# Patient Record
Sex: Male | Born: 1935 | Race: White | Hispanic: No | Marital: Married | Smoking: Former smoker
Health system: Southern US, Community
[De-identification: ages and names within clinical notes are randomized; demographics above are authoritative.]

## PROBLEM LIST (undated history)

## (undated) DIAGNOSIS — I82409 Acute embolism and thrombosis of unspecified deep veins of unspecified lower extremity: Secondary | ICD-10-CM

## (undated) DIAGNOSIS — J449 Chronic obstructive pulmonary disease, unspecified: Secondary | ICD-10-CM

## (undated) DIAGNOSIS — I739 Peripheral vascular disease, unspecified: Secondary | ICD-10-CM

## (undated) DIAGNOSIS — I10 Essential (primary) hypertension: Secondary | ICD-10-CM

## (undated) DIAGNOSIS — I2510 Atherosclerotic heart disease of native coronary artery without angina pectoris: Secondary | ICD-10-CM

## (undated) DIAGNOSIS — I5032 Chronic diastolic (congestive) heart failure: Secondary | ICD-10-CM

## (undated) DIAGNOSIS — N183 Chronic kidney disease, stage 3 unspecified: Secondary | ICD-10-CM

## (undated) DIAGNOSIS — E119 Type 2 diabetes mellitus without complications: Secondary | ICD-10-CM

## (undated) DIAGNOSIS — I639 Cerebral infarction, unspecified: Secondary | ICD-10-CM

## (undated) DIAGNOSIS — I4891 Unspecified atrial fibrillation: Secondary | ICD-10-CM

## (undated) DIAGNOSIS — K219 Gastro-esophageal reflux disease without esophagitis: Secondary | ICD-10-CM

## (undated) HISTORY — PX: PAROTID GLAND TUMOR EXCISION: SHX5221

## (undated) HISTORY — PX: CAROTID STENT: SHX1301

---

## 2013-11-23 ENCOUNTER — Emergency Department (HOSPITAL_COMMUNITY): Payer: Medicare HMO

## 2013-11-23 ENCOUNTER — Emergency Department (HOSPITAL_COMMUNITY): Payer: Medicare HMO | Admitting: Anesthesiology

## 2013-11-23 ENCOUNTER — Encounter (HOSPITAL_COMMUNITY)
Admission: EM | Disposition: A | Discharge status: Skilled Nursing Facility | Payer: Self-pay | Source: Home / Self Care | Attending: Internal Medicine

## 2013-11-23 ENCOUNTER — Inpatient Hospital Stay (HOSPITAL_COMMUNITY)
Admission: EM | Admit: 2013-11-23 | Discharge: 2013-12-05 | DRG: 252 | Disposition: A | Discharge status: Skilled Nursing Facility | Payer: Medicare HMO | Attending: Internal Medicine | Admitting: Internal Medicine

## 2013-11-23 ENCOUNTER — Encounter (HOSPITAL_COMMUNITY): Payer: Self-pay | Admitting: Emergency Medicine

## 2013-11-23 ENCOUNTER — Encounter (HOSPITAL_COMMUNITY): Payer: Medicare HMO | Admitting: Anesthesiology

## 2013-11-23 DIAGNOSIS — J810 Acute pulmonary edema: Secondary | ICD-10-CM

## 2013-11-23 DIAGNOSIS — M79604 Pain in right leg: Secondary | ICD-10-CM

## 2013-11-23 DIAGNOSIS — E669 Obesity, unspecified: Secondary | ICD-10-CM | POA: Diagnosis present

## 2013-11-23 DIAGNOSIS — I69959 Hemiplegia and hemiparesis following unspecified cerebrovascular disease affecting unspecified side: Secondary | ICD-10-CM

## 2013-11-23 DIAGNOSIS — E872 Acidosis, unspecified: Secondary | ICD-10-CM | POA: Diagnosis not present

## 2013-11-23 DIAGNOSIS — K219 Gastro-esophageal reflux disease without esophagitis: Secondary | ICD-10-CM | POA: Diagnosis present

## 2013-11-23 DIAGNOSIS — I5032 Chronic diastolic (congestive) heart failure: Secondary | ICD-10-CM

## 2013-11-23 DIAGNOSIS — I4821 Permanent atrial fibrillation: Secondary | ICD-10-CM

## 2013-11-23 DIAGNOSIS — I498 Other specified cardiac arrhythmias: Secondary | ICD-10-CM | POA: Diagnosis present

## 2013-11-23 DIAGNOSIS — I70229 Atherosclerosis of native arteries of extremities with rest pain, unspecified extremity: Secondary | ICD-10-CM | POA: Diagnosis present

## 2013-11-23 DIAGNOSIS — J4489 Other specified chronic obstructive pulmonary disease: Secondary | ICD-10-CM | POA: Diagnosis present

## 2013-11-23 DIAGNOSIS — J9601 Acute respiratory failure with hypoxia: Secondary | ICD-10-CM

## 2013-11-23 DIAGNOSIS — Z79899 Other long term (current) drug therapy: Secondary | ICD-10-CM

## 2013-11-23 DIAGNOSIS — R5381 Other malaise: Secondary | ICD-10-CM | POA: Diagnosis not present

## 2013-11-23 DIAGNOSIS — B369 Superficial mycosis, unspecified: Secondary | ICD-10-CM | POA: Diagnosis present

## 2013-11-23 DIAGNOSIS — J9620 Acute and chronic respiratory failure, unspecified whether with hypoxia or hypercapnia: Secondary | ICD-10-CM | POA: Diagnosis not present

## 2013-11-23 DIAGNOSIS — Z87891 Personal history of nicotine dependence: Secondary | ICD-10-CM

## 2013-11-23 DIAGNOSIS — Z7982 Long term (current) use of aspirin: Secondary | ICD-10-CM

## 2013-11-23 DIAGNOSIS — I959 Hypotension, unspecified: Secondary | ICD-10-CM

## 2013-11-23 DIAGNOSIS — I2510 Atherosclerotic heart disease of native coronary artery without angina pectoris: Secondary | ICD-10-CM | POA: Diagnosis present

## 2013-11-23 DIAGNOSIS — E118 Type 2 diabetes mellitus with unspecified complications: Secondary | ICD-10-CM | POA: Diagnosis present

## 2013-11-23 DIAGNOSIS — R066 Hiccough: Secondary | ICD-10-CM | POA: Diagnosis not present

## 2013-11-23 DIAGNOSIS — I4891 Unspecified atrial fibrillation: Secondary | ICD-10-CM

## 2013-11-23 DIAGNOSIS — J449 Chronic obstructive pulmonary disease, unspecified: Secondary | ICD-10-CM | POA: Diagnosis present

## 2013-11-23 DIAGNOSIS — I743 Embolism and thrombosis of arteries of the lower extremities: Principal | ICD-10-CM | POA: Diagnosis present

## 2013-11-23 DIAGNOSIS — N170 Acute kidney failure with tubular necrosis: Secondary | ICD-10-CM | POA: Diagnosis not present

## 2013-11-23 DIAGNOSIS — K5900 Constipation, unspecified: Secondary | ICD-10-CM | POA: Diagnosis not present

## 2013-11-23 DIAGNOSIS — E119 Type 2 diabetes mellitus without complications: Secondary | ICD-10-CM | POA: Diagnosis present

## 2013-11-23 DIAGNOSIS — R339 Retention of urine, unspecified: Secondary | ICD-10-CM | POA: Diagnosis not present

## 2013-11-23 DIAGNOSIS — I10 Essential (primary) hypertension: Secondary | ICD-10-CM | POA: Diagnosis present

## 2013-11-23 DIAGNOSIS — I359 Nonrheumatic aortic valve disorder, unspecified: Secondary | ICD-10-CM | POA: Diagnosis present

## 2013-11-23 DIAGNOSIS — I998 Other disorder of circulatory system: Secondary | ICD-10-CM

## 2013-11-23 DIAGNOSIS — Z8673 Personal history of transient ischemic attack (TIA), and cerebral infarction without residual deficits: Secondary | ICD-10-CM

## 2013-11-23 DIAGNOSIS — Z6833 Body mass index (BMI) 33.0-33.9, adult: Secondary | ICD-10-CM

## 2013-11-23 DIAGNOSIS — R319 Hematuria, unspecified: Secondary | ICD-10-CM | POA: Diagnosis not present

## 2013-11-23 DIAGNOSIS — I999 Unspecified disorder of circulatory system: Secondary | ICD-10-CM

## 2013-11-23 DIAGNOSIS — I5033 Acute on chronic diastolic (congestive) heart failure: Secondary | ICD-10-CM | POA: Diagnosis not present

## 2013-11-23 DIAGNOSIS — R579 Shock, unspecified: Secondary | ICD-10-CM | POA: Diagnosis not present

## 2013-11-23 HISTORY — DX: Gastro-esophageal reflux disease without esophagitis: K21.9

## 2013-11-23 HISTORY — DX: Type 2 diabetes mellitus without complications: E11.9

## 2013-11-23 HISTORY — PX: EMBOLECTOMY: SHX44

## 2013-11-23 HISTORY — DX: Essential (primary) hypertension: I10

## 2013-11-23 HISTORY — DX: Unspecified atrial fibrillation: I48.91

## 2013-11-23 HISTORY — DX: Cerebral infarction, unspecified: I63.9

## 2013-11-23 HISTORY — DX: Chronic obstructive pulmonary disease, unspecified: J44.9

## 2013-11-23 LAB — GLUCOSE, CAPILLARY: Glucose-Capillary: 57 mg/dL — ABNORMAL LOW (ref 70–99)

## 2013-11-23 SURGERY — EMBOLECTOMY
Anesthesia: General | Site: Groin | Laterality: Right

## 2013-11-23 MED ORDER — DILTIAZEM LOAD VIA INFUSION
10.0000 mg | Freq: Once | INTRAVENOUS | Status: AC
  Administered 2013-11-24: 10 mg via INTRAVENOUS
  Filled 2013-11-23: qty 10

## 2013-11-23 MED ORDER — DILTIAZEM HCL 100 MG IV SOLR
5.0000 mg/h | INTRAVENOUS | Status: DC
  Administered 2013-11-24: 10 mg/h via INTRAVENOUS
  Filled 2013-11-23: qty 100

## 2013-11-23 MED ORDER — DEXTROSE 50 % IV SOLN
INTRAVENOUS | Status: DC | PRN
  Administered 2013-11-23: .5 via INTRAVENOUS

## 2013-11-23 MED ORDER — SUCCINYLCHOLINE CHLORIDE 20 MG/ML IJ SOLN
INTRAMUSCULAR | Status: DC | PRN
  Administered 2013-11-23: 130 mg via INTRAVENOUS

## 2013-11-23 MED ORDER — DEXTROSE 50 % IV SOLN
INTRAVENOUS | Status: AC
  Filled 2013-11-23: qty 50

## 2013-11-23 MED ORDER — PROPOFOL 10 MG/ML IV BOLUS
INTRAVENOUS | Status: DC | PRN
  Administered 2013-11-23: 100 mg via INTRAVENOUS

## 2013-11-23 MED ORDER — SODIUM CHLORIDE 0.9 % IR SOLN
Status: DC | PRN
  Administered 2013-11-23: 23:00:00

## 2013-11-23 MED ORDER — CEFAZOLIN SODIUM-DEXTROSE 2-3 GM-% IV SOLR
INTRAVENOUS | Status: DC | PRN
  Administered 2013-11-23: 2 g via INTRAVENOUS

## 2013-11-23 MED ORDER — CEFAZOLIN SODIUM-DEXTROSE 2-3 GM-% IV SOLR
INTRAVENOUS | Status: AC
  Filled 2013-11-23: qty 50

## 2013-11-23 MED ORDER — IPRATROPIUM-ALBUTEROL 20-100 MCG/ACT IN AERS
INHALATION_SPRAY | RESPIRATORY_TRACT | Status: DC | PRN
  Administered 2013-11-23: 4 via RESPIRATORY_TRACT

## 2013-11-23 MED ORDER — 0.9 % SODIUM CHLORIDE (POUR BTL) OPTIME
TOPICAL | Status: DC | PRN
  Administered 2013-11-23: 1000 mL

## 2013-11-23 MED ORDER — HEPARIN SODIUM (PORCINE) 1000 UNIT/ML IJ SOLN
INTRAMUSCULAR | Status: DC | PRN
  Administered 2013-11-23: 8000 [IU] via INTRAVENOUS

## 2013-11-23 MED ORDER — ROCURONIUM BROMIDE 100 MG/10ML IV SOLN
INTRAVENOUS | Status: DC | PRN
  Administered 2013-11-23 – 2013-11-24 (×2): 25 mg via INTRAVENOUS

## 2013-11-23 MED ORDER — SODIUM CHLORIDE 0.9 % IV SOLN
10.0000 mg | INTRAVENOUS | Status: DC | PRN
  Administered 2013-11-23: 25 ug/min via INTRAVENOUS

## 2013-11-23 MED ORDER — LACTATED RINGERS IV SOLN
INTRAVENOUS | Status: DC | PRN
  Administered 2013-11-23: 23:00:00 via INTRAVENOUS

## 2013-11-23 MED ORDER — LIDOCAINE HCL (CARDIAC) 20 MG/ML IV SOLN
INTRAVENOUS | Status: DC | PRN
  Administered 2013-11-23: 80 mg via INTRAVENOUS

## 2013-11-23 MED ORDER — MIDAZOLAM HCL 5 MG/5ML IJ SOLN
INTRAMUSCULAR | Status: DC | PRN
  Administered 2013-11-23 – 2013-11-24 (×2): 1 mg via INTRAVENOUS

## 2013-11-23 MED ORDER — FENTANYL CITRATE 0.05 MG/ML IJ SOLN
INTRAMUSCULAR | Status: DC | PRN
  Administered 2013-11-23: 50 ug via INTRAVENOUS

## 2013-11-23 MED ORDER — THROMBIN 20000 UNITS EX SOLR
CUTANEOUS | Status: AC
  Filled 2013-11-23: qty 20000

## 2013-11-23 SURGICAL SUPPLY — 58 items
BANDAGE ESMARK 6X9 LF (GAUZE/BANDAGES/DRESSINGS) IMPLANT
BNDG ESMARK 6X9 LF (GAUZE/BANDAGES/DRESSINGS)
CANISTER SUCTION 2500CC (MISCELLANEOUS) ×2 IMPLANT
CATH EMB 3FR 80CM (CATHETERS) IMPLANT
CATH EMB 4FR 80CM (CATHETERS) ×2 IMPLANT
CATH EMB 5FR 80CM (CATHETERS) IMPLANT
CLIP TI MEDIUM 24 (CLIP) ×2 IMPLANT
CLIP TI WIDE RED SMALL 24 (CLIP) ×2 IMPLANT
COVER SURGICAL LIGHT HANDLE (MISCELLANEOUS) ×2 IMPLANT
CUFF TOURNIQUET SINGLE 18IN (TOURNIQUET CUFF) IMPLANT
CUFF TOURNIQUET SINGLE 24IN (TOURNIQUET CUFF) IMPLANT
CUFF TOURNIQUET SINGLE 34IN LL (TOURNIQUET CUFF) IMPLANT
CUFF TOURNIQUET SINGLE 44IN (TOURNIQUET CUFF) IMPLANT
DERMABOND ADHESIVE PROPEN (GAUZE/BANDAGES/DRESSINGS) ×1
DERMABOND ADVANCED (GAUZE/BANDAGES/DRESSINGS) ×1
DERMABOND ADVANCED .7 DNX12 (GAUZE/BANDAGES/DRESSINGS) ×1 IMPLANT
DERMABOND ADVANCED .7 DNX6 (GAUZE/BANDAGES/DRESSINGS) ×1 IMPLANT
DRAIN CHANNEL 15F RND FF W/TCR (WOUND CARE) ×2 IMPLANT
DRAPE WARM FLUID 44X44 (DRAPE) ×2 IMPLANT
DRAPE X-RAY CASS 24X20 (DRAPES) IMPLANT
DRESSING ALLEVYN LIFE SACRUM (GAUZE/BANDAGES/DRESSINGS) ×2 IMPLANT
ELECT REM PT RETURN 9FT ADLT (ELECTROSURGICAL) ×2
ELECTRODE REM PT RTRN 9FT ADLT (ELECTROSURGICAL) ×1 IMPLANT
EVACUATOR SILICONE 100CC (DRAIN) ×2 IMPLANT
GLOVE BIO SURGEON STRL SZ7.5 (GLOVE) ×6 IMPLANT
GLOVE BIOGEL PI IND STRL 6.5 (GLOVE) ×3 IMPLANT
GLOVE BIOGEL PI IND STRL 7.5 (GLOVE) ×2 IMPLANT
GLOVE BIOGEL PI IND STRL 8 (GLOVE) ×1 IMPLANT
GLOVE BIOGEL PI INDICATOR 6.5 (GLOVE) ×3
GLOVE BIOGEL PI INDICATOR 7.5 (GLOVE) ×2
GLOVE BIOGEL PI INDICATOR 8 (GLOVE) ×1
GOWN STRL NON-REIN LRG LVL3 (GOWN DISPOSABLE) ×6 IMPLANT
KIT BASIN OR (CUSTOM PROCEDURE TRAY) ×2 IMPLANT
KIT ROOM TURNOVER OR (KITS) ×2 IMPLANT
NS IRRIG 1000ML POUR BTL (IV SOLUTION) ×4 IMPLANT
PACK PERIPHERAL VASCULAR (CUSTOM PROCEDURE TRAY) ×2 IMPLANT
PAD ARMBOARD 7.5X6 YLW CONV (MISCELLANEOUS) ×4 IMPLANT
PATCH VASCULAR VASCU GUARD 1X6 (Vascular Products) ×2 IMPLANT
SET COLLECT BLD 21X3/4 12 (NEEDLE) IMPLANT
SPONGE GAUZE 4X4 12PLY (GAUZE/BANDAGES/DRESSINGS) ×2 IMPLANT
SPONGE SURGIFOAM ABS GEL 100 (HEMOSTASIS) IMPLANT
STAPLER VISISTAT 35W (STAPLE) IMPLANT
STOPCOCK 4 WAY LG BORE MALE ST (IV SETS) IMPLANT
SUT ETHILON 3 0 PS 1 (SUTURE) ×2 IMPLANT
SUT PROLENE 5 0 C 1 24 (SUTURE) ×8 IMPLANT
SUT PROLENE 6 0 BV (SUTURE) IMPLANT
SUT VIC AB 2-0 CTB1 (SUTURE) ×4 IMPLANT
SUT VIC AB 3-0 SH 27 (SUTURE) ×1
SUT VIC AB 3-0 SH 27X BRD (SUTURE) ×1 IMPLANT
SUT VICRYL 4-0 PS2 18IN ABS (SUTURE) ×2 IMPLANT
SYR 3ML LL SCALE MARK (SYRINGE) ×2 IMPLANT
TAPE CLOTH SURG 4X10 WHT LF (GAUZE/BANDAGES/DRESSINGS) ×2 IMPLANT
TOWEL OR 17X24 6PK STRL BLUE (TOWEL DISPOSABLE) ×4 IMPLANT
TOWEL OR 17X26 10 PK STRL BLUE (TOWEL DISPOSABLE) ×2 IMPLANT
TRAY FOLEY CATH 16FRSI W/METER (SET/KITS/TRAYS/PACK) ×2 IMPLANT
TUBING EXTENTION W/L.L. (IV SETS) IMPLANT
UNDERPAD 30X30 INCONTINENT (UNDERPADS AND DIAPERS) ×2 IMPLANT
WATER STERILE IRR 1000ML POUR (IV SOLUTION) ×2 IMPLANT

## 2013-11-23 NOTE — Anesthesia Preprocedure Evaluation (Addendum)
Anesthesia Evaluation  Patient identified by MRN, date of birth, ID band Patient awake    Reviewed: Allergy & Precautions, H&P , NPO status , Patient's Chart, lab work & pertinent test results  Airway Mallampati: II TM Distance: >3 FB Neck ROM: Full    Dental   Pulmonary COPDformer smoker,  + rhonchi   + wheezing      Cardiovascular hypertension, + Peripheral Vascular Disease + dysrhythmias Atrial Fibrillation Rhythm:Irregular Rate:Tachycardia     Neuro/Psych CVA, Residual Symptoms    GI/Hepatic GERD-  ,  Endo/Other  diabetes  Renal/GU      Musculoskeletal   Abdominal (+) + obese,   Peds  Hematology   Anesthesia Other Findings   Reproductive/Obstetrics                          Anesthesia Physical Anesthesia Plan  ASA: IV and emergent  Anesthesia Plan: General   Post-op Pain Management:    Induction: Intravenous, Rapid sequence and Cricoid pressure planned  Airway Management Planned: Oral ETT  Additional Equipment:   Intra-op Plan:   Post-operative Plan: Extubation in OR and Possible Post-op intubation/ventilation  Informed Consent: I have reviewed the patients History and Physical, chart, labs and discussed the procedure including the risks, benefits and alternatives for the proposed anesthesia with the patient or authorized representative who has indicated his/her understanding and acceptance.     Plan Discussed with: CRNA and Surgeon  Anesthesia Plan Comments:        Anesthesia Quick Evaluation

## 2013-11-23 NOTE — ED Notes (Signed)
Dr. Dickson at bedside.

## 2013-11-23 NOTE — H&P (Signed)
Vascular and Vein Specialist of [facility]  Patient name: [name] [name] MRN: [id number] DOB: 14-Sep-1931 Sex: male  REASON FOR ADMISSION: Ischemic right lower extremity. The patient is transferred from [facility].  HPI: [name] [name] is a 79 y.o. male who developed the sudden onset of weakness and discoloration of his right lower extremity at 9:30 AM yesterday. His wife noted that the right leg was white from the knee down and that it was a weak and numb. Of note, the patient has a history of atrial fibrillation. He has not been on anticoagulation. He was admitted at [facility]. It was felt that he likely had a left brain stroke and he underwent an extensive workup including carotid duplex, CT, and MRI. The stroke workup was negative. Today he had an arterial Doppler study which showed no significant arterial flow in the right lower extremity. There was no flow noted in the right common femoral artery were superficial femoral artery. There was significant calcific disease in the superficial femoral artery. He also had a venous duplex scan which showed no evidence of DVT in the right lower extremity. Given that he likely had an acute arterial occlusion vascular surgery was consult the and he was transferred here for evaluation.  Prior to this admission he was ambulatory with a walker. He he admitted to some calf pain with ambulation which could be claudication. He also complained of some pain in his right foot at rest which could potentially represent rest pain. He was unaware of any previous history of nonhealing ulcers. He did have a previous stroke in 1997 which was apparently associated with left-sided weakness. He has undergone previous right carotid endarterectomy in another city and also previous right carotid stenting.  His risk factors for peripheral vascular disease include hypertension, diabetes, and a history of previous tobacco use.  Because of his atrial fibrillation, at the  referring institution he was started on Xarelto yesterday.  Past Medical History  Diagnosis Date  . Hypertension   . Stroke   . COPD (chronic obstructive pulmonary disease)   . Diabetes mellitus without complication   . GERD (gastroesophageal reflux disease)   . A-fib    FAMILY HISTORY: Patient denies any history of premature cardiovascular disease.  SOCIAL HISTORY: History  Substance Use Topics  . Smoking status: Former Games developer  . Smokeless tobacco: Not on file  . Alcohol Use: No   No Known Allergies  No current facility-administered medications for this encounter.   Current Outpatient Prescriptions  Medication Sig Dispense Refill  . aspirin EC 81 MG tablet Take 81 mg by mouth at bedtime.      Marland Kitchen atorvastatin (LIPITOR) 10 MG tablet Take 10 mg by mouth daily.      . benazepril (LOTENSIN) 10 MG tablet Take 10 mg by mouth 2 (two) times daily.      . furosemide (LASIX) 40 MG tablet Take 60 mg by mouth daily.      Marland Kitchen glipiZIDE (GLUCOTROL XL) 10 MG 24 hr tablet Take 10 mg by mouth 2 (two) times daily.      . metoprolol (LOPRESSOR) 50 MG tablet Take 50 mg by mouth 2 (two) times daily.      . potassium chloride SA (K-DUR,KLOR-CON) 20 MEQ tablet Take 20 mEq by mouth daily.      . ranitidine (ZANTAC) 150 MG tablet Take 150 mg by mouth 2 (two) times daily.      . sitaGLIPtin-metformin (JANUMET) 50-500 MG per tablet Take 1 tablet by mouth  2 (two) times daily with a meal.      . terazosin (HYTRIN) 5 MG capsule Take 5 mg by mouth every other day.       REVIEW OF SYSTEMS: Arly.Keller ] denotes positive finding; [  ] denotes negative finding CARDIOVASCULAR:  [ ]  chest pain   [ ]  chest pressure   [ ]  palpitations   [ ]  orthopnea   Arly.Keller ] dyspnea on exertion   Arly.Keller ] claudication   Arly.Keller ] rest pain   [ ]  DVT   [ ]  phlebitis PULMONARY:   [ ]  productive cough   [ ]  asthma   [ ]  wheezing NEUROLOGIC:   Arly.Keller ] weakness  Arly.Keller ] paresthesias  [ ]  aphasia  [ ]  amaurosis  [ ]  dizziness HEMATOLOGIC:   [ ]  bleeding problems    [ ]  clotting disorders MUSCULOSKELETAL:  Arly.Keller ] joint pain   [ ]  joint swelling [ ]  leg swelling GASTROINTESTINAL: [ ]   blood in stool  [ ]   hematemesis GENITOURINARY:  [ ]   dysuria  [ ]   hematuria PSYCHIATRIC:  [ ]  history of major depression INTEGUMENTARY:  [ ]  rashes  [ ]  ulcers CONSTITUTIONAL:  [ ]  fever   [ ]  chills  PHYSICAL EXAM: Filed Vitals:   11/23/13 2050 11/23/13 2053  BP:  100/53  Pulse:  115  Temp:  97.5 F (36.4 C)  TempSrc:  Oral  Resp:  18  Height:  5\' 7"  (1.702 m)  Weight:  197 lb (89.359 kg)  SpO2: 98% 100%   Body mass index is 30.85 kg/(m^2). GENERAL: The patient is a well-nourished male, in no acute distress. The vital signs are documented above. CARDIOVASCULAR: There is a regular rate and rhythm. I do not detect carotid bruits. I cannot palpate a right femoral pulse. I cannot palpate a popliteal or pedal pulses on the right. I am unable to obtain Doppler signals in the right foot. The right foot is cool. On the left side, he has a palpable femoral pulse although somewhat diminished. He has a monophasic dorsalis pedis and posterior tibial signal on the left with the Doppler. He has bilateral lower extremity swelling. PULMONARY: There is good air exchange bilaterally without wheezing or rales. ABDOMEN: Soft and non-tender with normal pitched bowel sounds. He is significantly obese and it is difficult to assess for an aneurysm. MUSCULOSKELETAL: The right foot is cool and cyanotic. NEUROLOGIC:He has decreased motor and sensory function of the right lower extremity. He has left sided weakness related to his previous stroke. SKIN: He does have some fungal rash in both groins. PSYCHIATRIC: The patient has a normal affect.  DATA:  RIGHT LOWER EXTREMITY ARTERIAL DUPLEX: This shows no significant arterial flow in the right lower extremity. There is no flow identified in the right common femoral artery, superficial femoral artery, or popliteal artery. There is calcific  disease within the superficial femoral artery.  VENOUS DUPLEX: There is no evidence of right lower extremity DVT.  CAROTID DUPLEX SCAN: There is a less than 50% carotid stenosis bilaterally.  Labs at the referring institution show white blood cell count of 16.1. Hemoglobin 12.6 hematocrit 39.3. Platelets 175,000. INR 1.2. PTT 23.6. Sodium was 138, potassium 4.9, creatinine 2.1. GFR 31 mL per minute.  Chest x-ray at the referring institution showed atelectasis bilaterally. He also had cardiomegaly and pulmonary venous congestion without frank evidence of edema.  EKG at [facility] showed ectopic atrial tachycardia with an undetermined rhythm which was irregular.  CT of the head at [facility] on 11/22/2013 showed diffuse cortical atrophy. There is no acute intracranial abnormality seen.  MRI of the head on 11/22/2013 at [facility] showed no acute abnormality.  MEDICAL ISSUES:  ACUTE ARTERIAL OCCLUSION OF THE RIGHT LOWER EXTREMITY: This patient developed the sudden onset of discoloration and weakness in the right lower extremity. His workup for stroke was negative. Duplex showed no significant arterial flow in the right lower extremity. Given his history of atrial fibrillation certainly an embolic event is on the differential diagnosis. However he has multiple risk factors for peripheral vascular disease and may have underlying significant peripheral vascular disease. Given the severity of ischemia which is been present since 9:30 AM yesterday, I have recommended emergent right femoral embolectomy. If this becomes more than a simple embolectomy of the right femoral artery, then I would be reluctant to consider intraoperative arteriogram given his renal insufficiency and the risk of worsening his renal function. He may have significant diffuse atherosclerotic disease in which case the limb may not be salvageable. He certainly is at increased risk for surgery given his age and  multiple medical comorbidities. Or I recommended that we attempt simple thrombectomy as the best chance for limb salvage with the least amount of risk. I discussed indications for the procedure and the potential complications including but not limited to, bleeding, wound healing problems, and it not being able to restore perfusion to the right foot. I have explained that this is clearly a limb threatening situation. He will need an echo postoperatively if this appears to be an embolic. He will have to be considered for long-term anticoagulation if this is considered to be embolic. Given his multiple medical comorbidities he will need to be seen by [facility] postoperatively.  Eliza Green S [facility] [place] [facility]: [phone]

## 2013-11-23 NOTE — Anesthesia Procedure Notes (Signed)
Procedure Name: Intubation Date/Time: 11/23/2013 10:52 PM Performed by: Molli Hazard Pre-anesthesia Checklist: Patient identified, Emergency Drugs available, Suction available, Patient being monitored and Timeout performed Patient Re-evaluated:Patient Re-evaluated prior to inductionOxygen Delivery Method: Circle system utilized Preoxygenation: Pre-oxygenation with 100% oxygen Intubation Type: IV induction, Rapid sequence and Cricoid Pressure applied Laryngoscope Size: Miller and 2 Grade View: Grade I Tube type: Oral Tube size: 7.5 mm Number of attempts: 1 Airway Equipment and Method: Stylet Placement Confirmation: ETT inserted through vocal cords under direct vision,  positive ETCO2 and breath sounds checked- equal and bilateral Secured at: 23 cm Tube secured with: Tape Dental Injury: Teeth and Oropharynx as per pre-operative assessment

## 2013-11-23 NOTE — ED Notes (Signed)
PORT CXR at bedside

## 2013-11-23 NOTE — ED Notes (Signed)
Pt reports that prior to hospital admission he would have cramps in his R leg at night when he would lay down.

## 2013-11-23 NOTE — ED Notes (Addendum)
Pt transferred from [place] hospital. Was being tx due to R lower extremity being pale and unable to move. Had stroke work up which was negative. Hx past stroke 17 years ago with L sided deficits.  Pt family also reports that he would turn blue in the ears. Pt transferred for possible surgery. Facility also reports abdominal distention over last day.

## 2013-11-24 ENCOUNTER — Inpatient Hospital Stay (HOSPITAL_COMMUNITY): Payer: Medicare HMO

## 2013-11-24 ENCOUNTER — Encounter (HOSPITAL_COMMUNITY): Payer: Self-pay | Admitting: Vascular Surgery

## 2013-11-24 DIAGNOSIS — I743 Embolism and thrombosis of arteries of the lower extremities: Secondary | ICD-10-CM

## 2013-11-24 DIAGNOSIS — I998 Other disorder of circulatory system: Secondary | ICD-10-CM

## 2013-11-24 DIAGNOSIS — I959 Hypotension, unspecified: Secondary | ICD-10-CM

## 2013-11-24 DIAGNOSIS — I4821 Permanent atrial fibrillation: Secondary | ICD-10-CM

## 2013-11-24 DIAGNOSIS — J9600 Acute respiratory failure, unspecified whether with hypoxia or hypercapnia: Secondary | ICD-10-CM

## 2013-11-24 DIAGNOSIS — J9601 Acute respiratory failure with hypoxia: Secondary | ICD-10-CM

## 2013-11-24 DIAGNOSIS — I4891 Unspecified atrial fibrillation: Secondary | ICD-10-CM

## 2013-11-24 LAB — MRSA PCR SCREENING: MRSA by PCR: NEGATIVE

## 2013-11-24 LAB — POCT I-STAT 3, ART BLOOD GAS (G3+)
Acid-base deficit: 3 mmol/L — ABNORMAL HIGH (ref 0.0–2.0)
Bicarbonate: 24.3 mEq/L — ABNORMAL HIGH (ref 20.0–24.0)
Bicarbonate: 22.9 mEq/L (ref 20.0–24.0)
O2 Saturation: 99 %
Patient temperature: 97.5
TCO2: 26 mmol/L (ref 0–100)
TCO2: 24 mmol/L (ref 0–100)
pCO2 arterial: 49.8 mmHg — ABNORMAL HIGH (ref 35.0–45.0)
pCO2 arterial: 38.1 mmHg (ref 35.0–45.0)
pH, Arterial: 7.384 (ref 7.350–7.450)
pO2, Arterial: 112 mmHg — ABNORMAL HIGH (ref 80.0–100.0)

## 2013-11-24 LAB — POCT I-STAT GLUCOSE
Glucose, Bld: 48 mg/dL — ABNORMAL LOW (ref 70–99)
Operator id: 118551

## 2013-11-24 LAB — CBC
Hemoglobin: 11.3 g/dL — ABNORMAL LOW (ref 13.0–17.0)
MCH: 31.7 pg (ref 26.0–34.0)
MCV: 96.4 fL (ref 78.0–100.0)
RBC: 3.57 MIL/uL — ABNORMAL LOW (ref 4.22–5.81)
RDW: 14.2 % (ref 11.5–15.5)
WBC: 7.9 10*3/uL (ref 4.0–10.5)

## 2013-11-24 LAB — GLUCOSE, CAPILLARY
Glucose-Capillary: 78 mg/dL (ref 70–99)
Glucose-Capillary: 125 mg/dL — ABNORMAL HIGH (ref 70–99)
Glucose-Capillary: 154 mg/dL — ABNORMAL HIGH (ref 70–99)
Glucose-Capillary: 95 mg/dL (ref 70–99)
Glucose-Capillary: 111 mg/dL — ABNORMAL HIGH (ref 70–99)
Glucose-Capillary: 140 mg/dL — ABNORMAL HIGH (ref 70–99)

## 2013-11-24 LAB — BASIC METABOLIC PANEL
CO2: 23 mEq/L (ref 19–32)
GFR calc non Af Amer: 32 mL/min — ABNORMAL LOW (ref >90)
Glucose, Bld: 213 mg/dL — ABNORMAL HIGH (ref 70–99)
Potassium: 4.2 mEq/L (ref 3.5–5.1)
Sodium: 139 mEq/L (ref 135–145)

## 2013-11-24 LAB — HEPARIN LEVEL (UNFRACTIONATED): Heparin Unfractionated: 1.26 IU/mL — ABNORMAL HIGH (ref 0.30–0.70)

## 2013-11-24 LAB — APTT
aPTT: 77 seconds — ABNORMAL HIGH (ref 24–37)
aPTT: 49 seconds — ABNORMAL HIGH (ref 24–37)

## 2013-11-24 MED ORDER — MIDAZOLAM HCL 2 MG/2ML IJ SOLN: 1.0000 mg | INTRAMUSCULAR | Status: DC | PRN

## 2013-11-24 MED ORDER — GUAIFENESIN-DM 100-10 MG/5ML PO SYRP
15.0000 mL | ORAL_SOLUTION | ORAL | Status: DC | PRN
  Filled 2013-11-24: qty 15

## 2013-11-24 MED ORDER — HEPARIN (PORCINE) IN NACL 100-0.45 UNIT/ML-% IJ SOLN
1900.0000 [IU]/h | INTRAMUSCULAR | Status: DC
  Administered 2013-11-24: 1150 [IU]/h via INTRAVENOUS
  Administered 2013-11-25: 1300 [IU]/h via INTRAVENOUS
  Administered 2013-11-27 (×2): 1600 [IU]/h via INTRAVENOUS
  Administered 2013-11-28: 1750 [IU]/h via INTRAVENOUS
  Administered 2013-11-28: 1900 [IU]/h via INTRAVENOUS
  Filled 2013-11-24 (×9): qty 250

## 2013-11-24 MED ORDER — DOPAMINE-DEXTROSE 3.2-5 MG/ML-% IV SOLN: 3.0000 ug/kg/min | INTRAVENOUS | Status: DC

## 2013-11-24 MED ORDER — ALUM & MAG HYDROXIDE-SIMETH 200-200-20 MG/5ML PO SUSP
15.0000 mL | ORAL | Status: DC | PRN
Start: 2013-11-24 — End: 2013-12-05
  Filled 2013-11-24: qty 30

## 2013-11-24 MED ORDER — INSULIN ASPART 100 UNIT/ML ~~LOC~~ SOLN
0.0000 [IU] | SUBCUTANEOUS | Status: DC
  Administered 2013-11-24: 2 [IU] via SUBCUTANEOUS

## 2013-11-24 MED ORDER — HYDRALAZINE HCL 20 MG/ML IJ SOLN: 10.0000 mg | INTRAMUSCULAR | Status: DC | PRN

## 2013-11-24 MED ORDER — METOPROLOL TARTRATE 1 MG/ML IV SOLN
2.5000 mg | INTRAVENOUS | Status: DC | PRN
  Administered 2013-11-24 (×2): 5 mg via INTRAVENOUS
  Filled 2013-11-24 (×2): qty 5

## 2013-11-24 MED ORDER — PHENYLEPHRINE HCL 10 MG/ML IJ SOLN
30.0000 ug/min | INTRAVENOUS | Status: DC
  Administered 2013-11-24: 30 ug/min via INTRAVENOUS
  Filled 2013-11-24 (×2): qty 2

## 2013-11-24 MED ORDER — DEXTROSE-NACL 5-0.9 % IV SOLN
INTRAVENOUS | Status: DC
  Administered 2013-11-24: 03:00:00 via INTRAVENOUS

## 2013-11-24 MED ORDER — ALBUTEROL SULFATE (5 MG/ML) 0.5% IN NEBU
2.5000 mg | INHALATION_SOLUTION | Freq: Four times a day (QID) | RESPIRATORY_TRACT | Status: DC
  Administered 2013-11-24 – 2013-11-25 (×4): 2.5 mg via RESPIRATORY_TRACT
  Filled 2013-11-24 (×5): qty 0.5

## 2013-11-24 MED ORDER — IPRATROPIUM BROMIDE 0.02 % IN SOLN
0.5000 mg | RESPIRATORY_TRACT | Status: DC
  Administered 2013-11-24 (×2): 0.5 mg via RESPIRATORY_TRACT
  Filled 2013-11-24 (×2): qty 2.5

## 2013-11-24 MED ORDER — CHLORHEXIDINE GLUCONATE 0.12 % MT SOLN
15.0000 mL | Freq: Two times a day (BID) | OROMUCOSAL | Status: DC
  Administered 2013-11-24: 15 mL via OROMUCOSAL
  Filled 2013-11-24: qty 15

## 2013-11-24 MED ORDER — ACETAMINOPHEN 650 MG RE SUPP: 325.0000 mg | RECTAL | Status: DC | PRN

## 2013-11-24 MED ORDER — FENTANYL CITRATE 0.05 MG/ML IJ SOLN: 12.5000 ug | INTRAMUSCULAR | Status: DC | PRN

## 2013-11-24 MED ORDER — HEPARIN (PORCINE) IN NACL 100-0.45 UNIT/ML-% IJ SOLN
1000.0000 [IU]/h | INTRAMUSCULAR | Status: DC
  Administered 2013-11-24: 1000 [IU]/h via INTRAVENOUS
  Filled 2013-11-24 (×2): qty 250

## 2013-11-24 MED ORDER — FENTANYL CITRATE 0.05 MG/ML IJ SOLN
25.0000 ug | INTRAMUSCULAR | Status: DC | PRN
  Filled 2013-11-24: qty 2

## 2013-11-24 MED ORDER — INSULIN ASPART 100 UNIT/ML ~~LOC~~ SOLN
0.0000 [IU] | SUBCUTANEOUS | Status: DC
  Administered 2013-11-24: 4 [IU] via SUBCUTANEOUS
  Administered 2013-11-24: 3 [IU] via SUBCUTANEOUS

## 2013-11-24 MED ORDER — METOPROLOL TARTRATE 1 MG/ML IV SOLN: 2.0000 mg | INTRAVENOUS | Status: DC | PRN

## 2013-11-24 MED ORDER — OXYCODONE HCL 5 MG PO TABS: 5.0000 mg | ORAL_TABLET | Freq: Once | ORAL | Status: DC | PRN

## 2013-11-24 MED ORDER — ACETAMINOPHEN 325 MG PO TABS
325.0000 mg | ORAL_TABLET | ORAL | Status: DC | PRN
Start: 2013-11-24 — End: 2013-12-05

## 2013-11-24 MED ORDER — FENTANYL BOLUS VIA INFUSION
25.0000 ug | INTRAVENOUS | Status: DC | PRN
  Filled 2013-11-24: qty 50

## 2013-11-24 MED ORDER — ONDANSETRON HCL 4 MG/2ML IJ SOLN
4.0000 mg | Freq: Four times a day (QID) | INTRAMUSCULAR | Status: DC | PRN
  Filled 2013-11-24: qty 2

## 2013-11-24 MED ORDER — PHENYLEPHRINE HCL 10 MG/ML IJ SOLN
30.0000 ug/min | INTRAVENOUS | Status: DC
  Filled 2013-11-24: qty 4

## 2013-11-24 MED ORDER — HEPARIN BOLUS VIA INFUSION
3000.0000 [IU] | Freq: Once | INTRAVENOUS | Status: AC
  Administered 2013-11-24: 3000 [IU] via INTRAVENOUS
  Filled 2013-11-24: qty 3000

## 2013-11-24 MED ORDER — PANTOPRAZOLE SODIUM 40 MG PO TBEC: 40.0000 mg | DELAYED_RELEASE_TABLET | Freq: Every day | ORAL | Status: DC

## 2013-11-24 MED ORDER — PHENOL 1.4 % MT LIQD: 1.0000 | OROMUCOSAL | Status: DC | PRN

## 2013-11-24 MED ORDER — SODIUM CHLORIDE 0.9 % IV SOLN: 500.0000 mL | Freq: Once | INTRAVENOUS | Status: AC | PRN

## 2013-11-24 MED ORDER — SODIUM CHLORIDE 0.9 % IV BOLUS (SEPSIS)
500.0000 mL | Freq: Once | INTRAVENOUS | Status: AC
  Administered 2013-11-24: 500 mL via INTRAVENOUS

## 2013-11-24 MED ORDER — THROMBIN 20000 UNITS EX KIT
PACK | CUTANEOUS | Status: DC | PRN
  Administered 2013-11-24: via TOPICAL

## 2013-11-24 MED ORDER — MORPHINE SULFATE 2 MG/ML IJ SOLN: 2.0000 mg | INTRAMUSCULAR | Status: DC | PRN

## 2013-11-24 MED ORDER — METOPROLOL TARTRATE 1 MG/ML IV SOLN
5.0000 mg | Freq: Four times a day (QID) | INTRAVENOUS | Status: DC
  Administered 2013-11-24 – 2013-11-25 (×4): 5 mg via INTRAVENOUS
  Filled 2013-11-24 (×8): qty 5

## 2013-11-24 MED ORDER — ALBUTEROL SULFATE (5 MG/ML) 0.5% IN NEBU
2.5000 mg | INHALATION_SOLUTION | RESPIRATORY_TRACT | Status: DC
  Administered 2013-11-24 (×2): 2.5 mg via RESPIRATORY_TRACT
  Filled 2013-11-24 (×2): qty 0.5

## 2013-11-24 MED ORDER — PANTOPRAZOLE SODIUM 40 MG IV SOLR
40.0000 mg | Freq: Every day | INTRAVENOUS | Status: DC
  Administered 2013-11-24 – 2013-11-26 (×3): 40 mg via INTRAVENOUS
  Filled 2013-11-24 (×4): qty 40

## 2013-11-24 MED ORDER — SODIUM BICARBONATE 8.4 % IV SOLN
INTRAVENOUS | Status: DC | PRN
  Administered 2013-11-24: 50 meq via INTRAVENOUS

## 2013-11-24 MED ORDER — FENTANYL CITRATE 0.05 MG/ML IJ SOLN
50.0000 ug | Freq: Once | INTRAMUSCULAR | Status: AC
  Administered 2013-11-24: 50 ug via INTRAVENOUS

## 2013-11-24 MED ORDER — LABETALOL HCL 5 MG/ML IV SOLN: 10.0000 mg | INTRAVENOUS | Status: DC | PRN

## 2013-11-24 MED ORDER — POTASSIUM CHLORIDE CRYS ER 20 MEQ PO TBCR: 20.0000 meq | EXTENDED_RELEASE_TABLET | Freq: Once | ORAL | Status: DC | PRN

## 2013-11-24 MED ORDER — OXYCODONE-ACETAMINOPHEN 5-325 MG PO TABS
1.0000 | ORAL_TABLET | ORAL | Status: DC | PRN
Start: 2013-11-24 — End: 2013-11-24

## 2013-11-24 MED ORDER — PROPOFOL INFUSION 10 MG/ML OPTIME
INTRAVENOUS | Status: DC | PRN
  Administered 2013-11-24: 25 ug/kg/min via INTRAVENOUS

## 2013-11-24 MED ORDER — DOCUSATE SODIUM 100 MG PO CAPS
100.0000 mg | ORAL_CAPSULE | Freq: Every day | ORAL | Status: DC
  Administered 2013-11-25 – 2013-11-29 (×5): 100 mg via ORAL
  Filled 2013-11-24 (×7): qty 1

## 2013-11-24 MED ORDER — SODIUM CHLORIDE 0.9 % IV SOLN
INTRAVENOUS | Status: DC | PRN
  Administered 2013-11-24: via INTRAVENOUS

## 2013-11-24 MED ORDER — OXYCODONE HCL 5 MG/5ML PO SOLN: 5.0000 mg | Freq: Once | ORAL | Status: DC | PRN

## 2013-11-24 MED ORDER — IPRATROPIUM BROMIDE 0.02 % IN SOLN
0.5000 mg | Freq: Four times a day (QID) | RESPIRATORY_TRACT | Status: DC
  Administered 2013-11-24 – 2013-11-25 (×4): 0.5 mg via RESPIRATORY_TRACT
  Filled 2013-11-24 (×5): qty 2.5

## 2013-11-24 MED ORDER — DEXTROSE 5 % IV SOLN
30.0000 ug/min | INTRAVENOUS | Status: DC
  Administered 2013-11-24 (×2): 120 ug/min via INTRAVENOUS
  Administered 2013-11-25: 150 ug/min via INTRAVENOUS
  Administered 2013-11-25: 120 ug/min via INTRAVENOUS
  Administered 2013-11-25: 150 ug/min via INTRAVENOUS
  Administered 2013-11-26: 90 ug/min via INTRAVENOUS
  Administered 2013-11-26: 120 ug/min via INTRAVENOUS
  Administered 2013-11-27: 90 ug/min via INTRAVENOUS
  Administered 2013-11-27: 80 ug/min via INTRAVENOUS
  Filled 2013-11-24 (×11): qty 4

## 2013-11-24 MED ORDER — SODIUM CHLORIDE 0.9 % IV SOLN
0.0000 ug/h | INTRAVENOUS | Status: DC
  Administered 2013-11-24: 100 ug/h via INTRAVENOUS
  Filled 2013-11-24: qty 50

## 2013-11-24 MED ORDER — FENTANYL CITRATE 0.05 MG/ML IJ SOLN
50.0000 ug | Freq: Once | INTRAMUSCULAR | Status: AC
  Administered 2013-11-24: 100 ug via INTRAVENOUS

## 2013-11-24 MED ORDER — BIOTENE DRY MOUTH MT LIQD
15.0000 mL | Freq: Four times a day (QID) | OROMUCOSAL | Status: DC
  Administered 2013-11-24 – 2013-12-05 (×42): 15 mL via OROMUCOSAL

## 2013-11-24 MED ORDER — MAGNESIUM SULFATE 40 MG/ML IJ SOLN: 2.0000 g | Freq: Once | INTRAMUSCULAR | Status: AC | PRN

## 2013-11-24 MED ORDER — MIDAZOLAM HCL 2 MG/2ML IJ SOLN
INTRAMUSCULAR | Status: AC
  Administered 2013-11-24: 2 mg
  Filled 2013-11-24: qty 2

## 2013-11-24 MED ORDER — DEXTROSE 5 % IV SOLN
1.5000 g | Freq: Two times a day (BID) | INTRAVENOUS | Status: AC
  Administered 2013-11-24 (×2): 1.5 g via INTRAVENOUS
  Filled 2013-11-24 (×2): qty 1.5

## 2013-11-24 NOTE — Procedures (Signed)
Extubation Procedure Note  Patient Details:   Name: [name] [name] DOB: [birth year] MRN: [id number]   Airway Documentation:     Evaluation  O2 sats: stable throughout Complications: No apparent complications Patient did tolerate procedure well. Bilateral Breath Sounds: Diminished Suctioning: Airway Yes  Pt is stable and placed on 50% VM. Pt is tolerating well. RT will continue to monitor.  Devra Dopp D 11/24/2013, 11:48 AM

## 2013-11-24 NOTE — Progress Notes (Signed)
eLink Physician-Brief Progress Note Patient Name: [name] [name] DOB: [birth year] MRN: [id number]  Date of Service  11/24/2013   HPI/Events of Note   Respiratory acidosis on ABG Good air movement on exam per RT  eICU Interventions  Increase RR 24 and repeat ABG   Intervention Category Major Interventions: Acid-Base disturbance - evaluation and management  Scotti Motter 11/24/2013, 2:45 AM

## 2013-11-24 NOTE — Progress Notes (Signed)
RT called Dr. Kendrick Fries about ABG drawn and resulted on ISTAT. RT changed rate from 12 to 24 per Dr. Ulyses Jarred orders. ABG via ISTAT results were pH 7.29, PCO2 49.8, PO2 112.

## 2013-11-24 NOTE — Progress Notes (Addendum)
Vascular and Vein Specialists Progress Note  11/24/2013 11:59 AM 1 Day Post-Op  Subjective:  Recently extubated; denies pain in either foot  Afebrile HR 50's-80's Atrial Fib 80-120's systolic   Filed Vitals:   11/24/13 1015  BP: 100/39  Pulse: 84  Temp:   Resp: 24    Physical Exam: Incisions:  C/d/i with drain in place Extremities:  Right foot is warm; there is audible doppler signals in the right; dampened monophasic DP/PT in the left. Cardiac:  Irregular Lungs:  Just extubated in the past couple of hours-now on face mask.  Slightly labored at this time.  CBC    Component Value Date/Time   WBC 7.9 11/24/2013 0710   RBC 3.57* 11/24/2013 0710   HGB 11.3* 11/24/2013 0710   HCT 34.4* 11/24/2013 0710   PLT 123* 11/24/2013 0710   MCV 96.4 11/24/2013 0710   MCH 31.7 11/24/2013 0710   MCHC 32.8 11/24/2013 0710   RDW 14.2 11/24/2013 0710    BMET    Component Value Date/Time   NA 139 11/24/2013 0710   K 4.2 11/24/2013 0710   CL 106 11/24/2013 0710   CO2 23 11/24/2013 0710   GLUCOSE 213* 11/24/2013 0710   BUN 41* 11/24/2013 0710   CREATININE 1.89* 11/24/2013 0710   CALCIUM 7.8* 11/24/2013 0710   GFRNONAA 32* 11/24/2013 0710   GFRAA 37* 11/24/2013 0710    INR No results found for this basename: inr     Intake/Output Summary (Last 24 hours) at 11/24/13 1159 Last data filed at 11/24/13 1000  Gross per 24 hour  Intake 4292.34 ml  Output   1025 ml  Net 3267.34 ml     Assessment:  79 y.o. male is s/p:  1. Right femoral embolectomy  2. Bovine pericardial patch angioplasty of the right femoral artery  1 Day Post-Op  Plan: -continue heparin gtt -will discontinue drain tomorrow  -chronic AFib-may need cardiology consult and long term anticoagulation. --will need echocardiogram to determine if thrombus is cardiac source -mobilize patient OOB to chair -DVT prophylaxis:  Heparin gtt -renal insufficiency-continue to monitor   Doreatha Massed,  PA-C [facility] [phone] 11/24/2013 11:59 AM    I have examined the patient, reviewed and agree with above. Discussed with the patient and his family present. Good Doppler flow in right foot with no symptoms appears to be motor and sensory intact. On the left he has had a prior stroke with significant weakness in his left arm and left leg. He does have dampened monophasic flow at the dorsalis pedis and posterior tibial on the left with no evidence of critical ischemia. Continued plan per critical care service regarding his rate control for his A. fib.  Sheritha Louis, MD 11/24/2013 12:53 PM

## 2013-11-24 NOTE — Op Note (Signed)
    NAME: [name] [name]   MRN: [id number] DOB: December 19, 1930    DATE OF OPERATION: 11/24/2013  PREOP DIAGNOSIS: ischemic right lower extremity  POSTOP DIAGNOSIS: same  PROCEDURE:  1. Right femoral embolectomy 2. Bovine pericardial patch angioplasty of the right femoral artery  SURGEON: Di Kindle. Edilia Bo, MD, FACS  ASSIST: none  ANESTHESIA: Gen.   EBL: 100 cc  INDICATIONS: [name] [name] is a 79 y.o. male who developed the sudden onset of paralysis in his right lower extremity and then limb was also noted to be pale. Initially it was felt to potentially have a stroke however workup [facility] did not show evidence of stroke. Subsequent duplex scan showed no arterial flow in the right lower extremity knee was [facility].  FINDINGS: A large amount of fresh thrombus was retrieved from the common femoral artery and superficial femoral artery in addition to the deep femoral artery. The catheter passed 55 cm.  TECHNIQUE: The patient was taken to the operating room and received a general anesthetic. The right lower extremity was prepped and draped in usual sterile fashion. Given his obesity I elected to make a oblique incision above the inguinal crease on the right. This dissection was carried down to the femoral artery which was quite deep. The patient had a fairly high bifurcation of the common femoral artery I dissected the common femoral artery essentially beneath the inguinal ligament. Deep femoral artery was also controlled. The superficial femoral artery was controlled. The patient was heparinized. A longitudinal arteriotomy was made in the superficial femoral artery and extended up to the common femoral artery at the level of the deep femoral artery. A #4 Fogarty catheter was passed multiple times distally and each time stopped about 55 cm likely at the location of the distal popliteal artery. A large amount of fresh thrombus was retrieved. There  was irregularity in the superficial femoral artery suggesting some chronic infrainguinal disease. Once no further clot was retrieved and there was good backbleeding the artery was flushed with heparinized saline clamp. Next proximal thrombectomy was retrieved a large amount of clot was also retrieved proximally from the common femoral artery. Once no further clot was retrieved this artery was clamped. Next the catheter was directed down the deep femoral artery and clot was retrieved from the deep femoral artery. Next a bovine pericardial patch was soaked for 3 minutes and then cut to be used as a patch. This was sewn with continuous 5-0 Prolene suture. Prior to completing the patch closure, the arteries were backbled and flushed properly and the anastomosis completed. Flow was established to the right leg. At this point there was a posterior tibial and peroneal signal with the Doppler. Hemostasis was obtained in the wound and a 15 Blake drain was placed. The wound is closed in 2 deep layers of 2-0 Vicryl. The subcutaneous layer was closed with 3-0 Vicryl the skin closed with 40 septic or stitch. Dermabond was applied. The patient tolerated the procedure well was transferred to the intensive care unit in good condition.  He had been started on a Cardizem drip for rapid atrial fibrillation intraoperatively and we elected to keep him intubated postoperatively. Critical care medicine because of the postoperatively.  [name] Ferrari, MD, FACS [facility]ists of [facility]  DATE OF DICTATION:   11/24/2013

## 2013-11-24 NOTE — Transfer of Care (Signed)
Immediate Anesthesia Transfer of Care Note  Patient: [name] [name]  Procedure(s) Performed: Procedure(s) with comments: Right Femoral EMBOLECTOMY (Right) - Right Femoral Embolectomy with Bovine Paricardium patch angioplasty.  Patient Location: ICU  Anesthesia Type:General  Level of Consciousness: Patient remains intubated per anesthesia plan  Airway & Oxygen Therapy: Patient remains intubated per anesthesia plan and Patient placed on Ventilator (see vital sign flow sheet for setting)  Post-op Assessment: Report given to PACU RN and Post -op Vital signs reviewed and stable  Post vital signs: Reviewed and stable  Complications: No apparent anesthesia complications

## 2013-11-24 NOTE — Progress Notes (Signed)
  Echocardiogram 2D Echocardiogram has been performed.  Georgian Co 11/24/2013, 2:55 PM

## 2013-11-24 NOTE — ED Provider Notes (Signed)
Patient screened after being transported her from [facility].  Patient was accepted for vascular surgery by our team. Patient denies other new complaints, and VS are abnormal, but not currently alarming. Patient remained in ED for some time, awaiting surgical repair.   Gerhard Munch, MD 11/24/13 Marlyne Beards

## 2013-11-24 NOTE — Progress Notes (Signed)
OT Cancellation Note  Patient Details Name: [name] [name] MRN: [id number] DOB: 09-Feb-1931   Cancelled Treatment:    Reason Eval/Treat Not Completed: Medical issues which prohibited therapy - PT spoke with RN - recommended to defer PT/OT today due to respiratory status.  Will reattempt.   Boykin Reaper [phone] 11/24/2013, 12:52 PM

## 2013-11-24 NOTE — Progress Notes (Signed)
eLink Physician-Brief Progress Note Patient Name: [name] [name] DOB: Sep 22, 1931 MRN: [id number]  Date of Service  11/24/2013   HPI/Events of Note   Hypotension, sedation related? Afib with RVR  eICU Interventions  Bolus 500cc Neo for MAP > 65 Sedate with fentanyl Cardizem if BP OK, neo off   Intervention Category Intermediate Interventions: Hypotension - evaluation and management;Arrhythmia - evaluation and management  Maral Lampe 11/24/2013, 2:27 AM

## 2013-11-24 NOTE — Progress Notes (Signed)
ANTICOAGULATION CONSULT NOTE - Initial Consult  Pharmacy Consult for Heparin  Indication: atrial fibrillation, pt is also s/p embolectomy of femoral artery   No Known Allergies  Patient Measurements: Height: 5\' 7"  (170.2 cm) Weight: 197 lb (89.359 kg) IBW/kg (Calculated) : 66.1 Heparin Dosing Weight: ~84 kg  Vital Signs: Temp: 97.5 F (36.4 C) (12/12 0127) Temp src: Oral (12/12 0127) BP: 111/85 mmHg (12/11 2130) Pulse Rate: 75 (12/12 0126)  Labs from [facility]:  H/H 12.6/39.3, Plts 175, INR 1.2, SCr 2.1  Medical History: Past Medical History  Diagnosis Date  . Hypertension   . Stroke   . COPD (chronic obstructive pulmonary disease)   . Diabetes mellitus without complication   . GERD (gastroesophageal reflux disease)   . A-fib     Assessment: 79 y/o M tx from [facility] for ischemic RLE now s/p embolectomy, also developed afib in the OR, to be started on IV heparin. Labs as above from [facility]. Did receive a bolus of heparin in the OR.   Goal of Therapy:  Heparin level 0.3-0.7 units/ml Monitor platelets by anticoagulation protocol: Yes   Plan:  -Heparin 3000 units BOLUS x 1 (lower given recent BOLUS and renal dysfunction) -Start heparin drip at 1000 units/hr -Check 8 hour HL at 1100 -Daily CBC/HL -Monitor for bleeding  Thank you for allowing me to take part in this patient's care,  Abran Duke, PharmD Clinical Pharmacist Phone: [phone] Pager: [phone] 11/24/2013 2:31 AM

## 2013-11-24 NOTE — Progress Notes (Signed)
PT Cancellation Note  Patient Details Name: [name] [name] MRN: [id number] DOB: 15-Sep-1931   Cancelled Treatment:    Reason Eval/Treat Not Completed: Patient not medically ready. Spoke with [name] Ohm, RN @ 10:00 and pt slightly agitated on vent. He wants to hold activity and see if they can get pt more comfortable and/or extubated. Will attempt eval 12/13 if appropriate.   Auryn Paige 11/24/2013, 1:10 PM Pager [phone]

## 2013-11-24 NOTE — Progress Notes (Signed)
eLink Physician-Brief Progress Note Patient Name: Khyler Eschmann DOB: 1931/08/04 MRN: [id number]  Date of Service  11/24/2013   HPI/Events of Note   New consult for vent management from vascular surgery Needs sedation/vent orders  eICU Interventions  Vent/sedation orders placed Fellow to see for formal consultation   Intervention Category Intermediate Interventions: Respiratory distress - evaluation and management  Meghanne Pletz 11/24/2013, 2:10 AM

## 2013-11-24 NOTE — Progress Notes (Signed)
ANTICOAGULATION CONSULT NOTE - Follow Up Consult  Pharmacy Consult for heparin Indication: DVT, afib  No Known Allergies  Patient Measurements: Height: 5\' 7"  (170.2 cm) Weight: 197 lb (89.359 kg) IBW/kg (Calculated) : 66.1 Heparin Dosing Weight: ~ 85 kg  Vital Signs: Temp: 98.8 F (37.1 C) (12/12 1216) Temp src: Oral (12/12 1216) BP: 94/47 mmHg (12/12 1230) Pulse Rate: 70 (12/12 1230)  Labs:  Recent Labs  11/24/13 0710  HGB 11.3*  HCT 34.4*  PLT 123*  APTT 77*  HEPARINUNFRC 1.26*  CREATININE 1.89*    Estimated Creatinine Clearance: 33.2 ml/min (by C-G formula based on Cr of 1.89).   Medications:  Scheduled:  . albuterol  2.5 mg Nebulization Q6H   And  . ipratropium  0.5 mg Nebulization Q6H  . antiseptic oral rinse  15 mL Mouth Rinse QID  . cefUROXime (ZINACEF)  IV  1.5 g Intravenous Q12H  . chlorhexidine  15 mL Mouth Rinse BID  . [START ON 11/25/2013] docusate sodium  100 mg Oral Daily  . insulin aspart  0-15 Units Subcutaneous Q4H  . metoprolol  5 mg Intravenous Q6H  . pantoprazole (PROTONIX) IV  40 mg Intravenous Daily   . dextrose 5 % and 0.9% NaCl 20 mL/hr at 11/24/13 1146  . heparin 1,000 Units/hr (11/24/13 0315)  . phenylephrine (NEO-SYNEPHRINE) Adult infusion 40 mcg/min (11/24/13 1209)   Assessment: 79 y/o M tx from [place] for ischemic RLE now s/p embolectomy, also developed afib in the OR, to be started on IV heparin.  Spoke to [facility] pharmacy department, confirmed that he received 1 dose of Xarelto 15 mg yesterday (12/11) at ~ 9AM.  Initial heparin level elevated, as expected after receiving Xarelto.  Will need to make dosing decisions based on PTT for now.  Current PTT in goal range.  No bleeding or complications noted per chart notes.  Goal of Therapy:  PTT 66-102 Heparin level 0.3-0.7 units/ml Monitor platelets by anticoagulation protocol: Yes   Plan:  1. Continue IV heparin at current rate of 1000 units/hr. 2. Will confirm that  PTT remains therapeutic in 8 hrs. 3. Will not recheck heparin level until tomorrow AM. 4. Continue daily CBC.  Tad Moore, BCPS  Clinical Pharmacist Pager [phone]  11/24/2013 12:58 PM

## 2013-11-24 NOTE — Progress Notes (Signed)
ANTICOAGULATION CONSULT NOTE - Follow Up Consult  Pharmacy Consult for heparin Indication: DVT, afib  No Known Allergies  Patient Measurements: Height: 5\' 7"  (170.2 cm) Weight: 197 lb (89.359 kg) IBW/kg (Calculated) : 66.1 Heparin Dosing Weight: ~ 85 kg  Vital Signs: Temp: 98.8 F (37.1 C) (12/12 1216) Temp src: Oral (12/12 1216) BP: 95/77 mmHg (12/12 1900) Pulse Rate: 25 (12/12 1930)  Labs:  Recent Labs  11/24/13 0710 11/24/13 1857  HGB 11.3*  --   HCT 34.4*  --   PLT 123*  --   APTT 77* 49*  HEPARINUNFRC 1.26*  --   CREATININE 1.89*  --     Estimated Creatinine Clearance: 33.2 ml/min (by C-G formula based on Cr of 1.89).   Medications:  Scheduled:  . albuterol  2.5 mg Nebulization Q6H   And  . ipratropium  0.5 mg Nebulization Q6H  . antiseptic oral rinse  15 mL Mouth Rinse QID  . chlorhexidine  15 mL Mouth Rinse BID  . [START ON 11/25/2013] docusate sodium  100 mg Oral Daily  . insulin aspart  0-15 Units Subcutaneous Q4H  . metoprolol  5 mg Intravenous Q6H  . pantoprazole (PROTONIX) IV  40 mg Intravenous Daily   . dextrose 5 % and 0.9% NaCl 20 mL/hr at 11/24/13 1146  . heparin 1,000 Units/hr (11/24/13 0315)  . phenylephrine (NEO-SYNEPHRINE) Adult infusion 120 mcg/min (11/24/13 1649)   Assessment: 79 y/o M tx from [place] for ischemic RLE now s/p embolectomy, also developed afib in the OR, to be started on IV heparin.  Spoke to [facility] pharmacy department, confirmed that he received 1 dose of Xarelto 15 mg yesterday (12/11) at ~ 9AM.  Initial heparin level elevated, as expected after receiving Xarelto.  Repeat aPTT is only slightly low. Will increase rate and check level in AM  Goal of Therapy:  PTT 66-102 Heparin level 0.3-0.7 units/ml Monitor platelets by anticoagulation protocol: Yes   Plan:  1. Increase heparin to 1150 units/hr 2. Check AM heparin level 3. Cont daily CBC

## 2013-11-24 NOTE — Care Management Note (Signed)
    Page 1 of 1   11/27/2013     3:07:40 PM   CARE MANAGEMENT NOTE 11/27/2013  Patient:  [name] [name],[name] [name]   Account Number:  [id number]  Date Initiated:  11/24/2013  Documentation initiated by:  Avie Arenas  Subjective/Objective Assessment:   Tx from [facility] with ischemic leg.     Action/Plan:   Anticipated DC Date:  12/01/2013   Anticipated DC Plan:  LONG TERM ACUTE CARE (LTAC)      DC Planning Services  CM consult      Choice offered to / List presented to:             Status of service:  In process, will continue to follow Medicare Important Message given?   (If response is "NO", the following Medicare IM given date fields will be blank) Date Medicare IM given:   Date Additional Medicare IM given:    Discharge Disposition:    Per UR Regulation:  Reviewed for med. necessity/level of care/duration of stay  If discussed at Long Length of Stay Meetings, dates discussed:    Comments:  Contact:  Moldovan,MARLENE    [phone] or cell [phone]  11-27-13 3pm Avie Arenas, RNBSN - [phone] Patient continues on pressors - PT recommending [facility] or SNF.  Verified has [employer] and no [facility] benefits unless - intubated and trached.  SW consult placed.  Updated family.  Desire [facility] or [facility] [place] rehab facilities.  11-24-13 3:41pm Avie Arenas, RNBSN [phone] Pateint now extubated on VM.  After talking with nurse and physician, talked with wife and son in law about rehab options.  Prior to this admission patient at home with wife.  He gets along fairly well with walker at home and they have assistance of daughter and son in law.  Wife doesn't feel she can handle him at home now and is definitely interested in rehab prior to coming home. Talked about different options from [facility] to [facility].  Wife very interested in [facility] option.  If could go this route with insurance and is bed available would choose [facility].  However, their insuance per wife  is now [place] - not medicare which we have listed and will be changing to [place] in January.   [facility]ified [facility] to please check insurance benefits on this possible candidate.  Will followup on Monday with wife.

## 2013-11-24 NOTE — Anesthesia Postprocedure Evaluation (Signed)
  Anesthesia Post-op Note  Patient: [name] [name]  Procedure(s) Performed: Procedure(s) with comments: Right Femoral EMBOLECTOMY (Right) - Right Femoral Embolectomy with Bovine Paricardium patch angioplasty.  Patient Location: ICU  Anesthesia Type:General  Level of Consciousness: Patient remains intubated per anesthesia plan  Airway and Oxygen Therapy: Patient remains intubated per anesthesia plan and Patient placed on Ventilator (see vital sign flow sheet for setting)  Post-op Pain: none  Post-op Assessment: Post-op Vital signs reviewed, PATIENT'S CARDIOVASCULAR STATUS UNSTABLE, Respiratory Function Stable, Patent Airway, No signs of Nausea or vomiting and Pain level controlled  Post-op Vital Signs: Reviewed and stable  Complications: No apparent anesthesia complications

## 2013-11-24 NOTE — Progress Notes (Signed)
UR Completed.  [name] [name] Jane [phone] 11/24/2013  

## 2013-11-24 NOTE — Progress Notes (Signed)
MD Edilia Bo- vascular surgery- notified that patients left pedal pulse not palpable or dopplerable at this time. No further orders at this time.

## 2013-11-24 NOTE — Progress Notes (Signed)
PULMONARY  / CRITICAL CARE MEDICINE  Name: [name] [name] MRN: [id number] DOB: 1931-05-10    ADMISSION DATE:  11/23/2013 CONSULTATION DATE:  11/24/13  REFERRING MD :  Waverly Ferrari, MD PRIMARY SERVICE: Vascular surgery  CHIEF COMPLAINT:  Post right femoral embolectomy. Intubated.   BRIEF PATIENT DESCRIPTION:  79 years old male with PMH relevant for HTN, DM, A. Fib not on anticoagulation, PVD, COPD. Presents with ischemic right lower extremity. Now post right femoral embolectomy. Out of the OR intubated. Consult called for vent management. Post op course c/b AFRVR, hypotension  SIGNIFICANT EVENTS / STUDIES:  12/12 Right femoral embolectomy. Bovine pericardial patch angioplasty of the right femoral artery  LINES / TUBES: ETT 12/11 >>   CULTURES:   ANTIBIOTICS: Cefuroxime (peri-op) 12/11 >> 12/12   SUBJECTIVE:  Passed SBT. Extubated. No distress post extubation   VITAL SIGNS: Temp:  [97.3 F (36.3 C)-98.8 F (37.1 C)] 98.8 F (37.1 C) (12/12 1216) Pulse Rate:  [26-145] 70 (12/12 1230) Resp:  [12-24] 17 (12/12 1230) BP: (74-165)/(39-88) 94/47 mmHg (12/12 1230) SpO2:  [85 %-100 %] 96 % (12/12 1230) FiO2 (%):  [30 %-50 %] 50 % (12/12 1145) Weight:  [89.359 kg (197 lb)] 89.359 kg (197 lb) (12/11 2053) HEMODYNAMICS:   VENTILATOR SETTINGS: Vent Mode:  [-] PRVC FiO2 (%):  [30 %-50 %] 50 % Set Rate:  [12 bmp-24 bmp] 24 bmp Vt Set:  [500 mL-530 mL] 530 mL PEEP:  [5 cmH20-8 cmH20] 8 cmH20 Plateau Pressure:  [21 cmH20-27 cmH20] 22 cmH20 INTAKE / OUTPUT: Intake/Output     12/11 0701 - 12/12 0700 12/12 0701 - 12/13 0700   I.V. (mL/kg) 2721.8 (30.5) 520.5 (5.8)   IV Piggyback 1050    Total Intake(mL/kg) 3771.8 (42.2) 520.5 (5.8)   Urine (mL/kg/hr) 600 200 (0.3)   Drains 25    Blood 200    Total Output 825 200   Net +2946.8 +320.5          PHYSICAL EXAMINATION: General: NAD HEENT: WNL Heart: RRR. + systolic M @ LUSB  Lungs: No wheezes Abdomen: soft, NT,  NABS Ext; cool, diminished pulses, JP drain in R groin Neuro: RASS 0, + F/C, MAEs   LABS: I have reviewed all of today's lab results. Relevant abnormalities are discussed in the A/P section   CXR: CM, vasc cong  ASSESSMENT / PLAN:  PULMONARY A: VDRF Smoking history - suspected COPD P:   Monitor in ICU post extubation Cont nebulized BDs   CARDIOVASCULAR A:  AFRVR Hypotension Chronic beta blocker use P:  Scheduled IV metoprolol - transition to PO when able PRN metoprolol to maintain HR < 115/min Wean phenylephrine to off as tolerated maintaining MAP > 60 mmHg EKG 12/13 AM  RENAL A:   Elevated Cr. Baseline unknown. Suspect CKD P:   Monitor BMET intermittently Correct electrolytes as indicated  GASTROINTESTINAL A:   No issues P:   SUP: IV PPI NPO after extubation Consider diet later today or in AM 12/13  HEMATOLOGIC A:   Acute RLE thrombus/embolus - s/p embolectomy P:  DVT px: full heparin per pharmacy Monitor CBC intermittently  INFECTIOUS A:   No evidence of acute infection P:   Micro and abx as above  ENDOCRINE A:   DM 2 P:   Cont SSI Change to ACHS when diet started  NEUROLOGIC A:   Post op pain P:   PRN fentanyl   I have personally obtained a history, examined the patient, evaluated laboratory and imaging results,  formulated the assessment and plan and placed orders. CRITICAL CARE: Critical Care Time devoted to patient care services described in this note is 35 minutes.   Billy Fischer, MD ; [facility] [phone].  After 5:30 PM or weekends, call [phone]

## 2013-11-24 NOTE — Consult Note (Signed)
PULMONARY  / CRITICAL CARE MEDICINE  Name: [name] [name] MRN: [id number] DOB: 07/15/1931    ADMISSION DATE:  11/23/2013 CONSULTATION DATE:  11/24/13  REFERRING MD :  Waverly Ferrari, MD PRIMARY SERVICE: Vascular surgery  CHIEF COMPLAINT:  Post right femoral embolectomy. Intubated.   BRIEF PATIENT DESCRIPTION:  79 years old male with PMH relevant for HTN, DM, A. Fib not on anticoagulation, PVD, COPD. Presents with ischemic right lower extremity. Now post right femoral embolectomy. Out of the OR intubated. Consult called for vent management.   SIGNIFICANT EVENTS / STUDIES:   LINES / TUBES: - Peripheral IV's - Foley catheter  CULTURES: No cultures sent  ANTIBIOTICS: - Cefuroxime x 2 doses per vascular surgery.   HISTORY OF PRESENT ILLNESS:   79 years old male with PMH relevant for HTN, DM, A. Fib not on anticoagulation, PVD, COPD. Presents with ischemic right lower extremity. Now post right femoral embolectomy. Out of the OR intubated. Consult called for vent management. He was referred from [facility] where he was admitted for possible stroke. His workup was negative for stroke. He then noticed weakness, numbness and paleness of his right lower extremity, Arterial doppler did not detect any flow and he was transferred to [facility] with diagnosis of acute arterial occlusion of his right femoral artery. He underwent right femoral embolectomy and was transferred to the ICU intubated. CCM consult was called to assist with management. At the time of my exam the patient is intubated, awake, following commands, agitated trying to pull his ETT out. He is in A. Fib with HR of 90 to 110.   PAST MEDICAL HISTORY :  Past Medical History  Diagnosis Date  . Hypertension   . Stroke   . COPD (chronic obstructive pulmonary disease)   . Diabetes mellitus without complication   . GERD (gastroesophageal reflux disease)   . A-fib    Past Surgical History  Procedure Laterality Date  . Carotid  stent    . Parotid gland tumor excision     Prior to Admission medications   Medication Sig Start Date End Date Taking? Authorizing Provider  aspirin EC 81 MG tablet Take 81 mg by mouth at bedtime.   Yes Historical Provider, MD  atorvastatin (LIPITOR) 10 MG tablet Take 10 mg by mouth daily.   Yes Historical Provider, MD  benazepril (LOTENSIN) 10 MG tablet Take 10 mg by mouth 2 (two) times daily.   Yes Historical Provider, MD  furosemide (LASIX) 40 MG tablet Take 60 mg by mouth daily.   Yes Historical Provider, MD  glipiZIDE (GLUCOTROL XL) 10 MG 24 hr tablet Take 10 mg by mouth 2 (two) times daily.   Yes Historical Provider, MD  metoprolol (LOPRESSOR) 50 MG tablet Take 50 mg by mouth 2 (two) times daily.   Yes Historical Provider, MD  potassium chloride SA (K-DUR,KLOR-CON) 20 MEQ tablet Take 20 mEq by mouth daily.   Yes Historical Provider, MD  ranitidine (ZANTAC) 150 MG tablet Take 150 mg by mouth 2 (two) times daily.   Yes Historical Provider, MD  sitaGLIPtin-metformin (JANUMET) 50-500 MG per tablet Take 1 tablet by mouth 2 (two) times daily with a meal.   Yes Historical Provider, MD  terazosin (HYTRIN) 5 MG capsule Take 5 mg by mouth every other day.   Yes Historical Provider, MD   No Known Allergies  FAMILY HISTORY:  No family history on file. SOCIAL HISTORY:  reports that he has quit smoking. He does not have any smokeless tobacco history  on file. He reports that he does not drink alcohol or use illicit drugs.  REVIEW OF SYSTEMS:  Unable to provide  SUBJECTIVE:   VITAL SIGNS: Temp:  [97.5 F (36.4 C)] 97.5 F (36.4 C) (12/12 0127) Pulse Rate:  [30-116] 75 (12/12 0126) Resp:  [14-19] 14 (12/12 0126) BP: (99-111)/(53-85) 111/85 mmHg (12/11 2130) SpO2:  [98 %-100 %] 100 % (12/12 0126) FiO2 (%):  [40 %] 40 % (12/12 0255) Weight:  [197 lb (89.359 kg)] 197 lb (89.359 kg) (12/11 2053) HEMODYNAMICS:   VENTILATOR SETTINGS: Vent Mode:  [-] PRVC FiO2 (%):  [40 %] 40 % Set Rate:   [12 bmp-24 bmp] 24 bmp Vt Set:  [500 mL] 500 mL PEEP:  [5 cmH20] 5 cmH20 Plateau Pressure:  [21 cmH20-26 cmH20] 26 cmH20 INTAKE / OUTPUT: Intake/Output     12/11 0701 - 12/12 0700   I.V. (mL/kg) 1900 (21.3)   Total Intake(mL/kg) 1900 (21.3)   Urine (mL/kg/hr) 350   Blood 200   Total Output 550   Net +1350         PHYSICAL EXAMINATION: General: Sedated, intubated, no acute distress. Eyes: Anicteric sclerae. PERLA ENT: ETT in place. Trachea at midline.  Lymph: No cervical, supraclavicular, or axillary lymphadenopathy. Heart: Normal S1, S2. No murmurs, rubs, or gallops appreciated. No bruits, right dorsalis pedis pulse present with doppler. Left dorsalis pedis not detected with doppler. Left posterior tibialis pulse detectable with doppler.  Lungs: Normal excursion, no dullness to percussion. Good air movement bilaterally, without wheezes or crackles.  Abdomen: Abdomen soft, non-tender and not distended, normoactive bowel sounds. No hepatosplenomegaly or masses. Musculoskeletal: No clubbing or synovitis. No LE edema Skin: No rashes or lesions Neuro: Patient is awake, agitated, following commands.   LABS:  CBC No results found for this basename: WBC, HGB, HCT, PLT,  in the last 168 hours Coag's No results found for this basename: APTT, INR,  in the last 168 hours BMET  Recent Labs Lab 11/23/13 2355  GLUCOSE 48*   Electrolytes No results found for this basename: CALCIUM, MG, PHOS,  in the last 168 hours Sepsis Markers No results found for this basename: LATICACIDVEN, PROCALCITON, O2SATVEN,  in the last 168 hours ABG  Recent Labs Lab 11/24/13 0240  PHART 7.294*  PCO2ART 49.8*  PO2ART 112.0*   Liver Enzymes No results found for this basename: AST, ALT, ALKPHOS, BILITOT, ALBUMIN,  in the last 168 hours Cardiac Enzymes No results found for this basename: TROPONINI, PROBNP,  in the last 168 hours Glucose  Recent Labs Lab 11/23/13 2159 11/24/13 0125  GLUCAP 57* 78     Imaging Portable Chest Xray  11/24/2013   CLINICAL DATA:  Endotracheal tube placement.  EXAM: PORTABLE CHEST - 1 VIEW  COMPARISON:  Chest radiograph performed 11/23/2013  FINDINGS: The patient's endotracheal tube is seen ending 4 cm above the carina. Enteric tube is noted extending below the diaphragm.  Lung expansion is mildly decreased. Vascular congestion is noted, with bilateral central airspace opacities, raising concern for mild pulmonary edema. No definite pleural effusion or pneumothorax is seen.  The cardiomediastinal silhouette is mildly enlarged. Calcification is noted within the aortic arch. No acute osseous abnormalities are identified.  IMPRESSION: 1. Endotracheal tube seen ending 4 cm above the carina. 2. Vascular congestion and mild cardiomegaly, with bilateral central airspace opacities, raising concern for mild pulmonary edema. Previously noted right basilar opacity has improved.   Electronically Signed   By: Roanna Raider M.D.   On: 11/24/2013 02:34  Dg Chest Portable 1 View  11/23/2013   CLINICAL DATA:  Shortness of breath; preoperative chest radiograph.  EXAM: PORTABLE CHEST - 1 VIEW  COMPARISON:  Chest radiograph performed 11/22/2013  FINDINGS: The lungs are well-aerated. Mild bibasilar airspace opacities are slightly worsened from the prior study and may reflect atelectasis, mild pneumonia or possibly mild pulmonary edema, given underlying vascular congestion. No pleural effusion or pneumothorax is seen.  The cardiomediastinal silhouette is mildly enlarged. No acute osseous abnormalities are seen.  IMPRESSION: Mild bibasilar airspace opacities are slightly worsened from the recent prior study and may reflect atelectasis, mild pneumonia or possibly mild pulmonary edema, given underlying vascular congestion and mild cardiomegaly.   Electronically Signed   By: Roanna Raider M.D.   On: 11/23/2013 21:51     CXR:  IMPRESSION:  1. Endotracheal tube seen ending 4 cm above the  carina.  2. Vascular congestion and mild cardiomegaly, with bilateral central  airspace opacities, raising concern for mild pulmonary edema.  Previously noted right basilar opacity has improved.   ASSESSMENT / PLAN:  PULMONARY A: 1) Out of the OR intubated 2) ABG with respiratory acidosis. P:   - Mechanical ventilation   - PRVC, Vt: 8cc/kg, PEEP: 5, RR: 24, FiO2: 100% and adjust to keep O2 sat > 94%   - VAP prevention order set   - Daily awakening and SBT - Duonebs q4 hrs. - Follow up ABG   CARDIOVASCULAR A:  1) Atrial fibrillation with HR at the time of my exam of 90 to 110, likely related to agitation. P:  - No need for Cardizem drip now since his BP is borderline. Will start if HR uncontrolled despite sedation.  - Will do PRN versed and fentanyl for agitation and pain. This should help is HR.   RENAL A:   1) No chemistry available  P:   - Will get basic metabolic panel.  GASTROINTESTINAL A:   1) No issues P:   - GI prophylaxis with protonix  HEMATOLOGIC A:   1) No CBC available P:  - Will get CBC  INFECTIOUS A:   1) No evidence of acute infection P:   - Cefuroxime X 2 doses per vascular surgery  ENDOCRINE A:   1) DM 2 P:   - ICU hyperglycemia protocol with sub q insulin sliding scale.   NEUROLOGIC A:   1)  Intubated, agitated, following commands. P:   - PRN fentanyl and versed for a RASS score of 0 to -1   I have personally obtained a history, examined the patient, evaluated laboratory and imaging results, formulated the assessment and plan and placed orders. CRITICAL CARE: Critical Care Time devoted to patient care services described in this note is 45 minutes.   Overton Mam, MD Pulmonary and Critical Care Medicine [facility] Pager: [phone]  11/24/2013, 3:16 AM

## 2013-11-25 ENCOUNTER — Inpatient Hospital Stay (HOSPITAL_COMMUNITY): Payer: Medicare HMO

## 2013-11-25 DIAGNOSIS — I959 Hypotension, unspecified: Secondary | ICD-10-CM

## 2013-11-25 DIAGNOSIS — J9600 Acute respiratory failure, unspecified whether with hypoxia or hypercapnia: Secondary | ICD-10-CM

## 2013-11-25 DIAGNOSIS — I4891 Unspecified atrial fibrillation: Secondary | ICD-10-CM

## 2013-11-25 DIAGNOSIS — M79609 Pain in unspecified limb: Secondary | ICD-10-CM

## 2013-11-25 LAB — BASIC METABOLIC PANEL
BUN: 43 mg/dL — ABNORMAL HIGH (ref 6–23)
Calcium: 8.2 mg/dL — ABNORMAL LOW (ref 8.4–10.5)
Chloride: 104 mEq/L (ref 96–112)
Creatinine, Ser: 2.18 mg/dL — ABNORMAL HIGH (ref 0.50–1.35)
GFR calc Af Amer: 31 mL/min — ABNORMAL LOW (ref >90)
Glucose, Bld: 76 mg/dL (ref 70–99)

## 2013-11-25 LAB — GLUCOSE, CAPILLARY
Glucose-Capillary: 69 mg/dL — ABNORMAL LOW (ref 70–99)
Glucose-Capillary: 139 mg/dL — ABNORMAL HIGH (ref 70–99)
Glucose-Capillary: 130 mg/dL — ABNORMAL HIGH (ref 70–99)

## 2013-11-25 LAB — HEPARIN LEVEL (UNFRACTIONATED): Heparin Unfractionated: 0.35 IU/mL (ref 0.30–0.70)

## 2013-11-25 LAB — CBC
HCT: 36.4 % — ABNORMAL LOW (ref 39.0–52.0)
Hemoglobin: 11.6 g/dL — ABNORMAL LOW (ref 13.0–17.0)
MCH: 31.1 pg (ref 26.0–34.0)
MCHC: 31.9 g/dL (ref 30.0–36.0)
Platelets: 183 10*3/uL (ref 150–400)
RDW: 14.5 % (ref 11.5–15.5)

## 2013-11-25 LAB — APTT: aPTT: 72 seconds — ABNORMAL HIGH (ref 24–37)

## 2013-11-25 MED ORDER — AMIODARONE HCL IN DEXTROSE 360-4.14 MG/200ML-% IV SOLN
30.0000 mg/h | INTRAVENOUS | Status: DC
  Filled 2013-11-25: qty 200

## 2013-11-25 MED ORDER — GLUCOSE 40 % PO GEL
ORAL | Status: AC
  Administered 2013-11-25: 37.5 g
  Filled 2013-11-25: qty 1

## 2013-11-25 MED ORDER — LEVALBUTEROL HCL 0.63 MG/3ML IN NEBU
0.6300 mg | INHALATION_SOLUTION | Freq: Four times a day (QID) | RESPIRATORY_TRACT | Status: DC
  Administered 2013-11-25 – 2013-12-04 (×30): 0.63 mg via RESPIRATORY_TRACT
  Filled 2013-11-25 (×67): qty 3

## 2013-11-25 MED ORDER — INSULIN ASPART 100 UNIT/ML ~~LOC~~ SOLN
0.0000 [IU] | SUBCUTANEOUS | Status: DC
  Administered 2013-11-25: 1 [IU] via SUBCUTANEOUS

## 2013-11-25 MED ORDER — AMIODARONE HCL IN DEXTROSE 360-4.14 MG/200ML-% IV SOLN
60.0000 mg/h | INTRAVENOUS | Status: DC
  Administered 2013-11-25: 60 mg/h via INTRAVENOUS
  Filled 2013-11-25 (×2): qty 200

## 2013-11-25 MED ORDER — INSULIN ASPART 100 UNIT/ML ~~LOC~~ SOLN
0.0000 [IU] | Freq: Three times a day (TID) | SUBCUTANEOUS | Status: DC
  Administered 2013-11-25: 3 [IU] via SUBCUTANEOUS
  Administered 2013-11-25 – 2013-11-26 (×2): 2 [IU] via SUBCUTANEOUS
  Administered 2013-11-26: 3 [IU] via SUBCUTANEOUS
  Administered 2013-11-26: 2 [IU] via SUBCUTANEOUS
  Administered 2013-11-27 (×2): 3 [IU] via SUBCUTANEOUS
  Administered 2013-11-27: 2 [IU] via SUBCUTANEOUS
  Administered 2013-11-28 – 2013-11-29 (×4): 3 [IU] via SUBCUTANEOUS
  Administered 2013-11-29: 2 [IU] via SUBCUTANEOUS
  Administered 2013-11-29: 3 [IU] via SUBCUTANEOUS
  Administered 2013-11-30 (×3): 2 [IU] via SUBCUTANEOUS
  Administered 2013-12-01: 3 [IU] via SUBCUTANEOUS
  Administered 2013-12-01 – 2013-12-02 (×2): 2 [IU] via SUBCUTANEOUS
  Administered 2013-12-02 – 2013-12-03 (×2): 3 [IU] via SUBCUTANEOUS
  Administered 2013-12-03 – 2013-12-04 (×2): 2 [IU] via SUBCUTANEOUS
  Administered 2013-12-04: 3 [IU] via SUBCUTANEOUS
  Administered 2013-12-04: 2 [IU] via SUBCUTANEOUS
  Administered 2013-12-05: 5 [IU] via SUBCUTANEOUS

## 2013-11-25 MED ORDER — LEVALBUTEROL HCL 0.63 MG/3ML IN NEBU: 0.6300 mg | INHALATION_SOLUTION | RESPIRATORY_TRACT | Status: DC | PRN

## 2013-11-25 MED ORDER — AMIODARONE LOAD VIA INFUSION
150.0000 mg | Freq: Once | INTRAVENOUS | Status: AC
  Administered 2013-11-25: 150 mg via INTRAVENOUS
  Filled 2013-11-25: qty 83.34

## 2013-11-25 MED ORDER — AMIODARONE HCL IN DEXTROSE 360-4.14 MG/200ML-% IV SOLN
60.0000 mg/h | INTRAVENOUS | Status: AC
  Filled 2013-11-25: qty 200

## 2013-11-25 MED ORDER — IPRATROPIUM BROMIDE 0.02 % IN SOLN
0.5000 mg | Freq: Four times a day (QID) | RESPIRATORY_TRACT | Status: DC
  Administered 2013-11-25 – 2013-12-04 (×30): 0.5 mg via RESPIRATORY_TRACT
  Filled 2013-11-25 (×32): qty 2.5

## 2013-11-25 MED ORDER — AMIODARONE HCL IN DEXTROSE 360-4.14 MG/200ML-% IV SOLN
30.0000 mg/h | INTRAVENOUS | Status: DC
  Administered 2013-11-26 – 2013-11-30 (×10): 30 mg/h via INTRAVENOUS
  Filled 2013-11-25 (×23): qty 200

## 2013-11-25 MED ORDER — METOPROLOL TARTRATE 25 MG PO TABS
25.0000 mg | ORAL_TABLET | Freq: Two times a day (BID) | ORAL | Status: DC
  Administered 2013-11-25 – 2013-11-26 (×3): 25 mg via ORAL
  Filled 2013-11-25 (×6): qty 1

## 2013-11-25 NOTE — Progress Notes (Signed)
PULMONARY  / CRITICAL CARE MEDICINE  Name: [name] [name] MRN: [id number] DOB: 22-Jan-1931    ADMISSION DATE:  11/23/2013 CONSULTATION DATE:  11/24/13  REFERRING MD :  Waverly Ferrari, MD PRIMARY SERVICE: Vascular surgery  CHIEF COMPLAINT:  Post right femoral embolectomy. Intubated.   BRIEF PATIENT DESCRIPTION:  79 year old male with PMH relevant for HTN, DM, A. Fib not on anticoagulation, PVD, COPD. Presents with ischemic right lower extremity. Now post right femoral embolectomy. Out of the OR intubated. Consult called for vent management. Post op course c/b AFRVR, hypotension  SIGNIFICANT EVENTS / STUDIES:  12/12 Right femoral embolectomy. Bovine pericardial patch angioplasty of the right femoral artery 12/12 2D echo>>>EF 60-65%, calcified aortic valve  LINES / TUBES: ETT 12/11 >> 12/12  CULTURES: none  ANTIBIOTICS: Cefuroxime (peri-op) 12/11 >> 12/12   SUBJECTIVE:  Extubated 12/12.  No c/o.  Remains tachycardic, on mod does neo.    VITAL SIGNS: Temp:  [97.6 F (36.4 C)-98.8 F (37.1 C)] 97.8 F (36.6 C) (12/13 0745) Pulse Rate:  [25-157] 61 (12/13 1012) Resp:  [9-24] 24 (12/13 1012) BP: (50-136)/(38-90) 83/45 mmHg (12/13 1012) SpO2:  [63 %-100 %] 99 % (12/13 1012) FiO2 (%):  [50 %-94 %] 94 % (12/12 2336) HEMODYNAMICS:   VENTILATOR SETTINGS: Vent Mode:  [-]  FiO2 (%):  [50 %-94 %] 94 % INTAKE / OUTPUT: Intake/Output     12/12 0701 - 12/13 0700 12/13 0701 - 12/14 0700   P.O. 85 250   I.V. (mL/kg) 2277.3 (25.5) 255.2 (2.9)   IV Piggyback 50    Total Intake(mL/kg) 2412.3 (27) 505.2 (5.7)   Urine (mL/kg/hr) 405 (0.2) 150 (0.5)   Drains 100 (0)    Blood     Total Output 505 150   Net +1907.3 +355.2          PHYSICAL EXAMINATION: General: chronically ill appearing male, NAD  Neuro: awake, alert, appropriate, MAE CV:  s1s2 irreg, tachycardic 110-160 PULM: resps even non labored, exp wheeze  GI: abd distended, soft, hypoactive bs  Extremities:  Warm  and dry, scant BLE edema    LABS:  Recent Labs Lab 11/23/13 2355 11/24/13 0710 11/25/13 0410  NA  --  139 139  K  --  4.2 4.5  CL  --  106 104  CO2  --  23 22  GLUCOSE 48* 213* 76  BUN  --  41* 43*  CREATININE  --  1.89* 2.18*  CALCIUM  --  7.8* 8.2*     CXR:   Bibasilar atx   ASSESSMENT / PLAN:  PULMONARY A: VDRF - resolved  Smoking history - suspected COPD P:   Cont nebulized BDs pulm hygiene  Int f/u cxr   CARDIOVASCULAR A:  AFib RVR Hypotension Chronic beta blocker use P:  Transition to PO scheduled metoprolol  PRN metoprolol to maintain HR < 115/min Cont wean neo to off  Will add amiodarone  ?cards to see - had outpt cardiologist in [place] who retired and has not seen anyone since  PRN lopressor  Remains ~2L POS  RENAL A:   Elevated Cr. Baseline unknown. Suspect CKD P:   Monitor BMET intermittently Correct electrolytes as indicated  GASTROINTESTINAL A:   No issues P:   SUP: IV PPI Ok for po diet    HEMATOLOGIC A:   Acute RLE thrombus/embolus - s/p embolectomy P:  DVT px: full heparin per pharmacy Monitor CBC intermittently Per [facility]VS   INFECTIOUS A:   No evidence of  acute infection P:   Micro and abx as above Trend wbc - slight increase 12/13   ENDOCRINE A:   DM 2 P:   Cont SSI  NEUROLOGIC A:   Post op pain P:   PRN fentanyl  Cont monitor in ICU while on pressors.  Vascular prefers tx to [facility] once out of unit with multiple medical issues   Danford Bad, NP 11/25/2013  10:45 AM Pager: [phone] or [phone]  Reviewed above, examined pt, and agree with assessment/plan.  Respiratory status stable after extubation.  Still requiring pressors, but weaning off.  HR improved after addition of amiodarone >> may need cardiology assessment at some point.  Coralyn Helling, MD [facility] Pulmonary/Critical Care 11/25/2013, 3:08 PM Pager:  [phone] After 3pm call: [phone]

## 2013-11-25 NOTE — Progress Notes (Signed)
ANTICOAGULATION CONSULT NOTE - Follow Up Consult  Pharmacy Consult for heparin Indication: DVT, afib  No Known Allergies  Patient Measurements: Height: 5\' 7"  (170.2 cm) Weight: 197 lb (89.359 kg) IBW/kg (Calculated) : 66.1 Heparin Dosing Weight: ~ 85 kg  Vital Signs: Temp: 97.8 F (36.6 C) (12/13 0745) Temp src: Oral (12/13 0745) BP: 133/71 mmHg (12/13 1030) Pulse Rate: 141 (12/13 1204)  Labs:  Recent Labs  11/24/13 0710 11/24/13 1857 11/25/13 0410 11/25/13 1315  HGB 11.3*  --  11.6*  --   HCT 34.4*  --  36.4*  --   PLT 123*  --  183  --   APTT 77* 49* 44* 84*  HEPARINUNFRC 1.26*  --  0.35 0.35  CREATININE 1.89*  --  2.18*  --     Estimated Creatinine Clearance: 28.8 ml/min (by C-G formula based on Cr of 2.18).   Medications:  Scheduled:  . albuterol  2.5 mg Nebulization Q6H   And  . ipratropium  0.5 mg Nebulization Q6H  . antiseptic oral rinse  15 mL Mouth Rinse QID  . docusate sodium  100 mg Oral Daily  . insulin aspart  0-15 Units Subcutaneous TID WC  . metoprolol tartrate  25 mg Oral BID  . pantoprazole (PROTONIX) IV  40 mg Intravenous Daily   . amiodarone (NEXTERONE PREMIX) 360 mg/200 mL dextrose     Followed by  . amiodarone (NEXTERONE PREMIX) 360 mg/200 mL dextrose    . dextrose 5 % and 0.9% NaCl 20 mL/hr at 11/24/13 1146  . heparin 1,300 Units/hr (11/25/13 0623)  . phenylephrine (NEO-SYNEPHRINE) Adult infusion 150 mcg/min (11/25/13 1024)   Assessment: 79 y/o M tx from [place] for ischemic RLE now s/p embolectomy, also developed afib in the OR, to be started on IV heparin.  Spoke to [facility] pharmacy department, confirmed that he received 1 dose of Xarelto 15 mg yesterday (12/11) at ~ 9AM.  Initial heparin level elevated, as expected after receiving Xarelto.  Subsequent heparin levels lower, may be beginning to lose influence of Xarleto. Current PTT in goal range.  No bleeding or complications noted per chart notes.  Goal of Therapy:  PTT  66-102 Heparin level 0.3-0.7 units/ml Monitor platelets by anticoagulation protocol: Yes   Plan:  1. Continue IV heparin at current rate of 1300 units/hr. 2. Will confirm that PTT remains therapeutic in 8 hrs. 3. Will not recheck heparin level until tomorrow AM. 4. Continue daily CBC.  Tad Moore, BCPS  Clinical Pharmacist Pager [phone]  11/25/2013 2:41 PM

## 2013-11-25 NOTE — Progress Notes (Addendum)
   Daily Progress Note  Assessment/Planning: POD #2 s/p R fem TE w/ BPA, Shocky, afib, DM   Adequate flow in R foot  Minimal recovery of R motor function at this point  Appreciate PCCM's mgmt of this pt.  IMHO, this pt's multiple co-morbidities will be best served on a Hospitalist service once stable for d/c to floor  Subjective  - 2 Days Post-Op  Pain in R leg better  Objective Filed Vitals:   11/25/13 0730 11/25/13 0745 11/25/13 0800 11/25/13 0858  BP: 131/82  107/80 93/54  Pulse:   27 64  Temp:  97.8 F (36.6 C)    TempSrc:  Oral    Resp: 18  19 17   Height:      Weight:      SpO2:   83% 97%    Intake/Output Summary (Last 24 hours) at 11/25/13 1610 Last data filed at 11/25/13 0800  Gross per 24 hour  Intake 2165.27 ml  Output    380 ml  Net 1785.27 ml    PULM  BLL rales CV  Irr, irr; Neo @ 140 mcg/min GI  soft, NTND VASC  R foot warm, dopplerable AT, limited movement R>L  Laboratory CBC    Component Value Date/Time   WBC 12.4* 11/25/2013 0410   HGB 11.6* 11/25/2013 0410   HCT 36.4* 11/25/2013 0410   PLT 183 11/25/2013 0410    BMET    Component Value Date/Time   NA 139 11/25/2013 0410   K 4.5 11/25/2013 0410   CL 104 11/25/2013 0410   CO2 22 11/25/2013 0410   GLUCOSE 76 11/25/2013 0410   BUN 43* 11/25/2013 0410   CREATININE 2.18* 11/25/2013 0410   CALCIUM 8.2* 11/25/2013 0410   GFRNONAA 27* 11/25/2013 0410   GFRAA 31* 11/25/2013 0410    Leonides Sake, MD [facility] [place] Office: [phone] Pager: [phone]  11/25/2013, 9:23 AM

## 2013-11-25 NOTE — Progress Notes (Signed)
ANTICOAGULATION CONSULT NOTE - Follow Up Consult  Pharmacy Consult for Heparin  Indication: atrial fibrillation, s/p embolectomy of femoral artery   No Known Allergies  Patient Measurements: Height: 5\' 7"  (170.2 cm) Weight: 197 lb (89.359 kg) IBW/kg (Calculated) : 66.1 Heparin Dosing Weight: ~85 kg  Vital Signs: Temp: 97.8 F (36.6 C) (12/13 0406) Temp src: Oral (12/13 0406) BP: 105/60 mmHg (12/13 0200) Pulse Rate: 56 (12/13 0200)  Labs:  Recent Labs  11/24/13 0710 11/24/13 1857 11/25/13 0410  HGB 11.3*  --  11.6*  HCT 34.4*  --  36.4*  PLT 123*  --  183  APTT 77* 49* 44*  HEPARINUNFRC 1.26*  --  0.35  CREATININE 1.89*  --  2.18*   Estimated Creatinine Clearance: 28.8 ml/min (by C-G formula based on Cr of 2.18).  Medications:  Heparin 1150 units/hr  Assessment: 79 y/o M tx from [place] for ischemic RLE now s/p embolectomy, developed afib in the OR, started on IV heparin. Got ONE dose of Xarelto at [facility] 12/11 at ~9am. APTT is 44 and HL is 0.35, which doesn't quite correlate yet, so will continue to use aPTT to guide and get HL's for correlation. No issues per RN, other labs above.   Goal of Therapy:  Heparin level 0.3-0.7 units/ml Monitor platelets by anticoagulation protocol: Yes   Plan:  -Increase heparin drip to 1300 units/hr -8 hour aPTT/HL at 1400 -Daily aPTT/HL -Monitor for bleeding  Thank you for allowing me to take part in this patient's care,  Abran Duke, PharmD Clinical Pharmacist Phone: [phone] Pager: [phone] 11/25/2013 6:10 AM

## 2013-11-25 NOTE — Progress Notes (Signed)
CRITICAL VALUE ALERT  Critical value received:  cbg 69  Date of notification:  11/25/13 Time of notification:  0415  Critical value read back:yes  Nurse who received alert:  Juanda Bond, RN  Treated with Cheree Ditto crackers and orange juice, will recheck in 30 min

## 2013-11-25 NOTE — Progress Notes (Signed)
PT Cancellation Note  Patient Details Name: [name] [name] MRN: [id number] DOB: 02-17-1931   Cancelled Treatment:    Reason Eval/Treat Not Completed: Patient not medically ready . RN asked to hold due to patients HR and BP's unstable. Awaiting to start amiodarone to assist in HR management. PT to re-attempt as able.   Marcene Brawn 11/25/2013, 11:19 AM

## 2013-11-25 NOTE — Progress Notes (Signed)
ANTICOAGULATION CONSULT NOTE - Follow Up Consult  Pharmacy Consult for heparin Indication: DVT, afib  No Known Allergies  Patient Measurements: Height: 5\' 7"  (170.2 cm) Weight: 197 lb (89.359 kg) IBW/kg (Calculated) : 66.1 Heparin Dosing Weight: ~ 85 kg  Vital Signs: Temp: 98.9 F (37.2 C) (12/13 1956) Temp src: Oral (12/13 1956) BP: 127/73 mmHg (12/13 2000) Pulse Rate: 61 (12/13 2005)  Labs:  Recent Labs  11/24/13 0710  11/25/13 0410 11/25/13 1315 11/25/13 2108  HGB 11.3*  --  11.6*  --   --   HCT 34.4*  --  36.4*  --   --   PLT 123*  --  183  --   --   APTT 77*  < > 44* 84* 72*  HEPARINUNFRC 1.26*  --  0.35 0.35  --   CREATININE 1.89*  --  2.18*  --   --   < > = values in this interval not displayed.  Estimated Creatinine Clearance: 28.8 ml/min (by C-G formula based on Cr of 2.18).   Medications:  Scheduled:  . antiseptic oral rinse  15 mL Mouth Rinse QID  . docusate sodium  100 mg Oral Daily  . insulin aspart  0-15 Units Subcutaneous TID WC  . ipratropium  0.5 mg Nebulization Q6H WA  . levalbuterol  0.63 mg Nebulization Q6H WA  . metoprolol tartrate  25 mg Oral BID  . pantoprazole (PROTONIX) IV  40 mg Intravenous Daily   . amiodarone (NEXTERONE PREMIX) 360 mg/200 mL dextrose 30 mg/hr (11/25/13 1914)  . dextrose 5 % and 0.9% NaCl 20 mL/hr at 11/24/13 1146  . heparin 1,300 Units/hr (11/25/13 0623)  . phenylephrine (NEO-SYNEPHRINE) Adult infusion 120 mcg/min (11/25/13 1808)   Assessment: 79 y/o M tx from [place] for ischemic RLE now s/p embolectomy, also developed afib in the OR, to be started on IV heparin.  Spoke to [facility] pharmacy department, confirmed that he received 1 dose of Xarelto 15 mg (12/11 @ 0900). Initial heparin level elevated, as expected after receiving Xarelto.  Subsequent heparin levels lower, may be beginning to lose influence of Xarleto. Confirmatory aPTT is therapeutic at 72.  No bleeding or complications noted per chart  notes.  Goal of Therapy:  PTT 66-102 Heparin level 0.3-0.7 units/ml Monitor platelets by anticoagulation protocol: Yes   Plan:  1) Continue IV heparin at current rate of 1300 units/hr 2) Follow up heparin level and aPTT in AM 3) Can probably start using just heparin levels after tomorrow  Louie Casa, Pharm D, BCPS   11/25/2013 10:00 PM

## 2013-11-25 NOTE — Progress Notes (Signed)
CRITICAL VALUE ALERT  Critical value received:  CBG 54  Date of notification:  11/24/13  Time of notification:  2345  Critical value read back:no  Nurse who received alert:  Juanda Bond, RN  MD notified (1st page):   Time of first page:    MD notified (2nd page):  Time of second page:  Responding MD:    Time MD responded:   Treated with Cup orange juice and will recheck cbg in 30 min

## 2013-11-25 NOTE — Progress Notes (Signed)
CRITICAL VALUE ALERT  Critical value received:  CBG 46  Date of notification:  11/25/13  Time of notification:  0020  Critical value read back:no  Nurse who received alert:  Juanda Bond, RN  MD notified (1st page):    Time of first page:    MD notified (2nd page):  Time of second page:  Responding MD:    Time MD responded:    Treated with tube of glucose gel. Pt asymptomatic . Will recheck in 30 minutes

## 2013-11-26 DIAGNOSIS — Z48812 Encounter for surgical aftercare following surgery on the circulatory system: Secondary | ICD-10-CM

## 2013-11-26 LAB — BASIC METABOLIC PANEL
CO2: 22 mEq/L (ref 19–32)
GFR calc Af Amer: 41 mL/min — ABNORMAL LOW (ref >90)
GFR calc non Af Amer: 36 mL/min — ABNORMAL LOW (ref >90)
Potassium: 3.9 mEq/L (ref 3.5–5.1)
Sodium: 134 mEq/L — ABNORMAL LOW (ref 135–145)

## 2013-11-26 LAB — CBC
Hemoglobin: 11.3 g/dL — ABNORMAL LOW (ref 13.0–17.0)
MCH: 31 pg (ref 26.0–34.0)
RBC: 3.65 MIL/uL — ABNORMAL LOW (ref 4.22–5.81)
RDW: 14.6 % (ref 11.5–15.5)
WBC: 10.5 10*3/uL (ref 4.0–10.5)

## 2013-11-26 LAB — GLUCOSE, CAPILLARY
Glucose-Capillary: 129 mg/dL — ABNORMAL HIGH (ref 70–99)
Glucose-Capillary: 157 mg/dL — ABNORMAL HIGH (ref 70–99)
Glucose-Capillary: 134 mg/dL — ABNORMAL HIGH (ref 70–99)

## 2013-11-26 LAB — APTT: aPTT: 100 seconds — ABNORMAL HIGH (ref 24–37)

## 2013-11-26 MED ORDER — FLEET ENEMA 7-19 GM/118ML RE ENEM
1.0000 | ENEMA | Freq: Once | RECTAL | Status: AC
  Administered 2013-11-26: 14:00:00 via RECTAL
  Filled 2013-11-26: qty 1

## 2013-11-26 MED ORDER — CHLORPROMAZINE HCL 25 MG/ML IJ SOLN
25.0000 mg | Freq: Once | INTRAMUSCULAR | Status: AC
  Administered 2013-11-26: 25 mg via INTRAMUSCULAR
  Filled 2013-11-26: qty 1

## 2013-11-26 NOTE — Significant Event (Signed)
Refractory hiccups.  Will give dose of thorazine x one.  Coralyn Helling, MD [facility] Pulmonary/Critical Care 11/26/2013, 2:33 PM Pager:  [phone] After 3pm call: [phone]

## 2013-11-26 NOTE — Evaluation (Signed)
Physical Therapy Evaluation Patient Details Name: [name] [name] MRN: [id number] DOB: 27-Mar-1931 Today's Date: 11/26/2013 Time: 2956-2130 PT Time Calculation (min): 30 min  PT Assessment / Plan / Recommendation History of Present Illness  Pt transferred from [facility] for R LE embolectomy.  Clinical Impression  Pt extremely deconditioned with R LE swelling and pain. Pt currently requiring maxA x2 for all mobility. Pt currently unsafe to return home with spouse due to increased amount of assist pt requires. Pt to benefit from ST-SNF upon d/c form hospital to achieve maximal functional recovery for safe transition home.     PT Assessment  Patient needs continued PT services    Follow Up Recommendations  SNF;Supervision/Assistance - 24 hour    Does the patient have the potential to tolerate intense rehabilitation      Barriers to Discharge        Equipment Recommendations  None recommended by PT    Recommendations for Other Services     Frequency Min 3X/week    Precautions / Restrictions Precautions Precautions: Fall Restrictions Weight Bearing Restrictions: No   Pertinent Vitals/Pain 8/10 R LE pain with mvmt/mobility      Mobility  Bed Mobility Bed Mobility: Supine to Sit;Sit to Supine Supine to Sit: 1: +2 Total assist;HOB flat Supine to Sit: Patient Percentage: 30% Sit to Supine: 1: +2 Total assist;HOB flat Sit to Supine: Patient Percentage: 20% Details for Bed Mobility Assistance: pt required assist for both LE management and trunk elevation. Pt with extremely labored effort, +SOB Transfers Transfers: Sit to Stand;Stand to Sit Sit to Stand: 1: +2 Total assist;With upper extremity assist Sit to Stand: Patient Percentage: 20% Stand to Sit: 1: +2 Total assist;With upper extremity assist;To bed Stand to Sit: Patient Percentage: 20% Details for Transfer Assistance: pt limited by + SOB and R LE pain. unable to tolerate prolonged standing or achieve full upright  posture Ambulation/Gait Ambulation/Gait Assistance: Not tested (comment)    Exercises     PT Diagnosis: Difficulty walking;Generalized weakness  PT Problem List: Decreased strength;Decreased activity tolerance;Decreased balance;Decreased mobility PT Treatment Interventions: DME instruction;Gait training;Functional mobility training;Therapeutic activities;Therapeutic exercise;Balance training     PT Goals(Current goals can be found in the care plan section) Acute Rehab PT Goals Patient Stated Goal: to get better PT Goal Formulation: With patient Time For Goal Achievement: 12/10/13 Potential to Achieve Goals: Good  Visit Information  Last PT Received On: 11/26/13 Assistance Needed: +2 History of Present Illness: Pt transferred from [facility] for R LE embolectomy.       Prior Functioning  Home Living Family/patient expects to be discharged to:: Skilled nursing facility Prior Function Level of Independence: Needs assistance Gait / Transfers Assistance Needed: used rollator ADL's / Homemaking Assistance Needed: assist for dressing and bathing Dominant Hand: Right    Cognition  Cognition Arousal/Alertness: Awake/alert Behavior During Therapy: WFL for tasks assessed/performed Overall Cognitive Status: Within Functional Limits for tasks assessed    Extremity/Trunk Assessment Upper Extremity Assessment Upper Extremity Assessment: Generalized weakness (L>R) Lower Extremity Assessment Lower Extremity Assessment: Generalized weakness (R PROM/AROM greatly limited by pain) Cervical / Trunk Assessment Cervical / Trunk Assessment: Normal   Balance Balance Balance Assessed: Yes Static Sitting Balance Static Sitting - Balance Support: Feet supported;Bilateral upper extremity supported Static Sitting - Level of Assistance: 2: Max assist Static Sitting - Comment/# of Minutes: maxA to support L UE to support self. Pt with strong L lateral lean and unable to self correct  End of  Session PT - End of  Session Equipment Utilized During Treatment: Gait belt Activity Tolerance: Patient limited by pain;Patient limited by fatigue Patient left: in bed;with call bell/phone within reach Nurse Communication: Mobility status  GP     Marcene Brawn 11/26/2013, 12:42 PM   Lewis Shock, PT, DPT Pager #: [phone] Office #: [phone]

## 2013-11-26 NOTE — Progress Notes (Signed)
VASCULAR LAB PRELIMINARY  ARTERIAL  ABI completed:    RIGHT    LEFT    PRESSURE WAVEFORM  PRESSURE WAVEFORM  BRACHIAL 112 Triphasic BRACHIAL Not obtained due to severe swelling and erythema   DP 106 Monophasic DP 91 Monophasic  PT 87 Monophasic PT 95 Monophasic    RIGHT LEFT  ABI 0.95 0.85   Technically difficult due to hiccups and rapid heart rate. ABIs on the right indicate normal arterial flow Left ABIs indicate a mild reduction in arterial flow. Doppler waveforms were difficult to evaluate due to previous stated limitations  Bosco Paparella, RVS  11/26/2013, 3:54 PM

## 2013-11-26 NOTE — Progress Notes (Signed)
PULMONARY  / CRITICAL CARE MEDICINE  Name: [name] [name] MRN: [id number] DOB: [birth year]    ADMISSION DATE:  11/23/2013 CONSULTATION DATE:  11/24/13  REFERRING MD :  Waverly Ferrari, MD PRIMARY SERVICE: Vascular surgery  CHIEF COMPLAINT:  Post right femoral embolectomy. Intubated.   BRIEF PATIENT DESCRIPTION:  79 year old male with PMH relevant for HTN, DM, A. Fib not on anticoagulation, PVD, COPD. Presents with ischemic right lower extremity. Now post right femoral embolectomy. Out of the OR intubated. Consult called for vent management. Post op course c/b AFRVR, hypotension  SIGNIFICANT EVENTS / STUDIES:  12/12 Right femoral embolectomy. Bovine pericardial patch angioplasty of the right femoral artery 12/12 2D echo>>>EF 60-65%, calcified aortic valve  LINES / TUBES: ETT 12/11 >> 12/12  CULTURES: none  ANTIBIOTICS: Cefuroxime (peri-op) 12/11 >> 12/12   SUBJECTIVE:  HR much improved with amiodarone.  Remains on neo but weaning off.    VITAL SIGNS: Temp:  [97.8 F (36.6 C)-98.9 F (37.2 C)] 98.5 F (36.9 C) (12/14 0800) Pulse Rate:  [25-145] 55 (12/14 1000) Resp:  [15-27] 22 (12/14 1000) BP: (99-155)/(50-111) 103/61 mmHg (12/14 1000) SpO2:  [88 %-100 %] 97 % (12/14 1015) FiO2 (%):  [36 %] 36 % (12/14 1015) HEMODYNAMICS:   VENTILATOR SETTINGS: Vent Mode:  [-]  FiO2 (%):  [36 %] 36 % INTAKE / OUTPUT: Intake/Output     12/13 0701 - 12/14 0700 12/14 0701 - 12/15 0700   P.O. 500 350   I.V. (mL/kg) 2384.9 (26.7) 220.6 (2.5)   IV Piggyback     Total Intake(mL/kg) 2884.9 (32.3) 570.6 (6.4)   Urine (mL/kg/hr) 1660 (0.8) 325 (1)   Drains 35 (0)    Total Output 1695 325   Net +1189.9 +245.6          PHYSICAL EXAMINATION: General: chronically ill appearing male, NAD  Neuro: awake, alert, appropriate, MAE CV:  s1s2 irreg, tachycardic 110-160 PULM: resps even non labored, exp wheeze  GI: abd distended, soft, hypoactive bs  Extremities:  Warm and dry, scant BLE  edema    LABS:  Recent Labs Lab 11/23/13 2355  11/24/13 0710 11/25/13 0410 11/26/13 0415  NA  --   --  139 139 134*  K  --   < > 4.2 4.5 3.9  CL  --   --  106 104 99  CO2  --   --  23 22 22   GLUCOSE 48*  --  213* 76 138*  BUN  --   --  41* 43* 37*  CREATININE  --   --  1.89* 2.18* 1.72*  CALCIUM  --   --  7.8* 8.2* 8.4  < > = values in this interval not displayed.   CXR:   No new CXR 12/14  ASSESSMENT / PLAN:  PULMONARY A: VDRF - resolved  Smoking history - suspected COPD P:   Continue nebulized BDs pulm hygiene  Int f/u cxr   CARDIOVASCULAR A:  AFib RVR Hypotension Chronic beta blocker use P:  Transition to PO scheduled metoprolol  PRN metoprolol to maintain HR < 115/min Continue wean neo to off  Continue amiodarone  ?cards will need to see at some point - had outpt cardiologist in [place] who retired and has not seen anyone since  PRN lopressor    RENAL A:   Elevated Cr. Baseline unknown. Suspect CKD P:   Monitor BMET intermittently Correct electrolytes as indicated  GASTROINTESTINAL A:   No issues P:   SUP: IV PPI  tol PO diet   HEMATOLOGIC A:   Acute RLE thrombus/embolus - s/p embolectomy P:  DVT px: full heparin per pharmacy Monitor CBC intermittently Per VVS   INFECTIOUS A:   No evidence of acute infection P:   Micro and abx as above Trend wbc   ENDOCRINE A:   DM 2 P:   Cont SSI  NEUROLOGIC A:   Post op pain P:   PRN fentanyl  Cont monitor in ICU while on pressors.  Vascular prefers tx to [facility] once out of unit with multiple medical issues   Danford Bad, NP 11/26/2013  10:33 AM Pager: [phone]  Reviewed above, examined pt, and agree with assessment/plan  Improved.  Remains on low dose pressors >> likely will be able to wean off later today.  Updated family at bedside.  Coralyn Helling, MD [facility] Pulmonary/Critical Care 11/26/2013, 1:48 PM Pager:  [phone] After 3pm  call: [phone]

## 2013-11-26 NOTE — Clinical Documentation Improvement (Signed)
THIS DOCUMENT IS NOT A PERMANENT PART OF THE MEDICAL RECORD  Please update your documentation with the medical record to reflect your response to this query. If you need help knowing how to do this please call [phone].  11/26/13   Dr. Imogene Burn,  In a better effort to capture your patient's severity of illness, reflect appropriate length of stay and utilization of resources, a review of the patient medical record has revealed the following information:   - "Shocky" documented in surgical progress notes   - Neo-Synephrine gtt. intraoperatively and postoperatively, attempting to wean.   - Systolic BP's in the 80's and 09'W   Based on your clinical judgment, please document in the progress notes and discharge summary if a condition below provides greater specificity regarding "shocky".   - Shock, including type, if known   - Other Condition   - Unable to Clinically Determine   In responding to this query please exercise your independent judgment.    The fact that a query is asked, does not imply that any particular answer is desired or expected.   You may use Possible, Probable, Likely or Suspected with inpatient documentation.  However, Possible, Probable, Likely or Suspected diagnoses must be documented as such in the discharge summary unless confirmed or ruled out during the admission.  Reviewed: additional documentation in the medical record  Thank You,  Jerral Ralph  RN BSN CCDS Certified Clinical Documentation Specialist: [phone] Health Information Management [place]

## 2013-11-26 NOTE — Progress Notes (Addendum)
   Daily Progress Note  Assessment/Planning: POD #3 s/p R fem TE w/ BPA, Shock, afib, DM  Adequate R leg perfusion Motor exam unchanged D/C JP Continue weaning vasopressor support Amio drip added Appreciate PCCM's assistance  Subjective  - 3 Days Post-Op  No complaints  Objective Filed Vitals:   11/26/13 0500 11/26/13 0600 11/26/13 0700 11/26/13 0730  BP: 149/77 150/111 143/90 135/68  Pulse: 25 132 145 74  Temp:      TempSrc:      Resp: 21 22 21 18   Height:      Weight:      SpO2: 98% 98% 97% 98%    Intake/Output Summary (Last 24 hours) at 11/26/13 0750 Last data filed at 11/26/13 0700  Gross per 24 hour  Intake 2884.87 ml  Output   1695 ml  Net 1189.87 ml   PULM BLL rales  CV Irr, irr; Neo @ 100 mcg/min , Amio @ 30 mg/hr GI soft, NTND  VASC R foot warm, dopplerable AT, limited movement R>L, JP 35 cc/24 hr   Laboratory CBC    Component Value Date/Time   WBC 10.5 11/26/2013 0415   HGB 11.3* 11/26/2013 0415   HCT 34.4* 11/26/2013 0415   PLT 163 11/26/2013 0415    BMET    Component Value Date/Time   NA 134* 11/26/2013 0415   K 3.9 11/26/2013 0415   CL 99 11/26/2013 0415   CO2 22 11/26/2013 0415   GLUCOSE 138* 11/26/2013 0415   BUN 37* 11/26/2013 0415   CREATININE 1.72* 11/26/2013 0415   CALCIUM 8.4 11/26/2013 0415   GFRNONAA 36* 11/26/2013 0415   GFRAA 41* 11/26/2013 0415    Leonides Sake, MD [facility] [place] Office: [phone] Pager: [phone]  11/26/2013, 7:50 AM

## 2013-11-26 NOTE — Progress Notes (Signed)
ANTICOAGULATION CONSULT NOTE - Follow Up Consult  Pharmacy Consult for heparin Indication: DVT, afib  No Known Allergies  Patient Measurements: Height: 5\' 7"  (170.2 cm) Weight: 197 lb (89.359 kg) IBW/kg (Calculated) : 66.1 Heparin Dosing Weight: ~ 85 kg  Vital Signs: Temp: 97.9 F (36.6 C) (12/14 1127) Temp src: Oral (12/14 1127) BP: 109/63 mmHg (12/14 1130) Pulse Rate: 134 (12/14 1130)  Labs:  Recent Labs  11/24/13 0710  11/25/13 0410 11/25/13 1315 11/25/13 2108 11/26/13 0415  HGB 11.3*  --  11.6*  --   --  11.3*  HCT 34.4*  --  36.4*  --   --  34.4*  PLT 123*  --  183  --   --  163  APTT 77*  < > 44* 84* 72* 100*  HEPARINUNFRC 1.26*  --  0.35 0.35  --  0.36  CREATININE 1.89*  --  2.18*  --   --  1.72*  < > = values in this interval not displayed.  Estimated Creatinine Clearance: 36.5 ml/min (by C-G formula based on Cr of 1.72).   Medications:  Scheduled:  . antiseptic oral rinse  15 mL Mouth Rinse QID  . docusate sodium  100 mg Oral Daily  . insulin aspart  0-15 Units Subcutaneous TID WC  . ipratropium  0.5 mg Nebulization Q6H WA  . levalbuterol  0.63 mg Nebulization Q6H WA  . metoprolol tartrate  25 mg Oral BID  . pantoprazole (PROTONIX) IV  40 mg Intravenous Daily  . sodium phosphate  1 enema Rectal Once   . amiodarone (NEXTERONE PREMIX) 360 mg/200 mL dextrose 30 mg/hr (11/26/13 0105)  . dextrose 5 % and 0.9% NaCl 20 mL/hr at 11/24/13 1146  . heparin 1,300 Units/hr (11/25/13 0623)  . phenylephrine (NEO-SYNEPHRINE) Adult infusion 90 mcg/min (11/26/13 0900)   Assessment: 79 y/o M tx from [place] for ischemic RLE now s/p embolectomy, also developed afib in the OR, to be started on IV heparin.  Spoke to [facility] pharmacy department, confirmed that he received 1 dose of Xarelto 15 mg (12/11 @ 0900). Initial heparin level elevated, as expected after receiving Xarelto.    This AM heparin level and PTT are both within goal ranges.  I believe heparin  level is now reliable, and will switch to solely monitoring heparin levels.   No bleeding or complications noted per chart notes.  Goal of Therapy:  PTT 66-102 Heparin level 0.3-0.7 units/ml Monitor platelets by anticoagulation protocol: Yes   Plan:  1) Continue IV heparin at current rate of 1300 units/hr 2) Will only monitor heparin levels from this point. 3) AM heparin level and CBC. 4) F/u plans to begin oral anticoagulation.  Tad Moore, BCPS  Clinical Pharmacist Pager [phone]  11/26/2013 12:04 PM

## 2013-11-27 ENCOUNTER — Inpatient Hospital Stay (HOSPITAL_COMMUNITY): Payer: Medicare HMO

## 2013-11-27 DIAGNOSIS — I999 Unspecified disorder of circulatory system: Secondary | ICD-10-CM

## 2013-11-27 LAB — CBC
HCT: 32 % — ABNORMAL LOW (ref 39.0–52.0)
Hemoglobin: 10.5 g/dL — ABNORMAL LOW (ref 13.0–17.0)
MCH: 31.1 pg (ref 26.0–34.0)
MCHC: 32.8 g/dL (ref 30.0–36.0)
MCV: 94.7 fL (ref 78.0–100.0)
Platelets: 127 10*3/uL — ABNORMAL LOW (ref 150–400)
Platelets: 136 10*3/uL — ABNORMAL LOW (ref 150–400)
RBC: 3.5 MIL/uL — ABNORMAL LOW (ref 4.22–5.81)
RBC: 3.38 MIL/uL — ABNORMAL LOW (ref 4.22–5.81)
RDW: 14.7 % (ref 11.5–15.5)
WBC: 7.4 10*3/uL (ref 4.0–10.5)
WBC: 8.2 10*3/uL (ref 4.0–10.5)

## 2013-11-27 LAB — BASIC METABOLIC PANEL
CO2: 23 mEq/L (ref 19–32)
Chloride: 101 mEq/L (ref 96–112)
Creatinine, Ser: 1.42 mg/dL — ABNORMAL HIGH (ref 0.50–1.35)
Glucose, Bld: 132 mg/dL — ABNORMAL HIGH (ref 70–99)
Potassium: 3.7 mEq/L (ref 3.5–5.1)
Sodium: 135 mEq/L (ref 135–145)

## 2013-11-27 LAB — HEMOGLOBIN A1C: Hgb A1c MFr Bld: 5.5 % (ref <5.7)

## 2013-11-27 LAB — URINALYSIS, ROUTINE W REFLEX MICROSCOPIC
Glucose, UA: 100 mg/dL — AB
Ketones, ur: NEGATIVE mg/dL
Nitrite: NEGATIVE
Specific Gravity, Urine: 1.018 (ref 1.005–1.030)
pH: 5 (ref 5.0–8.0)

## 2013-11-27 LAB — TSH: TSH: 0.725 u[IU]/mL (ref 0.350–4.500)

## 2013-11-27 LAB — GLUCOSE, CAPILLARY
Glucose-Capillary: 130 mg/dL — ABNORMAL HIGH (ref 70–99)
Glucose-Capillary: 141 mg/dL — ABNORMAL HIGH (ref 70–99)
Glucose-Capillary: 200 mg/dL — ABNORMAL HIGH (ref 70–99)

## 2013-11-27 LAB — CBC WITH DIFFERENTIAL/PLATELET
Eosinophils Absolute: 0.2 10*3/uL (ref 0.0–0.7)
HCT: 33.6 % — ABNORMAL LOW (ref 39.0–52.0)
Hemoglobin: 11.1 g/dL — ABNORMAL LOW (ref 13.0–17.0)
Lymphocytes Relative: 9 % — ABNORMAL LOW (ref 12–46)
Lymphs Abs: 0.7 10*3/uL (ref 0.7–4.0)
MCH: 31.5 pg (ref 26.0–34.0)
MCV: 95.5 fL (ref 78.0–100.0)
Monocytes Absolute: 0.8 10*3/uL (ref 0.1–1.0)
Monocytes Relative: 10 % (ref 3–12)
Neutrophils Relative %: 79 % — ABNORMAL HIGH (ref 43–77)
Platelets: 136 10*3/uL — ABNORMAL LOW (ref 150–400)
RBC: 3.52 MIL/uL — ABNORMAL LOW (ref 4.22–5.81)
WBC: 7.5 10*3/uL (ref 4.0–10.5)

## 2013-11-27 LAB — URINE MICROSCOPIC-ADD ON

## 2013-11-27 LAB — HEPARIN LEVEL (UNFRACTIONATED): Heparin Unfractionated: 0.14 IU/mL — ABNORMAL LOW (ref 0.30–0.70)

## 2013-11-27 MED ORDER — AMIODARONE IV BOLUS ONLY 150 MG/100ML: 150.0000 mg | Freq: Once | INTRAVENOUS | Status: DC

## 2013-11-27 MED ORDER — LACTULOSE 10 GM/15ML PO SOLN
20.0000 g | Freq: Every day | ORAL | Status: DC | PRN
  Administered 2013-11-27: 20 g via ORAL
  Filled 2013-11-27: qty 30

## 2013-11-27 MED ORDER — PANTOPRAZOLE SODIUM 40 MG PO TBEC
40.0000 mg | DELAYED_RELEASE_TABLET | Freq: Every day | ORAL | Status: DC
  Administered 2013-11-27 – 2013-12-03 (×7): 40 mg via ORAL
  Filled 2013-11-27 (×7): qty 1

## 2013-11-27 MED ORDER — AMIODARONE LOAD VIA INFUSION
150.0000 mg | Freq: Once | INTRAVENOUS | Status: AC
  Administered 2013-11-27: 150 mg via INTRAVENOUS
  Filled 2013-11-27: qty 83.34

## 2013-11-27 MED ORDER — SODIUM CHLORIDE 0.9 % IV SOLN
12.5000 mg | Freq: Once | INTRAVENOUS | Status: AC
  Administered 2013-11-27: 12.5 mg via INTRAVENOUS
  Filled 2013-11-27: qty 0.5

## 2013-11-27 MED ORDER — PHENYLEPHRINE HCL 10 MG/ML IJ SOLN
30.0000 ug/min | INTRAVENOUS | Status: DC
  Administered 2013-11-28: 30 ug/min via INTRAVENOUS
  Filled 2013-11-27 (×2): qty 4

## 2013-11-27 NOTE — Progress Notes (Addendum)
ANTICOAGULATION CONSULT NOTE - Follow Up Consult  Pharmacy Consult for Heparin Indication: atrial fibrillation and DVT  No Known Allergies  Patient Measurements: Height: 5\' 7"  (170.2 cm) Weight: 197 lb (89.359 kg) IBW/kg (Calculated) : 66.1 Heparin Dosing Weight: 85 kg  Vital Signs: Temp: 97.9 F (36.6 C) (12/15 0405) Temp src: Oral (12/15 0405) BP: 117/68 mmHg (12/15 0730) Pulse Rate: 107 (12/15 0730)  Labs:  Recent Labs  11/25/13 0410 11/25/13 1315 11/25/13 2108 11/26/13 0415 11/27/13 0414 11/27/13 0710  HGB 11.6*  --   --  11.3*  --   --   HCT 36.4*  --   --  34.4*  --   --   PLT 183  --   --  163  --   --   APTT 44* 84* 72* 100*  --   --   HEPARINUNFRC 0.35 0.35  --  0.36 0.11* 0.14*  CREATININE 2.18*  --   --  1.72* 1.42*  --     Estimated Creatinine Clearance: 44.2 ml/min (by C-G formula based on Cr of 1.42).   Medications:  Infusions:  . amiodarone (NEXTERONE PREMIX) 360 mg/200 mL dextrose 30 mg/hr (11/27/13 0221)  . dextrose 5 % and 0.9% NaCl 20 mL/hr at 11/26/13 2300  . heparin 1,300 Units/hr (11/26/13 2300)  . phenylephrine (NEO-SYNEPHRINE) Adult infusion 90 mcg/min (11/27/13 0100)    Assessment: 79 year old male currently receiving anticoagulation for ischemic right lower extremity s/p embolectomy and atrial fibrillation.  His heparin level is subtherapeutic today.  I expect his heparin level from 12/14 was still influenced by his Xarelto dose received at [facility] 12/11, and today's decrease is evidence of additional Xarelto clearance.  He is experiencing hematuria, and a CBC was not checked today.  Goal of Therapy:  Heparin level 0.3-0.7 units/ml Monitor platelets by anticoagulation protocol: Yes   Plan:  Increase Heparin to 1600 units/hr Check Heparin level and CBC in 8 hours Daily Heparin level and CBC F/u hematuria  Estella Husk, Pharm.D., BCPS, AAHIVP Clinical Pharmacist Phone: [phone] or [phone] 11/27/2013, 7:55 AM  11/27/2013,  2:52 PM:  Heparin off from 12noon until now for central line placement.  Spoke with Dr. Tyson Alias - ok to restart.  CBC ok from 0940 this morning. Will reschedule heparin level accordingly.

## 2013-11-27 NOTE — Procedures (Signed)
Central Venous Catheter Insertion Procedure Note Dean Goldner [id number] [birth year]  Procedure: Insertion of Central Venous Catheter Indications: Assessment of intravascular volume, Drug and/or fluid administration and Frequent blood sampling  Procedure Details Consent: Risks of procedure as well as the alternatives and risks of each were explained to the (patient/caregiver).  Consent for procedure obtained. Time Out: Verified patient identification, verified procedure, site/side was marked, verified correct patient position, special equipment/implants available, medications/allergies/relevent history reviewed, required imaging and test results available.  Performed  Maximum sterile technique was used including antiseptics, cap, gloves, gown, hand hygiene, mask and sheet. Skin prep: Chlorhexidine; local anesthetic administered A antimicrobial bonded/coated triple lumen catheter was placed in the left internal jugular vein using the Seldinger technique. Ultrasound guidance used.yes Catheter placed to 20 cm. Blood aspirated via all 3 ports and then flushed x 3. Line sutured x 2 and dressing applied.  Evaluation Blood flow good Complications: No apparent complications Patient did tolerate procedure well. Chest X-ray ordered to verify placement.  CXR: pending.  Brett Canales Minor ACNP Adolph Pollack PCCM Pager [phone] till 3 pm If no answer page [phone] 11/27/2013, 1:25 PM   Tolerated well  [place] gudiance  Mcarthur Rossetti. Tyson Alias, MD, FACP Pgr: [phone] [facility]

## 2013-11-27 NOTE — Evaluation (Signed)
Occupational Therapy Evaluation Patient Details Name: [name] [name] MRN: [id number] DOB: 1931/01/02 Today's Date: 11/27/2013 Time: 4098-1191 OT Time Calculation (min): 36 min  OT Assessment / Plan / Recommendation History of present illness Pt transferred from [facility] for R LE embolectomy. Remained on vent 12/11-12/12.   Clinical Impression   Pt admitted with above.  He presents to OT with the below listed deficits.   Eval was limited to EOB activity due to increased dyspnea, irregular HR, and Lt. Hand blanching in dependent position.  Abdomen very tight and causing increased difficulty with breathing when pt sitting upright.  Feel he will benefit from continued OT to maximize safety and independence with BADLs to allow him to maximize safety and independence with BADLs.  He will need SNF level rehab at discharge.     OT Assessment  Patient needs continued OT Services    Follow Up Recommendations  SNF;Supervision/Assistance - 24 hour    Barriers to Discharge Decreased caregiver support wife unable to perform current level of assist  Equipment Recommendations  None recommended by OT    Recommendations for Other Services    Frequency  Min 2X/week    Precautions / Restrictions Precautions Precautions: Fall Required Braces or Orthoses: Other Brace/Splint Other Brace/Splint: pt normally uses Lt double-upright [name] (currently at home; too much edema to use) Restrictions Weight Bearing Restrictions: No   Pertinent Vitals/Pain     ADL  Eating/Feeding: Minimal assistance Where Assessed - Eating/Feeding: Bed level Grooming: Wash/dry hands;Wash/dry face;Teeth care;Minimal assistance Where Assessed - Grooming: Supine, head of bed up Upper Body Bathing: Maximal assistance Where Assessed - Upper Body Bathing: Supine, head of bed up Lower Body Bathing: +1 Total assistance Where Assessed - Lower Body Bathing: Supine, head of bed up;Rolling right and/or left Upper Body Dressing:  Maximal assistance Where Assessed - Upper Body Dressing: Supine, head of bed up;Supported sitting Lower Body Dressing: +1 Total assistance Where Assessed - Lower Body Dressing: Supine, head of bed up;Rolling right and/or left Toilet Transfer: +1 Total assistance (unable to perform safely ) Toileting - Clothing Manipulation and Hygiene: +1 Total assistance Where Assessed - Toileting Clothing Manipulation and Hygiene: Supine, head of bed flat;Rolling right and/or left ADL Comments: Pt limited by resp status - audible wheezing when sitting EOB    OT Diagnosis: Generalized weakness;Hemiplegia non-dominant side  OT Problem List: Decreased strength;Decreased activity tolerance;Impaired balance (sitting and/or standing);Decreased coordination;Decreased knowledge of use of DME or AE;Cardiopulmonary status limiting activity;Obesity;Increased edema OT Treatment Interventions: Self-care/ADL training;DME and/or AE instruction;Therapeutic activities;Patient/family education;Balance training   OT Goals(Current goals can be found in the care plan section) Acute Rehab OT Goals Patient Stated Goal: to get better OT Goal Formulation: With patient Time For Goal Achievement: 12/11/13 Potential to Achieve Goals: Good ADL Goals Pt Will Perform Grooming: with set-up;sitting Pt Will Perform Upper Body Bathing: with min assist;sitting Pt Will Perform Lower Body Bathing: with mod assist;sit to/from stand Pt Will Transfer to Toilet: with max assist;bedside commode;squat pivot transfer;stand pivot transfer Pt Will Perform Toileting - Clothing Manipulation and hygiene: with max assist;sitting/lateral leans Additional ADL Goal #1: Pt will actively participate in 20 mins therapeutic acitivity with no more than one rest to increase activity tolerance   Visit Information  Last OT Received On: 11/27/13 Assistance Needed: +2 PT/OT/SLP Co-Evaluation/Treatment: Yes Reason for Co-Treatment: Complexity of the patient's  impairments (multi-system involvement) PT goals addressed during session: Mobility/safety with mobility OT goals addressed during session: Other (comment);Strengthening/ROM (functional mobility ) History of Present Illness: Pt transferred  from [facility] for R LE embolectomy. Remained on vent 12/11-12/12.       Prior Functioning     Home Living Family/patient expects to be discharged to:: Skilled nursing facility Prior Function Level of Independence: Needs assistance Gait / Transfers Assistance Needed: used rollator ADL's / Homemaking Assistance Needed: assist for dressing and bathing Comments: Pt reports wife has assisted him for some time Communication Communication: No difficulties Dominant Hand: Right         Vision/Perception Vision - History Patient Visual Report: No change from baseline Praxis Praxis: Impaired Praxis-Other Comments: Pt requires max cues to identify Rt. and Lt. side   Cognition  Cognition Arousal/Alertness: Awake/alert Behavior During Therapy: WFL for tasks assessed/performed Overall Cognitive Status: Within Functional Limits for tasks assessed Memory: Decreased short-term memory (per pt, he forgets things quickly)    Extremity/Trunk Assessment Upper Extremity Assessment Upper Extremity Assessment: LUE deficits/detail LUE Deficits / Details: Pt with h/o weakness due to stroke.  Pt able to use Lt UE as gross assist.  He demonstrates gross grasp and release.  he is able to perform ~75% hand to mouth and demonstrates ~50* shoulder elevation 3+ edema noted Lt. UE to bicep level.  While sitting EOB, fingertips were blue with fingers MCPs - to DIPs white.  Upon returning to supine, color returned to normal.  RN notified LUE Coordination: decreased fine motor;decreased gross motor Lower Extremity Assessment Lower Extremity Assessment: Defer to PT evaluation Cervical / Trunk Assessment Cervical / Trunk Assessment: Other exceptions Cervical / Trunk  Exceptions: Pt with passive elongation of Lt. trunk with pt leaning to Lt - probable due to old CVA.  Abdomen distended     Mobility Bed Mobility Bed Mobility: Supine to Sit;Sitting - Scoot to Edge of Bed;Sit to Supine Supine to Sit: 1: +2 Total assist;HOB elevated Supine to Sit: Patient Percentage: 20% Sitting - Scoot to Edge of Bed: 2: Max assist Sit to Supine: 1: +2 Total assist;HOB flat Sit to Supine: Patient Percentage: 20% Details for Bed Mobility Assistance: pt required assist for both LE management and trunk elevation. Pt with extremely labored effort, +SOB Transfers Transfers: Not assessed Details for Transfer Assistance: pt limited by dyspnea; lateral scoot along EOB towards his Rt with pt able to weight shift forward over his feet and put weight through his legs (minimally)      Exercise General Exercises - Lower Extremity Ankle Circles/Pumps: AROM;Right;AAROM;Left;10 reps;Supine Long Arc Quad: AROM;Right;5 reps;Seated;Limitations Long [employer] Limitations: <3/5 strength as unable to fully extend knee   Balance Balance Balance Assessed: Yes Static Sitting Balance Static Sitting - Balance Support: Left upper extremity supported;Feet supported Static Sitting - Level of Assistance: 3: Mod assist Static Sitting - Comment/# of Minutes: pt with Lt lateral lean (?partially due to abdominal distension); able to achieve unassisted sitting up to 45 seconds x2 (minguard)   End of Session OT - End of Session Activity Tolerance: Treatment limited secondary to medical complications (Comment) Patient left: in bed;with call bell/phone within reach (bed in chair position ) Nurse Communication: Mobility status;Other (comment) (changes with Lt. UE and edema)  GO     [name] [name] M 11/27/2013, 1:17 PM

## 2013-11-27 NOTE — Progress Notes (Signed)
PULMONARY  / CRITICAL CARE MEDICINE  Name: [name] [name] MRN: [id number] DOB: [birth year]    ADMISSION DATE:  11/23/2013 CONSULTATION DATE:  11/24/13  REFERRING MD :  Waverly Ferrari, MD PRIMARY SERVICE: Vascular surgery  CHIEF COMPLAINT:  Post right femoral embolectomy. Intubated.   BRIEF PATIENT DESCRIPTION:  79 year old male with PMH relevant for HTN, DM, A. Fib not on anticoagulation, PVD, COPD. Presents with ischemic right lower extremity. Now post right femoral embolectomy. Out of the OR intubated. Consult called for vent management. Post op course c/b AFRVR, hypotension  SIGNIFICANT EVENTS / STUDIES:  12/12 Right femoral embolectomy. Bovine pericardial patch angioplasty of the right femoral artery 12/12 2D echo>>>EF 60-65%, calcified aortic valve 12/13 amio  LINES / TUBES: ETT 12/11 >> 12/12  CULTURES: none  ANTIBIOTICS: Cefuroxime (peri-op) 12/11 >> 12/12   SUBJECTIVE:  neo remains  VITAL SIGNS: Temp:  [97.6 F (36.4 C)-99.3 F (37.4 C)] 97.6 F (36.4 C) (12/15 0800) Pulse Rate:  [27-154] 105 (12/15 1200) Resp:  [15-27] 24 (12/15 1200) BP: (94-135)/(49-97) 132/86 mmHg (12/15 1200) SpO2:  [87 %-100 %] 100 % (12/15 1200) HEMODYNAMICS:   VENTILATOR SETTINGS:   INTAKE / OUTPUT: Intake/Output     12/14 0701 - 12/15 0700 12/15 0701 - 12/16 0700   P.O. 350 350   I.V. (mL/kg) 1818.1 (20.3) 414.7 (4.6)   Total Intake(mL/kg) 2168.1 (24.3) 764.7 (8.6)   Urine (mL/kg/hr) 2125 (1) 445 (0.9)   Drains     Total Output 2125 445   Net +43.1 +319.7          PHYSICAL EXAMINATION: General: chronically ill appearing male, NAD  Neuro: awake, alert, appropriate, MAE CV:  s1s2 irreg, tachycardic 110-130 PULM: coarse GI: abd distended, soft, hypoactive bs  Extremities:  Warm and dry, scant BLE edema    LABS:  Recent Labs Lab 11/23/13 2355  11/24/13 0710 11/25/13 0410 11/26/13 0415 11/27/13 0414  NA  --   --  139 139 134* 135  K  --   < > 4.2 4.5 3.9 3.7   CL  --   --  106 104 99 101  CO2  --   --  23 22 22 23   GLUCOSE 48*  --  213* 76 138* 132*  BUN  --   --  41* 43* 37* 30*  CREATININE  --   --  1.89* 2.18* 1.72* 1.42*  CALCIUM  --   --  7.8* 8.2* 8.4 8.6  < > = values in this interval not displayed.   CXR:   No new CXR 12/14  ASSESSMENT / PLAN:  PULMONARY A: VDRF - resolved  Smoking history - suspected COPD P:   Continue nebulized BDs pulm hygiene  Int f/u cxr Control rate to avoid edema  CARDIOVASCULAR A:  AFib RVR Hypotension Chronic beta blocker use P:  Dc metoprolol , need to get off pressors and wil use amio PRN metoprolol to maintain HR < 115/min, dc as amio has improved rate Continue wean neo to MAP 55 and sys 90 Continue amiodarone, may need bolus PRN lopressor dc May need line, turn hep off for now tsh  RENAL A:   Elevated Cr. Baseline unknown. Suspect CKD P:   Monitor BMET intermittently crt down, may consider dig in future also if needed  GASTROINTESTINAL A:   No issues P:   SUP: IV PPI tol PO diet  Enema provided, lactulose x 1  HEMATOLOGIC A:   Acute RLE thrombus/embolus - s/p embolectomy  P:  DVT px: full heparin per pharmacy, hold for line Monitor CBC intermittently Per [facility]   INFECTIOUS A:   No evidence of acute infection P:   Micro and abx as above Trend wbc   ENDOCRINE A:   DM 2 P:   Cont SSI  NEUROLOGIC A:   Post op pain P:   PRN fentanyl  Ccm time 30 min   amio bolus, tsh, dc metoprolol, line  Mcarthur Rossetti. Tyson Alias, MD, FACP Pgr: [phone] [facility]

## 2013-11-27 NOTE — Progress Notes (Signed)
Drug/Drug Interaction:  Your patient has been ordered chlorpromazine this evening and is currently on Amiodarone.  Please note drug/drug interaction below.  Additive QT interval prolongation may occur during coadministration of ondansetron and chlorproMAZINE, amiodarone, amiodarone (NEXTERONE PREMIX) 360 mg/200 mL dextrose.  Last EKG on 12/13, reveals a QTc of .  Recommendation:  Consider short term use only if alternative cannot be given and monitor closely for QTc prolongation.  Nadara Mustard, PharmD., MS Clinical Pharmacist Pager:  [phone] Thank you for allowing pharmacy to be part of this patients care team.

## 2013-11-27 NOTE — Progress Notes (Signed)
Physical Therapy Treatment Patient Details Name: [name] [name] MRN: [id number] DOB: 1931/06/26 Today's Date: 11/27/2013 Time: 4098-1191 PT Time Calculation (min): 39 min  PT Assessment / Plan / Recommendation  History of Present Illness Pt transferred from [facility] for R LE embolectomy. Remained on vent 12/11-12/12.   PT Comments   Pt very motivated, however limited by incr dyspnea, very irregular HR, and Lt hand blanching in dependent position. Pt's abdomen very tight and causing difficulty breathing in full upright sitting. Pt with decreased work of breathing when returned to supine and then semi-fowler's position. RN made aware of all changes limiting his mobility.    Follow Up Recommendations  LTACH;Supervision for mobility/OOB     Does the patient have the potential to tolerate intense rehabilitation     Barriers to Discharge        Equipment Recommendations  None recommended by PT    Recommendations for Other Services    Frequency Min 3X/week   Progress towards PT Goals Progress towards PT goals: Progressing toward goals  Plan Discharge plan needs to be updated    Precautions / Restrictions Precautions Precautions: Fall Required Braces or Orthoses: Other Brace/Splint Other Brace/Splint: pt normally uses Lt double-upright [name] (currently at home; too much edema to use) Restrictions Weight Bearing Restrictions: No (right leg vascular surgery, no restrictions)   Pertinent Vitals/Pain HR 88-140 SaO2 94-96% RR 20-23 BP 133/89 sitting    Mobility  Bed Mobility Bed Mobility: Supine to Sit;Sitting - Scoot to Edge of Bed;Sit to Supine Supine to Sit: 1: +2 Total assist;HOB elevated Supine to Sit: Patient Percentage: 20% Sitting - Scoot to Edge of Bed: 2: Max assist Sit to Supine: 1: +2 Total assist;HOB flat Sit to Supine: Patient Percentage: 20% Details for Bed Mobility Assistance: pt required assist for both LE management and trunk elevation. Pt with extremely  labored effort, +SOB Transfers Transfers: Lateral/Scoot Transfers Lateral/Scoot Transfers: 1: +2 Total assist Lateral Transfers: Patient Percentage: 10% Details for Transfer Assistance: pt limited by dyspnea; lateral scoot along EOB towards his Rt with pt able to weight shift forward over his feet and put weight through his legs (minimally)     Exercises General Exercises - Lower Extremity Ankle Circles/Pumps: AROM;Right;AAROM;Left;10 reps;Supine Long Arc Quad: AROM;Right;5 reps;Seated;Limitations Long [employer] Limitations: <3/5 strength as unable to fully extend knee   PT Diagnosis:    PT Problem List:   PT Treatment Interventions:     PT Goals (current goals can now be found in the care plan section)    Visit Information  Last PT Received On: 11/27/13 Assistance Needed: +2 PT/OT/SLP Co-Evaluation/Treatment: Yes Reason for Co-Treatment: Complexity of the patient's impairments (multi-system involvement) PT goals addressed during session: Mobility/safety with mobility History of Present Illness: Pt transferred from [facility] for R LE embolectomy. Remained on vent 12/11-12/12.    Subjective Data  Subjective: "You're the boss..."   Cognition  Cognition Arousal/Alertness: Awake/alert Behavior During Therapy: WFL for tasks assessed/performed Overall Cognitive Status: Within Functional Limits for tasks assessed Memory: Decreased short-term memory (per pt, he forgets things quickly)    Balance  Static Sitting Balance Static Sitting - Balance Support: Left upper extremity supported;Feet supported Static Sitting - Level of Assistance: 3: Mod assist Static Sitting - Comment/# of Minutes: pt with Lt lateral lean (?partially due to abdominal distension); able to achieve unassisted sitting up to 45 seconds x2 (minguard)  End of Session PT - End of Session Activity Tolerance: Patient limited by fatigue;Other (comment) (incr dyspnea; color changes  Lt fingers) Patient left: in  bed;with call bell/phone within reach;Other (comment) (semi-chair position) Nurse Communication: Other (comment) (dyspnea; dusky Lt fingers in sitting)   GP     Kesley Gaffey 11/27/2013, 12:52 PM Pager [phone]

## 2013-11-27 NOTE — Progress Notes (Signed)
  VASCULAR PROGRESS NOTE  SUBJECTIVE: No specific complaints.  PHYSICAL EXAM: Filed Vitals:   11/27/13 0700 11/27/13 0730 11/27/13 0800 11/27/13 0830  BP: 106/70 117/68 135/88 121/65  Pulse: 98 107 109 27  Temp:   97.6 F (36.4 C)   TempSrc:   Oral   Resp: 17 22 27 25   Height:      Weight:      SpO2: 99% 97% 98% 99%   Both feet are warm and well perfused. RIght suprainguinal incision looks good. Urine blood tinged.   LABS: Lab Results  Component Value Date   WBC 10.5 11/26/2013   HGB 11.3* 11/26/2013   HCT 34.4* 11/26/2013   MCV 94.2 11/26/2013   PLT 163 11/26/2013   Lab Results  Component Value Date   CREATININE 1.42* 11/27/2013   CBG (last 3)   Recent Labs  11/26/13 1653 11/26/13 2347 11/27/13 0759  GLUCAP 134* 141* 130*   ABI's: Right 95%; Left 85%  ECHO: Systolic function is normal. No mention of a cardiac source for embolization.   Active Problems:   Ischemic leg   Atrial fibrillation   Acute respiratory failure with hypoxia   Hypotension  ASSESSMENT AND PLAN:  *  4 Days Post-Op s/p: Right femoral embolectomy. ABI's show good perfusion bilaterally.   *  Anticoagulation: Patient is on IV heparin per pharmacy.   *  Continue PTx.   *  Hematuria: Likely secondary to irritation from foley and heparin, but if it worsens, will need Urology evaluation. His wife says that he does not have a Insurance underwriter.   * This patient has multiple medical issues  (A. Fib, h/o CVA, COPD, DM) will need to be converted to oral anticoagulation for his A fib. I have consulted [facility] in hopes that they will take over his care after he leaves the ICU. From a vasular standpoint he is doing well. He will likely need a temporary SNF.  Cari Caraway Beeper: [phone] 11/27/2013

## 2013-11-28 ENCOUNTER — Inpatient Hospital Stay (HOSPITAL_COMMUNITY): Payer: Medicare HMO

## 2013-11-28 LAB — COMPREHENSIVE METABOLIC PANEL
ALT: 25 U/L (ref 0–53)
Alkaline Phosphatase: 74 U/L (ref 39–117)
BUN: 26 mg/dL — ABNORMAL HIGH (ref 6–23)
CO2: 26 mEq/L (ref 19–32)
Calcium: 8.9 mg/dL (ref 8.4–10.5)
GFR calc Af Amer: 63 mL/min — ABNORMAL LOW (ref >90)
GFR calc non Af Amer: 54 mL/min — ABNORMAL LOW (ref >90)
Glucose, Bld: 160 mg/dL — ABNORMAL HIGH (ref 70–99)
Potassium: 4.1 mEq/L (ref 3.5–5.1)
Sodium: 136 mEq/L (ref 135–145)
Total Protein: 6.1 g/dL (ref 6.0–8.3)

## 2013-11-28 LAB — URINE CULTURE
Colony Count: NO GROWTH
Culture: NO GROWTH

## 2013-11-28 LAB — CBC
HCT: 32.8 % — ABNORMAL LOW (ref 39.0–52.0)
Hemoglobin: 10.8 g/dL — ABNORMAL LOW (ref 13.0–17.0)
MCH: 31.3 pg (ref 26.0–34.0)
MCHC: 32.9 g/dL (ref 30.0–36.0)
MCV: 95.1 fL (ref 78.0–100.0)
RBC: 3.45 MIL/uL — ABNORMAL LOW (ref 4.22–5.81)
RDW: 14.7 % (ref 11.5–15.5)

## 2013-11-28 LAB — HEPARIN LEVEL (UNFRACTIONATED)
Heparin Unfractionated: 0.22 IU/mL — ABNORMAL LOW (ref 0.30–0.70)
Heparin Unfractionated: 0.27 IU/mL — ABNORMAL LOW (ref 0.30–0.70)

## 2013-11-28 LAB — GLUCOSE, CAPILLARY
Glucose-Capillary: 174 mg/dL — ABNORMAL HIGH (ref 70–99)
Glucose-Capillary: 169 mg/dL — ABNORMAL HIGH (ref 70–99)

## 2013-11-28 LAB — CORTISOL: Cortisol, Plasma: 19.2 ug/dL

## 2013-11-28 MED ORDER — FUROSEMIDE 10 MG/ML IJ SOLN
40.0000 mg | Freq: Two times a day (BID) | INTRAMUSCULAR | Status: AC
  Administered 2013-11-28 – 2013-11-29 (×3): 40 mg via INTRAVENOUS
  Filled 2013-11-28 (×4): qty 4

## 2013-11-28 MED ORDER — HEPARIN (PORCINE) IN NACL 100-0.45 UNIT/ML-% IJ SOLN
2300.0000 [IU]/h | INTRAMUSCULAR | Status: DC
  Administered 2013-11-29 – 2013-12-02 (×5): 2300 [IU]/h via INTRAVENOUS
  Filled 2013-11-28 (×12): qty 250

## 2013-11-28 MED ORDER — BISACODYL 10 MG RE SUPP
10.0000 mg | Freq: Once | RECTAL | Status: AC
  Administered 2013-11-28: 10 mg via RECTAL
  Filled 2013-11-28: qty 1

## 2013-11-28 MED ORDER — HEPARIN BOLUS VIA INFUSION
1200.0000 [IU] | Freq: Once | INTRAVENOUS | Status: AC
  Administered 2013-11-28: 1200 [IU] via INTRAVENOUS
  Filled 2013-11-28: qty 1200

## 2013-11-28 MED ORDER — POLYETHYLENE GLYCOL 3350 17 G PO PACK
17.0000 g | PACK | Freq: Every day | ORAL | Status: DC
  Administered 2013-11-28 – 2013-11-29 (×2): 17 g via ORAL
  Filled 2013-11-28 (×3): qty 1

## 2013-11-28 NOTE — Progress Notes (Signed)
ANTICOAGULATION CONSULT NOTE - Follow Up Consult  Pharmacy Consult for Heparin Indication: atrial fibrillation and DVT  No Known Allergies  Patient Measurements: Height: 5\' 7"  (170.2 cm) Weight: 197 lb (89.359 kg) IBW/kg (Calculated) : 66.1 Heparin Dosing Weight: 85 kg  Vital Signs: Temp: 98.1 F (36.7 C) (12/16 0812) Temp src: Oral (12/16 0812) BP: 128/58 mmHg (12/16 1000) Pulse Rate: 118 (12/16 1000)  Labs:  Recent Labs  11/25/13 1315 11/25/13 2108 11/26/13 0415 11/27/13 0414 11/27/13 0710  11/27/13 1300 11/27/13 2000 11/27/13 2340 11/28/13 0400 11/28/13 0800  HGB  --   --  11.3*  --   --   < > 11.1* 10.5*  --  10.8*  --   HCT  --   --  34.4*  --   --   < > 33.6* 32.0*  --  32.8*  --   PLT  --   --  163  --   --   < > 136* 136*  --  123*  --   APTT 84* 72* 100*  --   --   --   --   --   --   --   --   HEPARINUNFRC 0.35  --  0.36 0.11* 0.14*  --   --   --  0.22*  --  0.27*  CREATININE  --   --  1.72* 1.42*  --   --   --   --   --  1.22  --   < > = values in this interval not displayed.  Estimated Creatinine Clearance: 51.5 ml/min (by C-G formula based on Cr of 1.22).   Medications:  Infusions:  . amiodarone (NEXTERONE PREMIX) 360 mg/200 mL dextrose 30 mg/hr (11/28/13 1000)  . dextrose 5 % and 0.9% NaCl Stopped (11/28/13 1000)  . heparin 1,750 Units/hr (11/28/13 1000)  . phenylephrine (NEO-SYNEPHRINE) Adult infusion Stopped (11/28/13 0915)    Assessment: 79 year old male currently receiving anticoagulation for ischemic right lower extremity s/p embolectomy and atrial fibrillation.  His heparin level is subtherapeutic today. Yesterday's decrease in HL is likely from Xarelto clearance. CBC stable.   Goal of Therapy:  Heparin level 0.3-0.7 units/ml Monitor platelets by anticoagulation protocol: Yes   Plan:  Heparin bolus 1200 units x1 Increase Heparin to 1900 units/hour Check Heparin level in 6 hours Daily Heparin level and CBC

## 2013-11-28 NOTE — Progress Notes (Signed)
   VASCULAR PROGRESS NOTE  SUBJECTIVE: Comfortable   PHYSICAL EXAM: Filed Vitals:   11/28/13 1000 11/28/13 1100 11/28/13 1300 11/28/13 1318  BP: 128/58 112/72 127/67   Pulse: 118 103 112   Temp:      TempSrc:      Resp: 22 25 26    Height:      Weight:      SpO2: 88% 96% 100% 100%   Both feet warm and well perfused. Right suprainguinal incision looks good.   LABS: Lab Results  Component Value Date   WBC 8.7 11/28/2013   HGB 10.8* 11/28/2013   HCT 32.8* 11/28/2013   MCV 95.1 11/28/2013   PLT 123* 11/28/2013   Lab Results  Component Value Date   CREATININE 1.22 11/28/2013   CBG (last 3)   Recent Labs  11/27/13 2158 11/28/13 0811 11/28/13 1142  GLUCAP 172* 168* 190*    Active Problems:   Ischemic leg   Atrial fibrillation   Acute respiratory failure with hypoxia   Hypotension  ASSESSMENT AND PLAN:  *  5 Days Post-Op s/p: right femoral embolectomy  * Anticoagulation for A. Fib: on IV heparin per pharmacy. Will leave decision for long term anticoagulation to Medical team.   * Continue PTx.   * Renal function stable   Cari Caraway Beeper: [phone] 11/28/2013

## 2013-11-28 NOTE — Progress Notes (Signed)
ANTICOAGULATION CONSULT NOTE - Follow Up Consult  Pharmacy Consult for Heparin Indication: atrial fibrillation and DVT  No Known Allergies  Patient Measurements: Height: 5\' 7"  (170.2 cm) Weight: 197 lb (89.359 kg) IBW/kg (Calculated) : 66.1 Heparin Dosing Weight: 85 kg  Vital Signs: Temp: 100.5 F (38.1 C) (12/16 1556) Temp src: Oral (12/16 1556) BP: 118/57 mmHg (12/16 1600) Pulse Rate: 103 (12/16 1700)  Labs:  Recent Labs  11/25/13 2108  11/26/13 0415 11/27/13 0414  11/27/13 1300 11/27/13 2000 11/27/13 2340 11/28/13 0400 11/28/13 0800 11/28/13 1610  HGB  --   --  11.3*  --   < > 11.1* 10.5*  --  10.8*  --   --   HCT  --   --  34.4*  --   < > 33.6* 32.0*  --  32.8*  --   --   PLT  --   --  163  --   < > 136* 136*  --  123*  --   --   APTT 72*  --  100*  --   --   --   --   --   --   --   --   HEPARINUNFRC  --   < > 0.36 0.11*  < >  --   --  0.22*  --  0.27* 0.20*  CREATININE  --   --  1.72* 1.42*  --   --   --   --  1.22  --   --   < > = values in this interval not displayed.  Estimated Creatinine Clearance: 51.5 ml/min (by C-G formula based on Cr of 1.22).   Medications:  Infusions:  . amiodarone (NEXTERONE PREMIX) 360 mg/200 mL dextrose 30 mg/hr (11/28/13 1700)  . dextrose 5 % and 0.9% NaCl Stopped (11/28/13 1000)  . heparin 1,900 Units/hr (11/28/13 1700)  . phenylephrine (NEO-SYNEPHRINE) Adult infusion Stopped (11/28/13 0915)    Assessment: 78 year old male currently receiving anticoagulation for ischemic right lower extremity s/p embolectomy and atrial fibrillation.  Yesterday's decrease in HL is likely from Xarelto clearance. Heparin level is still subtherapeutic tonight.   Goal of Therapy:  Heparin level 0.3-0.7 units/ml Monitor platelets by anticoagulation protocol: Yes   Plan:   Increase heparin to 2100 units/hr Check level in AM

## 2013-11-28 NOTE — Progress Notes (Signed)
PULMONARY  / CRITICAL CARE MEDICINE  Name: [name] [name] MRN: [id number] DOB: 1931/10/03    ADMISSION DATE:  11/23/2013 CONSULTATION DATE:  11/24/13  REFERRING MD :  Waverly Ferrari, MD PRIMARY SERVICE: Vascular surgery  CHIEF COMPLAINT:  Post right femoral embolectomy. Intubated.   BRIEF PATIENT DESCRIPTION:  79 year old male with PMH relevant for HTN, DM, A. Fib not on anticoagulation, PVD, COPD. Presents with ischemic right lower extremity. Now post right femoral embolectomy. Out of the OR intubated. Consult called for vent management. Post op course c/b AFRVR, hypotension  SIGNIFICANT EVENTS / STUDIES:  12/12 Right femoral embolectomy. Bovine pericardial patch angioplasty of the right femoral artery 12/12 2D echo>>>EF 60-65%, calcified aortic valve 12/13 amio 12/16-remains on neo  LINES / TUBES: ETT 12/11 >> 12/12 12/15 ij>>>  CULTURES: none  ANTIBIOTICS: Cefuroxime (peri-op) 12/11 >> 12/12   SUBJECTIVE:  Neo low dose  VITAL SIGNS: Temp:  [96.4 F (35.8 C)-98.4 F (36.9 C)] 98.1 F (36.7 C) (12/16 0812) Pulse Rate:  [26-154] 79 (12/16 0700) Resp:  [18-29] 23 (12/16 0700) BP: (91-151)/(45-116) 115/70 mmHg (12/16 0700) SpO2:  [92 %-100 %] 96 % (12/16 0700) HEMODYNAMICS:   VENTILATOR SETTINGS:   INTAKE / OUTPUT: Intake/Output     12/15 0701 - 12/16 0700 12/16 0701 - 12/17 0700   P.O. 700    I.V. (mL/kg) 2020.9 (22.6)    Total Intake(mL/kg) 2720.9 (30.5)    Urine (mL/kg/hr) 1925 (0.9)    Total Output 1925     Net +795.9            PHYSICAL EXAMINATION: General: chronically ill appearing male, NAD in chair Neuro: awake, alert, appropriate, MAE CV:  s1s2 irreg, tachycardic 120 PULM: coarse improved GI: abd distended, soft, hypoactive bs  Extremities:  Warm and dry, scant BLE edema    LABS:  Recent Labs Lab 11/24/13 0710 11/25/13 0410 11/26/13 0415 11/27/13 0414 11/28/13 0400  NA 139 139 134* 135 136  K 4.2 4.5 3.9 3.7 4.1  CL 106 104  99 101 102  CO2 23 22 22 23 26   GLUCOSE 213* 76 138* 132* 160*  BUN 41* 43* 37* 30* 26*  CREATININE 1.89* 2.18* 1.72* 1.42* 1.22  CALCIUM 7.8* 8.2* 8.4 8.6 8.9     CXR:   No new CXR 12/14  ASSESSMENT / PLAN:  PULMONARY A: VDRF - resolved  Smoking history - suspected COPD Edema with pos balance 12/16 P:   Continue nebulized BDs pulm hygiene  Int f/u cxr Control rate to avoid edema, and add lasix low dose  CARDIOVASCULAR A:  AFib RVR Hypotension Chronic beta blocker use P:  Would favor metoprolol once off neo, may have some wd  Continue wean neo to MAP 55 and sys 90 - likely to off today Continue amiodarone drip load PRN lopressor dc Assess cvp Lasix low dose May add low dose dig Low threshold amio bolus tsh wnl  RENAL A:   Elevated Cr. Baseline unknown. Suspect CKD P:   Monitor BMET in am  Chem resolving Lasix low dose  GASTROINTESTINAL A:   No issues P:   SUP: IV PPI tol PO diet  Enema provided, lactulose no result Add myrlax dulx supp  HEMATOLOGIC A:   Acute RLE thrombus/embolus - s/p embolectomy P:  DVT px: full heparin per pharmacy, home xeralto consider in am after crt resolving Monitor CBC intermittently Per VVS   INFECTIOUS A:   No evidence of acute infection P:   Micro and  abx as above Trend wbc   ENDOCRINE A:   DM 2 P:   Cont SSI  NEUROLOGIC A:   Post op pain P:   PRN fentanyl  Ccm time 30 min   Goal neo to off, cvp assessment, lasix  Mcarthur Rossetti. Tyson Alias, MD, FACP Pgr: [phone] [facility]

## 2013-11-28 NOTE — Progress Notes (Signed)
ANTICOAGULATION CONSULT NOTE  Pharmacy Consult for Heparin Indication: atrial fibrillation and DVT  No Known Allergies  Patient Measurements: Height: 5\' 7"  (170.2 cm) Weight: 197 lb (89.359 kg) IBW/kg (Calculated) : 66.1 Heparin Dosing Weight: 85 kg  Vital Signs: Temp: 98.4 F (36.9 C) (12/15 2355) Temp src: Oral (12/15 2355) BP: 113/58 mmHg (12/16 0000) Pulse Rate: 104 (12/16 0000)  Labs:  Recent Labs  11/25/13 0410 11/25/13 1315 11/25/13 2108 11/26/13 0415 11/27/13 0414 11/27/13 0710 11/27/13 0940 11/27/13 1300 11/27/13 2000 11/27/13 2340  HGB 11.6*  --   --  11.3*  --   --  10.8* 11.1* 10.5*  --   HCT 36.4*  --   --  34.4*  --   --  33.4* 33.6* 32.0*  --   PLT 183  --   --  163  --   --  127* 136* 136*  --   APTT 44* 84* 72* 100*  --   --   --   --   --   --   HEPARINUNFRC 0.35 0.35  --  0.36 0.11* 0.14*  --   --   --  0.22*  CREATININE 2.18*  --   --  1.72* 1.42*  --   --   --   --   --     Estimated Creatinine Clearance: 44.2 ml/min (by C-G formula based on Cr of 1.42).  Assessment: 79 year old male with atrial fibrillation and s/p embolectomy for heparin  Goal of Therapy:  Heparin level 0.3-0.7 units/ml Monitor platelets by anticoagulation protocol: Yes   Plan:  Increase Heparin 1750 units/hr Follow-up am labs.  Geannie Risen, PharmD, BCPS

## 2013-11-29 ENCOUNTER — Inpatient Hospital Stay (HOSPITAL_COMMUNITY): Payer: Medicare HMO

## 2013-11-29 DIAGNOSIS — R5381 Other malaise: Secondary | ICD-10-CM

## 2013-11-29 DIAGNOSIS — I739 Peripheral vascular disease, unspecified: Secondary | ICD-10-CM

## 2013-11-29 LAB — BASIC METABOLIC PANEL
CO2: 27 mEq/L (ref 19–32)
Calcium: 8.9 mg/dL (ref 8.4–10.5)
GFR calc Af Amer: 59 mL/min — ABNORMAL LOW (ref >90)
Glucose, Bld: 159 mg/dL — ABNORMAL HIGH (ref 70–99)
Potassium: 4 mEq/L (ref 3.5–5.1)
Sodium: 137 mEq/L (ref 135–145)

## 2013-11-29 LAB — GLUCOSE, CAPILLARY
Glucose-Capillary: 168 mg/dL — ABNORMAL HIGH (ref 70–99)
Glucose-Capillary: 152 mg/dL — ABNORMAL HIGH (ref 70–99)
Glucose-Capillary: 129 mg/dL — ABNORMAL HIGH (ref 70–99)
Glucose-Capillary: 186 mg/dL — ABNORMAL HIGH (ref 70–99)

## 2013-11-29 LAB — HEPARIN LEVEL (UNFRACTIONATED): Heparin Unfractionated: 0.22 IU/mL — ABNORMAL LOW (ref 0.30–0.70)

## 2013-11-29 LAB — CBC
HCT: 30.5 % — ABNORMAL LOW (ref 39.0–52.0)
Hemoglobin: 10 g/dL — ABNORMAL LOW (ref 13.0–17.0)
Platelets: 123 10*3/uL — ABNORMAL LOW (ref 150–400)
RBC: 3.22 MIL/uL — ABNORMAL LOW (ref 4.22–5.81)
RDW: 14.4 % (ref 11.5–15.5)
WBC: 6.4 10*3/uL (ref 4.0–10.5)

## 2013-11-29 LAB — MAGNESIUM: Magnesium: 1.9 mg/dL (ref 1.5–2.5)

## 2013-11-29 LAB — PHOSPHORUS: Phosphorus: 1.7 mg/dL — ABNORMAL LOW (ref 2.3–4.6)

## 2013-11-29 MED ORDER — WARFARIN SODIUM 5 MG PO TABS
5.0000 mg | ORAL_TABLET | Freq: Once | ORAL | Status: AC
  Administered 2013-11-29: 5 mg via ORAL
  Filled 2013-11-29: qty 1

## 2013-11-29 MED ORDER — WARFARIN VIDEO: Freq: Once | Status: DC

## 2013-11-29 MED ORDER — WARFARIN - PHARMACIST DOSING INPATIENT
Freq: Every day | Status: DC
  Administered 2013-11-30 – 2013-12-02 (×3)

## 2013-11-29 MED ORDER — COUMADIN BOOK
Freq: Once | Status: AC
  Administered 2013-11-29: 16:00:00
  Filled 2013-11-29 (×2): qty 1

## 2013-11-29 MED ORDER — METOPROLOL TARTRATE 25 MG PO TABS
25.0000 mg | ORAL_TABLET | Freq: Two times a day (BID) | ORAL | Status: DC
  Administered 2013-11-29 (×2): 25 mg via ORAL
  Filled 2013-11-29 (×4): qty 1

## 2013-11-29 NOTE — Progress Notes (Signed)
Occupational Therapy Treatment Patient Details Name: [name] [name] MRN: [id number] DOB: 1931-04-02 Today's Date: 11/29/2013 Time: 9629-5284 OT Time Calculation (min): 46 min  OT Assessment / Plan / Recommendation  History of present illness Pt transferred from [facility] for R LE embolectomy. Remained on vent 12/11-12/12; post-op hypotension requiring pressors   OT comments  Pt significantly improved this date.  He was able to maintain EOB sitting with supervision, and was able to stand with total A +2 (pt ~50%), but he did fatigue.  HR max 158 with MD in room.  Pt. Is very motivated to improve and feel he would be able to progress to min A level with CIR to allow him to return home with spouse.  Recommend  CIR  Follow Up Recommendations  CIR;Supervision/Assistance - 24 hour    Barriers to Discharge       Equipment Recommendations  None recommended by OT    Recommendations for Other Services    Frequency Min 2X/week   Progress towards OT Goals Progress towards OT goals: Progressing toward goals  Plan Discharge plan needs to be updated    Precautions / Restrictions Precautions Precautions: Fall Required Braces or Orthoses: Other Brace/Splint Other Brace/Splint: pt normally uses Lt double-upright [name] (currently at home; too much edema to use) Restrictions Weight Bearing Restrictions: No   Pertinent Vitals/Pain     ADL  Toilet Transfer: +2 Total assistance Toilet Transfer: Patient Percentage: 50% Toilet Transfer Method: Sit to stand    OT Diagnosis:    OT Problem List:   OT Treatment Interventions:     OT Goals(current goals can now be found in the care plan section) Acute Rehab OT Goals Time For Goal Achievement: 12/11/13 Potential to Achieve Goals: Good ADL Goals Pt Will Perform Grooming: with set-up;sitting Pt Will Perform Upper Body Bathing: with min assist;sitting Pt Will Perform Lower Body Bathing: with mod assist;sit to/from stand Pt Will Transfer to  Toilet: with max assist;bedside commode;squat pivot transfer;stand pivot transfer Pt Will Perform Toileting - Clothing Manipulation and hygiene: with max assist;sitting/lateral leans Additional ADL Goal #1: Pt will actively participate in 20 mins therapeutic acitivity with no more than one rest to increase activity tolerance   Visit Information  Last OT Received On: 11/29/13 Assistance Needed: +2 PT/OT/SLP Co-Evaluation/Treatment: Yes Reason for Co-Treatment: Complexity of the patient's impairments (multi-system involvement);For patient/therapist safety PT goals addressed during session: Mobility/safety with mobility;Balance;Proper use of DME;Strengthening/ROM OT goals addressed during session: Other (comment) (functional transfers) History of Present Illness: Pt transferred from [facility] for R LE embolectomy. Remained on vent 12/11-12/12; post-op hypotension requiring pressors    Subjective Data      Prior Functioning       Cognition  Cognition Arousal/Alertness: Awake/alert Behavior During Therapy: WFL for tasks assessed/performed Overall Cognitive Status: Within Functional Limits for tasks assessed    Mobility  Bed Mobility Bed Mobility: Supine to Sit;Sitting - Scoot to Edge of Bed;Sit to Supine Supine to Sit: 1: +2 Total assist;HOB elevated Supine to Sit: Patient Percentage: 30% Sitting - Scoot to Edge of Bed: 3: Mod assist Sit to Supine: 1: +2 Total assist;HOB flat Sit to Supine: Patient Percentage: 30% Details for Bed Mobility Assistance: pt required assist for both LE management and trunk elevation.  Transfers Sit to Stand: 1: +2 Total assist;From elevated surface Sit to Stand: Patient Percentage: 50% Stand to Sit: 1: +2 Total assist Stand to Sit: Patient Percentage: 50% Details for Transfer Assistance: stood with +2 assist for hip/trunk extension x 90  seconds; returned to sitting due to weakness in legs    Exercises  General Exercises - Lower Extremity Long  Arc Quad: AROM;Right;AAROM;Left;10 reps;Seated Long Arc Quad Limitations:  (able to take slight resistance RLE; assist to lift LLE fully) Heel Slides: AROM;Right;AAROM;Left;5 reps;Seated;Limitations Heel Slides Limitations: seated pullling feet back/underneath him; limited ROM 2/2 edema/arthritis   Balance Balance Balance Assessed: Yes Static Sitting Balance Static Sitting - Balance Support: No upper extremity supported;Feet supported Static Sitting - Level of Assistance: 4: Min assist Static Sitting - Comment/# of Minutes: able to correct to midline from leaning to Rt with vc; able to perform LE exercises while maintaining balance with minguard assist Static Standing Balance Static Standing - Balance Support: Bilateral upper extremity supported Static Standing - Level of Assistance: 1: +2 Total assist   End of Session OT - End of Session Equipment Utilized During Treatment: Rolling walker Activity Tolerance: Patient tolerated treatment well Patient left: in bed;with call bell/phone within reach Nurse Communication: Mobility status  GO     [name] [name] M 11/29/2013, 2:04 PM

## 2013-11-29 NOTE — Progress Notes (Signed)
ANTICOAGULATION CONSULT NOTE - Initial Consult  Pharmacy Consult for heparin Indication: atrial fibrillation and DVT  No Known Allergies  Patient Measurements: Height: 5\' 7"  (170.2 cm) Weight: 217 lb 2.5 oz (98.5 kg) IBW/kg (Calculated) : 66.1  Vital Signs: Temp: 97.6 F (36.4 C) (12/17 0820) Temp src: Oral (12/17 0820) BP: 114/93 mmHg (12/17 0835) Pulse Rate: 139 (12/17 0835)  Labs:  Recent Labs  11/27/13 0414  11/27/13 2000  11/28/13 0400  11/28/13 1610 11/29/13 0410 11/29/13 1100  HGB  --   < > 10.5*  --  10.8*  --   --  10.0*  --   HCT  --   < > 32.0*  --  32.8*  --   --  30.5*  --   PLT  --   < > 136*  --  123*  --   --  123*  --   HEPARINUNFRC 0.11*  < >  --   < >  --   < > 0.20* 0.22* 0.39  CREATININE 1.42*  --   --   --  1.22  --   --  1.28  --   < > = values in this interval not displayed.  Estimated Creatinine Clearance: 51.5 ml/min (by C-G formula based on Cr of 1.28).   Medical History: Past Medical History  Diagnosis Date  . Hypertension   . Stroke   . COPD (chronic obstructive pulmonary disease)   . Diabetes mellitus without complication   . GERD (gastroesophageal reflux disease)   . A-fib     Medications:  Prescriptions prior to admission  Medication Sig Dispense Refill  . aspirin EC 81 MG tablet Take 81 mg by mouth at bedtime.      Marland Kitchen atorvastatin (LIPITOR) 10 MG tablet Take 10 mg by mouth daily.      . benazepril (LOTENSIN) 10 MG tablet Take 10 mg by mouth 2 (two) times daily.      . furosemide (LASIX) 40 MG tablet Take 60 mg by mouth daily.      Marland Kitchen glipiZIDE (GLUCOTROL XL) 10 MG 24 hr tablet Take 10 mg by mouth 2 (two) times daily.      . metoprolol (LOPRESSOR) 50 MG tablet Take 50 mg by mouth 2 (two) times daily.      . potassium chloride SA (K-DUR,KLOR-CON) 20 MEQ tablet Take 20 mEq by mouth daily.      . ranitidine (ZANTAC) 150 MG tablet Take 150 mg by mouth 2 (two) times daily.      . sitaGLIPtin-metformin (JANUMET) 50-500 MG per tablet  Take 1 tablet by mouth 2 (two) times daily with a meal.      . terazosin (HYTRIN) 5 MG capsule Take 5 mg by mouth every other day.        Assessment: Pt s/p embolectomy for R femoral DVT also has h/o AFib, CHADS2 = 4. H/o stroke 1997. Pt had hematuria on 12/14-12/15, now resolved. CBC today is stable. Pt is being transitioned to warfarin. HL therapeutic.  Goal of Therapy:  INR 2-3 Monitor platelets by anticoagulation protocol: Yes   Plan:  Continue heparin 2300 units/hour Daily CBC Daily HL Next HL 12/18 with AM labs  Agapito Games, PharmD, BCPS Clinical Pharmacist 11/29/2013 1:07 PM

## 2013-11-29 NOTE — Progress Notes (Signed)
Physical Therapy Treatment Patient Details Name: [name] [name] MRN: [id number] DOB: [birth year] Today's Date: 11/29/2013 Time: 2841-3244 PT Time Calculation (min): 41 min  PT Assessment / Plan / Recommendation  History of Present Illness Pt transferred from [facility] for R LE embolectomy. Remained on vent 12/11-12/12; post-op hypotension requiring pressors   PT Comments   Pt's breathing initially much less labored with activity, however after standing x 90 seconds he became very SOB and was unable to attempt standing again as he progressively fatigued while sitting EOB. Pt's HR remains very irregular. Pt highly motivated and feel if his abdominal distention improves, his breathing will improve and he will potentially tolerate the intensity of inpt rehab unit prior to d/c home.   Follow Up Recommendations  CIR;Supervision - Intermittent (very motivated; once medically stable)     Does the patient have the potential to tolerate intense rehabilitation     Barriers to Discharge        Equipment Recommendations  None recommended by PT    Recommendations for Other Services    Frequency Min 3X/week   Progress towards PT Goals Progress towards PT goals: Progressing toward goals  Plan Discharge plan needs to be updated (noted no LTACH benefits)    Precautions / Restrictions Precautions Precautions: Fall Required Braces or Orthoses: Other Brace/Splint Other Brace/Splint: pt normally uses Lt double-upright [name] (currently at home; too much edema to use) Restrictions Weight Bearing Restrictions: No   Pertinent Vitals/Pain SaO2 decr to 86% on 2L as pt fatigued with standing; up to 97% after seated rest x 3 minutes HR 105-151 (irreg)    Mobility  Bed Mobility Bed Mobility: Supine to Sit;Sitting - Scoot to Edge of Bed;Sit to Supine Supine to Sit: 1: +2 Total assist;HOB elevated Supine to Sit: Patient Percentage: 30% Sitting - Scoot to Edge of Bed: 3: Mod assist Sit to Supine: 1:  +2 Total assist;HOB flat Sit to Supine: Patient Percentage: 30% Details for Bed Mobility Assistance: pt required assist for both LE management and trunk elevation.  Transfers Transfers: Sit to Stand;Stand to Sit;Lateral/Scoot Transfers Sit to Stand: 1: +2 Total assist;From elevated surface Sit to Stand: Patient Percentage: 50% Stand to Sit: 1: +2 Total assist Stand to Sit: Patient Percentage: 50% Lateral/Scoot Transfers: 1: +2 Total assist Lateral Transfers: Patient Percentage: 10% Details for Transfer Assistance: stood with +2 assist for hip/trunk extension x 90 seconds; returned to sitting due to weakness in legs    Exercises General Exercises - Lower Extremity Long Arc Quad: AROM;Right;AAROM;Left;10 reps;Seated Long Arc Quad Limitations:  (able to take slight resistance RLE; assist to lift LLE fully) Heel Slides: AROM;Right;AAROM;Left;5 reps;Seated;Limitations Heel Slides Limitations: seated pullling feet back/underneath him; limited ROM 2/2 edema/arthritis   PT Diagnosis:    PT Problem List:   PT Treatment Interventions:     PT Goals (current goals can now be found in the care plan section)    Visit Information  Last PT Received On: 11/29/13 Assistance Needed: +2 PT/OT/SLP Co-Evaluation/Treatment: Yes Reason for Co-Treatment: Complexity of the patient's impairments (multi-system involvement);For patient/therapist safety PT goals addressed during session: Mobility/safety with mobility;Balance;Proper use of DME;Strengthening/ROM History of Present Illness: Pt transferred from [facility] for R LE embolectomy. Remained on vent 12/11-12/12; post-op hypotension requiring pressors    Subjective Data      Cognition  Cognition Arousal/Alertness: Awake/alert Behavior During Therapy: WFL for tasks assessed/performed Overall Cognitive Status: Within Functional Limits for tasks assessed    Balance  Balance Balance Assessed: Yes Static Sitting Balance  Static Sitting -  Balance Support: No upper extremity supported;Feet supported Static Sitting - Level of Assistance: 4: Min assist Static Sitting - Comment/# of Minutes: able to correct to midline from leaning to Rt with vc; able to perform LE exercises while maintaining balance with minguard assist Static Standing Balance Static Standing - Balance Support: Bilateral upper extremity supported Static Standing - Level of Assistance: 1: +2 Total assist  End of Session PT - End of Session Equipment Utilized During Treatment: Gait belt;Oxygen Activity Tolerance: Patient limited by fatigue Patient left: in bed;with call bell/phone within reach Nurse Communication: Mobility status;Need for lift equipment   GP     [name] [name] 11/29/2013, 10:54 AM Pager [phone]

## 2013-11-29 NOTE — Progress Notes (Signed)
ANTICOAGULATION CONSULT NOTE - Initial Consult  Pharmacy Consult for warfarin Indication: atrial fibrillation and DVT  No Known Allergies  Patient Measurements: Height: 5\' 7"  (170.2 cm) Weight: 217 lb 2.5 oz (98.5 kg) IBW/kg (Calculated) : 66.1  Vital Signs: Temp: 97.6 F (36.4 C) (12/17 0820) Temp src: Oral (12/17 0820) BP: 114/93 mmHg (12/17 0835) Pulse Rate: 139 (12/17 0835)  Labs:  Recent Labs  11/27/13 0414  11/27/13 2000  11/28/13 0400 11/28/13 0800 11/28/13 1610 11/29/13 0410  HGB  --   < > 10.5*  --  10.8*  --   --  10.0*  HCT  --   < > 32.0*  --  32.8*  --   --  30.5*  PLT  --   < > 136*  --  123*  --   --  123*  HEPARINUNFRC 0.11*  < >  --   < >  --  0.27* 0.20* 0.22*  CREATININE 1.42*  --   --   --  1.22  --   --  1.28  < > = values in this interval not displayed.  Estimated Creatinine Clearance: 51.5 ml/min (by C-G formula based on Cr of 1.28).   Medical History: Past Medical History  Diagnosis Date  . Hypertension   . Stroke   . COPD (chronic obstructive pulmonary disease)   . Diabetes mellitus without complication   . GERD (gastroesophageal reflux disease)   . A-fib     Medications:  Prescriptions prior to admission  Medication Sig Dispense Refill  . aspirin EC 81 MG tablet Take 81 mg by mouth at bedtime.      Marland Kitchen atorvastatin (LIPITOR) 10 MG tablet Take 10 mg by mouth daily.      . benazepril (LOTENSIN) 10 MG tablet Take 10 mg by mouth 2 (two) times daily.      . furosemide (LASIX) 40 MG tablet Take 60 mg by mouth daily.      Marland Kitchen glipiZIDE (GLUCOTROL XL) 10 MG 24 hr tablet Take 10 mg by mouth 2 (two) times daily.      . metoprolol (LOPRESSOR) 50 MG tablet Take 50 mg by mouth 2 (two) times daily.      . potassium chloride SA (K-DUR,KLOR-CON) 20 MEQ tablet Take 20 mEq by mouth daily.      . ranitidine (ZANTAC) 150 MG tablet Take 150 mg by mouth 2 (two) times daily.      . sitaGLIPtin-metformin (JANUMET) 50-500 MG per tablet Take 1 tablet by mouth  2 (two) times daily with a meal.      . terazosin (HYTRIN) 5 MG capsule Take 5 mg by mouth every other day.        Assessment: Pt s/p embolectomy for R femoral DVT also has h/o AFib, CHADS2 = 4. H/o stroke 1997. Pt had hematuria on 12/14-12/15, now resolved. Pt has been on amiodarone gtt since 12/13. CBC today is stable.  Goal of Therapy:  INR 2-3 Monitor platelets by anticoagulation protocol: Yes   Plan:  Warfarin 5 mg po x1 Daily INR  Agapito Games, PharmD, BCPS Clinical Pharmacist 11/29/2013 10:30 AM

## 2013-11-29 NOTE — Progress Notes (Signed)
I will request approval for a potential inpt rehab admission with [facility] Medicare, BUT very doubtful they will approve. Other rehab options need to be considered. [phone]

## 2013-11-29 NOTE — Progress Notes (Signed)
   VASCULAR PROGRESS NOTE  SUBJECTIVE: No specific complaints  PHYSICAL EXAM: Filed Vitals:   11/29/13 0300 11/29/13 0400 11/29/13 0500 11/29/13 0600  BP: 111/93 110/85 113/78 117/71  Pulse: 106 96 102 87  Temp:  98 F (36.7 C)    TempSrc:  Oral    Resp: 22 26 30 23   Height:      Weight:  217 lb 2.5 oz (98.5 kg)    SpO2: 94% 100% 99% 92%   Both feet warm Right groin incision fine  LABS: Lab Results  Component Value Date   WBC 6.4 11/29/2013   HGB 10.0* 11/29/2013   HCT 30.5* 11/29/2013   MCV 94.7 11/29/2013   PLT 123* 11/29/2013   Lab Results  Component Value Date   CREATININE 1.28 11/29/2013   No results found for this basename: INR, PROTIME   CBG (last 3)   Recent Labs  11/28/13 1142 11/28/13 1530 11/28/13 2147  GLUCAP 190* 174* 169*    Active Problems:   Ischemic leg   Atrial fibrillation   Acute respiratory failure with hypoxia   Hypotension  ASSESSMENT AND PLAN:  * 6 Days Post-Op s/p: Right femoral thrombectomy  * Doing well from a vascular standpoint.  Cari Caraway Beeper: [phone] 11/29/2013

## 2013-11-29 NOTE — Progress Notes (Addendum)
PULMONARY  / CRITICAL CARE MEDICINE  Name: [name] [name] MRN: [id number] DOB: 1931-08-28    ADMISSION DATE:  11/23/2013 CONSULTATION DATE:  11/24/13  REFERRING MD :  Waverly Ferrari, MD PRIMARY SERVICE: Vascular surgery  CHIEF COMPLAINT:  Post right femoral embolectomy. Intubated.   BRIEF PATIENT DESCRIPTION:  79 year old male with PMH relevant for HTN, DM, A. Fib not on anticoagulation, PVD, COPD. Presents with ischemic right lower extremity. Now post right femoral embolectomy. Out of the OR intubated. Consult called for vent management. Post op course c/b AFRVR, hypotension  SIGNIFICANT EVENTS / STUDIES:  12/12 Right femoral embolectomy. Bovine pericardial patch angioplasty of the right femoral artery 12/12 2D echo>>>EF 60-65%, calcified aortic valve 12/13 amio 12/16-remains on neo 12/17- off neo, remains on untitrated amio  LINES / TUBES: ETT 12/11 >> 12/12 12/15 ij>>>  CULTURES: none  ANTIBIOTICS: Cefuroxime (peri-op) 12/11 >> 12/12   SUBJECTIVE:  off pressors, neg 1 liter  VITAL SIGNS: Temp:  [97.6 F (36.4 C)-100.5 F (38.1 C)] 97.6 F (36.4 C) (12/17 0820) Pulse Rate:  [69-155] 139 (12/17 0835) Resp:  [16-31] 22 (12/17 0835) BP: (67-127)/(41-93) 114/93 mmHg (12/17 0835) SpO2:  [91 %-100 %] 94 % (12/17 0845) Weight:  [98.5 kg (217 lb 2.5 oz)] 98.5 kg (217 lb 2.5 oz) (12/17 0400) HEMODYNAMICS: CVP:  [15 mmHg-20 mmHg] 18 mmHg VENTILATOR SETTINGS:   INTAKE / OUTPUT: Intake/Output     12/16 0701 - 12/17 0700 12/17 0701 - 12/18 0700   P.O. 480    I.V. (mL/kg) 932.1 (9.5) 39.7 (0.4)   Total Intake(mL/kg) 1412.1 (14.3) 39.7 (0.4)   Urine (mL/kg/hr) 2445 (1) 60 (0.2)   Total Output 2445 60   Net -1032.9 -20.3          PHYSICAL EXAMINATION: General: more vibrant this am  Neuro: awake, alert, appropriate, MAE CV:  s1s2 irreg, tachy only with exertion PULM: cta GI: abd wnl, soft, wnl bs  Extremities: wnl   LABS:  Recent Labs Lab 11/25/13 0410  11/26/13 0415 11/27/13 0414 11/28/13 0400 11/29/13 0410  NA 139 134* 135 136 137  K 4.5 3.9 3.7 4.1 4.0  CL 104 99 101 102 101  CO2 22 22 23 26 27   GLUCOSE 76 138* 132* 160* 159*  BUN 43* 37* 30* 26* 24*  CREATININE 2.18* 1.72* 1.42* 1.22 1.28  CALCIUM 8.2* 8.4 8.6 8.9 8.9  MG  --   --   --   --  1.9  PHOS  --   --   --   --  1.7*     CXR:   No new CXR 12/14  ASSESSMENT / PLAN:  PULMONARY A: VDRF - resolved  Smoking history - suspected COPD Edema with pos balance 12/16 P:   Improved with lasix, maintain as able IS ambulate  CARDIOVASCULAR A:  AFib RVR Hypotension Chronic beta blocker use P:  consider re add home metoprolol Continue amiodarone drip load, will consider dc this in am to 400 bid Lasix low dose, did well with this, see renal Keep line as on amio still tele  RENAL A:   Elevated Cr. Baseline unknown. Suspect CKD P:   Monitor BMET in am  Lasix low dose, reduce to daily as some rise crt  GASTROINTESTINAL A:   Vomit x 1 this am  Had a BM 12/17 yeah!! P:   SUP: IV PPI tol PO diet  myrlax dulx supp , may repeat May need zofran  HEMATOLOGIC A:   Acute  RLE thrombus/embolus - s/p embolectomy afib P:  DVT px: full heparin per pharmacy, home xeralto consider in am after crt resolving Monitor CBC intermittently Per VVS  Will calculate [place] 2v, and consider anticoagulation, doubt xaralto a good choice with recent renal failure Hep drip remain, add coumadin  INFECTIOUS A:   No evidence of acute infection P:   Follow fever curve  ENDOCRINE A:   DM 2 P:   Cont SSI  NEUROLOGIC A:   Post op pain P:   PRN fentanyl ambulation  To sdu Hep to coumadin today, lasix x 1  Mcarthur Rossetti. Tyson Alias, MD, FACP Pgr: [phone] [facility]

## 2013-11-29 NOTE — Clinical Documentation Improvement (Signed)
THIS DOCUMENT IS NOT A PERMANENT PART OF THE MEDICAL RECORD  Please update your documentation with the medical record to reflect your response to this query. If you need help knowing how to do this please call [phone].  11/29/13  Dr. Tyson Alias,  In a better effort to capture your patient's severity of illness, reflect appropriate length of stay and utilization of resources, a review of the patient medical record has revealed the following information:   - "Renal Failure" documented in progress notes 11/28/13   - Creatinine Trend this admission:  Component     Latest Ref Rng 11/24/2013 11/25/2013 11/26/2013 11/27/2013  Creatinine     0.50 - 1.35 mg/dL 5.62 (H) 1.30 (H) 8.65 (H) 1.42 (H)  GFR calc non Af Amer     >90 mL/min 32 (L) 27 (L) 36 (L) 45 (L)   Component     Latest Ref Rng 11/28/2013 11/29/2013  Creatinine     0.50 - 1.35 mg/dL 7.84 6.96  GFR calc non Af Amer     >90 mL/min 54 (L) 51 (L)    - "Shock" documented by Dr. Imogene Burn.   - Neo. Gtt. Introperatively and Postoperatively until 11/29/13.   Based on your clinical judgment, please document the ACUITY and Cause, if known, of the Renal Failure monitored and treated this admission:   - Acute Renal Failure 2/2 Acute Tubular Necrosis   - Acute Renal Failure   - Other Type of Acuity and Cause   - Unable to Clinically Determine   In responding to this query please exercise your independent judgment.    The fact that a query is asked, does not imply that any particular answer is desired or expected.   You may use Possible, Probable, or Suspected with inpatient documentation.   However, Possible, Probable, or Suspected diagnoses MUST be documented as such in the discharge summary unless confirmed or ruled out during the admission.  Reviewed: additional documentation in the medical record  Thank You,  Jerral Ralph  RN BSN CCDS Certified Clinical Documentation Specialist [phone] Health Information  Management [place]

## 2013-11-29 NOTE — Clinical Documentation Improvement (Signed)
THIS DOCUMENT IS NOT A PERMANENT PART OF THE MEDICAL RECORD  Please update your documentation with the medical record to reflect your response to this query. If you need help knowing how to do this please call [phone].  11/29/13   Dr. Tyson Alias,  In a better effort to capture your patient's severity of illness, reflect appropriate length of stay and utilization of resources, a review of the patient medical record has revealed the following information:   - Pulmonary Edema documented in current medical record   - CXR showing Pulmonary Edema on 1216/14 and 11/29/13.   - Low dose IV Lasix started 11/28/13.   - A-fib with RVR postoperatively requiring Amiodarone   - Echo this admission 11/24/13 - EF 60-65%   - known history of HTN and advanced age   Based on your clinical judgment, please document in the progress notes and discharge summary if a condition below provides greater specificity regarding the Pulmonary Edema documented:   - Acute Pulmonary Edema 2/2 .................................   - Acute Diastolic Heart Failure 2/2 A-fib with RVR   - Other Condition   - Unable to Clinically Determine   In responding to this query please exercise your independent judgment.    The fact that a query is asked, does not imply that any particular answer is desired or expected.  Possible, Probable or Suspected Diagnoses may be documented with inpatient admissions.  However, Possible, Probably or Suspected Diagnoses must be documented as such in the dischage summary unless confirmed or ruled out during the admission.   Reviewed: additional documentation in the medical record  Thank You,  Jerral Ralph  RN  BSN CCDS Certified Clinical Documentation Specialist: [phone] Health Information Management [place]

## 2013-11-29 NOTE — Progress Notes (Signed)
ANTICOAGULATION CONSULT NOTE  Pharmacy Consult for Heparin Indication: atrial fibrillation and DVT  No Known Allergies  Patient Measurements: Height: 5\' 7"  (170.2 cm) Weight: 217 lb 2.5 oz (98.5 kg) IBW/kg (Calculated) : 66.1 Heparin Dosing Weight: 85 kg  Vital Signs: Temp: 98 F (36.7 C) (12/17 0400) Temp src: Oral (12/17 0400) BP: 110/85 mmHg (12/17 0400) Pulse Rate: 96 (12/17 0400)  Labs:  Recent Labs  11/27/13 0414  11/27/13 2000  11/28/13 0400 11/28/13 0800 11/28/13 1610 11/29/13 0410  HGB  --   < > 10.5*  --  10.8*  --   --  10.0*  HCT  --   < > 32.0*  --  32.8*  --   --  30.5*  PLT  --   < > 136*  --  123*  --   --  123*  HEPARINUNFRC 0.11*  < >  --   < >  --  0.27* 0.20* 0.22*  CREATININE 1.42*  --   --   --  1.22  --   --  1.28  < > = values in this interval not displayed.  Estimated Creatinine Clearance: 51.5 ml/min (by C-G formula based on Cr of 1.28).  Assessment: 79 year old male with atrial fibrillation and s/p embolectomy for heparin  Goal of Therapy:  Heparin level 0.3-0.7 units/ml Monitor platelets by anticoagulation protocol: Yes   Plan:  Increase Heparin 2300 units/hr F/U plan for oral anticoagulation  Geannie Risen, PharmD, BCPS

## 2013-11-29 NOTE — Consult Note (Signed)
Physical Medicine and Rehabilitation Consult  Reason for Consult: RLE embolectomy due to ischemic leg/DVT Referring Physician: Dr. Tyson Alias.    HPI: [name] [name] is a 79 y.o. male with history of COPD, DM, PVD, A fib-no anticoagulation. He was admitted via [facility] on 11/23/13 with pulseless RLE with ischemia. He was taken to OR emergently for right femoral embolectomy with patch angioplasty by Dr. Edilia Bo. Post op course complicated by hypotension requiring pressors as well as afib with RVR. He was started on IV heparin and being transitioned to coumadin.  Therapies ongoing and patient limited by dyspnea as well as abdominal distension. MD, PT recommending CIR.    Review of Systems  HENT: Positive for hearing loss.   Eyes: Negative for blurred vision and double vision.  Respiratory: Positive for shortness of breath and wheezing.   Cardiovascular: Negative for chest pain and palpitations.  Gastrointestinal: Negative for nausea and vomiting.  Musculoskeletal: Positive for joint pain (chronic right kee pain). Negative for back pain.  Neurological: Positive for sensory change (RLE) and focal weakness. Negative for headaches.    Past Medical History  Diagnosis Date  . Hypertension   . Stroke   . COPD (chronic obstructive pulmonary disease)   . Diabetes mellitus without complication   . GERD (gastroesophageal reflux disease)   . A-fib    Past Surgical History  Procedure Laterality Date  . Carotid stent    . Parotid gland tumor excision    . Embolectomy Right 11/23/2013    Procedure: Right Femoral EMBOLECTOMY;  Surgeon: Chuck Hint, MD;  Location: [facility] OR;  Service: Vascular;  Laterality: Right;  Right Femoral Embolectomy with Bovine Paricardium patch angioplasty.   No family history on file.  Social History:  Married. Independent with cane/walker PTA. Retired from [employer]. He reports that he has quit smoking. He does not have any smokeless tobacco history on file. He reports  that he does not drink alcohol or use illicit drugs.  Allergies: No Known Allergies  Medications Prior to Admission  Medication Sig Dispense Refill  . aspirin EC 81 MG tablet Take 81 mg by mouth at bedtime.      Marland Kitchen atorvastatin (LIPITOR) 10 MG tablet Take 10 mg by mouth daily.      . benazepril (LOTENSIN) 10 MG tablet Take 10 mg by mouth 2 (two) times daily.      . furosemide (LASIX) 40 MG tablet Take 60 mg by mouth daily.      Marland Kitchen glipiZIDE (GLUCOTROL XL) 10 MG 24 hr tablet Take 10 mg by mouth 2 (two) times daily.      . metoprolol (LOPRESSOR) 50 MG tablet Take 50 mg by mouth 2 (two) times daily.      . potassium chloride SA (K-DUR,KLOR-CON) 20 MEQ tablet Take 20 mEq by mouth daily.      . ranitidine (ZANTAC) 150 MG tablet Take 150 mg by mouth 2 (two) times daily.      . sitaGLIPtin-metformin (JANUMET) 50-500 MG per tablet Take 1 tablet by mouth 2 (two) times daily with a meal.      . terazosin (HYTRIN) 5 MG capsule Take 5 mg by mouth every other day.        Home: Home Living Family/patient expects to be discharged to:: Private residence Living Arrangements: Spouse/significant other  Functional History: Prior Function Comments: Pt reports wife has assisted him for some time Functional Status:  Mobility: Bed Mobility Bed Mobility: Supine to Sit;Sitting - Scoot to Edge of Bed;Sit to Supine  Supine to Sit: 1: +2 Total assist;HOB elevated Supine to Sit: Patient Percentage: 30% Sitting - Scoot to Edge of Bed: 3: Mod assist Sit to Supine: 1: +2 Total assist;HOB flat Sit to Supine: Patient Percentage: 30% Transfers Transfers: Sit to Stand;Stand to Sit;Lateral/Scoot Transfers Sit to Stand: 1: +2 Total assist;From elevated surface Sit to Stand: Patient Percentage: 50% Stand to Sit: 1: +2 Total assist Stand to Sit: Patient Percentage: 50% Lateral/Scoot Transfers: 1: +2 Total assist Lateral Transfers: Patient Percentage: 10% Ambulation/Gait Ambulation/Gait Assistance: Not tested  (comment)    ADL: ADL Eating/Feeding: Minimal assistance Where Assessed - Eating/Feeding: Bed level Grooming: Wash/dry hands;Wash/dry face;Teeth care;Minimal assistance Where Assessed - Grooming: Supine, head of bed up Upper Body Bathing: Maximal assistance Where Assessed - Upper Body Bathing: Supine, head of bed up Lower Body Bathing: +1 Total assistance Where Assessed - Lower Body Bathing: Supine, head of bed up;Rolling right and/or left Upper Body Dressing: Maximal assistance Where Assessed - Upper Body Dressing: Supine, head of bed up;Supported sitting Lower Body Dressing: +1 Total assistance Where Assessed - Lower Body Dressing: Supine, head of bed up;Rolling right and/or left Toilet Transfer: +1 Total assistance (unable to perform safely ) ADL Comments: Pt limited by resp status - audible wheezing when sitting EOB  Cognition: Cognition Overall Cognitive Status: Within Functional Limits for tasks assessed Orientation Level: Oriented X4 Cognition Arousal/Alertness: Awake/alert Behavior During Therapy: WFL for tasks assessed/performed Overall Cognitive Status: Within Functional Limits for tasks assessed Memory: Decreased short-term memory (per pt, he forgets things quickly)  Blood pressure 114/93, pulse 139, temperature 97.6 F (36.4 C), temperature source Oral, resp. rate 22, height 5\' 7"  (1.702 m), weight 217 lb 2.5 oz (98.5 kg), SpO2 94.00%. Physical Exam  Nursing note and vitals reviewed. Constitutional: He is oriented to person, place, and time. He appears well-developed and well-nourished.  HENT:  Head: Normocephalic and atraumatic.  Left Ear: External ear normal.  Eyes: Pupils are equal, round, and reactive to light.  Crusted eye lids. Right ptosis.   Neck: Normal range of motion. Neck supple. No tracheal deviation present. No thyromegaly present.  Cardiovascular: Normal heart sounds.  An irregularly irregular rhythm present. Tachycardia present.   Respiratory: No  accessory muscle usage. He has wheezes. He has rhonchi.  Audible wheezing. Increased effort noted  with conversation and exam.   GI: Soft. Bowel sounds are normal. He exhibits distension. There is no tenderness.  Musculoskeletal: He exhibits edema.  2+ edema LUE and RLE. 1+ edema RUE and LLE.   Neurological: He is alert and oriented to person, place, and time.  Left central 7 and tongue deviation. LUE is 1/5 shoulder, 1+ to 2/5 elbow and trace distally. LLE is 2- to 2/5. RUE is 3 to 4/5. RLE is 2-3 /5 prox to distal. Sensation slightly dimnished left side and distal LE's. Fair insight and awareness.   Skin: Skin is warm and dry.  Multiple ecchymotic areas on BUE.   Psychiatric: He has a normal mood and affect.    Results for orders placed during the hospital encounter of 11/23/13 (from the past 24 hour(s))  GLUCOSE, CAPILLARY     Status: Abnormal   Collection Time    11/28/13  3:30 PM      Result Value Range   Glucose-Capillary 174 (*) 70 - 99 mg/dL  HEPARIN LEVEL (UNFRACTIONATED)     Status: Abnormal   Collection Time    11/28/13  4:10 PM      Result Value Range   Heparin  Unfractionated 0.20 (*) 0.30 - 0.70 IU/mL  GLUCOSE, CAPILLARY     Status: Abnormal   Collection Time    11/28/13  9:47 PM      Result Value Range   Glucose-Capillary 169 (*) 70 - 99 mg/dL  CBC     Status: Abnormal   Collection Time    11/29/13  4:10 AM      Result Value Range   WBC 6.4  4.0 - 10.5 K/uL   RBC 3.22 (*) 4.22 - 5.81 MIL/uL   Hemoglobin 10.0 (*) 13.0 - 17.0 g/dL   HCT 16.1 (*) 09.6 - 04.5 %   MCV 94.7  78.0 - 100.0 fL   MCH 31.1  26.0 - 34.0 pg   MCHC 32.8  30.0 - 36.0 g/dL   RDW 40.9  81.1 - 91.4 %   Platelets 123 (*) 150 - 400 K/uL  HEPARIN LEVEL (UNFRACTIONATED)     Status: Abnormal   Collection Time    11/29/13  4:10 AM      Result Value Range   Heparin Unfractionated 0.22 (*) 0.30 - 0.70 IU/mL  BASIC METABOLIC PANEL     Status: Abnormal   Collection Time    11/29/13  4:10 AM       Result Value Range   Sodium 137  135 - 145 mEq/L   Potassium 4.0  3.5 - 5.1 mEq/L   Chloride 101  96 - 112 mEq/L   CO2 27  19 - 32 mEq/L   Glucose, Bld 159 (*) 70 - 99 mg/dL   BUN 24 (*) 6 - 23 mg/dL   Creatinine, Ser 7.82  0.50 - 1.35 mg/dL   Calcium 8.9  8.4 - 95.6 mg/dL   GFR calc non Af Amer 51 (*) >90 mL/min   GFR calc Af Amer 59 (*) >90 mL/min  PHOSPHORUS     Status: Abnormal   Collection Time    11/29/13  4:10 AM      Result Value Range   Phosphorus 1.7 (*) 2.3 - 4.6 mg/dL  MAGNESIUM     Status: None   Collection Time    11/29/13  4:10 AM      Result Value Range   Magnesium 1.9  1.5 - 2.5 mg/dL  GLUCOSE, CAPILLARY     Status: Abnormal   Collection Time    11/29/13  8:07 AM      Result Value Range   Glucose-Capillary 168 (*) 70 - 99 mg/dL  GLUCOSE, CAPILLARY     Status: Abnormal   Collection Time    11/29/13 10:58 AM      Result Value Range   Glucose-Capillary 152 (*) 70 - 99 mg/dL   Dg Chest Port 1 View  11/29/2013   CLINICAL DATA:  Assess edema.  EXAM: PORTABLE CHEST - 1 VIEW  COMPARISON:  11/28/2013  FINDINGS: Left jugular central line is near the junction of the superior vena cava and left innominate vein. Interstitial pulmonary edema has slightly improved. There continues to be increased densities at the right lung base. Heart size is mildly enlarged. Vascular stent on the right side of the neck.  IMPRESSION: Interstitial edema has minimally improved. There continues to be increased densities at the right lung base.   Electronically Signed   By: Richarda Overlie M.D.   On: 11/29/2013 07:41   Dg Chest Port 1 View  11/28/2013   CLINICAL DATA:  Shortness of breath.  EXAM: PORTABLE CHEST - 1 VIEW  COMPARISON:  11/27/2013.  FINDINGS: Left IJ catheter in good anatomic position. Right carotid stent. Cardiomegaly with pulmonary venous congestion and prominent bilateral pulmonary interstitial and alveolar infiltrates. Small right pleural effusion may be present. These findings are  consistent with congestive heart failure with pulmonary edema. Superimposed pneumonia cannot be excluded. No pneumothorax. Degenerative changes both shoulders.  IMPRESSION: 1. Congestive heart failure with bilateral pulmonary edema. Small right pleural effusion may be present. Superimposed pneumonia cannot be excluded. 2. Left IJ catheter in stable position.  Right carotid stent.   Electronically Signed   By: Maisie Fus  Register   On: 11/28/2013 07:24   Dg Chest Port 1 View  11/27/2013   CLINICAL DATA:  Line placement  EXAM: PORTABLE CHEST - 1 VIEW  COMPARISON:  11/25/2013; 11/24/2013; 11/22/2013; 11/25/2010  FINDINGS: Grossly unchanged enlarged cardiac silhouette and mediastinal contours. Interval placement of left jugular approach central venous catheter with tip projected over the superior/ mid SVC. No pneumothorax. Bibasilar heterogeneous slightly nodular opacities are grossly unchanged, right greater than left. The pulmonary vasculature appears slightly indistinct, in particular within the right upper and mid lung. No definite pleural effusion. Grossly unchanged bones.  IMPRESSION: 1. Interval placement of left jugular approach under venous catheter with tip projected over to the superior/mid SVC. No pneumothorax. 2. Persistent findings of bibasilar opacities, right greater than left, atelectasis versus infiltrate. 3. Cardiomegaly and possible mild asymmetric pulmonary edema, right greater than left.   Electronically Signed   By: Simonne Come M.D.   On: 11/27/2013 14:03    Assessment/Plan: Diagnosis: Right lower extremity ischemia s/p angioplasty, multiple medical---deconditioned.  Also has a history of right CVA with chronic left hemiparesis 1. Does the need for close, 24 hr/day medical supervision in concert with the patient's rehab needs make it unreasonable for this patient to be served in a less intensive setting? Yes and Potentially 2. Co-Morbidities requiring supervision/potential complications: a  fib, chf 3. Due to bladder management, bowel management, safety, skin/wound care, disease management, medication administration, pain management and patient education, does the patient require 24 hr/day rehab nursing? Yes 4. Does the patient require coordinated care of a physician, rehab nurse, PT (1-2 hrs/day, 5 days/week) and OT (1-2 hrs/day, 5 days/week) to address physical and functional deficits in the context of the above medical diagnosis(es)? Yes Addressing deficits in the following areas: balance, endurance, locomotion, strength, transferring, bowel/bladder control, bathing, dressing, feeding, grooming, toileting and psychosocial support 5. Can the patient actively participate in an intensive therapy program of at least 3 hrs of therapy per day at least 5 days per week? Potentially 6. The potential for patient to make measurable gains while on inpatient rehab is good 7. Anticipated functional outcomes upon discharge from inpatient rehab are min assist with PT, min to mod assist with OT, n/a with SLP. 8. Estimated rehab length of stay to reach the above functional goals is: 15-22 days 9. Does the patient have adequate social supports to accommodate these discharge functional goals? Potentially 10. Anticipated D/C setting: Home 11. Anticipated post D/C treatments: HH therapy 12. Overall Rehab/Functional Prognosis: good  RECOMMENDATIONS: This patient's condition is appropriate for continued rehabilitative care in the following setting: CIR Patient has agreed to participate in recommended program. Yes and Potentially Note that insurance prior authorization may be required for reimbursement for recommended care.  Comment: Will follow along for medical stability.  Ranelle Oyster, MD, [facility] Physical Medicine & Rehabilitation     11/29/2013

## 2013-11-30 ENCOUNTER — Encounter (HOSPITAL_COMMUNITY): Payer: Self-pay | Admitting: Physician Assistant

## 2013-11-30 ENCOUNTER — Inpatient Hospital Stay (HOSPITAL_COMMUNITY): Payer: Medicare HMO

## 2013-11-30 LAB — CBC
HCT: 30.6 % — ABNORMAL LOW (ref 39.0–52.0)
Hemoglobin: 10 g/dL — ABNORMAL LOW (ref 13.0–17.0)
MCH: 30.9 pg (ref 26.0–34.0)
MCHC: 32.7 g/dL (ref 30.0–36.0)
MCV: 94.4 fL (ref 78.0–100.0)
RDW: 14.6 % (ref 11.5–15.5)

## 2013-11-30 LAB — BASIC METABOLIC PANEL
CO2: 28 mEq/L (ref 19–32)
Calcium: 9.1 mg/dL (ref 8.4–10.5)
Creatinine, Ser: 1.32 mg/dL (ref 0.50–1.35)
GFR calc non Af Amer: 49 mL/min — ABNORMAL LOW (ref >90)
Glucose, Bld: 135 mg/dL — ABNORMAL HIGH (ref 70–99)
Sodium: 137 mEq/L (ref 135–145)

## 2013-11-30 LAB — PHOSPHORUS: Phosphorus: 2.3 mg/dL (ref 2.3–4.6)

## 2013-11-30 LAB — GLUCOSE, CAPILLARY
Glucose-Capillary: 130 mg/dL — ABNORMAL HIGH (ref 70–99)
Glucose-Capillary: 130 mg/dL — ABNORMAL HIGH (ref 70–99)

## 2013-11-30 LAB — HEPARIN LEVEL (UNFRACTIONATED): Heparin Unfractionated: 0.46 IU/mL (ref 0.30–0.70)

## 2013-11-30 MED ORDER — AMIODARONE HCL 200 MG PO TABS
400.0000 mg | ORAL_TABLET | Freq: Two times a day (BID) | ORAL | Status: DC
  Administered 2013-11-30 – 2013-12-04 (×8): 400 mg via ORAL
  Filled 2013-11-30 (×9): qty 2

## 2013-11-30 MED ORDER — DOCUSATE SODIUM 100 MG PO CAPS
100.0000 mg | ORAL_CAPSULE | Freq: Two times a day (BID) | ORAL | Status: DC
  Administered 2013-11-30 – 2013-12-02 (×5): 100 mg via ORAL
  Filled 2013-11-30 (×8): qty 1

## 2013-11-30 MED ORDER — FUROSEMIDE 10 MG/ML IJ SOLN
40.0000 mg | Freq: Two times a day (BID) | INTRAMUSCULAR | Status: AC
  Administered 2013-11-30 – 2013-12-02 (×4): 40 mg via INTRAVENOUS
  Filled 2013-11-30 (×4): qty 4

## 2013-11-30 MED ORDER — AMIODARONE HCL IN DEXTROSE 360-4.14 MG/200ML-% IV SOLN
30.0000 mg/h | INTRAVENOUS | Status: DC
  Filled 2013-11-30 (×5): qty 200

## 2013-11-30 MED ORDER — METOPROLOL TARTRATE 50 MG PO TABS
50.0000 mg | ORAL_TABLET | Freq: Three times a day (TID) | ORAL | Status: DC
  Filled 2013-11-30 (×2): qty 1

## 2013-11-30 MED ORDER — BIOTENE DRY MOUTH MT LIQD
15.0000 mL | Freq: Two times a day (BID) | OROMUCOSAL | Status: DC
  Administered 2013-11-30 – 2013-12-05 (×10): 15 mL via OROMUCOSAL

## 2013-11-30 MED ORDER — WARFARIN SODIUM 5 MG PO TABS
5.0000 mg | ORAL_TABLET | Freq: Once | ORAL | Status: AC
  Administered 2013-11-30: 5 mg via ORAL
  Filled 2013-11-30: qty 1

## 2013-11-30 MED ORDER — METOPROLOL TARTRATE 50 MG PO TABS
50.0000 mg | ORAL_TABLET | Freq: Three times a day (TID) | ORAL | Status: DC
  Administered 2013-11-30 – 2013-12-04 (×11): 50 mg via ORAL
  Filled 2013-11-30 (×15): qty 1

## 2013-11-30 MED ORDER — BISACODYL 5 MG PO TBEC
5.0000 mg | DELAYED_RELEASE_TABLET | Freq: Two times a day (BID) | ORAL | Status: DC
  Administered 2013-11-30 – 2013-12-02 (×5): 5 mg via ORAL
  Filled 2013-11-30 (×8): qty 1

## 2013-11-30 MED ORDER — POLYETHYLENE GLYCOL 3350 17 G PO PACK
17.0000 g | PACK | Freq: Two times a day (BID) | ORAL | Status: DC
  Administered 2013-11-30 – 2013-12-02 (×5): 17 g via ORAL
  Filled 2013-11-30 (×9): qty 1

## 2013-11-30 NOTE — Progress Notes (Signed)
ANTICOAGULATION CONSULT NOTE - Follow Up Consult  Pharmacy Consult for heparin Indication: atrial fibrillation and DVT  No Known Allergies  Patient Measurements: Height: 5\' 7"  (170.2 cm) Weight: 219 lb 5.7 oz (99.5 kg) IBW/kg (Calculated) : 66.1  Vital Signs: Temp: 98 F (36.7 C) (12/18 1141) Temp src: Oral (12/18 1141) BP: 159/85 mmHg (12/18 1200) Pulse Rate: 64 (12/18 1200)  Labs:  Recent Labs  11/28/13 0400  11/29/13 0410 11/29/13 1100 11/30/13 0415  HGB 10.8*  --  10.0*  --  10.0*  HCT 32.8*  --  30.5*  --  30.6*  PLT 123*  --  123*  --  131*  LABPROT  --   --   --   --  15.6*  INR  --   --   --   --  1.27  HEPARINUNFRC  --   < > 0.22* 0.39 0.46  CREATININE 1.22  --  1.28  --  1.32  < > = values in this interval not displayed.  Estimated Creatinine Clearance: 50.2 ml/min (by C-G formula based on Cr of 1.32).   Medical History: Past Medical History  Diagnosis Date  . Hypertension   . Stroke   . COPD (chronic obstructive pulmonary disease)   . Diabetes mellitus without complication   . GERD (gastroesophageal reflux disease)   . A-fib     Medications:  Prescriptions prior to admission  Medication Sig Dispense Refill  . aspirin EC 81 MG tablet Take 81 mg by mouth at bedtime.      Marland Kitchen atorvastatin (LIPITOR) 10 MG tablet Take 10 mg by mouth daily.      . benazepril (LOTENSIN) 10 MG tablet Take 10 mg by mouth 2 (two) times daily.      . furosemide (LASIX) 40 MG tablet Take 60 mg by mouth daily.      Marland Kitchen glipiZIDE (GLUCOTROL XL) 10 MG 24 hr tablet Take 10 mg by mouth 2 (two) times daily.      . metoprolol (LOPRESSOR) 50 MG tablet Take 50 mg by mouth 2 (two) times daily.      . potassium chloride SA (K-DUR,KLOR-CON) 20 MEQ tablet Take 20 mEq by mouth daily.      . ranitidine (ZANTAC) 150 MG tablet Take 150 mg by mouth 2 (two) times daily.      . sitaGLIPtin-metformin (JANUMET) 50-500 MG per tablet Take 1 tablet by mouth 2 (two) times daily with a meal.      .  terazosin (HYTRIN) 5 MG capsule Take 5 mg by mouth every other day.        Assessment: Pt s/p embolectomy for R femoral DVT also has h/o AFib, CHADS2 = 4. H/o stroke 1997. Pt had hematuria on 12/14-12/15, now resolved. CBC today is stable. Pt is being anticoagulated with heaprin drip 2300 uts/hr HL 0.46 at goal.  Warfarin started 12/17 INR 1.26, CBC stable, no bleeding noted  Goal of Therapy:  HL 0.3-0.7 INR 2-3 Monitor platelets by anticoagulation protocol: Yes   Plan:  Continue heparin 2300 units/hour Warfarin 5mg  x1 tonight Daily CBC, HL, Protime  Leota Sauers Pharm.D. CPP, BCPS Clinical Pharmacist [phone] 11/30/2013 2:24 PM

## 2013-11-30 NOTE — Clinical Social Work Psychosocial (Signed)
Clinical Social Work Department BRIEF PSYCHOSOCIAL ASSESSMENT 11/30/2013  Patient:  [name] [name],[name] [name]     Account Number:  [id number]     Admit date:  11/23/2013  Clinical Social Worker:  Read Drivers  Date/Time:  11/30/2013 02:16 PM  Referred by:  Care Management  Date Referred:  11/30/2013 Referred for  SNF Placement   Other Referral:   none   Interview type:  Patient Other interview type:   none    PSYCHOSOCIAL DATA Living Status:  SIGNIFICANT OTHER Admitted from facility:   Level of care:   Primary support name:  Roddie Mc Primary support relationship to patient:  SPOUSE Degree of support available:   strong    CURRENT CONCERNS Current Concerns  Post-Acute Placement   Other Concerns:   none    SOCIAL WORK ASSESSMENT / PLAN CSW assessed pt at bedside.  Pt was alert, oriented, pleasant and demonstrated a great sense of humor and positive attitude.  Pt stated that he is from home with wife prior to hospital admission. Pt understands that he has orders to transfer to a stepdown unit.  Pt is aware that [employer] may not cover CIR and is agreeable to SNF search.  Pt states that his first choice is [facility] in [place] which is 2 miles away from his home. CSW confirmed pt agreeable to be faxed to [facility].    Pt stated that he and his wife have been married for 57 years and that his wife still drives and is in good health. Pt reports that his wife is scheduled for eye surgery for glaucoma later today.  CSW provided emotional support.   Assessment/plan status:  Psychosocial Support/Ongoing Assessment of Needs Other assessment/ plan:   none   Information/referral to community resources:   SNF  CIR    PATIENT'S/FAMILY'S RESPONSE TO PLAN OF CARE: Pt was very appreciative of CSW assistance and support.       Vickii Penna, LCSWA [phone]  Clinical Social Work

## 2013-11-30 NOTE — Progress Notes (Signed)
Patient transferred to 2C17 by this RN and nurse tech. RN on 2C given updates. Patient placed on telemetry monitor, all vital signs WDL. Patient in no acute distress. Family in waiting area.   Wyn Quaker RN

## 2013-11-30 NOTE — Progress Notes (Signed)
CSW called [employer] rep Alcario Drought and left her a voicemail stating CSW faxed clinicals over for pt. In confidential voicemail, CSW provided pt's name, [employer] subscriber #, and DOB asking that [facility] call CSW back if they need additional information from CSW.   Maryclare Labrador, MSW, [facility] Clinical Social Worker [phone]

## 2013-11-30 NOTE — Progress Notes (Addendum)
[facility]  Subjective  - Doing well, just ate a little breakfast.   Objective 147/90 141 98.7 F (37.1 C) (Oral) 23 97%  Intake/Output Summary (Last 24 hours) at 11/30/13 0941 Last data filed at 11/30/13 0900  Gross per 24 hour  Intake 1192.8 ml  Output   1235 ml  Net  -42.2 ml    Right groin soft, incision clean and dry.  Dry 4 x 4 placed in the groin. Distally feet warm and well perfused.   Assessment/Planning: POD #7 s/p: Right femoral thrombectomy  Active Problems A fib Acute respiratory failure with hypoxia Hypotension       [name] [facility] 11/30/2013 9:41 AM --  Laboratory Lab Results:  Recent Labs  11/29/13 0410 11/30/13 0415  WBC 6.4 6.4  HGB 10.0* 10.0*  HCT 30.5* 30.6*  PLT 123* 131*   BMET  Recent Labs  11/29/13 0410 11/30/13 0415  NA 137 137  K 4.0 4.0  CL 101 101  CO2 27 28  GLUCOSE 159* 135*  BUN 24* 29*  CREATININE 1.28 1.32  CALCIUM 8.9 9.1    COAG Lab Results  Component Value Date   INR 1.27 11/30/2013   No results found for this basename: PTT      I have examined the patient, reviewed and agree with above.  [name], MD 12/01/2013 12:49 PM

## 2013-11-30 NOTE — Progress Notes (Signed)
I met with pt, wife, and son in law at bedside. Discussed that [facility] will not approve inpt rehab admission for this pt. rec SNF. They prefer [facility] [place] or [facility] [facility]. [phone]

## 2013-11-30 NOTE — Progress Notes (Addendum)
Clinical Social Work Department CLINICAL SOCIAL WORK PLACEMENT NOTE 11/30/2013  Patient:  [name] [name],[name] [name]  Account Number:  [id number] Admit date:  11/23/2013  Clinical Social Worker:  Maryclare Labrador, Theresia Majors  Date/time:  11/30/2013 03:23 PM  Clinical Social Work is seeking post-discharge placement for this patient at the following level of care:   SKILLED NURSING   (*CSW will update this form in Epic as items are completed)   N/A-pt informed of bed options by previous CSW, and pt requested transmittal to [facility] only  Patient/family provided with [facility] Department of Clinical Social Work's list of facilities offering this level of care within the geographic area requested by the patient (or if unable, by the patient's family).  11/30/2013  Patient/family informed of their freedom to choose among providers that offer the needed level of care, that participate in Medicare, Medicaid or managed care program needed by the patient, have an available bed and are willing to accept the patient.  N/A-clincials not sent to this facility  Patient/family informed of [facility]' ownership interest in [facility], as well as of the fact that they are under no obligation to receive care at this facility.  PASARR submitted to EDS on EXISTING PASARR number received from EDS on EXISTING  FL2 transmitted to all facilities in geographic area requested by pt/family on  11/30/2013 FL2 transmitted to all facilities within larger geographic area on   Patient informed that his/her managed care company has contracts with or will negotiate with  certain facilities, including the following:     Patient/family informed of bed offers received:  12/01/2013 ([facility] and [facility]) Patient chooses bed at [facility] Physician recommends and patient chooses bed at    Patient to be transferred to [facility] (SEND [facility] UPDATED PT NOTES ON [facility]INDERPRO WHEN PT IS READY  FOR DISCHARGE) on   Patient to be transferred to facility by   The following physician request were entered in Epic:   Additional Comments:   Maryclare Labrador, MSW, [facility] Clinical Social Worker [phone]

## 2013-11-30 NOTE — Progress Notes (Signed)
CSW has faxed clinicals to [facility] for insurance authorization for SNF.   Maryclare Labrador, MSW, [facility] Clinical Social Worker [phone]

## 2013-11-30 NOTE — Progress Notes (Signed)
Physical Therapy Treatment Patient Details Name: [name] [name] MRN: [id number] DOB: 03-18-1931 Today's Date: 11/30/2013 Time: 4098-1191 PT Time Calculation (min): 25 min  PT Assessment / Plan / Recommendation  History of Present Illness Pt transferred from [facility] for R LE embolectomy. Remained on vent 12/11-12/12; post-op hypotension requiring pressors.   PT Comments   Pt requiring a little more assist today.  Pt remains very motivated.  Follow Up Recommendations  CIR     Does the patient have the potential to tolerate intense rehabilitation     Barriers to Discharge        Equipment Recommendations  None recommended by PT    Recommendations for Other Services    Frequency Min 3X/week   Progress towards PT Goals Progress towards PT goals: Progressing toward goals  Plan Current plan remains appropriate    Precautions / Restrictions Precautions Precautions: Fall Required Braces or Orthoses: Other Brace/Splint Other Brace/Splint: pt normally uses Lt double-upright [name] (currently at home; too much edema to use)   Pertinent Vitals/Pain SaO2 > 95% with O2. HR 100's    Mobility  Bed Mobility Supine to Sit: 1: +2 Total assist;HOB elevated Supine to Sit: Patient Percentage: 30% Sitting - Scoot to Edge of Bed: 1: +2 Total assist Sitting - Scoot to Edge of Bed: Patient Percentage: 20% Sit to Supine: 1: +2 Total assist;HOB flat Sit to Supine: Patient Percentage: 20% Details for Bed Mobility Assistance: pt required assist for both LE management and trunk elevation.  Transfers Transfers: Sit to Stand;Stand to Sit Sit to Stand: 1: +2 Total assist;From bed Sit to Stand: Patient Percentage: 40% Stand to Sit: 1: +2 Total assist Stand to Sit: Patient Percentage: 40% Details for Transfer Assistance: Verbal/tactile cues for foot placement and to bring trunk anteriorly.  Allowed pt to place hands on walker to encourage forward lean. Pt unable to achieve full hip, knee, and  trunk extension despite 2 person hands on assist.  Pt's left arm providing little support on walker. Stood 3 times for 10-30 seconds each.    Exercises     PT Diagnosis:    PT Problem List:   PT Treatment Interventions:     PT Goals (current goals can now be found in the care plan section)    Visit Information  Last PT Received On: 11/30/13 Assistance Needed: +2 History of Present Illness: Pt transferred from [facility] for R LE embolectomy. Remained on vent 12/11-12/12; post-op hypotension requiring pressors.    Subjective Data      Cognition  Cognition Arousal/Alertness: Awake/alert Behavior During Therapy: WFL for tasks assessed/performed Overall Cognitive Status: Within Functional Limits for tasks assessed Memory: Decreased short-term memory    Balance  Static Sitting Balance Static Sitting - Balance Support: No upper extremity supported;Feet supported Static Sitting - Level of Assistance: 4: Min assist;3: Mod assist Static Sitting - Comment/# of Minutes: Sat EOB x 15 minutes.  Pt with posterior and lt lean. Static Standing Balance Static Standing - Balance Support: Bilateral upper extremity supported Static Standing - Level of Assistance: 1: +2 Total assist  End of Session PT - End of Session Equipment Utilized During Treatment: Oxygen Activity Tolerance: Patient limited by fatigue Patient left: in bed;with call bell/phone within reach;with bed alarm set;with family/visitor present Nurse Communication: Mobility status;Need for lift equipment   GP     Jaggar Benko 11/30/2013, 3:10 PM  [facility] PT [phone]

## 2013-11-30 NOTE — Progress Notes (Signed)
First choice SNF, [facility] [place], has not responded to CSW request for bed. Pt does have bed offer from [facility], and CSW will present this option to pt tomorrow.   Maryclare Labrador, MSW, [facility] Clinical Social Worker [phone]

## 2013-11-30 NOTE — Progress Notes (Addendum)
PULMONARY  / CRITICAL CARE MEDICINE  Name: [name] [name] MRN: [id number] DOB: 04-11-1931    ADMISSION DATE:  11/23/2013 CONSULTATION DATE:  11/24/13  REFERRING MD :  Waverly Ferrari, MD PRIMARY SERVICE: Vascular surgery  CHIEF COMPLAINT:  Post right femoral embolectomy. Intubated.   BRIEF PATIENT DESCRIPTION:  79 year old male with PMH relevant for HTN, DM, A. Fib not on anticoagulation, PVD, COPD. Presents with ischemic right lower extremity. Now post right femoral embolectomy. Out of the OR intubated. Consult called for vent management. Post op course c/b AFRVR, hypotension  SIGNIFICANT EVENTS / STUDIES:  12/12 Right femoral embolectomy. Bovine pericardial patch angioplasty of the right femoral artery 12/12 2D echo>>>EF 60-65%, calcified aortic valve 12/13 amio 12/16-remains on neo 12/17- off neo, remains on untitrated amio  LINES / TUBES: ETT 12/11 >> 12/12 12/15 ij>>>  CULTURES: none  ANTIBIOTICS: Cefuroxime (peri-op) 12/11 >> 12/12   SUBJECTIVE:  Remains off pressors  VITAL SIGNS: Temp:  [97.2 F (36.2 C)-98.7 F (37.1 C)] 98.7 F (37.1 C) (12/18 0823) Pulse Rate:  [55-147] 141 (12/18 0900) Resp:  [17-35] 23 (12/18 0900) BP: (90-147)/(45-90) 147/90 mmHg (12/18 0900) SpO2:  [89 %-98 %] 97 % (12/18 0900) Weight:  [99.5 kg (219 lb 5.7 oz)] 99.5 kg (219 lb 5.7 oz) (12/18 0600) HEMODYNAMICS: CVP:  [12 mmHg-13 mmHg] 13 mmHg VENTILATOR SETTINGS:   INTAKE / OUTPUT: Intake/Output     12/17 0701 - 12/18 0700 12/18 0701 - 12/19 0700   P.O. 240    I.V. (mL/kg) 952.8 (9.6) 79.4 (0.8)   Total Intake(mL/kg) 1192.8 (12) 79.4 (0.8)   Urine (mL/kg/hr) 1295 (0.5) 100 (0.2)   Total Output 1295 100   Net -102.2 -20.6        Emesis Occurrence  1 x     PHYSICAL EXAMINATION: General: no distress Neuro: awake, alert, appropriate, MAE CV:  s1s2 irreg PULM: cta GI: abd slight distention, soft, wnl bs  Extremities: edema   LABS:  Recent Labs Lab 11/26/13 0415  11/27/13 0414 11/28/13 0400 11/29/13 0410 11/30/13 0415  NA 134* 135 136 137 137  K 3.9 3.7 4.1 4.0 4.0  CL 99 101 102 101 101  CO2 22 23 26 27 28   GLUCOSE 138* 132* 160* 159* 135*  BUN 37* 30* 26* 24* 29*  CREATININE 1.72* 1.42* 1.22 1.28 1.32  CALCIUM 8.4 8.6 8.9 8.9 9.1  MG  --   --   --  1.9 1.9  PHOS  --   --   --  1.7* 2.3     CXR:   No new CXR 12/14  ASSESSMENT / PLAN:  PULMONARY A: VDRF - resolved  Smoking history - suspected COPD Edema with pos balance 12/16 Pulmonary edema -Acute Diastolic Heart Failure 2/2 A-fib with RVR  P:   Improved with lasix, maintain as able IS Ambulate, active PT  CARDIOVASCULAR A:  AFib RVR Hypotension Chronic beta blocker use P:  home metoprolol, increase Continue amiodarone drip load, will consult cards, strategy with amio? Lasix low dose Keep line as on amio still tele  RENAL A:   Elevated Cr. Baseline unknown. Suspect CKD, edema Acue renal failure P:  Acute Renal Failure 2/2 Acute Tubular Necrosis  Monitor BMET in am  Lasix to q12h kvo  GASTROINTESTINAL A:   Vomit x 1 this am  Had a BM 12/17 ileuslikely P:   SUP: IV PPI tol PO diet overall, am vomit however Myrlax, maximize this dulx supp repeat Get kub  HEMATOLOGIC  A:   Acute RLE thrombus/embolus - s/p embolectomy afib P:  Coumadin heparin bridge NO novel with renals even though resolved  INFECTIOUS A:   No evidence of acute infection P:   Follow fever curve  ENDOCRINE A:   DM 2, controlled P:   Cont SSI  NEUROLOGIC A:   Post op pain P:   PRN fentanyl ambulation  To sdu Cards called, triad to pick up, lasix maintain  Mcarthur Rossetti. Tyson Alias, MD, FACP Pgr: [phone] [facility]

## 2013-11-30 NOTE — Consult Note (Signed)
CARDIOLOGY CONSULT NOTE   Patient ID: [name] [name] MRN: [id number] DOB/AGE: 77-Jan-1932 77 y.o.  Admit date: 11/23/2013  Primary Physician Internal medicine doctor in [facility] Cardiologist   never had a cardiologist in [place] Reason for Consultation   atrial fibrillation, RVR  [name] [name] is a 79 y.o. male with no history of CAD, PVD. He has a history of atrial fibrillation but had not been anticoagulated. He also has a history of hypertension, diabetes and tobacco use.  He has been followed by his primary care physician for these issues. He has been on aspirin for anti-coagulation, possibly due to increased fall risk. He has slid out of bed a couple of times, fallen in the barber shop and fallen off the toilet. Prior to admission, he was able to ambulate with a walker and sometimes using a cane. His ambulation had deteriorated however, with right foot/lower leg problems.  Prior to admission, he was never aware of his atrial fibrillation. He denies palpitations. He has chronic dyspnea on exertion and occasional mild lower extremity edema, but his edema has been much worse in the hospital. His weight was 89.4 kg on admission, currently 99.5 kg. his wife states that she checks his blood pressure and heart rate at all sometimes and it is difficult for the machine to get it because it is so irregular, but she doesn't take it runs as fast as it has been since being in the hospital.  He had sudden onset of weakness and discoloration of his right lower extremity on the day before admission. He was admitted by Dr. Edilia Bo and had emergency surgery. He had a Right femoral embolectomy and Bovine pericardial patch angioplasty of the right femoral artery for an acute ischemic lower extremity.  He was in rapid atrial fibrillation on admission. He was started on a Cardizem drip for rate control. However, his blood pressure was borderline and the drip was discontinued.   Critical care  medicine was consulted for management of his respiratory status, atrial fibrillation and other medical problems. He was started on heparin for anticoagulation.   He was extubated on 12/12. He remained hypotensive, requiring neo for blood pressure support. Amiodarone was added on 12/13 to help with rate control as he was still on pressors. He was started on IV metoprolol to help with rate control and then transitioned to oral metoprolol at 25 mg twice a day with parameters. However, doses were sometimes held because of hypotension and he was still on the Neo-Synephrine. The metoprolol was discontinued on 12/15 but restarted on 12/17 because his rate control remained poor. He got 2 doses on 12/17 but it was discontinued on 12/18.   He was finally weaned off the Neo-Synephrine 12/17. He is still on the amiodarone at 30 mg per hour. This has been continuous since 12/13. His current amiodarone load is greater than 6 g. His heart rate is generally remaining greater than 100. His blood pressure has ranged from 90/62 up to 159/85 in the last 36 hours. His heart rate has ranged from 89 up to 147.   An echocardiogram was performed, results below. His EF is preserved.   Past Medical History  Diagnosis Date  . Hypertension   . Stroke   . COPD (chronic obstructive pulmonary disease)   . Diabetes mellitus without complication   . GERD (gastroesophageal reflux disease)   . A-fib      Past Surgical History  Procedure Laterality Date  . Carotid stent    .  Parotid gland tumor excision    . Embolectomy Right 11/23/2013    Procedure: Right Femoral EMBOLECTOMY;  Surgeon: Chuck Hint, MD;  Location: [facility] OR;  Service: Vascular;  Laterality: Right;  Right Femoral Embolectomy with Bovine Paricardium patch angioplasty.    No Known Allergies  I have reviewed the patient's current medications . antiseptic oral rinse  15 mL Mouth Rinse QID  . antiseptic oral rinse  15 mL Mouth Rinse BID  . bisacodyl  5 mg  Oral BID  . docusate sodium  100 mg Oral BID  . furosemide  40 mg Intravenous BID  . insulin aspart  0-15 Units Subcutaneous TID WC  . ipratropium  0.5 mg Nebulization Q6H WA  . levalbuterol  0.63 mg Nebulization Q6H WA  . metoprolol tartrate  50 mg Oral Q8H  . pantoprazole  40 mg Oral Daily  . polyethylene glycol  17 g Oral BID  . warfarin  5 mg Oral ONCE-1800  . warfarin   Does not apply Once  . Warfarin - Pharmacist Dosing Inpatient   Does not apply q1800   . amiodarone (NEXTERONE PREMIX) 360 mg/200 mL dextrose 30 mg/hr (11/30/13 1407)  . heparin 2,300 Units/hr (11/30/13 0800)   acetaminophen, acetaminophen, alum & mag hydroxide-simeth, fentaNYL, labetalol, lactulose, levalbuterol, ondansetron, phenol  Prior to Admission medications   Medication Sig Start Date End Date Taking? Authorizing Provider  aspirin EC 81 MG tablet Take 81 mg by mouth at bedtime.   Yes Historical Provider, MD  atorvastatin (LIPITOR) 10 MG tablet Take 10 mg by mouth daily.   Yes Historical Provider, MD  benazepril (LOTENSIN) 10 MG tablet Take 10 mg by mouth 2 (two) times daily.   Yes Historical Provider, MD  furosemide (LASIX) 40 MG tablet Take 60 mg by mouth daily.   Yes Historical Provider, MD  glipiZIDE (GLUCOTROL XL) 10 MG 24 hr tablet Take 10 mg by mouth 2 (two) times daily.   Yes Historical Provider, MD  metoprolol (LOPRESSOR) 50 MG tablet Take 50 mg by mouth 2 (two) times daily.   Yes Historical Provider, MD  potassium chloride SA (K-DUR,KLOR-CON) 20 MEQ tablet Take 20 mEq by mouth daily.   Yes Historical Provider, MD  ranitidine (ZANTAC) 150 MG tablet Take 150 mg by mouth 2 (two) times daily.   Yes Historical Provider, MD  sitaGLIPtin-metformin (JANUMET) 50-500 MG per tablet Take 1 tablet by mouth 2 (two) times daily with a meal.   Yes Historical Provider, MD  terazosin (HYTRIN) 5 MG capsule Take 5 mg by mouth every other day.   Yes Historical Provider, MD     History   Social History  . Marital  Status: Married    Spouse Name: N/A    Number of Children: N/A  . Years of Education: N/A   Occupational History  . Not on file.   Social History Main Topics  . Smoking status: Former Games developer  . Smokeless tobacco: Not on file  . Alcohol Use: No  . Drug Use: No  . Sexual Activity: Not on file   Other Topics Concern  . Not on file   Social History Narrative  . No narrative on file    Family Status  Relation Status Death Age  . Mother Deceased   . Father Deceased    No family history on file.   ROS:  Full 14 point review of systems complete and found to be negative unless listed above.  Physical Exam: Blood pressure 159/85, pulse 64,  temperature 98 F (36.7 C), temperature source Oral, resp. rate 20, height 5\' 7"  (1.702 m), weight 219 lb 5.7 oz (99.5 kg), SpO2 98.00%.  General: Well developed, well nourished, male in no acute distress at rest, significant dyspnea with minimal exertion Head: Eyes PERRLA, No xanthomas.   Normocephalic and atraumatic, oropharynx without edema or exudate. Dentition: Edentulous Lungs: Upper airway wheeze, rales bilaterally with lower airway expiratory wheeze as well Heart: Heart irregular rate and rhythm with S1, S2, 2/6  murmur. pulses are 2+ both upper extrem. A little difficult to palpate in both lower extremity is to do edema  Neck: Right carotid bruit, left difficult to assess secondary to IV lines. No lymphadenopathy.  JVD difficult to assess due to equipment, appears elevated. Abdomen: Bowel sounds present, abdomen soft and non-tender without masses or hernias noted. Msk:  No spine or cva tenderness. No weakness, no joint deformities or effusions. Extremities: No clubbing or cyanosis. 1-2+ edema, right greater than left.  Neuro: Alert and oriented X 3. Chronic left-sided focal deficits noted. Psych:  Good affect, responds appropriately Skin: No rashes or lesions noted.  Labs:   Lab Results  Component Value Date   WBC 6.4 11/30/2013    HGB 10.0* 11/30/2013   HCT 30.6* 11/30/2013   MCV 94.4 11/30/2013   PLT 131* 11/30/2013    Recent Labs  11/30/13 0415  INR 1.27    Recent Labs Lab 11/28/13 0400  11/30/13 0415  NA 136  < > 137  K 4.1  < > 4.0  CL 102  < > 101  CO2 26  < > 28  BUN 26*  < > 29*  CREATININE 1.22  < > 1.32  CALCIUM 8.9  < > 9.1  PROT 6.1  --   --   BILITOT 1.2  --   --   ALKPHOS 74  --   --   ALT 25  --   --   AST 46*  --   --   GLUCOSE 160*  < > 135*  ALBUMIN 2.8*  --   --   < > = values in this interval not displayed. Magnesium  Date Value Range Status  11/30/2013 1.9  1.5 - 2.5 mg/dL Final   TSH  Date/Time Value Range Status  11/27/2013  1:00 PM 0.725  0.350 - 4.500 uIU/mL Final     Performed at [facility]   Echocardiogram: 11/24/2013 Conclusions - Left ventricle: Systolic function was normal. The estimated ejection fraction was in the range of 60% to 65%.  - Aortic valve: no aortic valve assessment was done. Moderately thickened, moderately calcified leaflets. - Mitral valve: Mildly to moderately calcified annulus. - Pericardium, extracardiac: A trivial pericardial effusion was identified.  ECG: 11/25/2013 Atrial fibrillation, rapid ventricular response  Vent. rate 103 BPM PR interval * ms QRS duration 76 ms QT/QTc 342/448 ms P-R-T axes * 94 211  Radiology:  Dg Chest Port 1 View 11/29/2013   CLINICAL DATA:  Assess edema.  EXAM: PORTABLE CHEST - 1 VIEW  COMPARISON:  11/28/2013  FINDINGS: Left jugular central line is near the junction of the superior vena cava and left innominate vein. Interstitial pulmonary edema has slightly improved. There continues to be increased densities at the right lung base. Heart size is mildly enlarged. Vascular stent on the right side of the neck.  IMPRESSION: Interstitial edema has minimally improved. There continues to be increased densities at the right lung base.   Electronically Signed   By:  Richarda Overlie M.D.   On: 11/29/2013 07:41    Dg Abd Portable 1v 11/30/2013   CLINICAL DATA:  Abdominal distention.  Diabetes.  EXAM: PORTABLE ABDOMEN - 1 VIEW  COMPARISON:  Lumbar spine series 10 19 2009.  FINDINGS: Moderate gastric distention noted. Paucity of gas is noted distally. Gastric outlet obstruction cannot be excluded. Calcific density noted over the left kidney suggesting left nephrolithiasis. Degenerative changes lumbar spine and both hips.  IMPRESSION: Cannot exclude gastric outlet obstruction. These results will be called to the ordering clinician or representative by the Radiologist Assistant, and communication documented in the PACS Dashboard.   Electronically Signed   By: Maisie Fus  Register   On: 11/30/2013 12:11    ASSESSMENT AND PLAN:    Atrial fibrillation, rapid ventricular response - blood pressure was limiting factor upon admission, has now improved. M.D. advised on adding low-dose Cardizem 30 mg every 6 hours and following response. According to his wife's description, the atrial fibrillation is likely permanent, with rate control recommended. M.D. advise on changing the amiodarone to oral formulation and adding Cardizem. He is taking some pills.  Anticoagulation: He is currently on heparin. He was only on aspirin prior to admission although he took Plavix for 30 days in the remote past, after getting a stent , probably in his carotid. He may be a fall risk, M.D. advise on changing the heparin to Lovenox and considering transition to Coumadin.  Active Problems:   Ischemic leg - per VVS and IM    Acute respiratory failure with hypoxia - he appears to have volume overload with his weight up 10 kg since admission. He has been started on Lasix 40 mg IV twice a day today. Agree with this, following his renal function closely but will leave to CCM/IM    Hypotension - improved, NEO discontinued 12/16   Signed: Theodore Demark, PA-C 11/30/2013 2:46 PM Beeper [phone]  Co-Sign MD  Patient seen, examined. Available data  reviewed. Agree with findings, assessment, and plan as outlined by [name] [name]. The patient was independently examined. On my exam, he is awake and alert but not fully oriented. He initially was not able to tell me where he lived. HEENT is normal. Jugular venous pressure is modestly elevated. His lungs are clear to auscultation. Heart is irregularly irregular with a grade 2/6 harsh systolic murmur best heard at the left lower sternal border. The abdomen is soft and obese without tenderness. His right lower extremity has 2+ edema and left lower extremity has trace edema.  I have reviewed his telemetry, lab data, and radiographic data. Hospital records were also reviewed extensively. The patient presented with acute right leg ischemia. He likely had an embolic event related to atrial fibrillation. It sounds like the patient has had long-standing atrial fibrillation but has not been anticoagulated because of fall risk. He is now on IV heparin and oral warfarin. Long-term anticoagulation will clearly be necessary. Complicating matters is the fact that he has had episodic hypotension and heart rate control has been difficult. He remains on IV amiodarone and oral metoprolol. I would avoid adding a third agent as I don't think he will tolerate the Cardizem from a blood pressure standpoint. Will transition to oral amiodarone and continue metoprolol at the current dose. His heart rate is adequately controlled at the present time. I agree with IV diuresis for treatment of acute diastolic heart failure as he does appear volume overloaded. Will follow with you. Thanks.  Tonny Bollman, M.D. 11/30/2013 6:19  PM   

## 2013-12-01 ENCOUNTER — Inpatient Hospital Stay (HOSPITAL_COMMUNITY): Payer: Medicare HMO

## 2013-12-01 DIAGNOSIS — E118 Type 2 diabetes mellitus with unspecified complications: Secondary | ICD-10-CM | POA: Diagnosis present

## 2013-12-01 DIAGNOSIS — Z8673 Personal history of transient ischemic attack (TIA), and cerebral infarction without residual deficits: Secondary | ICD-10-CM

## 2013-12-01 DIAGNOSIS — J810 Acute pulmonary edema: Secondary | ICD-10-CM

## 2013-12-01 DIAGNOSIS — J449 Chronic obstructive pulmonary disease, unspecified: Secondary | ICD-10-CM | POA: Diagnosis present

## 2013-12-01 LAB — BASIC METABOLIC PANEL
CO2: 29 mEq/L (ref 19–32)
GFR calc non Af Amer: 52 mL/min — ABNORMAL LOW (ref >90)
Glucose, Bld: 110 mg/dL — ABNORMAL HIGH (ref 70–99)
Potassium: 4 mEq/L (ref 3.5–5.1)
Sodium: 137 mEq/L (ref 135–145)

## 2013-12-01 LAB — CBC
HCT: 31.2 % — ABNORMAL LOW (ref 39.0–52.0)
Hemoglobin: 10.1 g/dL — ABNORMAL LOW (ref 13.0–17.0)
MCH: 30.9 pg (ref 26.0–34.0)
MCHC: 32.4 g/dL (ref 30.0–36.0)
MCV: 95.4 fL (ref 78.0–100.0)
RBC: 3.27 MIL/uL — ABNORMAL LOW (ref 4.22–5.81)
WBC: 6.8 10*3/uL (ref 4.0–10.5)

## 2013-12-01 LAB — GLUCOSE, CAPILLARY
Glucose-Capillary: 113 mg/dL — ABNORMAL HIGH (ref 70–99)
Glucose-Capillary: 184 mg/dL — ABNORMAL HIGH (ref 70–99)

## 2013-12-01 LAB — HEPARIN LEVEL (UNFRACTIONATED): Heparin Unfractionated: 0.35 IU/mL (ref 0.30–0.70)

## 2013-12-01 LAB — PROTIME-INR: INR: 1.4 (ref 0.00–1.49)

## 2013-12-01 LAB — MAGNESIUM: Magnesium: 1.9 mg/dL (ref 1.5–2.5)

## 2013-12-01 MED ORDER — WARFARIN SODIUM 7.5 MG PO TABS
7.5000 mg | ORAL_TABLET | Freq: Once | ORAL | Status: DC
  Filled 2013-12-01: qty 1

## 2013-12-01 MED ORDER — WARFARIN SODIUM 5 MG PO TABS
5.0000 mg | ORAL_TABLET | Freq: Once | ORAL | Status: AC
  Administered 2013-12-01: 5 mg via ORAL
  Filled 2013-12-01: qty 1

## 2013-12-01 NOTE — Progress Notes (Signed)
CSW called [employer] rep and left her another voicemail asking if she got pt's clinicals and for insurance auth, stating pt likely discharging Monday.   Maryclare Labrador, MSW, [facility] Clinical Social Worker [phone]

## 2013-12-01 NOTE — Progress Notes (Addendum)
ANTICOAGULATION CONSULT NOTE - Follow Up Consult  Pharmacy Consult for heparin Indication: atrial fibrillation and DVT  No Known Allergies  Patient Measurements: Height: 5\' 7"  (170.2 cm) Weight: 218 lb 0.6 oz (98.9 kg) IBW/kg (Calculated) : 66.1  Vital Signs: Temp: 97.4 F (36.3 C) (12/19 0700) Temp src: Oral (12/19 0700) BP: 114/74 mmHg (12/19 0700) Pulse Rate: 112 (12/19 0700)  Labs:  Recent Labs  11/29/13 0410 11/29/13 1100 11/30/13 0415 12/01/13 0445  HGB 10.0*  --  10.0* 10.1*  HCT 30.5*  --  30.6* 31.2*  PLT 123*  --  131* 145*  LABPROT  --   --  15.6* 16.8*  INR  --   --  1.27 1.40  HEPARINUNFRC 0.22* 0.39 0.46 0.35  CREATININE 1.28  --  1.32 1.26    Estimated Creatinine Clearance: 52.4 ml/min (by C-G formula based on Cr of 1.26).   Medical History: Past Medical History  Diagnosis Date  . Hypertension   . Stroke   . COPD (chronic obstructive pulmonary disease)   . Diabetes mellitus without complication   . GERD (gastroesophageal reflux disease)   . A-fib     Medications:  Prescriptions prior to admission  Medication Sig Dispense Refill  . aspirin EC 81 MG tablet Take 81 mg by mouth at bedtime.      Marland Kitchen atorvastatin (LIPITOR) 10 MG tablet Take 10 mg by mouth daily.      . benazepril (LOTENSIN) 10 MG tablet Take 10 mg by mouth 2 (two) times daily.      . furosemide (LASIX) 40 MG tablet Take 60 mg by mouth daily.      Marland Kitchen glipiZIDE (GLUCOTROL XL) 10 MG 24 hr tablet Take 10 mg by mouth 2 (two) times daily.      . metoprolol (LOPRESSOR) 50 MG tablet Take 50 mg by mouth 2 (two) times daily.      . potassium chloride SA (K-DUR,KLOR-CON) 20 MEQ tablet Take 20 mEq by mouth daily.      . ranitidine (ZANTAC) 150 MG tablet Take 150 mg by mouth 2 (two) times daily.      . sitaGLIPtin-metformin (JANUMET) 50-500 MG per tablet Take 1 tablet by mouth 2 (two) times daily with a meal.      . terazosin (HYTRIN) 5 MG capsule Take 5 mg by mouth every other day.         Assessment: Pt s/p embolectomy for R femoral DVT also has h/o AFib, CHADS2 = 4. H/o stroke 1997. Hgb stable, plt trending up. Pt is being anticoagulated with heaprin drip 2300 uts/hr HL 0.35 at goal. Pt was given amio gtt last night and switched to PO amio. INR continues to remain subtherapeutic, will increase dose slightly. CBC stable, no bleeding noted  Goal of Therapy:  HL 0.3-0.7 INR 2-3 Monitor platelets by anticoagulation protocol: Yes   Plan:  Continue heparin 2300 units/hour Warfarin 5 mg x1 tonight Daily CBC, HL, PT/INR  Ahmaad Neidhardt M. Allena Katz, PharmD Clinical Pharmacist- Resident Pager: [phone] Pharmacy: [phone] 12/01/2013 9:08 AM

## 2013-12-01 NOTE — Progress Notes (Signed)
Patient has no care team.   HPI  [name] [name] is a 79 y.o. male Admitted with embolism to his leg in the context of known atrial fibrillation  And Afib with RVR and relatively low BP, on amio and BB, hep and coumadin  EF normal known CAD  Leg feels better  No chest pain  CHADSVAsc  + 6  Hx of falls  INR 1.4  Past Medical History  Diagnosis Date  . Hypertension   . Stroke   . COPD (chronic obstructive pulmonary disease)   . Diabetes mellitus without complication   . GERD (gastroesophageal reflux disease)   . A-fib     Past Surgical History  Procedure Laterality Date  . Carotid stent    . Parotid gland tumor excision    . Embolectomy Right 11/23/2013    Procedure: Right Femoral EMBOLECTOMY;  Surgeon: Chuck Hint, MD;  Location: [facility] OR;  Service: Vascular;  Laterality: Right;  Right Femoral Embolectomy with Bovine Paricardium patch angioplasty.    Current Facility-Administered Medications  Medication Dose Route Frequency Provider Last Rate Last Dose  . acetaminophen (TYLENOL) tablet 325-650 mg  325-650 mg Oral Q4H PRN Chuck Hint, MD       Or  . acetaminophen (TYLENOL) suppository 325-650 mg  325-650 mg Rectal Q4H PRN Chuck Hint, MD      . alum & mag hydroxide-simeth (MAALOX/MYLANTA) 200-200-20 MG/5ML suspension 15-30 mL  15-30 mL Oral Q2H PRN Chuck Hint, MD      . amiodarone (NEXTERONE PREMIX) 360 mg/200 mL dextrose IV infusion  30 mg/hr Intravenous Continuous Tonny Bollman, MD      . amiodarone (PACERONE) tablet 400 mg  400 mg Oral BID Tonny Bollman, MD   400 mg at 11/30/13 2113  . antiseptic oral rinse (BIOTENE) solution 15 mL  15 mL Mouth Rinse QID Merwyn Katos, MD   15 mL at 12/01/13 0437  . antiseptic oral rinse (BIOTENE) solution 15 mL  15 mL Mouth Rinse BID Calvert Cantor, MD   15 mL at 11/30/13 2114  . bisacodyl (DULCOLAX) EC tablet 5 mg  5 mg Oral BID Calvert Cantor, MD   5 mg at 11/30/13 2224  . docusate sodium  (COLACE) capsule 100 mg  100 mg Oral BID Nelda Bucks, MD   100 mg at 11/30/13 2224  . fentaNYL (SUBLIMAZE) injection 12.5-25 mcg  12.5-25 mcg Intravenous Q2H PRN Merwyn Katos, MD      . furosemide (LASIX) injection 40 mg  40 mg Intravenous BID Nelda Bucks, MD   40 mg at 11/30/13 1711  . heparin ADULT infusion 100 units/mL (25000 units/250 mL)  2,300 Units/hr Intravenous Continuous Nelda Bucks, MD 23 mL/hr at 11/30/13 2012 2,300 Units/hr at 11/30/13 2012  . insulin aspart (novoLOG) injection 0-15 Units  0-15 Units Subcutaneous TID WC Bernadene Person, NP   2 Units at 11/30/13 1715  . ipratropium (ATROVENT) nebulizer solution 0.5 mg  0.5 mg Nebulization Q6H WA Coralyn Helling, MD   0.5 mg at 12/01/13 0210  . labetalol (NORMODYNE,TRANDATE) injection 10 mg  10 mg Intravenous Q2H PRN Chuck Hint, MD      . lactulose (CHRONULAC) 10 GM/15ML solution 20 g  20 g Oral Daily PRN Nelda Bucks, MD   20 g at 11/27/13 1653  . levalbuterol (XOPENEX) nebulizer solution 0.63 mg  0.63 mg Nebulization Q6H WA Coralyn Helling, MD   0.63 mg at 12/01/13 0210  .  levalbuterol (XOPENEX) nebulizer solution 0.63 mg  0.63 mg Nebulization Q2H PRN Coralyn Helling, MD      . metoprolol (LOPRESSOR) tablet 50 mg  50 mg Oral Q8H Calvert Cantor, MD   50 mg at 12/01/13 0607  . ondansetron (ZOFRAN) injection 4 mg  4 mg Intravenous Q6H PRN Chuck Hint, MD      . pantoprazole (PROTONIX) EC tablet 40 mg  40 mg Oral Daily Sallee Provencal, RPH   40 mg at 11/30/13 1205  . phenol (CHLORASEPTIC) mouth spray 1 spray  1 spray Mouth/Throat PRN Chuck Hint, MD      . polyethylene glycol (MIRALAX / GLYCOLAX) packet 17 g  17 g Oral BID Calvert Cantor, MD   17 g at 11/30/13 2224  . warfarin (COUMADIN) video   Does not apply Once Nelda Bucks, MD      . Warfarin - Pharmacist Dosing Inpatient   Does not apply q1800 Nelda Bucks, MD        No Known Allergies  Review of Systems negative  except from HPI and PMH  Physical Exam BP 114/74  Pulse 112  Temp(Src) 97.3 F (36.3 C) (Axillary)  Resp 18  Ht 5\' 7"  (1.702 m)  Wt 218 lb 0.6 oz (98.9 kg)  BMI 34.14 kg/m2  SpO2 97% Well developed and well nourished in no acute distress HENT normal E scleral and icterus clear Neck Supple JVP flat; carotids brisk and full Clear to ausculation Irregularly irregular rate and rhythm, no murmurs gallops or rub Soft with active bowel sounds No clubbing cyanosis swollen arms Edema Alert and oriented, grossly normal motor and sensory function Skin Warm and Dry    Assessment and  Plan  S/p embolectomy  Afib  Prior CVa  Normal LV function  CAD  contijnue amio for rate control; will defer decision re restoration of nsr to cardilogist to assume care  unfortuntaely echo failed to comment on LA size/dimension>>limited study Would also lean toward NOAC, apioxban for risk benefit, esp with mild renal insufficiency, but this transition can be done later

## 2013-12-01 NOTE — Progress Notes (Addendum)
CSW received call from pt's son-in-law stating family chooses [facility]. CSW called Alcario Drought with [facility] and left her a voicemail letting her know pt is choosing [facility] and will likely discharge Monday or Tuesday. CSW requested Alcario Drought call back and leave a Games developer authorization # for transportation.  Addendum: Phone numbers for pt's wife: [phone] (home) and [phone] (cell).  Maryclare Labrador, MSW, [facility] Clinical Social Worker [phone]

## 2013-12-01 NOTE — Progress Notes (Signed)
CSW received call from pt's son-in-law stating they called [facility] and [facility] wanted to speak with CSW. CSW called and they explained CSW needs to notify [facility] that pt will not be going to their facility. CSW has done this. [facility] working on [employer] now. CSW waiting to hear back from [facility] for transportation auth #.   Maryclare Labrador, MSW, [facility] Clinical Social Worker [phone]

## 2013-12-01 NOTE — Progress Notes (Signed)
[facility] Progress Note [name] TEAM 1 - Stepdown/ICU TEAM   [name] [name] ZOX:[id number] DOB: 02/20/1931 DOA: 11/23/2013 PCP: No primary provider on file.  Brief narrative: 79 y/o male with h/o HTN, COPD, NIRDM, A-fib only on ASA 81 mg, PVD. Pt was transferred from [facility] for sudden onset of weakness and discoloration of right lower extremity. He initially underwent a stroke work up at [facility] which was essentially negative. An arterial doppler reveal poor flow in right lower extremity and no flow in right common femoral and SFA. He underwent a right femoral embolectomy and angioplasty of right femoral artery on 12/12 and remained intubated post op. Post op course complicated by A-fib with RVR and hypotension- started on Amiodarone on 12/13.   Assessment/Plan: Principal Problem:   Right Leg Ischemia - embolic event  - s/p right femoral embolectomy and angioplasty - management per vascular surgery  Active Problems:    Atrial fibrillation - CHADSVAsc is 6 - cont Amiodarone - cont Heparin and Coumadin - Possible plans to switch to NOAC per cardiology     Acute respiratory failure with hypoxia Now extubated - see below- receiving diuretics     Acute pulmonary edema - cxr from today reveals continued edema however, patient has improved clinically to where he is now on room air and with no resp distress - cont Lasix and follow    Hypotension-  - also improved- currently receiving Metoprolol to assist in rate control    H/O: CVA (cerebrovascular accident)    DM type 2 - cont current sliding scale - A1c 5.5    Code Status: Full code  Family Communication: none Disposition Plan: SNF  Consultants: PCCM Vascular surgery Cardiology  Procedures: 12/12 Right femoral embolectomy. Bovine pericardial patch angioplasty of the right femoral artery  12/12 2D echo>>>EF 60-65%, calcified aortic valve  ETT 12/11 >> 12/12  Antibiotics: Cefuroxime (peri-op)  12/11 >> 12/12   DVT prophylaxis: Warfarin/ Heparin  HPI/Subjective: Patient alert- appears comfortable- no complaints   Objective: Blood pressure 115/82, pulse 106, temperature 97.7 F (36.5 C), temperature source Oral, resp. rate 22, height 5\' 7"  (1.702 m), weight 98.9 kg (218 lb 0.6 oz), SpO2 92.00%.  Intake/Output Summary (Last 24 hours) at 12/01/13 1711 Last data filed at 12/01/13 1622  Gross per 24 hour  Intake  445.2 ml  Output   2550 ml  Net -2104.8 ml     Exam: General: No acute respiratory distress Lungs: Clear to auscultation bilaterally without wheezes or crackles Cardiovascular: IIRR, 2/6 murmur at left lower sternal border Abdomen: Nontender, nondistended, soft, bowel sounds positive, no rebound, no ascites, no appreciable mass Extremities: No significant cyanosis, clubbing, - b/l pitting edema R>L  Data Reviewed: Basic Metabolic Panel:  Recent Labs Lab 11/27/13 0414 11/28/13 0400 11/29/13 0410 11/30/13 0415 12/01/13 0445  NA 135 136 137 137 137  K 3.7 4.1 4.0 4.0 4.0  CL 101 102 101 101 101  CO2 23 26 27 28 29   GLUCOSE 132* 160* 159* 135* 110*  BUN 30* 26* 24* 29* 28*  CREATININE 1.42* 1.22 1.28 1.32 1.26  CALCIUM 8.6 8.9 8.9 9.1 9.0  MG  --   --  1.9 1.9 1.9  PHOS  --   --  1.7* 2.3 2.8   Liver Function Tests:  Recent Labs Lab 11/28/13 0400  AST 46*  ALT 25  ALKPHOS 74  BILITOT 1.2  PROT 6.1  ALBUMIN 2.8*   No results found for this basename: LIPASE, AMYLASE,  in the last 168 hours No results found for this basename: AMMONIA,  in the last 168 hours CBC:  Recent Labs Lab 11/27/13 0940 11/27/13 1300 11/27/13 2000 11/28/13 0400 11/29/13 0410 11/30/13 0415 12/01/13 0445  WBC 7.4 7.5 8.2 8.7 6.4 6.4 6.8  NEUTROABS  --  5.9  --   --   --   --   --   HGB 10.8* 11.1* 10.5* 10.8* 10.0* 10.0* 10.1*  HCT 33.4* 33.6* 32.0* 32.8* 30.5* 30.6* 31.2*  MCV 95.4 95.5 94.7 95.1 94.7 94.4 95.4  PLT 127* 136* 136* 123* 123* 131* 145*    Cardiac Enzymes: No results found for this basename: CKTOTAL, CKMB, CKMBINDEX, TROPONINI,  in the last 168 hours BNP (last 3 results) No results found for this basename: PROBNP,  in the last 8760 hours CBG:  Recent Labs Lab 11/30/13 1715 11/30/13 2219 12/01/13 0753 12/01/13 1237 12/01/13 1620  GLUCAP 130* 111* 113* 184* 127*    Recent Results (from the past 240 hour(s))  MRSA PCR SCREENING     Status: None   Collection Time    11/24/13  1:39 AM      Result Value Range Status   MRSA by PCR NEGATIVE  NEGATIVE Final   Comment:            The GeneXpert MRSA Assay (FDA     approved for NASAL specimens     only), is one component of a     comprehensive MRSA colonization     surveillance program. It is not     intended to diagnose MRSA     infection nor to guide or     monitor treatment for     MRSA infections.  URINE CULTURE     Status: None   Collection Time    11/27/13  1:48 PM      Result Value Range Status   Specimen Description URINE, CATHETERIZED   Final   Special Requests NONE   Final   Culture  Setup Time     Final   Value: 11/27/2013 15:20     Performed at [facility] Count     Final   Value: NO GROWTH     Performed at [facility]   Culture     Final   Value: NO GROWTH     Performed at [facility]   Report Status 11/28/2013 FINAL   Final     Studies:  Recent x-ray studies have been reviewed in detail by the Attending Physician  Scheduled Meds:  Scheduled Meds: . amiodarone  400 mg Oral BID  . antiseptic oral rinse  15 mL Mouth Rinse QID  . antiseptic oral rinse  15 mL Mouth Rinse BID  . bisacodyl  5 mg Oral BID  . docusate sodium  100 mg Oral BID  . furosemide  40 mg Intravenous BID  . insulin aspart  0-15 Units Subcutaneous TID WC  . ipratropium  0.5 mg Nebulization Q6H WA  . levalbuterol  0.63 mg Nebulization Q6H WA  . metoprolol tartrate  50 mg Oral Q8H  . pantoprazole  40 mg Oral Daily  . polyethylene  glycol  17 g Oral BID  . warfarin  5 mg Oral ONCE-1800  . warfarin   Does not apply Once  . Warfarin - Pharmacist Dosing Inpatient   Does not apply q1800   Continuous Infusions: . amiodarone (NEXTERONE PREMIX) 360 mg/200 mL dextrose    . heparin  2,300 Units/hr (12/01/13 0915)    Time spent on care of this patient: 35 min   Cleaven Demario, MD  [facility] Office  [phone] Pager - Text Page per Amion as per below:  On-Call/Text Page:      [email or web address]      password [email or web address]  If 7PM-7AM, please contact night-coverage [email or web address] Password [email or web address] 12/01/2013, 5:11 PM   LOS: 8 days

## 2013-12-02 LAB — CBC
Platelets: 168 10*3/uL (ref 150–400)
RBC: 3.4 MIL/uL — ABNORMAL LOW (ref 4.22–5.81)
WBC: 7.5 10*3/uL (ref 4.0–10.5)

## 2013-12-02 LAB — GLUCOSE, CAPILLARY
Glucose-Capillary: 110 mg/dL — ABNORMAL HIGH (ref 70–99)
Glucose-Capillary: 126 mg/dL — ABNORMAL HIGH (ref 70–99)

## 2013-12-02 LAB — BASIC METABOLIC PANEL
Calcium: 9.2 mg/dL (ref 8.4–10.5)
GFR calc Af Amer: 63 mL/min — ABNORMAL LOW (ref >90)
GFR calc non Af Amer: 55 mL/min — ABNORMAL LOW (ref >90)
Sodium: 134 mEq/L — ABNORMAL LOW (ref 135–145)

## 2013-12-02 LAB — PROTIME-INR
INR: 2 — ABNORMAL HIGH (ref 0.00–1.49)
Prothrombin Time: 22.1 seconds — ABNORMAL HIGH (ref 11.6–15.2)

## 2013-12-02 LAB — HEPARIN LEVEL (UNFRACTIONATED): Heparin Unfractionated: 0.48 IU/mL (ref 0.30–0.70)

## 2013-12-02 MED ORDER — FUROSEMIDE 40 MG PO TABS
40.0000 mg | ORAL_TABLET | Freq: Every day | ORAL | Status: DC
  Administered 2013-12-02 – 2013-12-03 (×2): 40 mg via ORAL
  Filled 2013-12-02 (×2): qty 1

## 2013-12-02 MED ORDER — LORAZEPAM 0.5 MG PO TABS
0.5000 mg | ORAL_TABLET | Freq: Once | ORAL | Status: AC
  Administered 2013-12-02: 0.5 mg via ORAL
  Filled 2013-12-02: qty 1

## 2013-12-02 MED ORDER — WARFARIN SODIUM 2.5 MG PO TABS
2.5000 mg | ORAL_TABLET | Freq: Once | ORAL | Status: AC
  Administered 2013-12-02: 2.5 mg via ORAL
  Filled 2013-12-02 (×2): qty 1

## 2013-12-02 NOTE — Progress Notes (Signed)
Patient Name: [name] [name] Date of Encounter: 12/02/2013     Principal Problem:   Ischemic leg Active Problems:   Atrial fibrillation   Acute respiratory failure with hypoxia   Hypotension   H/O: CVA (cerebrovascular accident)   COPD (chronic obstructive pulmonary disease)   Acute pulmonary edema   DM type 2, controlled, with complication    SUBJECTIVE  Patient denies any chest pain or shortness of breath.  Rhythm remains atrial fibrillation with controlled ventricular response.  Weight is down 2 pounds overnight.  CURRENT MEDS . amiodarone  400 mg Oral BID  . antiseptic oral rinse  15 mL Mouth Rinse QID  . antiseptic oral rinse  15 mL Mouth Rinse BID  . bisacodyl  5 mg Oral BID  . docusate sodium  100 mg Oral BID  . insulin aspart  0-15 Units Subcutaneous TID WC  . ipratropium  0.5 mg Nebulization Q6H WA  . levalbuterol  0.63 mg Nebulization Q6H WA  . metoprolol tartrate  50 mg Oral Q8H  . pantoprazole  40 mg Oral Daily  . polyethylene glycol  17 g Oral BID  . warfarin  2.5 mg Oral ONCE-1800  . Warfarin - Pharmacist Dosing Inpatient   Does not apply q1800    OBJECTIVE  Filed Vitals:   12/02/13 0517 12/02/13 0800 12/02/13 0926 12/02/13 1147  BP: 109/63 111/75    Pulse: 90 63  99  Temp:    97.8 F (36.6 C)  TempSrc:    Oral  Resp:  17  18  Height:      Weight:      SpO2:  98% 100% 93%    Intake/Output Summary (Last 24 hours) at 12/02/13 1223 Last data filed at 12/02/13 1100  Gross per 24 hour  Intake    598 ml  Output   3325 ml  Net  -2727 ml   Filed Weights   11/30/13 0600 12/01/13 0417 12/02/13 0412  Weight: 219 lb 5.7 oz (99.5 kg) 218 lb 0.6 oz (98.9 kg) 216 lb 0.8 oz (98 kg)    PHYSICAL EXAM  General: Pleasant, NAD. Neuro: Alert and oriented X 3. Moves all extremities spontaneously. Psych: Normal affect. HEENT:  Normal  Neck: Supple without bruits or JVD. Lungs:  Mild basilar rales bilaterally Heart: Irregular rhythm.  No murmur gallop or  rub Abdomen: Soft, non-tender, non-distended, BS + x 4.  Extremities: No pitting edema Accessory Clinical Findings  CBC  Recent Labs  12/01/13 0445 12/02/13 0415  WBC 6.8 7.5  HGB 10.1* 10.6*  HCT 31.2* 32.2*  MCV 95.4 94.7  PLT 145* 168   Basic Metabolic Panel  Recent Labs  11/30/13 0415 12/01/13 0445 12/02/13 0900  NA 137 137 134*  K 4.0 4.0 3.9  CL 101 101 97  CO2 28 29 29   GLUCOSE 135* 110* 149*  BUN 29* 28* 25*  CREATININE 1.32 1.26 1.21  CALCIUM 9.1 9.0 9.2  MG 1.9 1.9  --   PHOS 2.3 2.8  --    Liver Function Tests No results found for this basename: AST, ALT, ALKPHOS, BILITOT, PROT, ALBUMIN,  in the last 72 hours No results found for this basename: LIPASE, AMYLASE,  in the last 72 hours Cardiac Enzymes No results found for this basename: CKTOTAL, CKMB, CKMBINDEX, TROPONINI,  in the last 72 hours BNP No components found with this basename: POCBNP,  D-Dimer No results found for this basename: DDIMER,  in the last 72 hours Hemoglobin A1C No results  found for this basename: HGBA1C,  in the last 72 hours Fasting Lipid Panel No results found for this basename: CHOL, HDL, LDLCALC, TRIG, CHOLHDL, LDLDIRECT,  in the last 72 hours Thyroid Function Tests No results found for this basename: TSH, T4TOTAL, FREET3, T3FREE, THYROIDAB,  in the last 72 hours  TELE  Atrial fibrillation with controlled ventricular response  ECG    Radiology/Studies  Dg Chest Port 1 View  12/01/2013   CLINICAL DATA:  Pulmonary edema.  EXAM: PORTABLE CHEST - 1 VIEW  COMPARISON:  11/29/2013.  FINDINGS: Left IJ line in stable position. Right carotid stent noted. Persistent prominent cardiomegaly is present. Pulmonary venous congestion and bilateral interstitial and alveolar infiltrates are noted consistent with persistent congestive heart failure and pulmonary edema. Superimposed pneumonia in the right lung base cannot be excluded. Small right pleural effusion cannot be excluded. No  pneumothorax. No acute osseous abnormality.  IMPRESSION: Persistent unchanged congestive heart failure with pulmonary edema.   Electronically Signed   By: Maisie Fus  Register   On: 12/01/2013 07:40   Dg Chest Port 1 View  11/29/2013   CLINICAL DATA:  Assess edema.  EXAM: PORTABLE CHEST - 1 VIEW  COMPARISON:  11/28/2013  FINDINGS: Left jugular central line is near the junction of the superior vena cava and left innominate vein. Interstitial pulmonary edema has slightly improved. There continues to be increased densities at the right lung base. Heart size is mildly enlarged. Vascular stent on the right side of the neck.  IMPRESSION: Interstitial edema has minimally improved. There continues to be increased densities at the right lung base.   Electronically Signed   By: Richarda Overlie M.D.   On: 11/29/2013 07:41   Dg Chest Port 1 View  11/28/2013   CLINICAL DATA:  Shortness of breath.  EXAM: PORTABLE CHEST - 1 VIEW  COMPARISON:  11/27/2013.  FINDINGS: Left IJ catheter in good anatomic position. Right carotid stent. Cardiomegaly with pulmonary venous congestion and prominent bilateral pulmonary interstitial and alveolar infiltrates. Small right pleural effusion may be present. These findings are consistent with congestive heart failure with pulmonary edema. Superimposed pneumonia cannot be excluded. No pneumothorax. Degenerative changes both shoulders.  IMPRESSION: 1. Congestive heart failure with bilateral pulmonary edema. Small right pleural effusion may be present. Superimposed pneumonia cannot be excluded. 2. Left IJ catheter in stable position.  Right carotid stent.   Electronically Signed   By: Maisie Fus  Register   On: 11/28/2013 07:24   Dg Chest Port 1 View  11/27/2013   CLINICAL DATA:  Line placement  EXAM: PORTABLE CHEST - 1 VIEW  COMPARISON:  11/25/2013; 11/24/2013; 11/22/2013; 11/25/2010  FINDINGS: Grossly unchanged enlarged cardiac silhouette and mediastinal contours. Interval placement of left jugular  approach central venous catheter with tip projected over the superior/ mid SVC. No pneumothorax. Bibasilar heterogeneous slightly nodular opacities are grossly unchanged, right greater than left. The pulmonary vasculature appears slightly indistinct, in particular within the right upper and mid lung. No definite pleural effusion. Grossly unchanged bones.  IMPRESSION: 1. Interval placement of left jugular approach under venous catheter with tip projected over to the superior/mid SVC. No pneumothorax. 2. Persistent findings of bibasilar opacities, right greater than left, atelectasis versus infiltrate. 3. Cardiomegaly and possible mild asymmetric pulmonary edema, right greater than left.   Electronically Signed   By: Simonne Come M.D.   On: 11/27/2013 14:03   Dg Chest Port 1 View  11/25/2013   CLINICAL DATA:  Respiratory failure  EXAM: PORTABLE CHEST - 1 VIEW  COMPARISON:  11/24/2013  FINDINGS: Cardiac shadow is stable. The endotracheal tube and nasogastric catheter been removed in the interval. Bibasilar atelectatic changes are noted. No focal confluent infiltrate is seen. No acute bony abnormality is noted.  IMPRESSION: Bibasilar atelectatic changes   Electronically Signed   By: Alcide Clever M.D.   On: 11/25/2013 07:29   Portable Chest Xray  11/24/2013   CLINICAL DATA:  Endotracheal tube placement.  EXAM: PORTABLE CHEST - 1 VIEW  COMPARISON:  Chest radiograph performed 11/23/2013  FINDINGS: The patient's endotracheal tube is seen ending 4 cm above the carina. Enteric tube is noted extending below the diaphragm.  Lung expansion is mildly decreased. Vascular congestion is noted, with bilateral central airspace opacities, raising concern for mild pulmonary edema. No definite pleural effusion or pneumothorax is seen.  The cardiomediastinal silhouette is mildly enlarged. Calcification is noted within the aortic arch. No acute osseous abnormalities are identified.  IMPRESSION: 1. Endotracheal tube seen ending 4 cm  above the carina. 2. Vascular congestion and mild cardiomegaly, with bilateral central airspace opacities, raising concern for mild pulmonary edema. Previously noted right basilar opacity has improved.   Electronically Signed   By: Roanna Raider M.D.   On: 11/24/2013 02:34   Dg Chest Portable 1 View  11/23/2013   CLINICAL DATA:  Shortness of breath; preoperative chest radiograph.  EXAM: PORTABLE CHEST - 1 VIEW  COMPARISON:  Chest radiograph performed 11/22/2013  FINDINGS: The lungs are well-aerated. Mild bibasilar airspace opacities are slightly worsened from the prior study and may reflect atelectasis, mild pneumonia or possibly mild pulmonary edema, given underlying vascular congestion. No pleural effusion or pneumothorax is seen.  The cardiomediastinal silhouette is mildly enlarged. No acute osseous abnormalities are seen.  IMPRESSION: Mild bibasilar airspace opacities are slightly worsened from the recent prior study and may reflect atelectasis, mild pneumonia or possibly mild pulmonary edema, given underlying vascular congestion and mild cardiomegaly.   Electronically Signed   By: Roanna Raider M.D.   On: 11/23/2013 21:51   Dg Abd Portable 1v  11/30/2013   CLINICAL DATA:  Abdominal distention.  Diabetes.  EXAM: PORTABLE ABDOMEN - 1 VIEW  COMPARISON:  Lumbar spine series 10 19 2009.  FINDINGS: Moderate gastric distention noted. Paucity of gas is noted distally. Gastric outlet obstruction cannot be excluded. Calcific density noted over the left kidney suggesting left nephrolithiasis. Degenerative changes lumbar spine and both hips.  IMPRESSION: Cannot exclude gastric outlet obstruction. These results will be called to the ordering clinician or representative by the Radiologist Assistant, and communication documented in the PACS Dashboard.   Electronically Signed   By: Maisie Fus  Register   On: 11/30/2013 12:11  2-D echo on 11/24/13: - Left ventricle: Systolic function was normal. The estimated ejection  fraction was in the range of 60% to 65%. - Mitral valve: Mildly to moderately calcified annulus. - Pericardium, extracardiac: A trivial pericardial effusion was identified.   ASSESSMENT AND PLAN 1. status post embolectomy to right leg 2. atrial fibrillation, coumadinization in progress 3. prior CVA 4. chronic diastolic CHF  Plan: Continue diuresis.  Chest x-ray still shows CHF.  Renal function is stable.  Signed, Cassell Clement  MD

## 2013-12-02 NOTE — Progress Notes (Addendum)
[facility] Progress Note [place] TEAM 1 - Stepdown/ICU TEAM   [name] [name] WUJ:[id number] DOB: January 08, 1931 DOA: 11/23/2013 PCP: No primary provider on file.  Brief narrative: 79 y/o male with h/o HTN, COPD, NIRDM, A-fib only on ASA 81 mg, PVD. Pt was transferred from [facility] for sudden onset of weakness and discoloration of right lower extremity. He initially underwent a stroke work up at [facility] which was essentially negative. An arterial doppler reveal poor flow in right lower extremity and no flow in right common femoral and SFA. He underwent a right femoral embolectomy and angioplasty of right femoral artery on 12/12 and remained intubated post op. Post op course complicated by A-fib with RVR and hypotension- started on Amiodarone on 12/13.   Assessment/Plan: Principal Problem:   Right Leg Ischemia - embolic event  - s/p right femoral embolectomy and angioplasty - management per vascular surgery  Active Problems:    Atrial fibrillation - CHADSVAsc is 6 - cont Amiodarone - d/c heparin now that INR is therapeutic on Coumadin - Possible plans to switch to NOAC per cardiology     Acute respiratory failure with hypoxia Now extubated - see below- receiving diuretics     Acute pulmonary edema - cxr from today reveals continued edema however, patient has improved clinically to where he is now on room air and with no resp distress - Lasix switched to oral today by cards    Hypotension-  - also improved- currently receiving Metoprolol to assist in rate control    H/O: CVA (cerebrovascular accident)    DM type 2 - cont current sliding scale - A1c 5.5    Code Status: Full code  Family Communication: none Disposition Plan: SNF  Consultants: PCCM Vascular surgery Cardiology  Procedures: 12/12 Right femoral embolectomy. Bovine pericardial patch angioplasty of the right femoral artery  12/12 2D echo>>>EF 60-65%, calcified aortic valve  ETT 12/11 >>  12/12  Antibiotics: Cefuroxime (peri-op) 12/11 >> 12/12   DVT prophylaxis: Warfarin/ Heparin  HPI/Subjective: Patient alert- no complaints   Objective: Blood pressure 117/83, pulse 59, temperature 97.8 F (36.6 C), temperature source Oral, resp. rate 22, height 5\' 7"  (1.702 m), weight 98 kg (216 lb 0.8 oz), SpO2 98.00%.  Intake/Output Summary (Last 24 hours) at 12/02/13 1803 Last data filed at 12/02/13 1635  Gross per 24 hour  Intake 626.75 ml  Output   3050 ml  Net -2423.25 ml     Exam: General: No acute respiratory distress Lungs: Clear to auscultation bilaterally without wheezes or crackles Cardiovascular: IIRR, 2/6 murmur at left lower sternal border Abdomen: Nontender, nondistended, soft, bowel sounds positive, no rebound, no ascites, no appreciable mass Extremities: No significant cyanosis, clubbing, - b/l pitting edema R>L  Data Reviewed: Basic Metabolic Panel:  Recent Labs Lab 11/28/13 0400 11/29/13 0410 11/30/13 0415 12/01/13 0445 12/02/13 0900  NA 136 137 137 137 134*  K 4.1 4.0 4.0 4.0 3.9  CL 102 101 101 101 97  CO2 26 27 28 29 29   GLUCOSE 160* 159* 135* 110* 149*  BUN 26* 24* 29* 28* 25*  CREATININE 1.22 1.28 1.32 1.26 1.21  CALCIUM 8.9 8.9 9.1 9.0 9.2  MG  --  1.9 1.9 1.9  --   PHOS  --  1.7* 2.3 2.8  --    Liver Function Tests:  Recent Labs Lab 11/28/13 0400  AST 46*  ALT 25  ALKPHOS 74  BILITOT 1.2  PROT 6.1  ALBUMIN 2.8*   No results found for  this basename: LIPASE, AMYLASE,  in the last 168 hours No results found for this basename: AMMONIA,  in the last 168 hours CBC:  Recent Labs Lab 11/27/13 0940 11/27/13 1300  11/28/13 0400 11/29/13 0410 11/30/13 0415 12/01/13 0445 12/02/13 0415  WBC 7.4 7.5  < > 8.7 6.4 6.4 6.8 7.5  NEUTROABS  --  5.9  --   --   --   --   --   --   HGB 10.8* 11.1*  < > 10.8* 10.0* 10.0* 10.1* 10.6*  HCT 33.4* 33.6*  < > 32.8* 30.5* 30.6* 31.2* 32.2*  MCV 95.4 95.5  < > 95.1 94.7 94.4 95.4 94.7   PLT 127* 136*  < > 123* 123* 131* 145* 168  < > = values in this interval not displayed. Cardiac Enzymes: No results found for this basename: CKTOTAL, CKMB, CKMBINDEX, TROPONINI,  in the last 168 hours BNP (last 3 results) No results found for this basename: PROBNP,  in the last 8760 hours CBG:  Recent Labs Lab 12/01/13 1237 12/01/13 1620 12/02/13 0814 12/02/13 1150 12/02/13 1551  GLUCAP 184* 127* 110* 126* 152*    Recent Results (from the past 240 hour(s))  MRSA PCR SCREENING     Status: None   Collection Time    11/24/13  1:39 AM      Result Value Range Status   MRSA by PCR NEGATIVE  NEGATIVE Final   Comment:            The GeneXpert MRSA Assay (FDA     approved for NASAL specimens     only), is one component of a     comprehensive MRSA colonization     surveillance program. It is not     intended to diagnose MRSA     infection nor to guide or     monitor treatment for     MRSA infections.  URINE CULTURE     Status: None   Collection Time    11/27/13  1:48 PM      Result Value Range Status   Specimen Description URINE, CATHETERIZED   Final   Special Requests NONE   Final   Culture  Setup Time     Final   Value: 11/27/2013 15:20     Performed at [facility] Count     Final   Value: NO GROWTH     Performed at [facility]   Culture     Final   Value: NO GROWTH     Performed at [facility]   Report Status 11/28/2013 FINAL   Final     Studies:  Recent x-ray studies have been reviewed in detail by the Attending Physician  Scheduled Meds:  Scheduled Meds: . amiodarone  400 mg Oral BID  . antiseptic oral rinse  15 mL Mouth Rinse QID  . antiseptic oral rinse  15 mL Mouth Rinse BID  . bisacodyl  5 mg Oral BID  . docusate sodium  100 mg Oral BID  . furosemide  40 mg Oral Daily  . insulin aspart  0-15 Units Subcutaneous TID WC  . ipratropium  0.5 mg Nebulization Q6H WA  . levalbuterol  0.63 mg Nebulization Q6H WA  .  metoprolol tartrate  50 mg Oral Q8H  . pantoprazole  40 mg Oral Daily  . polyethylene glycol  17 g Oral BID  . warfarin  2.5 mg Oral ONCE-1800  . Warfarin - Pharmacist Dosing Inpatient  Does not apply q1800   Continuous Infusions:    Time spent on care of this patient: 35 min   Calvert Cantor, MD  [facility] Office  [phone] Pager - Text Page per Loretha Stapler as per below:  On-Call/Text Page:      [email or web address]      password [email or web address]  If 7PM-7AM, please contact night-coverage [email or web address] Password [email or web address] 12/02/2013, 6:03 PM   LOS: 9 days

## 2013-12-02 NOTE — Progress Notes (Signed)
ANTICOAGULATION CONSULT NOTE - Follow Up Consult  Pharmacy Consult for Heparin / Coumadin Indication: atrial fibrillation and DVT  No Known Allergies  Labs:  Recent Labs  11/30/13 0415 12/01/13 0445 12/02/13 0415  HGB 10.0* 10.1* 10.6*  HCT 30.6* 31.2* 32.2*  PLT 131* 145* 168  LABPROT 15.6* 16.8* 22.1*  INR 1.27 1.40 2.00*  HEPARINUNFRC 0.46 0.35 0.48  CREATININE 1.32 1.26  --     Estimated Creatinine Clearance: 52.2 ml/min (by C-G formula based on Cr of 1.26).   Medical History: Past Medical History  Diagnosis Date  . Hypertension   . Stroke   . COPD (chronic obstructive pulmonary disease)   . Diabetes mellitus without complication   . GERD (gastroesophageal reflux disease)   . A-fib     Assessment: Pt s/p embolectomy for R femoral DVT also has h/o AFib, CHADS2 = 4. H/o stroke 1997.  CBC stable HL therapeutic INR = 2  Goal of Therapy:  HL 0.3-0.7 INR 2-3 Monitor platelets by anticoagulation protocol: Yes   Plan:  Continue heparin 2300 units/hour Warfarin 2.5 mg x1 tonight Daily CBC, HL, PT/INR  Thank you. Okey Regal, PharmD [phone]  12/02/2013 8:38 AM

## 2013-12-02 NOTE — Progress Notes (Signed)
Subjective: Interval History: none..   Objective: Vital signs in last 24 hours: Temp:  [97.4 F (36.3 C)-98 F (36.7 C)] 98 F (36.7 C) (12/20 0412) Pulse Rate:  [63-119] 63 (12/20 0800) Resp:  [17-23] 17 (12/20 0800) BP: (103-133)/(63-90) 111/75 mmHg (12/20 0800) SpO2:  [92 %-99 %] 98 % (12/20 0800) Weight:  [216 lb 0.8 oz (98 kg)] 216 lb 0.8 oz (98 kg) (12/20 0412)  Intake/Output from previous day: 12/19 0701 - 12/20 0700 In: 552 [I.V.:552] Out: 2200 [Urine:2200] Intake/Output this shift: Total I/O In: 46 [I.V.:46] Out: -   Comfortable respiratory status extubated. Right groin healing was well-perfused lower extremity  Lab Results:  Recent Labs  12/01/13 0445 12/02/13 0415  WBC 6.8 7.5  HGB 10.1* 10.6*  HCT 31.2* 32.2*  PLT 145* 168   BMET  Recent Labs  11/30/13 0415 12/01/13 0445  NA 137 137  K 4.0 4.0  CL 101 101  CO2 28 29  GLUCOSE 135* 110*  BUN 29* 28*  CREATININE 1.32 1.26  CALCIUM 9.1 9.0    Studies/Results: Dg Chest Port 1 View  12/01/2013   CLINICAL DATA:  Pulmonary edema.  EXAM: PORTABLE CHEST - 1 VIEW  COMPARISON:  11/29/2013.  FINDINGS: Left IJ line in stable position. Right carotid stent noted. Persistent prominent cardiomegaly is present. Pulmonary venous congestion and bilateral interstitial and alveolar infiltrates are noted consistent with persistent congestive heart failure and pulmonary edema. Superimposed pneumonia in the right lung base cannot be excluded. Small right pleural effusion cannot be excluded. No pneumothorax. No acute osseous abnormality.  IMPRESSION: Persistent unchanged congestive heart failure with pulmonary edema.   Electronically Signed   By: Maisie Fus  Register   On: 12/01/2013 07:40   Dg Chest Port 1 View  11/29/2013   CLINICAL DATA:  Assess edema.  EXAM: PORTABLE CHEST - 1 VIEW  COMPARISON:  11/28/2013  FINDINGS: Left jugular central line is near the junction of the superior vena cava and left innominate vein.  Interstitial pulmonary edema has slightly improved. There continues to be increased densities at the right lung base. Heart size is mildly enlarged. Vascular stent on the right side of the neck.  IMPRESSION: Interstitial edema has minimally improved. There continues to be increased densities at the right lung base.   Electronically Signed   By: Richarda Overlie M.D.   On: 11/29/2013 07:41   Dg Chest Port 1 View  11/28/2013   CLINICAL DATA:  Shortness of breath.  EXAM: PORTABLE CHEST - 1 VIEW  COMPARISON:  11/27/2013.  FINDINGS: Left IJ catheter in good anatomic position. Right carotid stent. Cardiomegaly with pulmonary venous congestion and prominent bilateral pulmonary interstitial and alveolar infiltrates. Small right pleural effusion may be present. These findings are consistent with congestive heart failure with pulmonary edema. Superimposed pneumonia cannot be excluded. No pneumothorax. Degenerative changes both shoulders.  IMPRESSION: 1. Congestive heart failure with bilateral pulmonary edema. Small right pleural effusion may be present. Superimposed pneumonia cannot be excluded. 2. Left IJ catheter in stable position.  Right carotid stent.   Electronically Signed   By: Maisie Fus  Register   On: 11/28/2013 07:24   Dg Chest Port 1 View  11/27/2013   CLINICAL DATA:  Line placement  EXAM: PORTABLE CHEST - 1 VIEW  COMPARISON:  11/25/2013; 11/24/2013; 11/22/2013; 11/25/2010  FINDINGS: Grossly unchanged enlarged cardiac silhouette and mediastinal contours. Interval placement of left jugular approach central venous catheter with tip projected over the superior/ mid SVC. No pneumothorax. Bibasilar heterogeneous slightly nodular  opacities are grossly unchanged, right greater than left. The pulmonary vasculature appears slightly indistinct, in particular within the right upper and mid lung. No definite pleural effusion. Grossly unchanged bones.  IMPRESSION: 1. Interval placement of left jugular approach under venous  catheter with tip projected over to the superior/mid SVC. No pneumothorax. 2. Persistent findings of bibasilar opacities, right greater than left, atelectasis versus infiltrate. 3. Cardiomegaly and possible mild asymmetric pulmonary edema, right greater than left.   Electronically Signed   By: Simonne Come M.D.   On: 11/27/2013 14:03   Dg Chest Port 1 View  11/25/2013   CLINICAL DATA:  Respiratory failure  EXAM: PORTABLE CHEST - 1 VIEW  COMPARISON:  11/24/2013  FINDINGS: Cardiac shadow is stable. The endotracheal tube and nasogastric catheter been removed in the interval. Bibasilar atelectatic changes are noted. No focal confluent infiltrate is seen. No acute bony abnormality is noted.  IMPRESSION: Bibasilar atelectatic changes   Electronically Signed   By: Alcide Clever M.D.   On: 11/25/2013 07:29   Portable Chest Xray  11/24/2013   CLINICAL DATA:  Endotracheal tube placement.  EXAM: PORTABLE CHEST - 1 VIEW  COMPARISON:  Chest radiograph performed 11/23/2013  FINDINGS: The patient's endotracheal tube is seen ending 4 cm above the carina. Enteric tube is noted extending below the diaphragm.  Lung expansion is mildly decreased. Vascular congestion is noted, with bilateral central airspace opacities, raising concern for mild pulmonary edema. No definite pleural effusion or pneumothorax is seen.  The cardiomediastinal silhouette is mildly enlarged. Calcification is noted within the aortic arch. No acute osseous abnormalities are identified.  IMPRESSION: 1. Endotracheal tube seen ending 4 cm above the carina. 2. Vascular congestion and mild cardiomegaly, with bilateral central airspace opacities, raising concern for mild pulmonary edema. Previously noted right basilar opacity has improved.   Electronically Signed   By: Roanna Raider M.D.   On: 11/24/2013 02:34   Dg Chest Portable 1 View  11/23/2013   CLINICAL DATA:  Shortness of breath; preoperative chest radiograph.  EXAM: PORTABLE CHEST - 1 VIEW   COMPARISON:  Chest radiograph performed 11/22/2013  FINDINGS: The lungs are well-aerated. Mild bibasilar airspace opacities are slightly worsened from the prior study and may reflect atelectasis, mild pneumonia or possibly mild pulmonary edema, given underlying vascular congestion. No pleural effusion or pneumothorax is seen.  The cardiomediastinal silhouette is mildly enlarged. No acute osseous abnormalities are seen.  IMPRESSION: Mild bibasilar airspace opacities are slightly worsened from the recent prior study and may reflect atelectasis, mild pneumonia or possibly mild pulmonary edema, given underlying vascular congestion and mild cardiomegaly.   Electronically Signed   By: Roanna Raider M.D.   On: 11/23/2013 21:51   Dg Abd Portable 1v  11/30/2013   CLINICAL DATA:  Abdominal distention.  Diabetes.  EXAM: PORTABLE ABDOMEN - 1 VIEW  COMPARISON:  Lumbar spine series 10 19 2009.  FINDINGS: Moderate gastric distention noted. Paucity of gas is noted distally. Gastric outlet obstruction cannot be excluded. Calcific density noted over the left kidney suggesting left nephrolithiasis. Degenerative changes lumbar spine and both hips.  IMPRESSION: Cannot exclude gastric outlet obstruction. These results will be called to the ordering clinician or representative by the Radiologist Assistant, and communication documented in the PACS Dashboard.   Electronically Signed   By: Maisie Fus  Register   On: 11/30/2013 12:11   Anti-infectives: Anti-infectives   Start     Dose/Rate Route Frequency Ordered Stop   11/24/13 0600  cefUROXime (ZINACEF) 1.5 g  in dextrose 5 % 50 mL IVPB     1.5 g 100 mL/hr over 30 Minutes Intravenous Every 12 hours 11/24/13 0142 11/24/13 1855      Assessment/Plan: s/p Procedure(s) with comments: Right Femoral EMBOLECTOMY (Right) - Right Femoral Embolectomy with Bovine Paricardium patch angioplasty. Stable from a vascular standpoint. Mobilization per medical service   LOS: 9 days   Tonie Vizcarrondo,  Nathen Balaban 12/02/2013, 8:48 AM

## 2013-12-03 DIAGNOSIS — J449 Chronic obstructive pulmonary disease, unspecified: Secondary | ICD-10-CM

## 2013-12-03 LAB — BASIC METABOLIC PANEL
BUN: 22 mg/dL (ref 6–23)
Chloride: 97 mEq/L (ref 96–112)
Creatinine, Ser: 1.19 mg/dL (ref 0.50–1.35)
GFR calc Af Amer: 65 mL/min — ABNORMAL LOW (ref >90)
Potassium: 3.7 mEq/L (ref 3.5–5.1)

## 2013-12-03 LAB — PROTIME-INR
INR: 2.61 — ABNORMAL HIGH (ref 0.00–1.49)
Prothrombin Time: 27 seconds — ABNORMAL HIGH (ref 11.6–15.2)

## 2013-12-03 LAB — GLUCOSE, CAPILLARY
Glucose-Capillary: 123 mg/dL — ABNORMAL HIGH (ref 70–99)
Glucose-Capillary: 109 mg/dL — ABNORMAL HIGH (ref 70–99)
Glucose-Capillary: 170 mg/dL — ABNORMAL HIGH (ref 70–99)

## 2013-12-03 LAB — CBC
Hemoglobin: 10.7 g/dL — ABNORMAL LOW (ref 13.0–17.0)
MCH: 30.6 pg (ref 26.0–34.0)
MCHC: 32.1 g/dL (ref 30.0–36.0)
Platelets: 161 10*3/uL (ref 150–400)

## 2013-12-03 MED ORDER — SODIUM CHLORIDE 0.9 % IV BOLUS (SEPSIS)
250.0000 mL | Freq: Once | INTRAVENOUS | Status: AC
  Administered 2013-12-03: 250 mL via INTRAVENOUS

## 2013-12-03 MED ORDER — TERAZOSIN HCL 5 MG PO CAPS
5.0000 mg | ORAL_CAPSULE | ORAL | Status: DC
  Administered 2013-12-03 – 2013-12-05 (×2): 5 mg via ORAL
  Filled 2013-12-03 (×2): qty 1

## 2013-12-03 MED ORDER — FAMOTIDINE 20 MG PO TABS
20.0000 mg | ORAL_TABLET | Freq: Two times a day (BID) | ORAL | Status: DC
  Administered 2013-12-03 – 2013-12-05 (×5): 20 mg via ORAL
  Filled 2013-12-03 (×6): qty 1

## 2013-12-03 MED ORDER — SODIUM CHLORIDE 0.9 % IV BOLUS (SEPSIS): 500.0000 mL | Freq: Once | INTRAVENOUS | Status: DC

## 2013-12-03 MED ORDER — GUAIFENESIN ER 600 MG PO TB12
600.0000 mg | ORAL_TABLET | Freq: Two times a day (BID) | ORAL | Status: DC
  Administered 2013-12-03 – 2013-12-05 (×5): 600 mg via ORAL
  Filled 2013-12-03 (×6): qty 1

## 2013-12-03 NOTE — Progress Notes (Signed)
ANTICOAGULATION CONSULT NOTE - Follow Up Consult  Pharmacy Consult for Coumadin Indication:  Afib  No Known Allergies  Labs:  Recent Labs  12/01/13 0445 12/02/13 0415 12/02/13 0900 12/03/13 0520  HGB 10.1* 10.6*  --  10.7*  HCT 31.2* 32.2*  --  33.3*  PLT 145* 168  --  161  LABPROT 16.8* 22.1*  --  27.0*  INR 1.40 2.00*  --  2.61*  HEPARINUNFRC 0.35 0.48  --   --   CREATININE 1.26  --  1.21 1.19    Estimated Creatinine Clearance: 55.3 ml/min (by C-G formula based on Cr of 1.19).   Medical History: Past Medical History  Diagnosis Date  . Hypertension   . Stroke   . COPD (chronic obstructive pulmonary disease)   . Diabetes mellitus without complication   . GERD (gastroesophageal reflux disease)   . A-fib     Assessment: Pt s/p embolectomy for R femoral DVT also has h/o AFib, CHADS2 = 4. H/o stroke 1997.  CBC stable INR = 2.61 (up from 2, also on amiodarone) Heparin dced  Goal of Therapy:   INR 2-3 Monitor platelets by anticoagulation protocol: Yes   Plan:  Hold Coumadin today Daily CBC, HL, PT/INR  Thank you. Okey Regal, PharmD [phone]  12/03/2013 10:14 AM

## 2013-12-03 NOTE — Progress Notes (Signed)
No urine output since 10 AM- bladder scan- 150 cc- will give 250cc bolus- stop lasix and follow- no further loose stools since this AM- eating/ drinking reasonably well  Calvert Cantor, MD

## 2013-12-03 NOTE — Progress Notes (Signed)
Patient Name: [name] [name] Date of Encounter: 12/03/2013     Principal Problem:   Ischemic leg Active Problems:   Atrial fibrillation   Acute respiratory failure with hypoxia   Hypotension   H/O: CVA (cerebrovascular accident)   COPD (chronic obstructive pulmonary disease)   Acute pulmonary edema   DM type 2, controlled, with complication    SUBJECTIVE  No chest pain.  Still mildly dyspneic.  Remains in atrial fibrillation with controlled ventricular response  CURRENT MEDS . amiodarone  400 mg Oral BID  . antiseptic oral rinse  15 mL Mouth Rinse QID  . antiseptic oral rinse  15 mL Mouth Rinse BID  . famotidine  20 mg Oral BID  . furosemide  40 mg Oral Daily  . guaiFENesin  600 mg Oral BID  . insulin aspart  0-15 Units Subcutaneous TID WC  . ipratropium  0.5 mg Nebulization Q6H WA  . levalbuterol  0.63 mg Nebulization Q6H WA  . metoprolol tartrate  50 mg Oral Q8H  . terazosin  5 mg Oral QODAY  . Warfarin - Pharmacist Dosing Inpatient   Does not apply q1800    OBJECTIVE  Filed Vitals:   12/03/13 0700 12/03/13 0800 12/03/13 0911 12/03/13 1100  BP: 130/91 121/70  111/85  Pulse: 110 112  31  Temp:  97 F (36.1 C)    TempSrc:  Axillary    Resp: 17 19  20   Height:      Weight:      SpO2: 97% 99% 98% 97%    Intake/Output Summary (Last 24 hours) at 12/03/13 1117 Last data filed at 12/03/13 1000  Gross per 24 hour  Intake      0 ml  Output   1675 ml  Net  -1675 ml   Filed Weights   11/30/13 0600 12/01/13 0417 12/02/13 0412  Weight: 219 lb 5.7 oz (99.5 kg) 218 lb 0.6 oz (98.9 kg) 216 lb 0.8 oz (98 kg)    PHYSICAL EXAM  General: Pleasant, NAD. Neuro: Alert and oriented X 3. Moves all extremities spontaneously. Psych: Normal affect. HEENT:  Normal  Neck: Supple without bruits or JVD. Lungs:  Mild basilar rales Heart: Irregular rhythm no s3, s4, or murmurs. Abdomen: Soft, non-tender, non-distended, BS + x 4.  Extremities: No pitting edema Accessory  Clinical Findings  CBC  Recent Labs  12/02/13 0415 12/03/13 0520  WBC 7.5 7.8  HGB 10.6* 10.7*  HCT 32.2* 33.3*  MCV 94.7 95.1  PLT 168 161   Basic Metabolic Panel  Recent Labs  12/01/13 0445 12/02/13 0900 12/03/13 0520  NA 137 134* 135  K 4.0 3.9 3.7  CL 101 97 97  CO2 29 29 30   GLUCOSE 110* 149* 115*  BUN 28* 25* 22  CREATININE 1.26 1.21 1.19  CALCIUM 9.0 9.2 9.0  MG 1.9  --   --   PHOS 2.8  --   --    Liver Function Tests No results found for this basename: AST, ALT, ALKPHOS, BILITOT, PROT, ALBUMIN,  in the last 72 hours No results found for this basename: LIPASE, AMYLASE,  in the last 72 hours Cardiac Enzymes No results found for this basename: CKTOTAL, CKMB, CKMBINDEX, TROPONINI,  in the last 72 hours BNP No components found with this basename: POCBNP,  D-Dimer No results found for this basename: DDIMER,  in the last 72 hours Hemoglobin A1C No results found for this basename: HGBA1C,  in the last 72 hours Fasting Lipid Panel No  results found for this basename: CHOL, HDL, LDLCALC, TRIG, CHOLHDL, LDLDIRECT,  in the last 72 hours Thyroid Function Tests No results found for this basename: TSH, T4TOTAL, FREET3, T3FREE, THYROIDAB,  in the last 72 hours  TELE Telemetry shows atrial fibrillation with controlled ventricular response  ECG    Radiology/Studies  Dg Chest Port 1 View  12/01/2013   CLINICAL DATA:  Pulmonary edema.  EXAM: PORTABLE CHEST - 1 VIEW  COMPARISON:  11/29/2013.  FINDINGS: Left IJ line in stable position. Right carotid stent noted. Persistent prominent cardiomegaly is present. Pulmonary venous congestion and bilateral interstitial and alveolar infiltrates are noted consistent with persistent congestive heart failure and pulmonary edema. Superimposed pneumonia in the right lung base cannot be excluded. Small right pleural effusion cannot be excluded. No pneumothorax. No acute osseous abnormality.  IMPRESSION: Persistent unchanged congestive  heart failure with pulmonary edema.   Electronically Signed   By: Maisie Fus  Register   On: 12/01/2013 07:40   Dg Chest Port 1 View  11/29/2013   CLINICAL DATA:  Assess edema.  EXAM: PORTABLE CHEST - 1 VIEW  COMPARISON:  11/28/2013  FINDINGS: Left jugular central line is near the junction of the superior vena cava and left innominate vein. Interstitial pulmonary edema has slightly improved. There continues to be increased densities at the right lung base. Heart size is mildly enlarged. Vascular stent on the right side of the neck.  IMPRESSION: Interstitial edema has minimally improved. There continues to be increased densities at the right lung base.   Electronically Signed   By: Richarda Overlie M.D.   On: 11/29/2013 07:41   Dg Chest Port 1 View  11/28/2013   CLINICAL DATA:  Shortness of breath.  EXAM: PORTABLE CHEST - 1 VIEW  COMPARISON:  11/27/2013.  FINDINGS: Left IJ catheter in good anatomic position. Right carotid stent. Cardiomegaly with pulmonary venous congestion and prominent bilateral pulmonary interstitial and alveolar infiltrates. Small right pleural effusion may be present. These findings are consistent with congestive heart failure with pulmonary edema. Superimposed pneumonia cannot be excluded. No pneumothorax. Degenerative changes both shoulders.  IMPRESSION: 1. Congestive heart failure with bilateral pulmonary edema. Small right pleural effusion may be present. Superimposed pneumonia cannot be excluded. 2. Left IJ catheter in stable position.  Right carotid stent.   Electronically Signed   By: Maisie Fus  Register   On: 11/28/2013 07:24   Dg Chest Port 1 View  11/27/2013   CLINICAL DATA:  Line placement  EXAM: PORTABLE CHEST - 1 VIEW  COMPARISON:  11/25/2013; 11/24/2013; 11/22/2013; 11/25/2010  FINDINGS: Grossly unchanged enlarged cardiac silhouette and mediastinal contours. Interval placement of left jugular approach central venous catheter with tip projected over the superior/ mid SVC. No  pneumothorax. Bibasilar heterogeneous slightly nodular opacities are grossly unchanged, right greater than left. The pulmonary vasculature appears slightly indistinct, in particular within the right upper and mid lung. No definite pleural effusion. Grossly unchanged bones.  IMPRESSION: 1. Interval placement of left jugular approach under venous catheter with tip projected over to the superior/mid SVC. No pneumothorax. 2. Persistent findings of bibasilar opacities, right greater than left, atelectasis versus infiltrate. 3. Cardiomegaly and possible mild asymmetric pulmonary edema, right greater than left.   Electronically Signed   By: Simonne Come M.D.   On: 11/27/2013 14:03   Dg Chest Port 1 View  11/25/2013   CLINICAL DATA:  Respiratory failure  EXAM: PORTABLE CHEST - 1 VIEW  COMPARISON:  11/24/2013  FINDINGS: Cardiac shadow is stable. The endotracheal tube and  nasogastric catheter been removed in the interval. Bibasilar atelectatic changes are noted. No focal confluent infiltrate is seen. No acute bony abnormality is noted.  IMPRESSION: Bibasilar atelectatic changes   Electronically Signed   By: Alcide Clever M.D.   On: 11/25/2013 07:29   Portable Chest Xray  11/24/2013   CLINICAL DATA:  Endotracheal tube placement.  EXAM: PORTABLE CHEST - 1 VIEW  COMPARISON:  Chest radiograph performed 11/23/2013  FINDINGS: The patient's endotracheal tube is seen ending 4 cm above the carina. Enteric tube is noted extending below the diaphragm.  Lung expansion is mildly decreased. Vascular congestion is noted, with bilateral central airspace opacities, raising concern for mild pulmonary edema. No definite pleural effusion or pneumothorax is seen.  The cardiomediastinal silhouette is mildly enlarged. Calcification is noted within the aortic arch. No acute osseous abnormalities are identified.  IMPRESSION: 1. Endotracheal tube seen ending 4 cm above the carina. 2. Vascular congestion and mild cardiomegaly, with bilateral  central airspace opacities, raising concern for mild pulmonary edema. Previously noted right basilar opacity has improved.   Electronically Signed   By: Roanna Raider M.D.   On: 11/24/2013 02:34   Dg Chest Portable 1 View  11/23/2013   CLINICAL DATA:  Shortness of breath; preoperative chest radiograph.  EXAM: PORTABLE CHEST - 1 VIEW  COMPARISON:  Chest radiograph performed 11/22/2013  FINDINGS: The lungs are well-aerated. Mild bibasilar airspace opacities are slightly worsened from the prior study and may reflect atelectasis, mild pneumonia or possibly mild pulmonary edema, given underlying vascular congestion. No pleural effusion or pneumothorax is seen.  The cardiomediastinal silhouette is mildly enlarged. No acute osseous abnormalities are seen.  IMPRESSION: Mild bibasilar airspace opacities are slightly worsened from the recent prior study and may reflect atelectasis, mild pneumonia or possibly mild pulmonary edema, given underlying vascular congestion and mild cardiomegaly.   Electronically Signed   By: Roanna Raider M.D.   On: 11/23/2013 21:51   Dg Abd Portable 1v  11/30/2013   CLINICAL DATA:  Abdominal distention.  Diabetes.  EXAM: PORTABLE ABDOMEN - 1 VIEW  COMPARISON:  Lumbar spine series 10 19 2009.  FINDINGS: Moderate gastric distention noted. Paucity of gas is noted distally. Gastric outlet obstruction cannot be excluded. Calcific density noted over the left kidney suggesting left nephrolithiasis. Degenerative changes lumbar spine and both hips.  IMPRESSION: Cannot exclude gastric outlet obstruction. These results will be called to the ordering clinician or representative by the Radiologist Assistant, and communication documented in the PACS Dashboard.   Electronically Signed   By: Maisie Fus  Register   On: 11/30/2013 12:11    ASSESSMENT AND PLAN 1. status post embolectomy to right leg  2. atrial fibrillation, INR is therapeutic at 2.61 3. prior CVA  4. chronic diastolic CHF   Plan:  Continue diuresis.  Weight today not yet recorded. Renal function is stable.    Signed, Cassell Clement  MD

## 2013-12-03 NOTE — Progress Notes (Signed)
Foley cath d/c'd per protocol.  

## 2013-12-03 NOTE — Progress Notes (Signed)
Pt. Has not void for 8 hours bladder scan showed 180 cc paged doctor she ordered 250 bolus. Call back if he has not had any out in 8 hours

## 2013-12-03 NOTE — Progress Notes (Signed)
[facility] Progress Note [place] TEAM 1 - Stepdown/ICU TEAM   [name] [name] ZOX:[id number] DOB: January 06, 1931 DOA: 11/23/2013 PCP: No primary provider on file.  Brief narrative: 79 y/o male with h/o HTN, COPD, NIRDM, A-fib only on ASA 81 mg, PVD. Pt was transferred from [facility] for sudden onset of weakness and discoloration of right lower extremity. He initially underwent a stroke work up at [facility] which was essentially negative. An arterial doppler reveal poor flow in right lower extremity and no flow in right common femoral and SFA. He underwent a right femoral embolectomy and angioplasty of right femoral artery on 12/12 and remained intubated post op. Post op course complicated by A-fib with RVR and hypotension- started on Amiodarone on 12/13.   Assessment/Plan: Principal Problem:   Right Leg Ischemia - embolic event  - s/p right femoral embolectomy and angioplasty - management per vascular surgery  Active Problems:    Atrial fibrillation - CHADSVAsc is 6 - cont Amiodarone - d/c heparin now that INR is therapeutic on Coumadin - Possible plans to switch to NOAC per cardiology     Acute respiratory failure with hypoxia Now extubated - see below- receiving diuretics     Acute pulmonary edema - CXR  revealed continued edema however- given IV lasix and now on oral Lasix per cards  Frequent loose stools - was severely constipated and started on bowel regimen while in ICU- will d/c all laxatives for now    Hypotension-  - also improved- currently receiving Metoprolol to assist in rate control    H/O: CVA (cerebrovascular accident)    DM type 2 - cont current sliding scale - A1c 5.5   Code Status: Full code  Family Communication: with wife Disposition Plan: SNF  Consultants: PCCM Vascular surgery Cardiology  Procedures: 12/12 Right femoral embolectomy. Bovine pericardial patch angioplasty of the right femoral artery  12/12 2D echo>>>EF 60-65%,  calcified aortic valve  ETT 12/11 >> 12/12  Antibiotics: Cefuroxime (peri-op) 12/11 >> 12/12   DVT prophylaxis: Warfarin/ Heparin  HPI/Subjective: Patient alert- having frequent stools- no other complaints.    Objective: Blood pressure 91/55, pulse 76, temperature 97.5 F (36.4 C), temperature source Oral, resp. rate 20, height 5\' 7"  (1.702 m), weight 98 kg (216 lb 0.8 oz), SpO2 95.00%.  Intake/Output Summary (Last 24 hours) at 12/03/13 1734 Last data filed at 12/03/13 1000  Gross per 24 hour  Intake      0 ml  Output    925 ml  Net   -925 ml     Exam: General: No acute respiratory distress Lungs: Clear to auscultation bilaterally without wheezes or crackles Cardiovascular: IIRR, 2/6 murmur at left lower sternal border Abdomen: Nontender, nondistended, soft, bowel sounds positive, no rebound, no ascites, no appreciable mass Extremities: No significant cyanosis, clubbing, - b/l pitting edema R>L  Data Reviewed: Basic Metabolic Panel:  Recent Labs Lab 11/28/13 0400 11/29/13 0410 11/30/13 0415 12/01/13 0445 12/02/13 0900 12/03/13 0520  NA 136 137 137 137 134* 135  K 4.1 4.0 4.0 4.0 3.9 3.7  CL 102 101 101 101 97 97  CO2 26 27 28 29 29 30   GLUCOSE 160* 159* 135* 110* 149* 115*  BUN 26* 24* 29* 28* 25* 22  CREATININE 1.22 1.28 1.32 1.26 1.21 1.19  CALCIUM 8.9 8.9 9.1 9.0 9.2 9.0  MG  --  1.9 1.9 1.9  --   --   PHOS  --  1.7* 2.3 2.8  --   --  Liver Function Tests:  Recent Labs Lab 11/28/13 0400  AST 46*  ALT 25  ALKPHOS 74  BILITOT 1.2  PROT 6.1  ALBUMIN 2.8*   No results found for this basename: LIPASE, AMYLASE,  in the last 168 hours No results found for this basename: AMMONIA,  in the last 168 hours CBC:  Recent Labs Lab 11/27/13 0940 11/27/13 1300  11/29/13 0410 11/30/13 0415 12/01/13 0445 12/02/13 0415 12/03/13 0520  WBC 7.4 7.5  < > 6.4 6.4 6.8 7.5 7.8  NEUTROABS  --  5.9  --   --   --   --   --   --   HGB 10.8* 11.1*  < > 10.0*  10.0* 10.1* 10.6* 10.7*  HCT 33.4* 33.6*  < > 30.5* 30.6* 31.2* 32.2* 33.3*  MCV 95.4 95.5  < > 94.7 94.4 95.4 94.7 95.1  PLT 127* 136*  < > 123* 131* 145* 168 161  < > = values in this interval not displayed. Cardiac Enzymes: No results found for this basename: CKTOTAL, CKMB, CKMBINDEX, TROPONINI,  in the last 168 hours BNP (last 3 results) No results found for this basename: PROBNP,  in the last 8760 hours CBG:  Recent Labs Lab 12/02/13 1551 12/02/13 2333 12/03/13 0813 12/03/13 1241 12/03/13 1625  GLUCAP 152* 112* 123* 109* 170*    Recent Results (from the past 240 hour(s))  MRSA PCR SCREENING     Status: None   Collection Time    11/24/13  1:39 AM      Result Value Range Status   MRSA by PCR NEGATIVE  NEGATIVE Final   Comment:            The GeneXpert MRSA Assay (FDA     approved for NASAL specimens     only), is one component of a     comprehensive MRSA colonization     surveillance program. It is not     intended to diagnose MRSA     infection nor to guide or     monitor treatment for     MRSA infections.  URINE CULTURE     Status: None   Collection Time    11/27/13  1:48 PM      Result Value Range Status   Specimen Description URINE, CATHETERIZED   Final   Special Requests NONE   Final   Culture  Setup Time     Final   Value: 11/27/2013 15:20     Performed at [facility] Count     Final   Value: NO GROWTH     Performed at [facility]   Culture     Final   Value: NO GROWTH     Performed at [facility]   Report Status 11/28/2013 FINAL   Final     Studies:  Recent x-ray studies have been reviewed in detail by the Attending Physician  Scheduled Meds:  Scheduled Meds: . amiodarone  400 mg Oral BID  . antiseptic oral rinse  15 mL Mouth Rinse QID  . antiseptic oral rinse  15 mL Mouth Rinse BID  . famotidine  20 mg Oral BID  . furosemide  40 mg Oral Daily  . guaiFENesin  600 mg Oral BID  . insulin aspart  0-15 Units  Subcutaneous TID WC  . ipratropium  0.5 mg Nebulization Q6H WA  . levalbuterol  0.63 mg Nebulization Q6H WA  . metoprolol tartrate  50 mg Oral Q8H  .  terazosin  5 mg Oral QODAY  . Warfarin - Pharmacist Dosing Inpatient   Does not apply q1800   Continuous Infusions:    Time spent on care of this patient: 35 min   Calvert Cantor, MD  [facility] Office  [phone] Pager - Text Page per Loretha Stapler as per below:  On-Call/Text Page:      [email or web address]      password [email or web address]  If 7PM-7AM, please contact night-coverage [email or web address] Password [facility] 12/03/2013, 5:34 PM   LOS: 10 days

## 2013-12-03 NOTE — Progress Notes (Signed)
Patient ID: [name] [name], male   DOB: [birth year], 79 y.o.   MRN: [id number] Comfortable this morning with no complaints. Right groin healing with no evidence of infection Right foot well-perfused with no critical ischemia. Stable from vascular standpoint

## 2013-12-04 ENCOUNTER — Telehealth: Payer: Self-pay | Admitting: Vascular Surgery

## 2013-12-04 DIAGNOSIS — E118 Type 2 diabetes mellitus with unspecified complications: Secondary | ICD-10-CM

## 2013-12-04 DIAGNOSIS — Z8673 Personal history of transient ischemic attack (TIA), and cerebral infarction without residual deficits: Secondary | ICD-10-CM

## 2013-12-04 LAB — BASIC METABOLIC PANEL
Creatinine, Ser: 1.23 mg/dL (ref 0.50–1.35)
GFR calc Af Amer: 62 mL/min — ABNORMAL LOW (ref >90)
GFR calc non Af Amer: 54 mL/min — ABNORMAL LOW (ref >90)
Glucose, Bld: 105 mg/dL — ABNORMAL HIGH (ref 70–99)
Potassium: 3.6 mEq/L (ref 3.5–5.1)
Sodium: 133 mEq/L — ABNORMAL LOW (ref 135–145)

## 2013-12-04 LAB — CBC
Hemoglobin: 10 g/dL — ABNORMAL LOW (ref 13.0–17.0)
MCH: 30.3 pg (ref 26.0–34.0)
MCV: 94.8 fL (ref 78.0–100.0)
RBC: 3.3 MIL/uL — ABNORMAL LOW (ref 4.22–5.81)
WBC: 5.8 10*3/uL (ref 4.0–10.5)

## 2013-12-04 LAB — GLUCOSE, CAPILLARY
Glucose-Capillary: 131 mg/dL — ABNORMAL HIGH (ref 70–99)
Glucose-Capillary: 178 mg/dL — ABNORMAL HIGH (ref 70–99)

## 2013-12-04 LAB — PROTIME-INR: INR: 2.68 — ABNORMAL HIGH (ref 0.00–1.49)

## 2013-12-04 MED ORDER — ALBUTEROL SULFATE (5 MG/ML) 0.5% IN NEBU: 2.5000 mg | INHALATION_SOLUTION | Freq: Four times a day (QID) | RESPIRATORY_TRACT | Status: DC | PRN

## 2013-12-04 MED ORDER — AMIODARONE HCL 200 MG PO TABS
200.0000 mg | ORAL_TABLET | Freq: Every day | ORAL | Status: DC
  Filled 2013-12-04 (×2): qty 1

## 2013-12-04 MED ORDER — WARFARIN SODIUM 1 MG PO TABS
1.0000 mg | ORAL_TABLET | Freq: Once | ORAL | Status: AC
  Administered 2013-12-04: 1 mg via ORAL
  Filled 2013-12-04: qty 1

## 2013-12-04 MED ORDER — METOPROLOL TARTRATE 50 MG PO TABS
50.0000 mg | ORAL_TABLET | Freq: Two times a day (BID) | ORAL | Status: DC
  Administered 2013-12-04: 50 mg via ORAL
  Filled 2013-12-04 (×3): qty 1

## 2013-12-04 NOTE — Progress Notes (Signed)
Physical Therapy Treatment Patient Details Name: [name] [name] MRN: [id number] DOB: [birth year] Today's Date: 12/04/2013 Time: 4098-1191 PT Time Calculation (min): 35 min  PT Assessment / Plan / Recommendation  History of Present Illness Pt transferred from [facility] for R LE embolectomy. Remained on vent 12/11-12/12; post-op hypotension requiring pressors.   PT Comments   Patient remains unable to stand erect and weakness limits standing tolerance.  Was able to stand with +1 assist today, still unsafe for out of bed transfers without assist of 2.  Will benefit from SNF level rehab for longer length of stay to maximize mobility prior to d/c home.  Follow Up Recommendations  SNF     Does the patient have the potential to tolerate intense rehabilitation   N/A  Barriers to Discharge  None      Equipment Recommendations  None recommended by PT    Recommendations for Other Services  None  Frequency Min 3X/week   Progress towards PT Goals Progress towards PT goals: Progressing toward goals  Plan Discharge plan needs to be updated    Precautions / Restrictions Precautions Precautions: Fall   Pertinent Vitals/Pain C/o soreness right LE    Mobility  Bed Mobility Supine to Sit: 2: Max assist Sitting - Scoot to Edge of Bed: 2: Max assist Sit to Supine: 1: +1 Total assist Details for Bed Mobility Assistance: pt required assist for both LE management and trunk elevation.  Transfers Transfers: Stand Pivot Transfers Sit to Stand: From bed;3: Mod assist;From elevated surface;With upper extremity assist Stand to Sit: To bed;3: Mod assist;With upper extremity assist Stand Pivot Transfers: 1: +2 Total assist Stand Pivot Transfers: Patient Percentage: 40% Details for Transfer Assistance: assist to stand from bed to walker with cues for trunk elevation and knee extension, unable to fulliy extend knees and maintain standing more than few seconds; pivot to chair with assist from RN  without walker.    Exercises General Exercises - Upper Extremity Shoulder Flexion: AROM;Both;10 reps;Supine Elbow Flexion: AROM;Both;10 reps;Supine General Exercises - Lower Extremity Ankle Circles/Pumps: AROM;Both;10 reps;Supine Short Arc Quad: AROM;Both;10 reps;Supine Heel Slides: AROM;Right;AAROM;Left;10 reps;Supine    PT Goals (current goals can now be found in the care plan section)    Visit Information  Last PT Received On: 12/04/13 Assistance Needed: +2 History of Present Illness: Pt transferred from [facility] for R LE embolectomy. Remained on vent 12/11-12/12; post-op hypotension requiring pressors.    Subjective Data      Cognition  Cognition Arousal/Alertness: Awake/alert Behavior During Therapy: WFL for tasks assessed/performed Overall Cognitive Status: Within Functional Limits for tasks assessed    Balance  Static Sitting Balance Static Sitting - Balance Support: Feet supported;Right upper extremity supported Static Sitting - Level of Assistance: 4: Min assist;3: Mod assist Static Sitting - Comment/# of Minutes: leans back and to left sat approx 6-7 minutes  End of Session PT - End of Session Equipment Utilized During Treatment: Gait belt Activity Tolerance: Patient limited by fatigue Patient left: in chair;with call bell/phone within reach Nurse Communication: Mobility status   GP     [facility] 12/04/2013, 1:34 PM [place], PT [phone] 12/04/2013

## 2013-12-04 NOTE — Progress Notes (Signed)
Patient Name: [name] [name] Date of Encounter: 12/04/2013     Principal Problem:   Ischemic leg Active Problems:   Atrial fibrillation   Acute respiratory failure with hypoxia   Hypotension   H/O: CVA (cerebrovascular accident)   COPD (chronic obstructive pulmonary disease)   Acute pulmonary edema   DM type 2, controlled, with complication    SUBJECTIVE  No chest pain.  Mild dyspnea. On nasal oxygen. States he has oxygen at home.  CURRENT MEDS . amiodarone  400 mg Oral BID  . antiseptic oral rinse  15 mL Mouth Rinse QID  . antiseptic oral rinse  15 mL Mouth Rinse BID  . famotidine  20 mg Oral BID  . guaiFENesin  600 mg Oral BID  . insulin aspart  0-15 Units Subcutaneous TID WC  . metoprolol tartrate  50 mg Oral Q8H  . terazosin  5 mg Oral QODAY  . Warfarin - Pharmacist Dosing Inpatient   Does not apply q1800    OBJECTIVE  Filed Vitals:   12/03/13 2011 12/04/13 0353 12/04/13 0745 12/04/13 0829  BP: 94/43 111/68    Pulse: 66 84 75   Temp: 97.6 F (36.4 C) 97.4 F (36.3 C)    TempSrc: Oral Oral    Resp: 18 18 18    Height:      Weight:      SpO2: 95% 94% 97% 97%    Intake/Output Summary (Last 24 hours) at 12/04/13 1007 Last data filed at 12/04/13 0354  Gross per 24 hour  Intake      0 ml  Output    900 ml  Net   -900 ml   Filed Weights   11/30/13 0600 12/01/13 0417 12/02/13 0412  Weight: 219 lb 5.7 oz (99.5 kg) 218 lb 0.6 oz (98.9 kg) 216 lb 0.8 oz (98 kg)    PHYSICAL EXAM  General: Pleasant, NAD. Neuro: Alert and oriented X 3. Moves all extremities spontaneously. Psych: Normal affect. HEENT:  Normal  Neck: Supple without bruits or JVD. Lungs:  Mild basilar rales Heart: Irregular rhythm no s3, s4, or murmurs. Abdomen: Soft, non-tender, non-distended, BS + x 4.  Extremities: No pitting edema Accessory Clinical Findings  CBC  Recent Labs  12/03/13 0520 12/04/13 0445  WBC 7.8 5.8  HGB 10.7* 10.0*  HCT 33.3* 31.3*  MCV 95.1 94.8  PLT 161  149*   Basic Metabolic Panel  Recent Labs  12/03/13 0520 12/04/13 0445  NA 135 133*  K 3.7 3.6  CL 97 97  CO2 30 28  GLUCOSE 115* 105*  BUN 22 22  CREATININE 1.19 1.23  CALCIUM 9.0 8.9   Liver Function Tests No results found for this basename: AST, ALT, ALKPHOS, BILITOT, PROT, ALBUMIN,  in the last 72 hours No results found for this basename: LIPASE, AMYLASE,  in the last 72 hours Cardiac Enzymes No results found for this basename: CKTOTAL, CKMB, CKMBINDEX, TROPONINI,  in the last 72 hours BNP No components found with this basename: POCBNP,  D-Dimer No results found for this basename: DDIMER,  in the last 72 hours Hemoglobin A1C No results found for this basename: HGBA1C,  in the last 72 hours Fasting Lipid Panel No results found for this basename: CHOL, HDL, LDLCALC, TRIG, CHOLHDL, LDLDIRECT,  in the last 72 hours Thyroid Function Tests No results found for this basename: TSH, T4TOTAL, FREET3, T3FREE, THYROIDAB,  in the last 72 hours  TELE Telemetry shows atrial fibrillation with controlled ventricular response  ECG  Radiology/Studies  Dg Chest Port 1 View  12/01/2013   CLINICAL DATA:  Pulmonary edema.  EXAM: PORTABLE CHEST - 1 VIEW  COMPARISON:  11/29/2013.  FINDINGS: Left IJ line in stable position. Right carotid stent noted. Persistent prominent cardiomegaly is present. Pulmonary venous congestion and bilateral interstitial and alveolar infiltrates are noted consistent with persistent congestive heart failure and pulmonary edema. Superimposed pneumonia in the right lung base cannot be excluded. Small right pleural effusion cannot be excluded. No pneumothorax. No acute osseous abnormality.  IMPRESSION: Persistent unchanged congestive heart failure with pulmonary edema.   Electronically Signed   By: Maisie Fus  Register   On: 12/01/2013 07:40   Dg Chest Port 1 View  11/29/2013   CLINICAL DATA:  Assess edema.  EXAM: PORTABLE CHEST - 1 VIEW  COMPARISON:  11/28/2013   FINDINGS: Left jugular central line is near the junction of the superior vena cava and left innominate vein. Interstitial pulmonary edema has slightly improved. There continues to be increased densities at the right lung base. Heart size is mildly enlarged. Vascular stent on the right side of the neck.  IMPRESSION: Interstitial edema has minimally improved. There continues to be increased densities at the right lung base.   Electronically Signed   By: Richarda Overlie M.D.   On: 11/29/2013 07:41   Dg Chest Port 1 View  11/28/2013   CLINICAL DATA:  Shortness of breath.  EXAM: PORTABLE CHEST - 1 VIEW  COMPARISON:  11/27/2013.  FINDINGS: Left IJ catheter in good anatomic position. Right carotid stent. Cardiomegaly with pulmonary venous congestion and prominent bilateral pulmonary interstitial and alveolar infiltrates. Small right pleural effusion may be present. These findings are consistent with congestive heart failure with pulmonary edema. Superimposed pneumonia cannot be excluded. No pneumothorax. Degenerative changes both shoulders.  IMPRESSION: 1. Congestive heart failure with bilateral pulmonary edema. Small right pleural effusion may be present. Superimposed pneumonia cannot be excluded. 2. Left IJ catheter in stable position.  Right carotid stent.   Electronically Signed   By: Maisie Fus  Register   On: 11/28/2013 07:24   Dg Chest Port 1 View  11/27/2013   CLINICAL DATA:  Line placement  EXAM: PORTABLE CHEST - 1 VIEW  COMPARISON:  11/25/2013; 11/24/2013; 11/22/2013; 11/25/2010  FINDINGS: Grossly unchanged enlarged cardiac silhouette and mediastinal contours. Interval placement of left jugular approach central venous catheter with tip projected over the superior/ mid SVC. No pneumothorax. Bibasilar heterogeneous slightly nodular opacities are grossly unchanged, right greater than left. The pulmonary vasculature appears slightly indistinct, in particular within the right upper and mid lung. No definite pleural  effusion. Grossly unchanged bones.  IMPRESSION: 1. Interval placement of left jugular approach under venous catheter with tip projected over to the superior/mid SVC. No pneumothorax. 2. Persistent findings of bibasilar opacities, right greater than left, atelectasis versus infiltrate. 3. Cardiomegaly and possible mild asymmetric pulmonary edema, right greater than left.   Electronically Signed   By: Simonne Come M.D.   On: 11/27/2013 14:03   Dg Chest Port 1 View  11/25/2013   CLINICAL DATA:  Respiratory failure  EXAM: PORTABLE CHEST - 1 VIEW  COMPARISON:  11/24/2013  FINDINGS: Cardiac shadow is stable. The endotracheal tube and nasogastric catheter been removed in the interval. Bibasilar atelectatic changes are noted. No focal confluent infiltrate is seen. No acute bony abnormality is noted.  IMPRESSION: Bibasilar atelectatic changes   Electronically Signed   By: Alcide Clever M.D.   On: 11/25/2013 07:29   Portable Chest Xray  11/24/2013  CLINICAL DATA:  Endotracheal tube placement.  EXAM: PORTABLE CHEST - 1 VIEW  COMPARISON:  Chest radiograph performed 11/23/2013  FINDINGS: The patient's endotracheal tube is seen ending 4 cm above the carina. Enteric tube is noted extending below the diaphragm.  Lung expansion is mildly decreased. Vascular congestion is noted, with bilateral central airspace opacities, raising concern for mild pulmonary edema. No definite pleural effusion or pneumothorax is seen.  The cardiomediastinal silhouette is mildly enlarged. Calcification is noted within the aortic arch. No acute osseous abnormalities are identified.  IMPRESSION: 1. Endotracheal tube seen ending 4 cm above the carina. 2. Vascular congestion and mild cardiomegaly, with bilateral central airspace opacities, raising concern for mild pulmonary edema. Previously noted right basilar opacity has improved.   Electronically Signed   By: Roanna Raider M.D.   On: 11/24/2013 02:34   Dg Chest Portable 1 View  11/23/2013    CLINICAL DATA:  Shortness of breath; preoperative chest radiograph.  EXAM: PORTABLE CHEST - 1 VIEW  COMPARISON:  Chest radiograph performed 11/22/2013  FINDINGS: The lungs are well-aerated. Mild bibasilar airspace opacities are slightly worsened from the prior study and may reflect atelectasis, mild pneumonia or possibly mild pulmonary edema, given underlying vascular congestion. No pleural effusion or pneumothorax is seen.  The cardiomediastinal silhouette is mildly enlarged. No acute osseous abnormalities are seen.  IMPRESSION: Mild bibasilar airspace opacities are slightly worsened from the recent prior study and may reflect atelectasis, mild pneumonia or possibly mild pulmonary edema, given underlying vascular congestion and mild cardiomegaly.   Electronically Signed   By: Roanna Raider M.D.   On: 11/23/2013 21:51   Dg Abd Portable 1v  11/30/2013   CLINICAL DATA:  Abdominal distention.  Diabetes.  EXAM: PORTABLE ABDOMEN - 1 VIEW  COMPARISON:  Lumbar spine series 10 19 2009.  FINDINGS: Moderate gastric distention noted. Paucity of gas is noted distally. Gastric outlet obstruction cannot be excluded. Calcific density noted over the left kidney suggesting left nephrolithiasis. Degenerative changes lumbar spine and both hips.  IMPRESSION: Cannot exclude gastric outlet obstruction. These results will be called to the ordering clinician or representative by the Radiologist Assistant, and communication documented in the PACS Dashboard.   Electronically Signed   By: Maisie Fus  Register   On: 11/30/2013 12:11    ASSESSMENT AND PLAN 1. status post embolectomy to right leg  2. atrial fibrillation, INR is therapeutic. 3. prior CVA  4. chronic diastolic CHF   Plan: Heart rate is now adequately controlled. Will reduce doses of metoprolol and amiodarone. Since his atrial fibrillation is chronic he may not need long term amiodarone.    Signed, Cassell Clement  MD

## 2013-12-04 NOTE — Progress Notes (Addendum)
[facility] [place]  Subjective  - Feeling well this morning eating breakfast.    Objective 111/68 84 97.4 F (36.3 C) (Oral) 18 94%  Intake/Output Summary (Last 24 hours) at 12/04/13 0751 Last data filed at 12/04/13 0354  Gross per 24 hour  Intake      0 ml  Output   1150 ml  Net  -1150 ml   Right groin clean dry and soft.  No hematoma. Right foot well perfused, warm to touch  Assessment/Planning: PROCEDURE:  1. Right femoral embolectomy  2. Bovine pericardial patch angioplasty of the right femoral artery F/U with Dr. Edilia Bo in 4 weeks  Thomasena Edis [facility] 12/04/2013 7:51 AM  COAG Lab Results  Component Value Date   INR 2.68* 12/04/2013   INR 2.61* 12/03/2013   INR 2.00* 12/02/2013   Agree with above. Incision looks fine. Right foot warm and well-perfused. Doing well from vascular standpoint. To skilled nursing facility when medically stable.  Waverly Ferrari, MD, FACS [facility] [phone] 12/04/2013

## 2013-12-04 NOTE — Progress Notes (Signed)
ANTICOAGULATION CONSULT NOTE - Follow Up Consult  Pharmacy Consult for Coumadin Indication:  Afib  No Known Allergies  Labs:  Recent Labs  12/02/13 0415 12/02/13 0900 12/03/13 0520 12/04/13 0445  HGB 10.6*  --  10.7* 10.0*  HCT 32.2*  --  33.3* 31.3*  PLT 168  --  161 149*  LABPROT 22.1*  --  27.0* 27.6*  INR 2.00*  --  2.61* 2.68*  HEPARINUNFRC 0.48  --   --   --   CREATININE  --  1.21 1.19 1.23    Estimated Creatinine Clearance: 53.5 ml/min (by C-G formula based on Cr of 1.23).   Medical History: Past Medical History  Diagnosis Date  . Hypertension   . Stroke   . COPD (chronic obstructive pulmonary disease)   . Diabetes mellitus without complication   . GERD (gastroesophageal reflux disease)   . A-fib     Assessment: Pt s/p embolectomy for R femoral DVT also has h/o AFib, CHADS2 = 4. H/o stroke 1997.  H/H stable, Plt trended down 161K>>149K INR = 2.68 (slight trend up despite holding coumadin yesterday) Amiodarone dose decreased to 200 mg daily. Expect INR to trend down tomorrow.  Goal of Therapy:   INR 2-3 Monitor platelets by anticoagulation protocol: Yes   Plan:  Coumadin 1 mg x 1 dose tonight Daily CBC,  PT/INR  Vinnie Level, PharmD.  Clinical Pharmacist Pager [phone]

## 2013-12-04 NOTE — Progress Notes (Signed)
Randall Reidler PA paged and informed patient bladder scan now showing 396 cc urin in bladder. ( Foley was d/c'ed around 10 am and patient has not voided.) Bladder scan done around 1840 pm showed 180 cc in bladder. Patient was given 250 cc bolus NS. Patient states he has had no urge to urinate. Order given to replace foley cath. Will continue to monitor.

## 2013-12-04 NOTE — Telephone Encounter (Addendum)
Message copied by Fredrich Birks on Mon Dec 04, 2013  2:30 PM ------      Message from: [place], [place] M      Created: Mon Dec 04, 2013  7:55 AM       F/U with Dr. Edilia Bo s/p PROCEDURE:        1. Right femoral embolectomy      2. Bovine pericardial patch angioplasty of the right femoral artery      F/U in 4 weeks       ------  12/04/13: home # is disconnected, work # does not have vm option, mailed letter. Pt is still admitted, so hopefully an AVS will be printed prior to pt being d/c'd.

## 2013-12-04 NOTE — Progress Notes (Signed)
[facility] Progress Note   [name] [name] UEA:[id number] DOB: Apr 22, 1931 DOA: 11/23/2013 PCP: No primary provider on file.  Assessment/Plan:    Right Leg Ischemia - embolic event  - s/p right femoral embolectomy and angioplasty - management per vascular surgery    Atrial fibrillation - CHADSVA score is 6 - cont Amiodarone and metoprolol dose to be adjusted per cardiology -on coumadin adn therapeutic now    Acute respiratory failure with hypoxia Resolved Good O2 sat on chronic oxygen use Continue diuretics    Acute pulmonary edema - last CXR  revealed vasc congestion -continue lasix  Frequent loose stools - was severely constipated and started on bowel regimen while in ICU -no further diarrhea    Hypotension-  - also improved- currently receiving Metoprolol to assist in rate control -dose to be adjusted by cardiology    H/O: CVA (cerebrovascular accident) Now on coumadin    DM type 2 - cont current sliding scale - A1c 5.5    Urinary retention -foley replaced. Will need urology follow up in outpatient for voiding trials and urodynamics  Code Status: Full code  Family Communication: with wife Disposition Plan: SNF  Consultants: PCCM Vascular surgery Cardiology  Procedures: 12/12 Right femoral embolectomy. Bovine pericardial patch angioplasty of the right femoral artery  12/12 2D echo>>>EF 60-65%, calcified aortic valve  ETT 12/11 >> 12/12  Antibiotics: Cefuroxime (peri-op) 12/11 >> 12/12   DVT prophylaxis: Warfarin/ Heparin  HPI/Subjective: Patient denies CP, SOB or any other complaints. Unable void overnight required foley to be replaced  Objective: Blood pressure 110/64, pulse 116, temperature 97.6 F (36.4 C), temperature source Oral, resp. rate 18, height 5\' 7"  (1.702 m), weight 98 kg (216 lb 0.8 oz), SpO2 97.00%.  Intake/Output Summary (Last 24 hours) at 12/04/13 1905 Last data filed at 12/04/13 1803  Gross per 24 hour  Intake    840  ml  Output   1300 ml  Net   -460 ml     Exam: General: No acute respiratory distress Lungs: Clear to auscultation bilaterally without wheezes or crackles Cardiovascular: IIRR, 2/6 murmur at left lower sternal border Abdomen: Nontender, nondistended, soft, bowel sounds positive, no rebound, no ascites, no appreciable mass Extremities: No significant cyanosis, clubbing, - b/l pitting edema R>L  Data Reviewed: Basic Metabolic Panel:  Recent Labs Lab 11/28/13 0400 11/29/13 0410 11/30/13 0415 12/01/13 0445 12/02/13 0900 12/03/13 0520 12/04/13 0445  NA 136 137 137 137 134* 135 133*  K 4.1 4.0 4.0 4.0 3.9 3.7 3.6  CL 102 101 101 101 97 97 97  CO2 26 27 28 29 29 30 28   GLUCOSE 160* 159* 135* 110* 149* 115* 105*  BUN 26* 24* 29* 28* 25* 22 22  CREATININE 1.22 1.28 1.32 1.26 1.21 1.19 1.23  CALCIUM 8.9 8.9 9.1 9.0 9.2 9.0 8.9  MG  --  1.9 1.9 1.9  --   --   --   PHOS  --  1.7* 2.3 2.8  --   --   --    Liver Function Tests:  Recent Labs Lab 11/28/13 0400  AST 46*  ALT 25  ALKPHOS 74  BILITOT 1.2  PROT 6.1  ALBUMIN 2.8*   CBC:  Recent Labs Lab 11/30/13 0415 12/01/13 0445 12/02/13 0415 12/03/13 0520 12/04/13 0445  WBC 6.4 6.8 7.5 7.8 5.8  HGB 10.0* 10.1* 10.6* 10.7* 10.0*  HCT 30.6* 31.2* 32.2* 33.3* 31.3*  MCV 94.4 95.4 94.7 95.1 94.8  PLT 131* 145* 168 161 149*  Cardiac Enzymes: No results found for this basename: CKTOTAL, CKMB, CKMBINDEX, TROPONINI,  in the last 168 hours BNP (last 3 results) No results found for this basename: PROBNP,  in the last 8760 hours CBG:  Recent Labs Lab 12/03/13 1625 12/03/13 2057 12/04/13 0622 12/04/13 1109 12/04/13 1609  GLUCAP 170* 125* 131* 178* 131*    Recent Results (from the past 240 hour(s))  URINE CULTURE     Status: None   Collection Time    11/27/13  1:48 PM      Result Value Range Status   Specimen Description URINE, CATHETERIZED   Final   Special Requests NONE   Final   Culture  Setup Time     Final    Value: 11/27/2013 15:20     Performed at [facility] Count     Final   Value: NO GROWTH     Performed at [facility]   Culture     Final   Value: NO GROWTH     Performed at [facility]   Report Status 11/28/2013 FINAL   Final     Studies:  Recent x-ray studies have been reviewed in detail by the Attending Physician  Scheduled Meds:  Scheduled Meds: . amiodarone  200 mg Oral Daily  . antiseptic oral rinse  15 mL Mouth Rinse QID  . antiseptic oral rinse  15 mL Mouth Rinse BID  . famotidine  20 mg Oral BID  . guaiFENesin  600 mg Oral BID  . insulin aspart  0-15 Units Subcutaneous TID WC  . metoprolol tartrate  50 mg Oral BID  . terazosin  5 mg Oral QODAY  . Warfarin - Pharmacist Dosing Inpatient   Does not apply q1800   Continuous Infusions:    Time spent on care of this patient: 35 min   Satoya Feeley, MD [phone]  If 7PM-7AM, please contact night-coverage [email or web address] Password [email or web address] 12/04/2013, 7:05 PM   LOS: 11 days

## 2013-12-04 NOTE — Progress Notes (Addendum)
1:40 pm: CSW sent updated clinicals to [facility]. CSW continuing to wait for insurance auth for SNF. Anticipate insurance authorization tomorrow  CSW spoke to [facility] and [facility]. CSW awaiting insurance authorization at this time.   Maree Krabbe, MSW, [facility] [phone]

## 2013-12-05 DIAGNOSIS — I5032 Chronic diastolic (congestive) heart failure: Secondary | ICD-10-CM

## 2013-12-05 DIAGNOSIS — I509 Heart failure, unspecified: Secondary | ICD-10-CM

## 2013-12-05 LAB — BASIC METABOLIC PANEL
CO2: 31 mEq/L (ref 19–32)
Calcium: 8.9 mg/dL (ref 8.4–10.5)
Chloride: 100 mEq/L (ref 96–112)
Creatinine, Ser: 1.14 mg/dL (ref 0.50–1.35)
GFR calc Af Amer: 68 mL/min — ABNORMAL LOW (ref >90)
Glucose, Bld: 128 mg/dL — ABNORMAL HIGH (ref 70–99)
Sodium: 138 mEq/L (ref 135–145)

## 2013-12-05 LAB — GLUCOSE, CAPILLARY
Glucose-Capillary: 120 mg/dL — ABNORMAL HIGH (ref 70–99)
Glucose-Capillary: 221 mg/dL — ABNORMAL HIGH (ref 70–99)

## 2013-12-05 LAB — PROTIME-INR: Prothrombin Time: 19.6 seconds — ABNORMAL HIGH (ref 11.6–15.2)

## 2013-12-05 MED ORDER — FUROSEMIDE 40 MG PO TABS
40.0000 mg | ORAL_TABLET | Freq: Every day | ORAL | Status: DC
  Administered 2013-12-05: 40 mg via ORAL
  Filled 2013-12-05: qty 1

## 2013-12-05 MED ORDER — METOPROLOL TARTRATE 50 MG PO TABS: 75.0000 mg | ORAL_TABLET | Freq: Two times a day (BID) | ORAL | Status: DC

## 2013-12-05 MED ORDER — METOPROLOL TARTRATE 50 MG PO TABS
75.0000 mg | ORAL_TABLET | Freq: Two times a day (BID) | ORAL | Status: DC
  Administered 2013-12-05: 75 mg via ORAL
  Filled 2013-12-05 (×2): qty 1

## 2013-12-05 MED ORDER — LEVALBUTEROL HCL 0.63 MG/3ML IN NEBU: 0.6300 mg | INHALATION_SOLUTION | Freq: Four times a day (QID) | RESPIRATORY_TRACT | Status: AC | PRN

## 2013-12-05 MED ORDER — WARFARIN SODIUM 2.5 MG PO TABS: 2.5000 mg | ORAL_TABLET | Freq: Every day | ORAL | Status: DC

## 2013-12-05 MED ORDER — FUROSEMIDE 40 MG PO TABS: 40.0000 mg | ORAL_TABLET | Freq: Every day | ORAL | Status: DC

## 2013-12-05 MED ORDER — POTASSIUM CHLORIDE CRYS ER 20 MEQ PO TBCR: 10.0000 meq | EXTENDED_RELEASE_TABLET | Freq: Every day | ORAL | Status: DC

## 2013-12-05 MED ORDER — SODIUM CHLORIDE 0.9 % IJ SOLN
10.0000 mL | INTRAMUSCULAR | Status: DC | PRN
  Administered 2013-12-05: 30 mL

## 2013-12-05 NOTE — Progress Notes (Signed)
Physical Therapy Treatment Patient Details Name: [name] [name] MRN: [id number] DOB: December 06, 1931 Today's Date: 12/05/2013 Time: 1002-1027 PT Time Calculation (min): 25 min  PT Assessment / Plan / Recommendation  History of Present Illness Pt transferred from [facility] for R LE embolectomy. Remained on vent 12/11-12/12; post-op hypotension requiring pressors.   PT Comments   Pt able to stand 3x with +2 A and poor activity tolerance.  Continue to recommend SNF.  Pt pleasant and cooperative.    Follow Up Recommendations  SNF     Does the patient have the potential to tolerate intense rehabilitation     Barriers to Discharge        Equipment Recommendations  None recommended by PT    Recommendations for Other Services    Frequency Min 3X/week   Progress towards PT Goals Progress towards PT goals: Progressing toward goals  Plan      Precautions / Restrictions Precautions Precautions: Fall Other Brace/Splint: pt normally uses Lt double-upright [name] (currently at home; too much edema to use) Restrictions Weight Bearing Restrictions: No   Pertinent Vitals/Pain o2 99%    Mobility  Transfers Sit to Stand: From chair/3-in-1;1: +2 Total assist Sit to Stand: Patient Percentage: 40% Stand to Sit: 1: +2 Total assist Stand to Sit: Patient Percentage: 30% Transfer via Lift Equipment:  (recommended sara lift to CNA for transfer back to bed) Details for Transfer Assistance: did best when L hand was already placed on RW.  Pt giving good effort, but very weak and required +2 from low recliner chair.  Standing tolerance 30 seconds max with flexed posture and poorly controlled descent. Ambulation/Gait Ambulation/Gait Assistance: Not tested (comment)    Exercises General Exercises - Lower Extremity Long Arc Quad: AROM;Right;AAROM;Left;10 reps;Seated Hip Flexion/Marching: Strengthening;Both;10 reps;Seated (difficulty with L side)   PT Diagnosis:    PT Problem List:   PT Treatment  Interventions:     PT Goals (current goals can now be found in the care plan section)    Visit Information  Last PT Received On: 12/05/13 Assistance Needed: +2 History of Present Illness: Pt transferred from [facility] for R LE embolectomy. Remained on vent 12/11-12/12; post-op hypotension requiring pressors.    Subjective Data  Subjective:  (Pt sitting up in chair upon arrival.)   Cognition  Cognition Arousal/Alertness: Awake/alert Behavior During Therapy: WFL for tasks assessed/performed Overall Cognitive Status: Within Functional Limits for tasks assessed Memory: Decreased short-term memory    Balance  Balance Balance Assessed: Yes Static Standing Balance Static Standing - Balance Support: Bilateral upper extremity supported Static Standing - Level of Assistance: 1: +2 Total assist  End of Session PT - End of Session Equipment Utilized During Treatment: Gait belt Activity Tolerance: Patient limited by fatigue Patient left: in chair;with call bell/phone within reach Nurse Communication: Need for lift equipment (spoke to CNA. Rec. Huntley Dec plus from low recliner chair.)   GP     [name] [name] 12/05/2013, 11:24 AM

## 2013-12-05 NOTE — Progress Notes (Signed)
Clinical Social Worker facilitated patient discharge by contacting the patient, family and facility, [facility]. Patient agreeable to this plan and arranging transport via EMS . CSW will sign off, as social work intervention is no longer needed.  Maree Krabbe, MSW, [facility] [phone]

## 2013-12-05 NOTE — Progress Notes (Signed)
   VASCULAR PROGRESS NOTE  SUBJECTIVE: no specific complaints.  PHYSICAL EXAM: Filed Vitals:   12/04/13 1013 12/04/13 1336 12/04/13 2007 12/05/13 0438  BP: 110/72 110/64 125/67 115/81  Pulse: 100 116 108 115  Temp:  97.6 F (36.4 C) 97.3 F (36.3 C) 97.6 F (36.4 C)  TempSrc:  Oral Oral Oral  Resp:  18 18 18   Height:      Weight:      SpO2:  97% 98% 98%   Right foot warm and well-perfused. Right groin incision looks fine.  LABS: Lab Results  Component Value Date   WBC 5.8 12/04/2013   HGB 10.0* 12/04/2013   HCT 31.3* 12/04/2013   MCV 94.8 12/04/2013   PLT 149* 12/04/2013   Lab Results  Component Value Date   CREATININE 1.14 12/05/2013   Lab Results  Component Value Date   INR 1.71* 12/05/2013   CBG (last 3)   Recent Labs  12/04/13 2155 12/05/13 0639 12/05/13 1127  GLUCAP 129* 120* 221*    Principal Problem:   Ischemic leg Active Problems:   Atrial fibrillation   Acute respiratory failure with hypoxia   Hypotension   H/O: CVA (cerebrovascular accident)   COPD (chronic obstructive pulmonary disease)   Acute pulmonary edema   DM type 2, controlled, with complication   ASSESSMENT AND PLAN: 1. 12 Days Post-Op s/p: doing well status post right femoral thrombectomy. 2. For transfer to skilled nursing facility today. I will arrange follow up visit.  Cari Caraway Beeper: [phone] 12/05/2013

## 2013-12-05 NOTE — Progress Notes (Signed)
Clinical Social Work Department CLINICAL SOCIAL WORK PLACEMENT NOTE 12/05/2013  Patient:  [name],[name]  Account Number:  [id number] Admit date:  11/23/2013  Clinical Social Worker:  Maryclare Labrador, [facility]ors  Date/time:  11/30/2013 03:23 PM  Clinical Social Work is seeking post-discharge placement for this patient at the following level of care:   SKILLED NURSING   (*CSW will update this form in Epic as items are completed)     Patient/family provided with [facility] Department of Clinical Social Work's list of facilities offering this level of care within the geographic area requested by the patient (or if unable, by the patient's family).  11/30/2013  Patient/family informed of their freedom to choose among providers that offer the needed level of care, that participate in Medicare, Medicaid or managed care program needed by the patient, have an available bed and are willing to accept the patient.    Patient/family informed of [facility]' ownership interest in [facility], as well as of the fact that they are under no obligation to receive care at this facility.  PASARR submitted to EDS on 11/30/2013 PASARR number received from EDS on 11/30/2013  FL2 transmitted to all facilities in geographic area requested by pt/family on  11/30/2013 FL2 transmitted to all facilities within larger geographic area on   Patient informed that his/her managed care company has contracts with or will negotiate with  certain facilities, including the following:     Patient/family informed of bed offers received:  12/03/2013 Patient chooses bed at OTHER Physician recommends and patient chooses bed at    Patient to be transferred to OTHER on  12/05/2013 Patient to be transferred to facility by EMS  The following physician request were entered in Epic:   Additional Comments:  Maree Krabbe, MSW, [employer] [phone]

## 2013-12-05 NOTE — Progress Notes (Signed)
Patient Name: [name] [name] Date of Encounter: 12/05/2013     Principal Problem:   Ischemic leg Active Problems:   Atrial fibrillation   Acute respiratory failure with hypoxia   Hypotension   H/O: CVA (cerebrovascular accident)   COPD (chronic obstructive pulmonary disease)   Acute pulmonary edema   DM type 2, controlled, with complication    SUBJECTIVE  Feels well this am. No chest pain or dyspnea.  CURRENT MEDS . amiodarone  200 mg Oral Daily  . antiseptic oral rinse  15 mL Mouth Rinse QID  . antiseptic oral rinse  15 mL Mouth Rinse BID  . famotidine  20 mg Oral BID  . guaiFENesin  600 mg Oral BID  . insulin aspart  0-15 Units Subcutaneous TID WC  . metoprolol tartrate  50 mg Oral BID  . terazosin  5 mg Oral QODAY  . Warfarin - Pharmacist Dosing Inpatient   Does not apply q1800    OBJECTIVE  Filed Vitals:   12/04/13 1013 12/04/13 1336 12/04/13 2007 12/05/13 0438  BP: 110/72 110/64 125/67 115/81  Pulse: 100 116 108 115  Temp:  97.6 F (36.4 C) 97.3 F (36.3 C) 97.6 F (36.4 C)  TempSrc:  Oral Oral Oral  Resp:  18 18 18   Height:      Weight:      SpO2:  97% 98% 98%    Intake/Output Summary (Last 24 hours) at 12/05/13 0943 Last data filed at 12/05/13 0446  Gross per 24 hour  Intake    600 ml  Output   1400 ml  Net   -800 ml   Filed Weights   11/30/13 0600 12/01/13 0417 12/02/13 0412  Weight: 219 lb 5.7 oz (99.5 kg) 218 lb 0.6 oz (98.9 kg) 216 lb 0.8 oz (98 kg)    PHYSICAL EXAM General: Pleasant, NAD.  Neuro: Alert and oriented X 3. Moves all extremities spontaneously.  Psych: Normal affect.  HEENT: Normal  Neck: Supple without bruits or JVD.  Lungs: Mild basilar rales and wheezes. Heart: Irregular rhythm no s3, s4, or murmurs.  Abdomen: Soft, non-tender, non-distended, BS + x 4.  Extremities: Mild pitting edema bilat   Accessory Clinical Findings  CBC  Recent Labs  12/03/13 0520 12/04/13 0445  WBC 7.8 5.8  HGB 10.7* 10.0*  HCT  33.3* 31.3*  MCV 95.1 94.8  PLT 161 149*   Basic Metabolic Panel  Recent Labs  12/04/13 0445 12/05/13 0523  NA 133* 138  K 3.6 3.9  CL 97 100  CO2 28 31  GLUCOSE 105* 128*  BUN 22 22  CREATININE 1.23 1.14  CALCIUM 8.9 8.9   Liver Function Tests No results found for this basename: AST, ALT, ALKPHOS, BILITOT, PROT, ALBUMIN,  in the last 72 hours No results found for this basename: LIPASE, AMYLASE,  in the last 72 hours Cardiac Enzymes No results found for this basename: CKTOTAL, CKMB, CKMBINDEX, TROPONINI,  in the last 72 hours BNP No components found with this basename: POCBNP,  D-Dimer No results found for this basename: DDIMER,  in the last 72 hours Hemoglobin A1C No results found for this basename: HGBA1C,  in the last 72 hours Fasting Lipid Panel No results found for this basename: CHOL, HDL, LDLCALC, TRIG, CHOLHDL, LDLDIRECT,  in the last 72 hours Thyroid Function Tests No results found for this basename: TSH, T4TOTAL, FREET3, T3FREE, THYROIDAB,  in the last 72 hours  TELE  Atrial fibrillation with rate 110  ECG  Radiology/Studies  Dg Chest Port 1 View  12/01/2013   CLINICAL DATA:  Pulmonary edema.  EXAM: PORTABLE CHEST - 1 VIEW  COMPARISON:  11/29/2013.  FINDINGS: Left IJ line in stable position. Right carotid stent noted. Persistent prominent cardiomegaly is present. Pulmonary venous congestion and bilateral interstitial and alveolar infiltrates are noted consistent with persistent congestive heart failure and pulmonary edema. Superimposed pneumonia in the right lung base cannot be excluded. Small right pleural effusion cannot be excluded. No pneumothorax. No acute osseous abnormality.  IMPRESSION: Persistent unchanged congestive heart failure with pulmonary edema.   Electronically Signed   By: Maisie Fus  Register   On: 12/01/2013 07:40   Dg Chest Port 1 View  11/29/2013   CLINICAL DATA:  Assess edema.  EXAM: PORTABLE CHEST - 1 VIEW  COMPARISON:  11/28/2013   FINDINGS: Left jugular central line is near the junction of the superior vena cava and left innominate vein. Interstitial pulmonary edema has slightly improved. There continues to be increased densities at the right lung base. Heart size is mildly enlarged. Vascular stent on the right side of the neck.  IMPRESSION: Interstitial edema has minimally improved. There continues to be increased densities at the right lung base.   Electronically Signed   By: Richarda Overlie M.D.   On: 11/29/2013 07:41   Dg Chest Port 1 View  11/28/2013   CLINICAL DATA:  Shortness of breath.  EXAM: PORTABLE CHEST - 1 VIEW  COMPARISON:  11/27/2013.  FINDINGS: Left IJ catheter in good anatomic position. Right carotid stent. Cardiomegaly with pulmonary venous congestion and prominent bilateral pulmonary interstitial and alveolar infiltrates. Small right pleural effusion may be present. These findings are consistent with congestive heart failure with pulmonary edema. Superimposed pneumonia cannot be excluded. No pneumothorax. Degenerative changes both shoulders.  IMPRESSION: 1. Congestive heart failure with bilateral pulmonary edema. Small right pleural effusion may be present. Superimposed pneumonia cannot be excluded. 2. Left IJ catheter in stable position.  Right carotid stent.   Electronically Signed   By: Maisie Fus  Register   On: 11/28/2013 07:24   Dg Chest Port 1 View  11/27/2013   CLINICAL DATA:  Line placement  EXAM: PORTABLE CHEST - 1 VIEW  COMPARISON:  11/25/2013; 11/24/2013; 11/22/2013; 11/25/2010  FINDINGS: Grossly unchanged enlarged cardiac silhouette and mediastinal contours. Interval placement of left jugular approach central venous catheter with tip projected over the superior/ mid SVC. No pneumothorax. Bibasilar heterogeneous slightly nodular opacities are grossly unchanged, right greater than left. The pulmonary vasculature appears slightly indistinct, in particular within the right upper and mid lung. No definite pleural  effusion. Grossly unchanged bones.  IMPRESSION: 1. Interval placement of left jugular approach under venous catheter with tip projected over to the superior/mid SVC. No pneumothorax. 2. Persistent findings of bibasilar opacities, right greater than left, atelectasis versus infiltrate. 3. Cardiomegaly and possible mild asymmetric pulmonary edema, right greater than left.   Electronically Signed   By: Simonne Come M.D.   On: 11/27/2013 14:03   Dg Chest Port 1 View  11/25/2013   CLINICAL DATA:  Respiratory failure  EXAM: PORTABLE CHEST - 1 VIEW  COMPARISON:  11/24/2013  FINDINGS: Cardiac shadow is stable. The endotracheal tube and nasogastric catheter been removed in the interval. Bibasilar atelectatic changes are noted. No focal confluent infiltrate is seen. No acute bony abnormality is noted.  IMPRESSION: Bibasilar atelectatic changes   Electronically Signed   By: Alcide Clever M.D.   On: 11/25/2013 07:29   Portable Chest Xray  11/24/2013  CLINICAL DATA:  Endotracheal tube placement.  EXAM: PORTABLE CHEST - 1 VIEW  COMPARISON:  Chest radiograph performed 11/23/2013  FINDINGS: The patient's endotracheal tube is seen ending 4 cm above the carina. Enteric tube is noted extending below the diaphragm.  Lung expansion is mildly decreased. Vascular congestion is noted, with bilateral central airspace opacities, raising concern for mild pulmonary edema. No definite pleural effusion or pneumothorax is seen.  The cardiomediastinal silhouette is mildly enlarged. Calcification is noted within the aortic arch. No acute osseous abnormalities are identified.  IMPRESSION: 1. Endotracheal tube seen ending 4 cm above the carina. 2. Vascular congestion and mild cardiomegaly, with bilateral central airspace opacities, raising concern for mild pulmonary edema. Previously noted right basilar opacity has improved.   Electronically Signed   By: Roanna Raider M.D.   On: 11/24/2013 02:34   Dg Chest Portable 1 View  11/23/2013    CLINICAL DATA:  Shortness of breath; preoperative chest radiograph.  EXAM: PORTABLE CHEST - 1 VIEW  COMPARISON:  Chest radiograph performed 11/22/2013  FINDINGS: The lungs are well-aerated. Mild bibasilar airspace opacities are slightly worsened from the prior study and may reflect atelectasis, mild pneumonia or possibly mild pulmonary edema, given underlying vascular congestion. No pleural effusion or pneumothorax is seen.  The cardiomediastinal silhouette is mildly enlarged. No acute osseous abnormalities are seen.  IMPRESSION: Mild bibasilar airspace opacities are slightly worsened from the recent prior study and may reflect atelectasis, mild pneumonia or possibly mild pulmonary edema, given underlying vascular congestion and mild cardiomegaly.   Electronically Signed   By: Roanna Raider M.D.   On: 11/23/2013 21:51   Dg Abd Portable 1v  11/30/2013   CLINICAL DATA:  Abdominal distention.  Diabetes.  EXAM: PORTABLE ABDOMEN - 1 VIEW  COMPARISON:  Lumbar spine series 10 19 2009.  FINDINGS: Moderate gastric distention noted. Paucity of gas is noted distally. Gastric outlet obstruction cannot be excluded. Calcific density noted over the left kidney suggesting left nephrolithiasis. Degenerative changes lumbar spine and both hips.  IMPRESSION: Cannot exclude gastric outlet obstruction. These results will be called to the ordering clinician or representative by the Radiologist Assistant, and communication documented in the PACS Dashboard.   Electronically Signed   By: Maisie Fus  Register   On: 11/30/2013 12:11    ASSESSMENT AND PLAN 1. status post embolectomy to right leg  2. atrial fibrillation, INR is therapeutic.  3. prior CVA  4. chronic diastolic CHF   Plan: Will stop amiodarone now and will increase metoprolol for rate control. Will resume daily lasix at 40 mg daily (was on 60 mg at home).  Signed, Cassell Clement  MD

## 2013-12-05 NOTE — Progress Notes (Addendum)
11:48 AM: CSW received phone call from Dot Eulah Pont, rep at [facility], stating they have approval for patient to go to SNF. CSW notified patient and will notify family of dc today to SNF  CSW has sent over updated pt notes and clinicals to [facility] rep, Caleen Essex. CSW followed up with a phone call to [place] and asked when insurance authorization will be received. Cicero Duck stated she will look into this and anticipates today. CSW has also sent updated clinicals to SNF and [facility]. Awaiting insurance authorization at this time. CSW notified MD.  Maree Krabbe, MSW, [facility] [phone]

## 2013-12-05 NOTE — Progress Notes (Signed)
Removed left IJ per MD order per hospital policy. Tip intact upon removal, applied Vaseline gauze/gauze and hyperfix tape to site. Held pressure for 5 minutes and advised patient to remain in bed for 30 minutes. Will continue to monitor closely. Lajuana Matte, RN

## 2013-12-05 NOTE — Discharge Summary (Signed)
Physician Discharge Summary  [name] [name] ZOX:[id number] DOB: 06/30/1931 DOA: 11/23/2013  PCP: No primary provider on file.  Admit date: 11/23/2013 Discharge date: 12/05/2013  Time spent: >30 minutes  Recommendations for Outpatient Follow-up:  BMET to follow electrolytes and renal function Reassess BP and adjust medications Low sodium diet Close follow up to INR level and further adjustments to coumadin dose Follow up with urology in 1 week for voiding trials  Discharge Diagnoses:  Principal Problem:   Ischemic leg Active Problems:   Atrial fibrillation   Acute respiratory failure with hypoxia   Hypotension   H/O: CVA (cerebrovascular accident)   COPD (chronic obstructive pulmonary disease)   Acute pulmonary edema   DM type 2, controlled, with complication   Discharge Condition: stable and improved. Will discharge to SNF for rehab. Outpatient follow up with cardiology and vascular surgery  Diet recommendation: low carbohydrates and low sodium diet  Filed Weights   11/30/13 0600 12/01/13 0417 12/02/13 0412  Weight: 99.5 kg (219 lb 5.7 oz) 98.9 kg (218 lb 0.6 oz) 98 kg (216 lb 0.8 oz)    History of present illness:  [name] [name] is a 79 y.o. male who developed the sudden onset of weakness and discoloration of his right lower extremity at 9:30 AM yesterday. His wife noted that the right leg was white from the knee down and that it was a weak and numb. Of note, the patient has a history of atrial fibrillation. He has not been on anticoagulation. He was admitted at [facility]. It was felt that he likely had a left brain stroke and he underwent an extensive workup including carotid duplex, CT, and MRI. The stroke workup was negative. Today he had an arterial Doppler study which showed no significant arterial flow in the right lower extremity. There was no flow noted in the right common femoral artery were superficial femoral artery. There was significant calcific disease in the  superficial femoral artery. He also had a venous duplex scan which showed no evidence of DVT in the right lower extremity. Given that he likely had an acute arterial occlusion vascular surgery was consult the and he was transferred here for evaluation.  Prior to this admission he was ambulatory with a walker. He he admitted to some calf pain with ambulation which could be claudication. He also complained of some pain in his right foot at rest which could potentially represent rest pain. He was unaware of any previous history of nonhealing ulcers. He did have a previous stroke in 1997 which was apparently associated with left-sided weakness. He has undergone previous right carotid endarterectomy in another city and also previous right carotid stenting.  His risk factors for peripheral vascular disease include hypertension, diabetes, and a history of previous tobacco use.   Hospital Course:  Right Leg Ischemia  - embolic event  - s/p right femoral embolectomy and angioplasty  - post-operative management per vascular surgery   Atrial fibrillation  - CHADSVA score is 6  -rate controlled with metoprolol  -dose to continue being  adjusted per cardiology in outpatient setting -on coumadin adn therapeutic now   Acute on chronic respiratory failure with hypoxia  Resolved  Good O2 sat on chronic oxygen use  Continue diuretics   Acute pulmonary edema  - last CXR revealed vasc congestion  -continue laxis 40mg  daily -close follow up with cardiology for further control of his A. fib  Frequent loose stools  - was severely constipated and started on strong bowel regimen while  in ICU  -at discharge stool soft but no further diarrhea   Hypotension-  - resolved. Will discharge on metoprolol for rate controlled and lasix for volume control -close follow up of VS   H/O: CVA (cerebrovascular accident)  Now on coumadin   DM type 2  - cont low carbohydrates diet -continue oral hypoglycemic regimen;  PCP to decide further adjustments.  - A1c 5.5   Urinary retention  -foley replaced. Will need urology follow up in outpatient for voiding trials and urodynamics  GERD: continue zantac BID  Procedures: 12/12 Right femoral embolectomy. Bovine pericardial patch angioplasty of the right femoral artery.  12/12 2D echo>>>EF 60-65%, calcified aortic valve  ETT 12/11 >> 12/12 See below for x-ray reports  Consultations:  Cardiology  Vascular surgery  Discharge Exam: Filed Vitals:   12/05/13 0438  BP: 115/81  Pulse: 115  Temp: 97.6 F (36.4 C)  Resp: 18   General: No acute respiratory distress, good O2 sat on chronic oxygen supplementation Lungs: Clear to auscultation bilaterally without wheezes or crackles  Cardiovascular: IIRR, 2/6 murmur at left lower sternal border; no JVD Abdomen: Nontender, nondistended, soft, bowel sounds positive, no rebound, no ascites, no appreciable mass  Extremities: No significant cyanosis, clubbing, 1+ b/l pitting edema   Discharge Instructions  Discharge Orders   Future Appointments Provider Department Dept Phone   01/03/2014 9:15 AM Chuck Hint, MD [facility] [phone]   Future Orders Complete By Expires   Diet - low sodium heart healthy  As directed    Discharge instructions  As directed    Comments:     Take medications as prescribed Follow a low sodium diet Follow with cardiology and with vascular surgery as instructed  Please recheck INR in 2 days fr further coumadin adjustments   Increase activity slowly  As directed        Medication List    STOP taking these medications       aspirin EC 81 MG tablet      TAKE these medications       atorvastatin 10 MG tablet  Commonly known as:  LIPITOR  Take 10 mg by mouth daily.     benazepril 10 MG tablet  Commonly known as:  LOTENSIN  Take 10 mg by mouth 2 (two) times daily.     furosemide 40 MG tablet  Commonly known as:  LASIX  Take 1  tablet (40 mg total) by mouth daily.     glipiZIDE 10 MG 24 hr tablet  Commonly known as:  GLUCOTROL XL  Take 10 mg by mouth 2 (two) times daily.     levalbuterol 0.63 MG/3ML nebulizer solution  Commonly known as:  XOPENEX  Take 3 mLs (0.63 mg total) by nebulization every 6 (six) hours as needed for wheezing or shortness of breath.     metoprolol 50 MG tablet  Commonly known as:  LOPRESSOR  Take 1.5 tablets (75 mg total) by mouth 2 (two) times daily.     potassium chloride SA 20 MEQ tablet  Commonly known as:  K-DUR,KLOR-CON  Take 0.5 tablets (10 mEq total) by mouth daily.     ranitidine 150 MG tablet  Commonly known as:  ZANTAC  Take 150 mg by mouth 2 (two) times daily.     sitaGLIPtin-metformin 50-500 MG per tablet  Commonly known as:  JANUMET  Take 1 tablet by mouth 2 (two) times daily with a meal.     terazosin 5 MG capsule  Commonly known as:  HYTRIN  Take 5 mg by mouth every other day.     warfarin 2.5 MG tablet  Commonly known as:  COUMADIN  Take 1 tablet (2.5 mg total) by mouth daily.       No Known Allergies     Follow-up Information   Follow up with DICKSON,CHRISTOPHER S, MD In 4 weeks. (sent)    Specialty:  Vascular Surgery   Contact information:   [address] [place] [address] [phone]       The results of significant diagnostics from this hospitalization (including imaging, microbiology, ancillary and laboratory) are listed below for reference.    Significant Diagnostic Studies: Dg Chest Port 1 View  12/01/2013   CLINICAL DATA:  Pulmonary edema.  EXAM: PORTABLE CHEST - 1 VIEW  COMPARISON:  11/29/2013.  FINDINGS: Left IJ line in stable position. Right carotid stent noted. Persistent prominent cardiomegaly is present. Pulmonary venous congestion and bilateral interstitial and alveolar infiltrates are noted consistent with persistent congestive heart failure and pulmonary edema. Superimposed pneumonia in the right lung base cannot be excluded.  Small right pleural effusion cannot be excluded. No pneumothorax. No acute osseous abnormality.  IMPRESSION: Persistent unchanged congestive heart failure with pulmonary edema.   Electronically Signed   By: Maisie Fus  Register   On: 12/01/2013 07:40   Dg Chest Port 1 View  11/29/2013   CLINICAL DATA:  Assess edema.  EXAM: PORTABLE CHEST - 1 VIEW  COMPARISON:  11/28/2013  FINDINGS: Left jugular central line is near the junction of the superior vena cava and left innominate vein. Interstitial pulmonary edema has slightly improved. There continues to be increased densities at the right lung base. Heart size is mildly enlarged. Vascular stent on the right side of the neck.  IMPRESSION: Interstitial edema has minimally improved. There continues to be increased densities at the right lung base.   Electronically Signed   By: Richarda Overlie M.D.   On: 11/29/2013 07:41   Dg Chest Port 1 View  11/28/2013   CLINICAL DATA:  Shortness of breath.  EXAM: PORTABLE CHEST - 1 VIEW  COMPARISON:  11/27/2013.  FINDINGS: Left IJ catheter in good anatomic position. Right carotid stent. Cardiomegaly with pulmonary venous congestion and prominent bilateral pulmonary interstitial and alveolar infiltrates. Small right pleural effusion may be present. These findings are consistent with congestive heart failure with pulmonary edema. Superimposed pneumonia cannot be excluded. No pneumothorax. Degenerative changes both shoulders.  IMPRESSION: 1. Congestive heart failure with bilateral pulmonary edema. Small right pleural effusion may be present. Superimposed pneumonia cannot be excluded. 2. Left IJ catheter in stable position.  Right carotid stent.   Electronically Signed   By: Maisie Fus  Register   On: 11/28/2013 07:24   Dg Chest Port 1 View  11/27/2013   CLINICAL DATA:  Line placement  EXAM: PORTABLE CHEST - 1 VIEW  COMPARISON:  11/25/2013; 11/24/2013; 11/22/2013; 11/25/2010  FINDINGS: Grossly unchanged enlarged cardiac silhouette and  mediastinal contours. Interval placement of left jugular approach central venous catheter with tip projected over the superior/ mid SVC. No pneumothorax. Bibasilar heterogeneous slightly nodular opacities are grossly unchanged, right greater than left. The pulmonary vasculature appears slightly indistinct, in particular within the right upper and mid lung. No definite pleural effusion. Grossly unchanged bones.  IMPRESSION: 1. Interval placement of left jugular approach under venous catheter with tip projected over to the superior/mid SVC. No pneumothorax. 2. Persistent findings of bibasilar opacities, right greater than left, atelectasis versus infiltrate. 3. Cardiomegaly and  possible mild asymmetric pulmonary edema, right greater than left.   Electronically Signed   By: Simonne Come M.D.   On: 11/27/2013 14:03   Dg Chest Port 1 View  11/25/2013   CLINICAL DATA:  Respiratory failure  EXAM: PORTABLE CHEST - 1 VIEW  COMPARISON:  11/24/2013  FINDINGS: Cardiac shadow is stable. The endotracheal tube and nasogastric catheter been removed in the interval. Bibasilar atelectatic changes are noted. No focal confluent infiltrate is seen. No acute bony abnormality is noted.  IMPRESSION: Bibasilar atelectatic changes   Electronically Signed   By: Alcide Clever M.D.   On: 11/25/2013 07:29   Portable Chest Xray  11/24/2013   CLINICAL DATA:  Endotracheal tube placement.  EXAM: PORTABLE CHEST - 1 VIEW  COMPARISON:  Chest radiograph performed 11/23/2013  FINDINGS: The patient's endotracheal tube is seen ending 4 cm above the carina. Enteric tube is noted extending below the diaphragm.  Lung expansion is mildly decreased. Vascular congestion is noted, with bilateral central airspace opacities, raising concern for mild pulmonary edema. No definite pleural effusion or pneumothorax is seen.  The cardiomediastinal silhouette is mildly enlarged. Calcification is noted within the aortic arch. No acute osseous abnormalities are  identified.  IMPRESSION: 1. Endotracheal tube seen ending 4 cm above the carina. 2. Vascular congestion and mild cardiomegaly, with bilateral central airspace opacities, raising concern for mild pulmonary edema. Previously noted right basilar opacity has improved.   Electronically Signed   By: Roanna Raider M.D.   On: 11/24/2013 02:34   Dg Chest Portable 1 View  11/23/2013   CLINICAL DATA:  Shortness of breath; preoperative chest radiograph.  EXAM: PORTABLE CHEST - 1 VIEW  COMPARISON:  Chest radiograph performed 11/22/2013  FINDINGS: The lungs are well-aerated. Mild bibasilar airspace opacities are slightly worsened from the prior study and may reflect atelectasis, mild pneumonia or possibly mild pulmonary edema, given underlying vascular congestion. No pleural effusion or pneumothorax is seen.  The cardiomediastinal silhouette is mildly enlarged. No acute osseous abnormalities are seen.  IMPRESSION: Mild bibasilar airspace opacities are slightly worsened from the recent prior study and may reflect atelectasis, mild pneumonia or possibly mild pulmonary edema, given underlying vascular congestion and mild cardiomegaly.   Electronically Signed   By: Roanna Raider M.D.   On: 11/23/2013 21:51   Dg Abd Portable 1v  11/30/2013   CLINICAL DATA:  Abdominal distention.  Diabetes.  EXAM: PORTABLE ABDOMEN - 1 VIEW  COMPARISON:  Lumbar spine series 10 19 2009.  FINDINGS: Moderate gastric distention noted. Paucity of gas is noted distally. Gastric outlet obstruction cannot be excluded. Calcific density noted over the left kidney suggesting left nephrolithiasis. Degenerative changes lumbar spine and both hips.  IMPRESSION: Cannot exclude gastric outlet obstruction. These results will be called to the ordering clinician or representative by the Radiologist Assistant, and communication documented in the PACS Dashboard.   Electronically Signed   By: Maisie Fus  Register   On: 11/30/2013 12:11    Microbiology: Recent  Results (from the past 240 hour(s))  URINE CULTURE     Status: None   Collection Time    11/27/13  1:48 PM      Result Value Range Status   Specimen Description URINE, CATHETERIZED   Final   Special Requests NONE   Final   Culture  Setup Time     Final   Value: 11/27/2013 15:20     Performed at [facility] Count     Final  Value: NO GROWTH     Performed at [facility]   Culture     Final   Value: NO GROWTH     Performed at [facility]   Report Status 11/28/2013 FINAL   Final     Labs: Basic Metabolic Panel:  Recent Labs Lab 11/29/13 0410 11/30/13 0415 12/01/13 0445 12/02/13 0900 12/03/13 0520 12/04/13 0445 12/05/13 0523  NA 137 137 137 134* 135 133* 138  K 4.0 4.0 4.0 3.9 3.7 3.6 3.9  CL 101 101 101 97 97 97 100  CO2 27 28 29 29 30 28 31   GLUCOSE 159* 135* 110* 149* 115* 105* 128*  BUN 24* 29* 28* 25* 22 22 22   CREATININE 1.28 1.32 1.26 1.21 1.19 1.23 1.14  CALCIUM 8.9 9.1 9.0 9.2 9.0 8.9 8.9  MG 1.9 1.9 1.9  --   --   --   --   PHOS 1.7* 2.3 2.8  --   --   --   --    CBC:  Recent Labs Lab 11/30/13 0415 12/01/13 0445 12/02/13 0415 12/03/13 0520 12/04/13 0445  WBC 6.4 6.8 7.5 7.8 5.8  HGB 10.0* 10.1* 10.6* 10.7* 10.0*  HCT 30.6* 31.2* 32.2* 33.3* 31.3*  MCV 94.4 95.4 94.7 95.1 94.8  PLT 131* 145* 168 161 149*   CBG:  Recent Labs Lab 12/04/13 0622 12/04/13 1109 12/04/13 1609 12/04/13 2155 12/05/13 0639  GLUCAP 131* 178* 131* 129* 120*    Signed:  Channing Savich  [facility] 12/05/2013, 10:44 AM

## 2013-12-10 ENCOUNTER — Inpatient Hospital Stay (HOSPITAL_COMMUNITY)
Admission: AD | Admit: 2013-12-10 | Discharge: 2013-12-20 | DRG: 189 | Disposition: A | Discharge status: Rehabilitation Facility | Payer: Medicare HMO | Source: Other Acute Inpatient Hospital | Attending: Family Medicine | Admitting: Family Medicine

## 2013-12-10 ENCOUNTER — Encounter (HOSPITAL_COMMUNITY): Payer: Self-pay | Admitting: Internal Medicine

## 2013-12-10 DIAGNOSIS — R040 Epistaxis: Secondary | ICD-10-CM | POA: Diagnosis not present

## 2013-12-10 DIAGNOSIS — N390 Urinary tract infection, site not specified: Secondary | ICD-10-CM | POA: Diagnosis present

## 2013-12-10 DIAGNOSIS — R601 Generalized edema: Secondary | ICD-10-CM | POA: Diagnosis present

## 2013-12-10 DIAGNOSIS — D649 Anemia, unspecified: Secondary | ICD-10-CM | POA: Diagnosis present

## 2013-12-10 DIAGNOSIS — Z9981 Dependence on supplemental oxygen: Secondary | ICD-10-CM

## 2013-12-10 DIAGNOSIS — I129 Hypertensive chronic kidney disease with stage 1 through stage 4 chronic kidney disease, or unspecified chronic kidney disease: Secondary | ICD-10-CM | POA: Diagnosis present

## 2013-12-10 DIAGNOSIS — E11649 Type 2 diabetes mellitus with hypoglycemia without coma: Secondary | ICD-10-CM

## 2013-12-10 DIAGNOSIS — J449 Chronic obstructive pulmonary disease, unspecified: Secondary | ICD-10-CM | POA: Diagnosis present

## 2013-12-10 DIAGNOSIS — I824Z9 Acute embolism and thrombosis of unspecified deep veins of unspecified distal lower extremity: Secondary | ICD-10-CM | POA: Diagnosis present

## 2013-12-10 DIAGNOSIS — R609 Edema, unspecified: Secondary | ICD-10-CM

## 2013-12-10 DIAGNOSIS — I739 Peripheral vascular disease, unspecified: Secondary | ICD-10-CM | POA: Diagnosis present

## 2013-12-10 DIAGNOSIS — Z7901 Long term (current) use of anticoagulants: Secondary | ICD-10-CM

## 2013-12-10 DIAGNOSIS — Z87891 Personal history of nicotine dependence: Secondary | ICD-10-CM

## 2013-12-10 DIAGNOSIS — E440 Moderate protein-calorie malnutrition: Secondary | ICD-10-CM | POA: Insufficient documentation

## 2013-12-10 DIAGNOSIS — E118 Type 2 diabetes mellitus with unspecified complications: Secondary | ICD-10-CM

## 2013-12-10 DIAGNOSIS — J9600 Acute respiratory failure, unspecified whether with hypoxia or hypercapnia: Principal | ICD-10-CM | POA: Diagnosis present

## 2013-12-10 DIAGNOSIS — K219 Gastro-esophageal reflux disease without esophagitis: Secondary | ICD-10-CM | POA: Diagnosis present

## 2013-12-10 DIAGNOSIS — J9601 Acute respiratory failure with hypoxia: Secondary | ICD-10-CM | POA: Diagnosis present

## 2013-12-10 DIAGNOSIS — G9341 Metabolic encephalopathy: Secondary | ICD-10-CM | POA: Diagnosis not present

## 2013-12-10 DIAGNOSIS — I5033 Acute on chronic diastolic (congestive) heart failure: Secondary | ICD-10-CM | POA: Diagnosis present

## 2013-12-10 DIAGNOSIS — I4821 Permanent atrial fibrillation: Secondary | ICD-10-CM | POA: Diagnosis present

## 2013-12-10 DIAGNOSIS — J441 Chronic obstructive pulmonary disease with (acute) exacerbation: Secondary | ICD-10-CM | POA: Diagnosis present

## 2013-12-10 DIAGNOSIS — N179 Acute kidney failure, unspecified: Secondary | ICD-10-CM | POA: Diagnosis present

## 2013-12-10 DIAGNOSIS — R5381 Other malaise: Secondary | ICD-10-CM | POA: Diagnosis not present

## 2013-12-10 DIAGNOSIS — N401 Enlarged prostate with lower urinary tract symptoms: Secondary | ICD-10-CM | POA: Diagnosis present

## 2013-12-10 DIAGNOSIS — N183 Chronic kidney disease, stage 3 unspecified: Secondary | ICD-10-CM | POA: Diagnosis present

## 2013-12-10 DIAGNOSIS — I4891 Unspecified atrial fibrillation: Secondary | ICD-10-CM | POA: Diagnosis present

## 2013-12-10 DIAGNOSIS — IMO0002 Reserved for concepts with insufficient information to code with codable children: Secondary | ICD-10-CM | POA: Diagnosis not present

## 2013-12-10 DIAGNOSIS — I2510 Atherosclerotic heart disease of native coronary artery without angina pectoris: Secondary | ICD-10-CM | POA: Diagnosis present

## 2013-12-10 DIAGNOSIS — J810 Acute pulmonary edema: Secondary | ICD-10-CM

## 2013-12-10 DIAGNOSIS — I959 Hypotension, unspecified: Secondary | ICD-10-CM

## 2013-12-10 DIAGNOSIS — T83511A Infection and inflammatory reaction due to indwelling urethral catheter, initial encounter: Secondary | ICD-10-CM | POA: Diagnosis present

## 2013-12-10 DIAGNOSIS — Z86718 Personal history of other venous thrombosis and embolism: Secondary | ICD-10-CM

## 2013-12-10 DIAGNOSIS — E876 Hypokalemia: Secondary | ICD-10-CM | POA: Diagnosis present

## 2013-12-10 DIAGNOSIS — E785 Hyperlipidemia, unspecified: Secondary | ICD-10-CM | POA: Diagnosis present

## 2013-12-10 DIAGNOSIS — E1169 Type 2 diabetes mellitus with other specified complication: Secondary | ICD-10-CM

## 2013-12-10 DIAGNOSIS — Y846 Urinary catheterization as the cause of abnormal reaction of the patient, or of later complication, without mention of misadventure at the time of the procedure: Secondary | ICD-10-CM | POA: Diagnosis present

## 2013-12-10 DIAGNOSIS — I82409 Acute embolism and thrombosis of unspecified deep veins of unspecified lower extremity: Secondary | ICD-10-CM

## 2013-12-10 DIAGNOSIS — I509 Heart failure, unspecified: Secondary | ICD-10-CM | POA: Diagnosis present

## 2013-12-10 DIAGNOSIS — Z8673 Personal history of transient ischemic attack (TIA), and cerebral infarction without residual deficits: Secondary | ICD-10-CM

## 2013-12-10 DIAGNOSIS — Y9210 Unspecified residential institution as the place of occurrence of the external cause: Secondary | ICD-10-CM | POA: Diagnosis present

## 2013-12-10 DIAGNOSIS — Z79899 Other long term (current) drug therapy: Secondary | ICD-10-CM

## 2013-12-10 DIAGNOSIS — N138 Other obstructive and reflux uropathy: Secondary | ICD-10-CM | POA: Diagnosis present

## 2013-12-10 DIAGNOSIS — I69959 Hemiplegia and hemiparesis following unspecified cerebrovascular disease affecting unspecified side: Secondary | ICD-10-CM

## 2013-12-10 DIAGNOSIS — R579 Shock, unspecified: Secondary | ICD-10-CM | POA: Diagnosis not present

## 2013-12-10 DIAGNOSIS — R339 Retention of urine, unspecified: Secondary | ICD-10-CM | POA: Diagnosis present

## 2013-12-10 DIAGNOSIS — T17908A Unspecified foreign body in respiratory tract, part unspecified causing other injury, initial encounter: Secondary | ICD-10-CM | POA: Diagnosis not present

## 2013-12-10 DIAGNOSIS — J189 Pneumonia, unspecified organism: Secondary | ICD-10-CM | POA: Diagnosis present

## 2013-12-10 LAB — POCT I-STAT 3, ART BLOOD GAS (G3+)
Acid-Base Excess: 4 mmol/L — ABNORMAL HIGH (ref 0.0–2.0)
Bicarbonate: 27.9 mEq/L — ABNORMAL HIGH (ref 20.0–24.0)
pCO2 arterial: 40.2 mmHg (ref 35.0–45.0)
pH, Arterial: 7.45 (ref 7.350–7.450)
pO2, Arterial: 81 mmHg (ref 80.0–100.0)

## 2013-12-10 LAB — CBC
Hemoglobin: 10.1 g/dL — ABNORMAL LOW (ref 13.0–17.0)
MCHC: 32.3 g/dL (ref 30.0–36.0)
Platelets: 256 10*3/uL (ref 150–400)
RDW: 14.4 % (ref 11.5–15.5)
WBC: 6.7 10*3/uL (ref 4.0–10.5)

## 2013-12-10 LAB — GLUCOSE, CAPILLARY
Glucose-Capillary: 86 mg/dL (ref 70–99)
Glucose-Capillary: 59 mg/dL — ABNORMAL LOW (ref 70–99)
Glucose-Capillary: 94 mg/dL (ref 70–99)
Glucose-Capillary: 71 mg/dL (ref 70–99)
Glucose-Capillary: 80 mg/dL (ref 70–99)
Glucose-Capillary: 182 mg/dL — ABNORMAL HIGH (ref 70–99)

## 2013-12-10 LAB — BASIC METABOLIC PANEL
BUN: 32 mg/dL — ABNORMAL HIGH (ref 6–23)
CO2: 30 mEq/L (ref 19–32)
Chloride: 95 mEq/L — ABNORMAL LOW (ref 96–112)
GFR calc Af Amer: 46 mL/min — ABNORMAL LOW (ref >90)
GFR calc non Af Amer: 39 mL/min — ABNORMAL LOW (ref >90)
Potassium: 4.4 mEq/L (ref 3.5–5.1)

## 2013-12-10 LAB — PROTIME-INR
INR: 1.71 — ABNORMAL HIGH (ref 0.00–1.49)
Prothrombin Time: 19.6 seconds — ABNORMAL HIGH (ref 11.6–15.2)

## 2013-12-10 LAB — PROCALCITONIN: Procalcitonin: 5.33 ng/mL

## 2013-12-10 LAB — TROPONIN I: Troponin I: <0.3 ng/mL (ref <0.30)

## 2013-12-10 MED ORDER — ACETAMINOPHEN 650 MG RE SUPP: 650.0000 mg | Freq: Four times a day (QID) | RECTAL | Status: DC | PRN

## 2013-12-10 MED ORDER — SODIUM CHLORIDE 0.9 % IJ SOLN
3.0000 mL | Freq: Two times a day (BID) | INTRAMUSCULAR | Status: DC
  Administered 2013-12-10 – 2013-12-13 (×6): 3 mL via INTRAVENOUS

## 2013-12-10 MED ORDER — TERAZOSIN HCL 5 MG PO CAPS
5.0000 mg | ORAL_CAPSULE | ORAL | Status: DC
  Administered 2013-12-10: 5 mg via ORAL
  Filled 2013-12-10: qty 1

## 2013-12-10 MED ORDER — DEXTROSE 50 % IV SOLN: 25.0000 mL | Freq: Once | INTRAVENOUS | Status: AC | PRN

## 2013-12-10 MED ORDER — POTASSIUM CHLORIDE CRYS ER 10 MEQ PO TBCR
10.0000 meq | EXTENDED_RELEASE_TABLET | Freq: Every day | ORAL | Status: DC
  Administered 2013-12-10 – 2013-12-13 (×3): 10 meq via ORAL
  Filled 2013-12-10 (×4): qty 1

## 2013-12-10 MED ORDER — ACETAMINOPHEN 325 MG PO TABS: 650.0000 mg | ORAL_TABLET | Freq: Four times a day (QID) | ORAL | Status: DC | PRN

## 2013-12-10 MED ORDER — GLUCOSE 40 % PO GEL: 1.0000 | ORAL | Status: DC | PRN

## 2013-12-10 MED ORDER — DEXTROSE 5 % IV SOLN
1.0000 g | INTRAVENOUS | Status: DC
  Administered 2013-12-10: 1 g via INTRAVENOUS
  Filled 2013-12-10: qty 10

## 2013-12-10 MED ORDER — ONDANSETRON HCL 4 MG PO TABS: 4.0000 mg | ORAL_TABLET | Freq: Four times a day (QID) | ORAL | Status: DC | PRN

## 2013-12-10 MED ORDER — LEVALBUTEROL HCL 0.63 MG/3ML IN NEBU
0.6300 mg | INHALATION_SOLUTION | RESPIRATORY_TRACT | Status: DC | PRN
  Administered 2013-12-10 – 2013-12-11 (×2): 0.63 mg via RESPIRATORY_TRACT
  Filled 2013-12-10 (×2): qty 3

## 2013-12-10 MED ORDER — ONDANSETRON HCL 4 MG/2ML IJ SOLN: 4.0000 mg | Freq: Four times a day (QID) | INTRAMUSCULAR | Status: DC | PRN

## 2013-12-10 MED ORDER — GUAIFENESIN-DM 100-10 MG/5ML PO SYRP
5.0000 mL | ORAL_SOLUTION | ORAL | Status: DC | PRN
  Filled 2013-12-10 (×2): qty 5

## 2013-12-10 MED ORDER — DEXTROSE 50 % IV SOLN
50.0000 mL | Freq: Once | INTRAVENOUS | Status: AC | PRN
  Administered 2013-12-10: 50 mL via INTRAVENOUS
  Filled 2013-12-10: qty 50

## 2013-12-10 MED ORDER — FAMOTIDINE 20 MG PO TABS
20.0000 mg | ORAL_TABLET | Freq: Two times a day (BID) | ORAL | Status: DC
  Administered 2013-12-10 (×2): 20 mg via ORAL
  Filled 2013-12-10 (×4): qty 1

## 2013-12-10 MED ORDER — SODIUM CHLORIDE 0.9 % IV SOLN: 250.0000 mL | INTRAVENOUS | Status: DC | PRN

## 2013-12-10 MED ORDER — SODIUM CHLORIDE 0.9 % IJ SOLN
3.0000 mL | Freq: Two times a day (BID) | INTRAMUSCULAR | Status: DC
  Administered 2013-12-10 – 2013-12-13 (×6): 3 mL via INTRAVENOUS

## 2013-12-10 MED ORDER — SODIUM CHLORIDE 0.9 % IJ SOLN: 3.0000 mL | INTRAMUSCULAR | Status: DC | PRN

## 2013-12-10 MED ORDER — WARFARIN SODIUM 3 MG PO TABS
3.0000 mg | ORAL_TABLET | Freq: Once | ORAL | Status: AC
Start: 2013-12-10 — End: 2013-12-10
  Administered 2013-12-10: 3 mg via ORAL
  Filled 2013-12-10: qty 1

## 2013-12-10 MED ORDER — BISOPROLOL FUMARATE 5 MG PO TABS
2.5000 mg | ORAL_TABLET | Freq: Two times a day (BID) | ORAL | Status: DC
  Administered 2013-12-10: 2.5 mg via ORAL
  Filled 2013-12-10 (×3): qty 0.5

## 2013-12-10 MED ORDER — WARFARIN - PHARMACIST DOSING INPATIENT
Freq: Every day | Status: DC
  Administered 2013-12-12 – 2013-12-20 (×4)

## 2013-12-10 MED ORDER — DEXTROSE-NACL 5-0.9 % IV SOLN
INTRAVENOUS | Status: DC
  Administered 2013-12-10: 17:00:00 via INTRAVENOUS

## 2013-12-10 MED ORDER — MORPHINE SULFATE 2 MG/ML IJ SOLN: 1.0000 mg | INTRAMUSCULAR | Status: DC | PRN

## 2013-12-10 MED ORDER — OXYCODONE HCL 5 MG PO TABS: 5.0000 mg | ORAL_TABLET | ORAL | Status: DC | PRN

## 2013-12-10 MED ORDER — METOPROLOL TARTRATE 50 MG PO TABS
75.0000 mg | ORAL_TABLET | Freq: Two times a day (BID) | ORAL | Status: DC
  Administered 2013-12-10: 75 mg via ORAL
  Filled 2013-12-10 (×2): qty 1

## 2013-12-10 MED ORDER — FUROSEMIDE 10 MG/ML IJ SOLN
80.0000 mg | Freq: Two times a day (BID) | INTRAMUSCULAR | Status: DC
  Administered 2013-12-10: 80 mg via INTRAVENOUS
  Filled 2013-12-10 (×2): qty 8

## 2013-12-10 MED ORDER — ATORVASTATIN CALCIUM 10 MG PO TABS
10.0000 mg | ORAL_TABLET | Freq: Every day | ORAL | Status: DC
  Administered 2013-12-10 – 2013-12-20 (×10): 10 mg via ORAL
  Filled 2013-12-10 (×11): qty 1

## 2013-12-10 NOTE — Consult Note (Signed)
PULMONARY / CRITICAL CARE MEDICINE  Name: [name] [name] MRN: [id number] DOB: 07-11-1931    ADMISSION DATE:  12/10/2013 CONSULTATION DATE:  12/10/2013  REFERRING MD :  [facility] PRIMARY SERVICE:  [facility]  CHIEF COMPLAINT:  Acute respiratory failure  BRIEF PATIENT DESCRIPTION: 79 yo with diastolic dysfunction, AF and COPD admitted form NH with acute dyspnea and anasarca.  PCCM consulted to evaluate due to impending respiratory failure.  SIGNIFICANT EVENTS / STUDIES:  12/11  TTE >>> EF 60-65%, trivial effusion 12/28  Admitted with dyspnea and anasarca  LINE / TUBES: Foley 12/28 >>>  CULTURES: 12/28  MRSA PCR >>> neg 12/28  Urine >>>   ANTIBIOTICS:  HISTORY OF PRESENT ILLNESS:  79 yo with diastolic dysfunction, AF and COPD admitted form NH with acute dyspnea and anasarca. Reports dyspnea started 2 days ago and became progressively worse.  Also reports orthopnea, paroxysmal nocturnal dyspnea and generalized body swelling.  Denies cough, fever, chills.  There were no chest pain or palpitations.  PAST MEDICAL HISTORY :  Past Medical History  Diagnosis Date  . Hypertension   . Stroke   . COPD (chronic obstructive pulmonary disease)   . Diabetes mellitus without complication   . GERD (gastroesophageal reflux disease)   . A-fib    Past Surgical History  Procedure Laterality Date  . Carotid stent    . Parotid gland tumor excision    . Embolectomy Right 11/23/2013    Procedure: Right Femoral EMBOLECTOMY;  Surgeon: Chuck Hint, MD;  Location: [facility] OR;  Service: Vascular;  Laterality: Right;  Right Femoral Embolectomy with Bovine Paricardium patch angioplasty.   Prior to Admission medications   Medication Sig Start Date End Date Taking? Authorizing Provider  atorvastatin (LIPITOR) 10 MG tablet Take 10 mg by mouth daily.    Historical Provider, MD  benazepril (LOTENSIN) 10 MG tablet Take 10 mg by mouth 2 (two) times daily.    Historical Provider, MD  furosemide (LASIX) 40 MG tablet  Take 1 tablet (40 mg total) by mouth daily. 12/05/13   Vassie Loll, MD  glipiZIDE (GLUCOTROL XL) 10 MG 24 hr tablet Take 10 mg by mouth 2 (two) times daily.    Historical Provider, MD  levalbuterol Pauline Aus) 0.63 MG/3ML nebulizer solution Take 3 mLs (0.63 mg total) by nebulization every 6 (six) hours as needed for wheezing or shortness of breath. 12/05/13   Vassie Loll, MD  metoprolol (LOPRESSOR) 50 MG tablet Take 1.5 tablets (75 mg total) by mouth 2 (two) times daily. 12/05/13   Vassie Loll, MD  potassium chloride SA (K-DUR,KLOR-CON) 20 MEQ tablet Take 0.5 tablets (10 mEq total) by mouth daily. 12/05/13   Vassie Loll, MD  ranitidine (ZANTAC) 150 MG tablet Take 150 mg by mouth 2 (two) times daily.    Historical Provider, MD  sitaGLIPtin-metformin (JANUMET) 50-500 MG per tablet Take 1 tablet by mouth 2 (two) times daily with a meal.    Historical Provider, MD  terazosin (HYTRIN) 5 MG capsule Take 5 mg by mouth every other day.    Historical Provider, MD  warfarin (COUMADIN) 2.5 MG tablet Take 1 tablet (2.5 mg total) by mouth daily. 12/05/13   Vassie Loll, MD   No Known Allergies  FAMILY HISTORY:  History reviewed. No pertinent family history.  SOCIAL HISTORY:  reports that he has quit smoking. He does not have any smokeless tobacco history on file. He reports that he does not drink alcohol or use illicit drugs.  REVIEW OF SYSTEMS:   Constitutional: Negative  for fever, chills, weight loss, malaise/fatigue and diaphoresis.  HENT: Negative for hearing loss, ear pain, nosebleeds, congestion, sore throat, neck pain, tinnitus and ear discharge.   Eyes: Negative for blurred vision, double vision, photophobia, pain, discharge and redness.  Respiratory: Negative for cough, hemoptysis, sputum production. Positive for shortness of breath. Cardiovascular: Negative for chest pain, palpitations. Positive for orthopnea, claudication, leg swelling and PND.  Gastrointestinal: Negative for heartburn,  nausea, vomiting, abdominal pain, diarrhea, constipation, blood in stool and melena.  Genitourinary: Negative for dysuria, urgency, frequency, hematuria and flank pain. Positive for urinary retention. Musculoskeletal: Negative for myalgias, back pain, joint pain and falls.  Skin: Negative for itching and rash.  Neurological: Negative for dizziness, tingling, tremors, sensory change, speech change, focal weakness, seizures, loss of consciousness, weakness and headaches.  Endo/Heme/Allergies: Negative for environmental allergies and polydipsia. Does not bruise/bleed easily.  SUBJECTIVE:   VITAL SIGNS: Temp:  [98.2 F (36.8 C)] 98.2 F (36.8 C) (12/28 1147) Pulse Rate:  [84] 84 (12/28 1147) Resp:  [24] 24 (12/28 1147) BP: (129)/(51) 129/51 mmHg (12/28 1147) SpO2:  [98 %] 98 % (12/28 1147) Weight:  [99.4 kg (219 lb 2.2 oz)] 99.4 kg (219 lb 2.2 oz) (12/28 1147)  PHYSICAL EXAMINATION: General:  Work of breathing appears increased Neuro:  Sleepy, but arouses to voice HEENT:  NCAT, PERRL, moist membranes Neck:  JVD noted Cardiovascular:  Irregular, tachycardic Lungs:  Upper airway wheeze, diffuse rales Abdomen:  Soft, nontender, bowel sounds present, no organomegaly Musculoskeletal:  Moves all extremities, anasarca Skin:  Intact, no rash CBC  Recent Labs Lab 12/04/13 0445  WBC 5.8  HGB 10.0*  HCT 31.3*  PLT 149*   Coag's  Recent Labs Lab 12/04/13 0445 12/05/13 0523  INR 2.68* 1.71*   BMET  Recent Labs Lab 12/04/13 0445 12/05/13 0523  NA 133* 138  K 3.6 3.9  CL 97 100  CO2 28 31  BUN 22 22  CREATININE 1.23 1.14  GLUCOSE 105* 128*   Electrolytes  Recent Labs Lab 12/04/13 0445 12/05/13 0523  CALCIUM 8.9 8.9   Sepsis Markers No results found for this basename: LATICACIDVEN, PROCALCITON, O2SATVEN,  in the last 168 hours ABG No results found for this basename: PHART, PCO2ART, PO2ART,  in the last 168 hours Liver Enzymes No results found for this basename:  AST, ALT, ALKPHOS, BILITOT, ALBUMIN,  in the last 168 hours Cardiac Enzymes No results found for this basename: TROPONINI, PROBNP,  in the last 168 hours Glucose  Recent Labs Lab 12/04/13 2155 12/05/13 0639 12/05/13 1127 12/10/13 1145 12/10/13 1336 12/10/13 1444  GLUCAP 129* 120* 221* 86 59* 94   CXR: 12/28 >>>  ASSESSMENT / PLAN:  Acute respiratory failure Acute on chronic diastolic heart fail AF-RVR Acute pulmonary edema Anasarca Less likely HCAP COPD by history, possibly acute exacerbation Hypoglycemia  -->  CBC, BMP, PCT, BNP, Troponin, ABG, PCXR -->  Supplemental oxygen to goal SpO2>92 -->  BiPAP PRN -->  May need intubation / mechanical ventilation if no rapid improvement -->  Agree with aggressive diuresis and rate control -->  Xopenex -->  Would defer systemic steroids but reconsider if wheezing persists after diuresis -->  Would defer antibiotics, start if any eviednce of infection -->  Will follow  I have personally obtained history, examined patient, evaluated and interpreted laboratory and imaging results, reviewed medical records, formulated assessment / plan and placed orders.  Lonia Farber, MD Pulmonary and Critical Care Medicine [facility] Pager: [phone]  12/10/2013, 2:41  PM

## 2013-12-10 NOTE — Progress Notes (Signed)
Placed pt. On bipap as per order. Pt. Is tolerating well at this time. 

## 2013-12-10 NOTE — Progress Notes (Signed)
Called by EDP at [facility], Dr. Earl Gala, telephone [phone]. Patient discharge from [facility] on 12/05/2013. Had ischemic right leg, status post embolectomy by Dr. Edilia Bo. Presented with left great toe pain and altered mental status. Signs of left great toe ischemia on physical exam. Chest x-ray shows CHF. Patient on chronic oxygen. BNP elevated. Hypoglycemic despite 2 amps of D50. Vital signs 99.5 rectal, heart rate 91-105, blood pressure 139/56. Oxygen saturation 93% on nasal cannula oxygen,but some respiratory distress. Able to speak in sentences. Has received 2 amps of D50, D5 drip at 125 an hour. IV Lasix 40 mg. Admitted to step down, [facility].  Crista Curb, M.D. [facility] [phone]

## 2013-12-10 NOTE — Progress Notes (Signed)
E-Link Dr made aware of insp and exp high-pitched wheezes ,labored resps .Dazed look , seldom t replies to simple questions . CBG = 157 ( post dinner meal) . R.T. Notified of order to place Pt on Bipap .

## 2013-12-10 NOTE — Progress Notes (Signed)
PT RECEIVED TO UNIT . ALERT dyspnea on exertion AT REST . SEE VS SECTION . DR PAGED FOR ORDERS .

## 2013-12-10 NOTE — Consult Note (Signed)
Admit date: 12/10/2013 Referring Physician  Dr. Waymon Amato Primary Physician No primary provider on file. Primary Cardiologist  Dr. Patty Sermons Reason for Consultation  heart failure  HPI: 79 year old male with atrial fibrillation with rapid ventricular response, chronic anticoagulation with recent lower extremity embolic event likely from atrial fibrillation with hospitalization from 11/23/13 through 12/05/13 centimeter from [facility] emergency department with worsening dyspnea, anasarca, left foot pain as well as hypoglycemia.  During prior admission he underwent right femoral embolectomy by vascular surgery. He was treated as well for atrial fibrillation and chronic diastolic heart failure and at times, his atrial fibrillation heart rate was difficult to manage because of hypotension. On discharge he was taking 75 mg twice a day of metoprolol. He was briefly on amiodarone during hospitalization. He had been residing at skilled nursing facility since discharge.  He is had some difficulty laying flat and breathing/orthopnea as well as PND. Denies any recent fevers. Possible UTI with acute renal failure as well. His wife currently states that he is breathing better. He appears quite tired and is using accessory musculature.   PMH:   Past Medical History  Diagnosis Date  . Hypertension   . Stroke   . COPD (chronic obstructive pulmonary disease)   . Diabetes mellitus without complication   . GERD (gastroesophageal reflux disease)   . A-fib     PSH:   Past Surgical History  Procedure Laterality Date  . Carotid stent    . Parotid gland tumor excision    . Embolectomy Right 11/23/2013    Procedure: Right Femoral EMBOLECTOMY;  Surgeon: Chuck Hint, MD;  Location: [facility] OR;  Service: Vascular;  Laterality: Right;  Right Femoral Embolectomy with Bovine Paricardium patch angioplasty.   Allergies:  Review of patient's allergies indicates no known allergies. Prior to Admit Meds:    Prior to Admission medications   Medication Sig Start Date End Date Taking? Authorizing Provider  atorvastatin (LIPITOR) 10 MG tablet Take 10 mg by mouth daily.    Historical Provider, MD  benazepril (LOTENSIN) 10 MG tablet Take 10 mg by mouth 2 (two) times daily.    Historical Provider, MD  furosemide (LASIX) 40 MG tablet Take 1 tablet (40 mg total) by mouth daily. 12/05/13   Vassie Loll, MD  glipiZIDE (GLUCOTROL XL) 10 MG 24 hr tablet Take 10 mg by mouth 2 (two) times daily.    Historical Provider, MD  levalbuterol Pauline Aus) 0.63 MG/3ML nebulizer solution Take 3 mLs (0.63 mg total) by nebulization every 6 (six) hours as needed for wheezing or shortness of breath. 12/05/13   Vassie Loll, MD  metoprolol (LOPRESSOR) 50 MG tablet Take 1.5 tablets (75 mg total) by mouth 2 (two) times daily. 12/05/13   Vassie Loll, MD  potassium chloride SA (K-DUR,KLOR-CON) 20 MEQ tablet Take 0.5 tablets (10 mEq total) by mouth daily. 12/05/13   Vassie Loll, MD  ranitidine (ZANTAC) 150 MG tablet Take 150 mg by mouth 2 (two) times daily.    Historical Provider, MD  sitaGLIPtin-metformin (JANUMET) 50-500 MG per tablet Take 1 tablet by mouth 2 (two) times daily with a meal.    Historical Provider, MD  terazosin (HYTRIN) 5 MG capsule Take 5 mg by mouth every other day.    Historical Provider, MD  warfarin (COUMADIN) 2.5 MG tablet Take 1 tablet (2.5 mg total) by mouth daily. 12/05/13   Vassie Loll, MD   Fam HX:   History reviewed. No pertinent family history. Social HX:    History  Social History  . Marital Status: Married    Spouse Name: N/A    Number of Children: N/A  . Years of Education: N/A   Occupational History  . Not on file.   Social History Main Topics  . Smoking status: Former Games developer  . Smokeless tobacco: Not on file  . Alcohol Use: No  . Drug Use: No  . Sexual Activity: Not on file   Other Topics Concern  . Not on file   Social History Narrative  . No narrative on file      ROS:  All 11 ROS were addressed and are negative except what is stated in the HPI   Physical Exam: Blood pressure 129/51, pulse 84, temperature 98.2 F (36.8 C), temperature source Oral, resp. rate 24, height 5\' 7"  (1.702 m), weight 219 lb 2.2 oz (99.4 kg), SpO2 98.00%.   General: Mildly ill-appearing, well nourished, in mild respiratory distress, sleepy Head: Eyes PERRLA, No xanthomas.   Normal cephalic and atramatic  Lungs:   Mild rhonchi heard bilateral lower lobes. Mildly increased respiratory effort with use of accessory muscles. Audible upper airway wheeze.  Heart:   Irregularly irregular normal rate, diminished pulses bilateral lower extremities. No murmur, rubs, gallops.  No carotid bruit. Positive mid neck JVD.  No abdominal bruits.  Abdomen: Bowel sounds are positive, abdomen soft and non-tender without masses. No hepatosplenomegaly. Obese, protuberant Msk:  Back normal. Normal strength and tone for age. Extremities:  No clubbing, cyanosis. 3+ edema.  DP +1 anasarca Neuro: Alert and oriented X 3, non-focal, MAE x 4 GU: Deferred Rectal: Deferred Psych:  Good affect, responds appropriately      Labs: Lab Results  Component Value Date   WBC 5.8 12/04/2013   HGB 10.0* 12/04/2013   HCT 31.3* 12/04/2013   MCV 94.8 12/04/2013   PLT 149* 12/04/2013     Recent Labs Lab 12/05/13 0523  NA 138  K 3.9  CL 100  CO2 31  BUN 22  CREATININE 1.14  CALCIUM 8.9  GLUCOSE 128*   Labs on Admission: From [facility] ED  1. CBC: Hemoglobin 10.1, hematocrit 30.4, WBC 7.8 and platelets 233. 2. INR 1.8. 3. ABG: PH 7.41, PCO2 46, PO2 62, bicarbonate 29 on 2 L per minute. No comment made regarding oxygen saturation 4. CMP: Sodium 134, potassium 4.8, chloride 97, bicarbonate 28, anion gap 14, BUN 35, creatinine 1.6, glucose 61, calculated osmolarity 264, calcium 9.1, total bilirubin 0.8, AST 26, ALT 34, alkaline phosphatase 73. Albumin 2.9, total protein 6.1 5. Cardiac panel: CK  39, myoglobin 82.3 & troponin 0.02 6. ProBNP: 7300 7. Urine microscopy: Positive for blood and leukocyte esterase. 2 numerous to count RBCs and WBCs. 2+ bacteria. 8. EKG (independently reviewed): A. fib with ventricular rate at 100 beats per minute, normal axis, Q waves in anteroseptal leads. No acute findings. 9. Influenza A&B testing: Negative  10. Chest x-ray: Impression-vascular congestion and borderline cardiomegaly, with bilateral central air space opacities concerning for pulmonary edema. Pneumonia could have a similar appearance.   EKG:  12/06/13-EKG 103, atrial fibrillation, nonspecific ST changes. Personally viewed.   Echocardiogram: 11/24/13 - Left ventricle: Systolic function was normal. The estimated ejection fraction was in the range of 60% to 65%. - Mitral valve: Mildly to moderately calcified annulus. - Pericardium, extracardiac: A trivial pericardial effusion was identified.    ASSESSMENT/PLAN:    79 year old male with atrial fibrillation, persistent, acute on chronic diastolic heart failure with BNP of 7300, anasarca, peripheral vascular disease  with recent embolectomy on chronic anticoagulation.  1. Acute on chronic diastolic heart failure - ejection fraction 65%. He clearly appears fluid overloaded with anasarca, third spacing. I agree with Lasix 80 mg IV every 12 hours. I would try to diurese between 1 and 2 L net daily. If this is not achieved, increased to every 8 hours Lasix. I'm fine at this point continuing with metoprolol 75 mg twice a day especially with this previously difficult to control atrial fibrillation heart rate. Continue with strict fluid restrictions as well as salt restrictions.  2. Atrial fibrillation-currently reasonably rate controlled. Continue with metoprolol 75 g twice a day. During previous hospitalization, did not tolerate additional medications because of hypotension. He was briefly on amiodarone during that hospitalization however this was  discontinued after rate control strategy was determined to be best approach. If hypotension becomes an issue once again, I would first pullback on his ACE inhibitor.  3. Chronic anticoagulation-continue with warfarin. Pharmacy to dose. Prior embolic phenomenon with atrial fibrillation to lower extremity. Right lower extremity, ischemic leg previously.  4. Peripheral vascular disease-as above. He has been followed by Dr. Edilia Bo in the past.  5. COPD/emphysema exacerbation/acute respiratory failure with hypoxia-he does have a degree of upper airway wheeze. Lungs could be playing a role as well. Based upon his arterial blood gas, he does retain CO2 chronically. He is being treated for airway restriction as well as heart failure. Xopenex. Antibiotics. Per primary team.  Complex medical issues. Extensive review of medical records.  I will notify Dr. Patty Sermons, we will follow along.  Donato Schultz, MD  12/10/2013  1:51 PM

## 2013-12-10 NOTE — Progress Notes (Signed)
ANTICOAGULATION CONSULT NOTE - Initial Consult  Pharmacy Consult for coumadin Indication: atrial fibrillation  No Known Allergies  Patient Measurements: Height: 5\' 7"  (170.2 cm) Weight: 219 lb 2.2 oz (99.4 kg) IBW/kg (Calculated) : 66.1 Heparin Dosing Weight:   Vital Signs: Temp: 98.2 F (36.8 C) (12/28 1147) Temp src: Oral (12/28 1147) BP: 129/51 mmHg (12/28 1147) Pulse Rate: 84 (12/28 1147)  Labs: No results found for this basename: HGB, HCT, PLT, APTT, LABPROT, INR, HEPARINUNFRC, CREATININE, CKTOTAL, CKMB, TROPONINI,  in the last 72 hours  Estimated Creatinine Clearance: 58 ml/min (by C-G formula based on Cr of 1.14).   Medical History: Past Medical History  Diagnosis Date  . Hypertension   . Stroke   . COPD (chronic obstructive pulmonary disease)   . Diabetes mellitus without complication   . GERD (gastroesophageal reflux disease)   . A-fib     Medications:  Scheduled:  . atorvastatin  10 mg Oral Daily  . cefTRIAXone (ROCEPHIN)  IV  1 g Intravenous Q24H  . famotidine  20 mg Oral BID  . furosemide  80 mg Intravenous Q12H  . metoprolol  75 mg Oral BID  . potassium chloride SA  10 mEq Oral Daily  . sodium chloride  3 mL Intravenous Q12H  . sodium chloride  3 mL Intravenous Q12H  . terazosin  5 mg Oral QODAY   Infusions:    Assessment: 79 yo male with hx of afib will be continued on coumadin.  INR today is 1.8 from [facility]  Per med rec, patient was on coumadin 2.5mg  po daily prior to admission. Goal of Therapy:  INR 2-3 Monitor platelets by anticoagulation protocol: Yes   Plan:  1) Coumadin 3 mg po x1 2) Daily PT/INR  Adom Schoeneck, Tsz-Yin 12/10/2013,1:21 PM

## 2013-12-10 NOTE — H&P (Signed)
[facility]  History and Physical  Vega Withrow ZOX:[id number] DOB: March 15, 1931 DOA: 12/10/2013  Referring physician: [facility] PCP: No primary provider on file.  Outpatient Specialists:  1. Vascular surgery: Dr. Waverly Ferrari  Chief Complaint: Worsening dyspnea, body swelling, left foot pain and low blood sugars.  HPI: [name] [name] is a 79 y.o. male with history of CAD, PVD, atrial fibrillation on Coumadin, hypertension, DM 2, former smoker, CVA with residual left hemiparesis, was recently hospitalized at [facility] between 11/23/2013-12/05/2013 for ischemic right leg. During that admission, he underwent a right femoral embolectomy by vascular surgery. Cardiology had consulted for A. Fib & acute on chronic diastolic CHF. Foley catheter was placed for urinary retention. He was discharged to [facility] SNF in [place]. He presented to the [facility] ED today with complaints of worsening dyspnea of 2 days' duration, worsening generalized body swelling, no chest pain or cough and left foot pain. He also complains of orthopnea and PND. No fevers or chills. Left foot pain is intermittent and mostly to touch and spouse has noticed slight redness on the dorsal lateral aspect. Spouse also states that patient has been running low blood sugars for the last 2 nights. In the ED, he was found to be hypoglycemic, in acute respiratory failure from fluid overload, acute renal failure and possible UTI. He was transferred to [facility] step unit for further evaluation and management. Hospitalist consultation requested.   Review of Systems: All systems reviewed and apart from history of presenting illness, are negative.  Past Medical History  Diagnosis Date  . Hypertension   . Stroke   . COPD (chronic obstructive pulmonary disease)   . Diabetes mellitus without complication   . GERD (gastroesophageal reflux disease)   . A-fib    Past Surgical History  Procedure  Laterality Date  . Carotid stent    . Parotid gland tumor excision    . Embolectomy Right 11/23/2013    Procedure: Right Femoral EMBOLECTOMY;  Surgeon: Chuck Hint, MD;  Location: [facility] OR;  Service: Vascular;  Laterality: Right;  Right Femoral Embolectomy with Bovine Paricardium patch angioplasty.   Social History:  reports that he has quit smoking. He does not have any smokeless tobacco history on file. He reports that he does not drink alcohol or use illicit drugs. Married.  No Known Allergies  History reviewed. No pertinent family history. negative family history.  Prior to Admission medications   Medication Sig Start Date End Date Taking? Authorizing Provider  atorvastatin (LIPITOR) 10 MG tablet Take 10 mg by mouth daily.    Historical Provider, MD  benazepril (LOTENSIN) 10 MG tablet Take 10 mg by mouth 2 (two) times daily.    Historical Provider, MD  furosemide (LASIX) 40 MG tablet Take 1 tablet (40 mg total) by mouth daily. 12/05/13   Vassie Loll, MD  glipiZIDE (GLUCOTROL XL) 10 MG 24 hr tablet Take 10 mg by mouth 2 (two) times daily.    Historical Provider, MD  levalbuterol Pauline Aus) 0.63 MG/3ML nebulizer solution Take 3 mLs (0.63 mg total) by nebulization every 6 (six) hours as needed for wheezing or shortness of breath. 12/05/13   Vassie Loll, MD  metoprolol (LOPRESSOR) 50 MG tablet Take 1.5 tablets (75 mg total) by mouth 2 (two) times daily. 12/05/13   Vassie Loll, MD  potassium chloride SA (K-DUR,KLOR-CON) 20 MEQ tablet Take 0.5 tablets (10 mEq total) by mouth daily. 12/05/13   Vassie Loll, MD  ranitidine (ZANTAC) 150 MG tablet Take  150 mg by mouth 2 (two) times daily.    Historical Provider, MD  sitaGLIPtin-metformin (JANUMET) 50-500 MG per tablet Take 1 tablet by mouth 2 (two) times daily with a meal.    Historical Provider, MD  terazosin (HYTRIN) 5 MG capsule Take 5 mg by mouth every other day.    Historical Provider, MD  warfarin (COUMADIN) 2.5 MG tablet Take 1  tablet (2.5 mg total) by mouth daily. 12/05/13   Vassie Loll, MD   Physical Exam: Filed Vitals:   12/10/13 1147  BP: 129/51  Pulse: 84  Temp: 98.2 F (36.8 C)  TempSrc: Oral  Resp: 24  Height: 5\' 7"  (1.702 m)  Weight: 99.4 kg (219 lb 2.2 oz)  SpO2: 98%     General exam: Moderately built and nourished male patient, lying propped up in bed with mild respiratory distress.   Head, eyes and ENT: Nontraumatic and normocephalic. Pupils equally reacting to light and accommodation. Oral mucosa moist.  Neck: Supple. No carotid bruit or thyromegaly. JVD + +  Lymphatics: No lymphadenopathy.  Respiratory system: Reduced breath sounds bilaterally, especially in the bases with bibasal crackles. No wheezing or rhonchi. Mild increased work of breathing.  Cardiovascular system: S1 and S2 heard, irregularly irregular. No murmurs, gallops, clicks. 3+ pitting bilateral leg edema extending to anterior abdomen and back.  Gastrointestinal system: Abdomen is nondistended, soft and nontender. Normal bowel sounds heard. No organomegaly or masses appreciated. Extensive scrotal edema without acute findings. Surgical scar right groin without acute findings.  Central nervous system: Alert and oriented. No focal neurological deficits.  Extremities: Power 5 x 5 right extremities, 4 x 5 left upper extremity and 2 x 5 left lower extremity. Peripheral pulses difficult to appreciate and lower extremity secondary to significant edema. Patchy trace erythema over the dorsal lateral aspect of left foot without any other acute findings  Skin: No rashes or acute findings. Anasarca + +  Musculoskeletal system: Negative exam.  Psychiatry: Pleasant and cooperative.   Labs on Admission: From [facility] ED  1. CBC: Hemoglobin 10.1, hematocrit 30.4, WBC 7.8 and platelets 233. 2. INR 1.8. 3. ABG: PH 7.41, PCO2 46, PO2 62, bicarbonate 29 on 2 L per minute. No comment made regarding oxygen saturation 4. CMP:  Sodium 134, potassium 4.8, chloride 97, bicarbonate 28, anion gap 14, BUN 35, creatinine 1.6, glucose 61, calculated osmolarity 264, calcium 9.1, total bilirubin 0.8, AST 26, ALT 34, alkaline phosphatase 73. Albumin 2.9, total protein 6.1 5. Cardiac panel: CK 39, myoglobin 82.3 & troponin 0.02 6. ProBNP: 7300 7. Urine microscopy: Positive for blood and leukocyte esterase. 2 numerous to count RBCs and WBCs. 2+ bacteria. 8. EKG (independently reviewed): A. fib with ventricular rate at 100 beats per minute, normal axis, Q waves in anteroseptal leads. No acute findings. 9. Influenza A&B testing: Negative  10. Chest x-ray: Impression-vascular congestion and borderline cardiomegaly, with bilateral central air space opacities concerning for pulmonary edema. Pneumonia could have a similar appearance.   CBG:  Recent Labs Lab 12/04/13 1609 12/04/13 2155 12/05/13 0639 12/05/13 1127 12/10/13 1145  GLUCAP 131* 129* 120* 221* 86    Radiological Exams on Admission: No results found.  Assessment/Plan Active Problems:   Atrial fibrillation   Acute respiratory failure with hypoxia   H/O: CVA (cerebrovascular accident)   COPD (chronic obstructive pulmonary disease)   Anasarca   Acute on chronic diastolic heart failure   Acute renal failure   Anemia   UTI (urinary tract infection)   Diabetes mellitus  with hypoglycemia   Acute on chronic diastolic CHF (congestive heart failure)  Acute on chronic diastolic CHF/Anasarca - Admitted to step down unit. Start aggressive IV diuresis with Lasix. Temporarily hold ACE inhibitor secondary to acute renal failure. Strict I and os. Cardiology consulted. BiPAP when necessary.  Acute respiratory failure with hypoxia - Secondary to decompensated CHF and COPD. Pneumonia less likely. Management as above. Followup chest x-ray in a.m. Critical care M.D. Consulted.  Recurrent hypoglycemia in DM 2 - Probably multifactorial secondary to oral hypoglycemics in the  context of worsening renal functions and poor oral intake. - DC all hypoglycemics. - Close monitoring of CBG and management per hypoglycemia protocol. May resume insulins when CBGs are consistently increasing.  Acute renal failure - Likely secondary to poor perfusion from decompensated CHF and ACE inhibitors. Hold ACE inhibitor she. Continue diuresis and trend BMP daily.  Atrial fibrillation with controlled ventricular rate - Continue beta blockers. INR is slightly subtherapeutic. Coumadin per pharmacy.  Possible catheter associated UTI/complicated UTI - Continue IV Rocephin pending urine culture results.  Anemia  - Chronic and stable. Follow CBC in a.m.  PAD, status post right femoral embolectomy - Seems stable. No acute findings in right lower extremity. Left foot findings of unclear etiology but does not look clinically suspicious for cellulitis or ischemia.  Hypertension - Controlled. Continue metoprolol   Code Status: Full  Family Communication:  discussed with spouse and daughter at bedside  Disposition Plan: Return to SNF when medically stable   Time spent: 70 minutes  Kylo Gavin, MD, FACP, FHM. [facility] Pager [phone]  If 7PM-7AM, please contact night-coverage [email or web address] Password [email or web address] 12/10/2013, 1:21 PM

## 2013-12-10 NOTE — Progress Notes (Signed)
RN took pt. Off bipap due to pt. Experiencing nausea. Pt. Place on nasal cannula for now.

## 2013-12-11 ENCOUNTER — Inpatient Hospital Stay (HOSPITAL_COMMUNITY): Payer: Medicare HMO

## 2013-12-11 DIAGNOSIS — N179 Acute kidney failure, unspecified: Secondary | ICD-10-CM

## 2013-12-11 DIAGNOSIS — I4891 Unspecified atrial fibrillation: Secondary | ICD-10-CM

## 2013-12-11 DIAGNOSIS — E118 Type 2 diabetes mellitus with unspecified complications: Secondary | ICD-10-CM

## 2013-12-11 LAB — CBC
HCT: 31.2 % — ABNORMAL LOW (ref 39.0–52.0)
Hemoglobin: 10.2 g/dL — ABNORMAL LOW (ref 13.0–17.0)
MCH: 30.6 pg (ref 26.0–34.0)
MCV: 93.7 fL (ref 78.0–100.0)
RBC: 3.33 MIL/uL — ABNORMAL LOW (ref 4.22–5.81)
RDW: 14.6 % (ref 11.5–15.5)
WBC: 5.9 10*3/uL (ref 4.0–10.5)

## 2013-12-11 LAB — COMPREHENSIVE METABOLIC PANEL
AST: 24 U/L (ref 0–37)
Albumin: 2.9 g/dL — ABNORMAL LOW (ref 3.5–5.2)
Alkaline Phosphatase: 76 U/L (ref 39–117)
BUN: 32 mg/dL — ABNORMAL HIGH (ref 6–23)
Calcium: 9 mg/dL (ref 8.4–10.5)
Chloride: 95 mEq/L — ABNORMAL LOW (ref 96–112)
Potassium: 4.6 mEq/L (ref 3.5–5.1)
Total Bilirubin: 1 mg/dL (ref 0.3–1.2)
Total Protein: 6.3 g/dL (ref 6.0–8.3)

## 2013-12-11 LAB — GLUCOSE, CAPILLARY
Glucose-Capillary: 132 mg/dL — ABNORMAL HIGH (ref 70–99)
Glucose-Capillary: 134 mg/dL — ABNORMAL HIGH (ref 70–99)
Glucose-Capillary: 124 mg/dL — ABNORMAL HIGH (ref 70–99)
Glucose-Capillary: 109 mg/dL — ABNORMAL HIGH (ref 70–99)
Glucose-Capillary: 156 mg/dL — ABNORMAL HIGH (ref 70–99)

## 2013-12-11 LAB — POCT I-STAT 3, ART BLOOD GAS (G3+)
Acid-Base Excess: 4 mmol/L — ABNORMAL HIGH (ref 0.0–2.0)
Bicarbonate: 28.7 mEq/L — ABNORMAL HIGH (ref 20.0–24.0)
O2 Saturation: 95 %
pCO2 arterial: 43.3 mmHg (ref 35.0–45.0)
pH, Arterial: 7.431 (ref 7.350–7.450)
pO2, Arterial: 73 mmHg — ABNORMAL LOW (ref 80.0–100.0)

## 2013-12-11 LAB — PROTIME-INR
INR: 2.16 — ABNORMAL HIGH (ref 0.00–1.49)
Prothrombin Time: 23.4 seconds — ABNORMAL HIGH (ref 11.6–15.2)

## 2013-12-11 MED ORDER — AMIODARONE HCL IN DEXTROSE 360-4.14 MG/200ML-% IV SOLN
30.0000 mg/h | INTRAVENOUS | Status: DC
  Administered 2013-12-11 – 2013-12-13 (×5): 30 mg/h via INTRAVENOUS
  Filled 2013-12-11 (×11): qty 200

## 2013-12-11 MED ORDER — FAMOTIDINE 20 MG PO TABS
20.0000 mg | ORAL_TABLET | Freq: Every day | ORAL | Status: DC
  Administered 2013-12-12 – 2013-12-20 (×8): 20 mg via ORAL
  Filled 2013-12-11 (×10): qty 1

## 2013-12-11 MED ORDER — AMIODARONE HCL IN DEXTROSE 360-4.14 MG/200ML-% IV SOLN
INTRAVENOUS | Status: AC
  Filled 2013-12-11: qty 200

## 2013-12-11 MED ORDER — VANCOMYCIN HCL 10 G IV SOLR
1500.0000 mg | Freq: Once | INTRAVENOUS | Status: AC
  Administered 2013-12-11: 1500 mg via INTRAVENOUS
  Filled 2013-12-11: qty 1500

## 2013-12-11 MED ORDER — PIPERACILLIN-TAZOBACTAM 3.375 G IVPB
3.3750 g | Freq: Three times a day (TID) | INTRAVENOUS | Status: DC
  Administered 2013-12-11 – 2013-12-15 (×15): 3.375 g via INTRAVENOUS
  Filled 2013-12-11 (×17): qty 50

## 2013-12-11 MED ORDER — WARFARIN SODIUM 2.5 MG PO TABS
2.5000 mg | ORAL_TABLET | Freq: Once | ORAL | Status: DC
  Filled 2013-12-11: qty 1

## 2013-12-11 MED ORDER — AMIODARONE HCL IN DEXTROSE 360-4.14 MG/200ML-% IV SOLN
60.0000 mg/h | INTRAVENOUS | Status: AC
  Administered 2013-12-11: 60 mg/h via INTRAVENOUS
  Filled 2013-12-11: qty 200

## 2013-12-11 MED ORDER — METHYLPREDNISOLONE SODIUM SUCC 125 MG IJ SOLR
60.0000 mg | Freq: Two times a day (BID) | INTRAMUSCULAR | Status: AC
  Administered 2013-12-11 – 2013-12-13 (×6): 60 mg via INTRAVENOUS
  Filled 2013-12-11 (×6): qty 0.96

## 2013-12-11 MED ORDER — FUROSEMIDE 10 MG/ML IJ SOLN
80.0000 mg | Freq: Four times a day (QID) | INTRAMUSCULAR | Status: DC
  Filled 2013-12-11 (×3): qty 8

## 2013-12-11 MED ORDER — LEVALBUTEROL HCL 0.63 MG/3ML IN NEBU
0.6300 mg | INHALATION_SOLUTION | Freq: Four times a day (QID) | RESPIRATORY_TRACT | Status: DC
  Administered 2013-12-11 – 2013-12-13 (×10): 0.63 mg via RESPIRATORY_TRACT
  Filled 2013-12-11 (×20): qty 3

## 2013-12-11 MED ORDER — VANCOMYCIN HCL IN DEXTROSE 750-5 MG/150ML-% IV SOLN
750.0000 mg | Freq: Two times a day (BID) | INTRAVENOUS | Status: DC
  Administered 2013-12-11 – 2013-12-15 (×9): 750 mg via INTRAVENOUS
  Filled 2013-12-11 (×11): qty 150

## 2013-12-11 MED ORDER — FUROSEMIDE 10 MG/ML IJ SOLN
80.0000 mg | Freq: Three times a day (TID) | INTRAMUSCULAR | Status: AC
  Administered 2013-12-11 – 2013-12-12 (×2): 80 mg via INTRAVENOUS

## 2013-12-11 NOTE — Progress Notes (Signed)
Pt transfer to ICU via RN. Report called to unit  and family aware.  Karena Addison T

## 2013-12-11 NOTE — Progress Notes (Signed)
eLink Physician-Brief Progress Note Patient Name: [name] [name] DOB: Nov 16, 1931 MRN: [id number]  Date of Service  12/11/2013   HPI/Events of Note   Notified that pt's MS has changed through the evening. He has hx R facial droop and some UE weakness. Currently poorly responsive but will respond to some questions and to pain. No new meds have been given. Per nursing he remains wheezy.   eICU Interventions  ABG now Consider Head Ct depending on the ABG results   Intervention Category Major Interventions: Change in mental status - evaluation and management  Bryar Dahms S. 12/11/2013, 9:32 PM

## 2013-12-11 NOTE — Progress Notes (Signed)
Pt's neuro status change noted at 2100 when resituating bp cuff.  Pt had been OAx4, able to follow commands in all 4 extremities, PERRLA at 2000.  Now pt is lethargic, able to follow commands on right side (thumbs up with right hand, wiggles toes on right foot) and small movement with left hand (unable to make thumbs up to command).  No movement noted on left foot.  Called Elink with noted changes.  RN and Dr. Delton Coombes cameraed in and were able to noted the new neuro findings.  ABG ordered at this time.  Will continue to assess.

## 2013-12-11 NOTE — Progress Notes (Signed)
ANTIBIOTIC CONSULT NOTE - INITIAL  Pharmacy Consult for Vancomycin/Zosyn Indication: rule out pneumonia  No Known Allergies  Patient Measurements: Height: 5\' 7"  (170.2 cm) Weight: 219 lb 2.2 oz (99.4 kg) IBW/kg (Calculated) : 66.1  Vital Signs: Temp: 100.1 F (37.8 C) (12/29 0008) Temp src: Oral (12/29 0008) BP: 94/28 mmHg (12/29 0015) Pulse Rate: 79 (12/28 2300) Intake/Output from previous day: 12/28 0701 - 12/29 0700 In: 530 [P.O.:240; I.V.:240; IV Piggyback:50] Out: 2150 [Urine:2150] Intake/Output from this shift: Total I/O In: 100 [I.V.:100] Out: 500 [Urine:500]  Labs:  Recent Labs  12/10/13 1630  WBC 6.7  HGB 10.1*  PLT 256  CREATININE 1.59*   Estimated Creatinine Clearance: 41.6 ml/min (by C-G formula based on Cr of 1.59). No results found for this basename: VANCOTROUGH, Leodis Binet, VANCORANDOM, GENTTROUGH, GENTPEAK, GENTRANDOM, TOBRATROUGH, TOBRAPEAK, TOBRARND, AMIKACINPEAK, AMIKACINTROU, AMIKACIN,  in the last 72 hours   Microbiology: Recent Results (from the past 720 hour(s))  MRSA PCR SCREENING     Status: None   Collection Time    11/24/13  1:39 AM      Result Value Range Status   MRSA by PCR NEGATIVE  NEGATIVE Final   Comment:            The GeneXpert MRSA Assay (FDA     approved for NASAL specimens     only), is one component of a     comprehensive MRSA colonization     surveillance program. It is not     intended to diagnose MRSA     infection nor to guide or     monitor treatment for     MRSA infections.  URINE CULTURE     Status: None   Collection Time    11/27/13  1:48 PM      Result Value Range Status   Specimen Description URINE, CATHETERIZED   Final   Special Requests NONE   Final   Culture  Setup Time     Final   Value: 11/27/2013 15:20     Performed at [facility] Count     Final   Value: NO GROWTH     Performed at [facility]   Culture     Final   Value: NO GROWTH     Performed at [facility]   Report Status 11/28/2013 FINAL   Final  MRSA PCR SCREENING     Status: None   Collection Time    12/10/13 12:09 PM      Result Value Range Status   MRSA by PCR NEGATIVE  NEGATIVE Final   Comment:            The GeneXpert MRSA Assay (FDA     approved for NASAL specimens     only), is one component of a     comprehensive MRSA colonization     surveillance program. It is not     intended to diagnose MRSA     infection nor to guide or     monitor treatment for     MRSA infections.    Medical History: Past Medical History  Diagnosis Date  . Hypertension   . Stroke   . COPD (chronic obstructive pulmonary disease)   . Diabetes mellitus without complication   . GERD (gastroesophageal reflux disease)   . A-fib    Assessment: 79 y/o M with increasing temp, hypotension to start broad spectrum antibiotics for r/o PNA. WBC 6.7, Tmax 100.1, renal function noted,  other labs as above.   Goal of Therapy:  Vancomycin trough level 15-20 mcg/ml  Plan:  -Vancomycin 1500 mg IV x 1 for LOAD, then 750 mg IV q12h -Zosyn 3.375G IV q8h to be infused over 4 hours -Trend WBC, temp, renal function -F/U any cultures, imaging -Drug levels as indicated  Abran Duke 12/11/2013,12:29 AM

## 2013-12-11 NOTE — Care Management Note (Addendum)
    Page 1 of 2   12/21/2013     5:29:56 PM   CARE MANAGEMENT NOTE 12/21/2013  Patient:  [name] [name]   Account Number:  [id number]  Date Initiated:  12/11/2013  Documentation initiated by:  Junius Creamer  Subjective/Objective Assessment:   adm w resp failure, at fib     Action/Plan:   was at snf for rehab   Anticipated DC Date:     Anticipated DC Plan:  SKILLED NURSING FACILITY  In-house referral  Clinical Social Worker      DC Planning Services  CM consult      Choice offered to / List presented to:             Status of service:  Completed, signed off Medicare Important Message given?   (If response is "NO", the following Medicare IM given date fields will be blank) Date Medicare IM given:   Date Additional Medicare IM given:    Discharge Disposition:  IP REHAB FACILITY  Per UR Regulation:  Reviewed for med. necessity/level of care/duration of stay  If discussed at Long Length of Stay Meetings, dates discussed:    Comments:  12/20/2013 Peer to Peer with [facility] / denial for CIR overturned. D/C to CIR. Crysal Hutchinson RN, BSN, MSHL, CCM  Benefit coverage Outcome: 12/18/2013 1423 by CMA -  [name] [name]--- -[name] [name]- auth required- [phone] tier 3 -[name] [name] required- [phone] tier 3 will also need to submit for quantity limit as they have limit of 1 per day [name] [name]:  [employer] Information called to Pharm:  Lisa at [phone] at 2:37pm 12/18/2013  12/18/2013 Benefits Check Request sent: Please check benefit coverage for the following: -Eliquis10mg  bid x 7 days and 5mg  qd  thereafter -[name] [name] 15 mg bid x 3 weeks and 20mg  qd therafter. Please send results to [employer] RN, BSN, [place], [place] [phone]  12/15/2013 CM met with patient and wife. Patient from [facility] [place], [place] and elects to return there at d/c. PCP:  Dr. Canary Brim (established and has seen PCP 3 visits) [name] [name]:  [employer] termed 12/13/2013  - NEW [name] [name];  [employer] effective 12/14/2013.  [name] [name]  has wrong PCP on card but family has notified [employer] and new card to be mailed to patient. CM has faxed copy of card  to Admitting [phone] and placed copy in MR/chart. Disposition Plan:  [facility] ADD:  12/18/2012 Donato Schultz RN, BSN, MSHL, CCM 12/15/2013

## 2013-12-11 NOTE — Progress Notes (Signed)
PULMONARY / CRITICAL CARE MEDICINE  Name: [name] [name] MRN: [id number] DOB: [birth year]    ADMISSION DATE:  12/10/2013 CONSULTATION DATE:  12/10/2013  REFERRING MD :  [facility] PRIMARY SERVICE:  [facility]  CHIEF COMPLAINT:  Acute respiratory failure  BRIEF PATIENT DESCRIPTION: 79 yo with diastolic dysfunction, AF and COPD admitted form NH with acute dyspnea and anasarca.  PCCM consulted to evaluate due to impending respiratory failure.  SIGNIFICANT EVENTS / STUDIES:  12/11  TTE >>> EF 60-65%, trivial effusion 12/28  Admitted with dyspnea and anasarca  LINE / TUBES: Foley 12/28 >>>  CULTURES: 12/28  MRSA PCR >>> neg 12/28  Urine >>>   ANTIBIOTICS: 12/29 vanc >> 12/29 zosyn >>  SUBJECTIVE:   VITAL SIGNS: Temp:  [98.4 F (36.9 C)-100.7 F (38.2 C)] 98.9 F (37.2 C) (12/29 1216) Pulse Rate:  [32-136] 110 (12/29 1200) Resp:  [16-36] 18 (12/29 1200) BP: (69-144)/(28-97) 93/62 mmHg (12/29 1200) SpO2:  [93 %-100 %] 96 % (12/29 1200) FiO2 (%):  [40 %] 40 % (12/29 0258)  12/29> currently on 2L [place]  PHYSICAL EXAMINATION: Gen: resting, oriented, comfortable HEENT: NCAT,  PULM: Wheezing bialterally CV: Irreg irreg, tachy AB: BS+, soft, nontender Ext; pitting edema bilat Neuro: A&O, maew   CBC  Recent Labs Lab 12/10/13 1630 12/11/13 0330  WBC 6.7 5.9  HGB 10.1* 10.2*  HCT 31.3* 31.2*  PLT 256 254   Coag's  Recent Labs Lab 12/05/13 0523 12/10/13 1420 12/11/13 0330  INR 1.71* 1.71* 2.16*   BMET  Recent Labs Lab 12/05/13 0523 12/10/13 1630 12/11/13 0330  NA 138 131* 134*  K 3.9 4.4 4.6  CL 100 95* 95*  CO2 31 30 29   BUN 22 32* 32*  CREATININE 1.14 1.59* 1.70*  GLUCOSE 128* 73 126*   Electrolytes  Recent Labs Lab 12/05/13 0523 12/10/13 1630 12/11/13 0330  CALCIUM 8.9 9.0 9.0  MG  --   --  1.9   Sepsis Markers  Recent Labs Lab 12/10/13 1630 12/11/13 0330  PROCALCITON 5.33 4.61   ABG  Recent Labs Lab 12/10/13 1521  PHART 7.450  PCO2ART  40.2  PO2ART 81.0   Liver Enzymes  Recent Labs Lab 12/11/13 0330  AST 24  ALT 16  ALKPHOS 76  BILITOT 1.0  ALBUMIN 2.9*   Cardiac Enzymes  Recent Labs Lab 12/10/13 1630  TROPONINI <0.30  PROBNP 5532.0*   Glucose  Recent Labs Lab 12/10/13 2139 12/10/13 2340 12/11/13 0115 12/11/13 0344 12/11/13 0814 12/11/13 1157  GLUCAP 161* 149* 132* 134* 124* 109*   CXR: 12/28 >>>  ASSESSMENT / PLAN:   PULMONARY: A: Acute respiratory failure> most likely pulm edema with viral bronchitis causing AE COPD; improved 12/19 Hypoxemia due to pulm edema> improved 12/29 with diuresis Mucus plugging overnight 12/29 resolved P: -add solumedrol -continue scheduled Xopenex -agree with continued diuresis -continue guaifenesin  CV:  A: Acute decompensated heart failure A-fib with RVR rate controlled with amiodarone Mild hypotension, likely related to diuresis P: -agree with holding BP meds -continue diuresis per cardiology  Renal: A: AKI likely related to diuresis Hypokalemia P: -continue diuresis given volume overload -repeat BMET in AM  GI: A: High aspiration risk P: -NPO for now  ID:  A: Viral bronchitis P: -wean off Abx if no clear evidence of pneumonia by 12/30  Code status: remains full, would continue to address Family: updated daughter at bedside Dispo: to SDU  Yolonda Kida PCCM Pager: [phone] Cell: [phone] If no response, call [phone]  12/11/2013, 1:45 PM

## 2013-12-11 NOTE — Progress Notes (Signed)
ANTICOAGULATION CONSULT NOTE - Follow-up  Pharmacy Consult for coumadin Indication: atrial fibrillation  No Known Allergies  Patient Measurements: Height: 5\' 7"  (170.2 cm) Weight: 219 lb 2.2 oz (99.4 kg) IBW/kg (Calculated) : 66.1  Vital Signs: Temp: 100.7 F (38.2 C) (12/29 0815) Temp src: Oral (12/29 0815) BP: 98/59 mmHg (12/29 0800) Pulse Rate: 69 (12/29 0815)  Labs:  Recent Labs  12/10/13 1420 12/10/13 1630 12/11/13 0330  HGB  --  10.1* 10.2*  HCT  --  31.3* 31.2*  PLT  --  256 254  LABPROT 19.6*  --  23.4*  INR 1.71*  --  2.16*  CREATININE  --  1.59* 1.70*  TROPONINI  --  <0.30  --     Estimated Creatinine Clearance: 38.9 ml/min (by C-G formula based on Cr of 1.7).  Assessment: 79 yo male continues on coumadin for afib. INR is therapeutic at 2.16. CBC is low stable, no bleeding noted.  Of note, pt was started on amiodarone which will increase the INR however we may not see this interaction for several days. Will need to watch closely.   Goal of Therapy:  INR 2-3   Plan:  1. Coumadin 2.5mg  PO x 1 tonight 2. F/u AM INR  Lysle Pearl, PharmD, BCPS Pager # [phone] 12/11/2013 9:42 AM

## 2013-12-11 NOTE — Progress Notes (Addendum)
Patient ID: [name] [name], male   DOB: Jan 30, 1931, 79 y.o.   MRN: [id number]   SUBJECTIVE: HR in 130s this am, atrial fibrillation with SBP 80s-100.  Denies dyspnea but appears to be moderately tachypneic.  Some confusion. Low grade fever.   Marland Kitchen atorvastatin  10 mg Oral Daily  . famotidine  20 mg Oral BID  . furosemide  80 mg Intravenous Q8H  . levalbuterol  0.63 mg Nebulization Q6H  . piperacillin-tazobactam (ZOSYN)  IV  3.375 g Intravenous Q8H  . potassium chloride SA  10 mEq Oral Daily  . sodium chloride  3 mL Intravenous Q12H  . sodium chloride  3 mL Intravenous Q12H  . vancomycin  750 mg Intravenous Q12H  . Warfarin - Pharmacist Dosing Inpatient   Does not apply q1800     Filed Vitals:   12/11/13 0400 12/11/13 0414 12/11/13 0500 12/11/13 0600  BP: 111/81  99/65 92/55  Pulse: 32 113 116 130  Temp:  100.4 F (38 C)    TempSrc:  Oral    Resp: 19 21 20 20   Height:      Weight:      SpO2: 98% 99% 96% 95%    Intake/Output Summary (Last 24 hours) at 12/11/13 0755 Last data filed at 12/11/13 0600  Gross per 24 hour  Intake    650 ml  Output   2150 ml  Net  -1500 ml    LABS: Basic Metabolic Panel:  Recent Labs  40/98/11 1630 12/11/13 0330  NA 131* 134*  K 4.4 4.6  CL 95* 95*  CO2 30 29  GLUCOSE 73 126*  BUN 32* 32*  CREATININE 1.59* 1.70*  CALCIUM 9.0 9.0  MG  --  1.9   Liver Function Tests:  Recent Labs  12/11/13 0330  AST 24  ALT 16  ALKPHOS 76  BILITOT 1.0  PROT 6.3  ALBUMIN 2.9*   No results found for this basename: LIPASE, AMYLASE,  in the last 72 hours CBC:  Recent Labs  12/10/13 1630 12/11/13 0330  WBC 6.7 5.9  HGB 10.1* 10.2*  HCT 31.3* 31.2*  MCV 94.8 93.7  PLT 256 254   Cardiac Enzymes:  Recent Labs  12/10/13 1630  TROPONINI <0.30   BNP: No components found with this basename: POCBNP,  D-Dimer: No results found for this basename: DDIMER,  in the last 72 hours Hemoglobin A1C: No results found for this basename: HGBA1C,  in  the last 72 hours Fasting Lipid Panel: No results found for this basename: CHOL, HDL, LDLCALC, TRIG, CHOLHDL, LDLDIRECT,  in the last 72 hours Thyroid Function Tests: No results found for this basename: TSH, T4TOTAL, FREET3, T3FREE, THYROIDAB,  in the last 72 hours Anemia Panel: No results found for this basename: VITAMINB12, FOLATE, FERRITIN, TIBC, IRON, RETICCTPCT,  in the last 72 hours  RADIOLOGY: Dg Chest Port 1 View  12/11/2013   CLINICAL DATA:  Shortness of breath and respiratory distress.  EXAM: PORTABLE CHEST - 1 VIEW  COMPARISON:  12/10/2013  FINDINGS: Cardiac enlargement with pulmonary vascular congestion. Infiltrates in the lung bases suggest early edema. No pleural effusion or pneumothorax suggested. Degenerative changes in the shoulders. Probably no significant change since previous study, allowing for differences in technique.  IMPRESSION: Cardiac enlargement with mild pulmonary vascular congestion and suggestion of basilar edema.   Electronically Signed   By: Burman Nieves M.D.   On: 12/11/2013 01:10   Dg Chest Port 1 View  12/01/2013   CLINICAL DATA:  Pulmonary  edema.  EXAM: PORTABLE CHEST - 1 VIEW  COMPARISON:  11/29/2013.  FINDINGS: Left IJ line in stable position. Right carotid stent noted. Persistent prominent cardiomegaly is present. Pulmonary venous congestion and bilateral interstitial and alveolar infiltrates are noted consistent with persistent congestive heart failure and pulmonary edema. Superimposed pneumonia in the right lung base cannot be excluded. Small right pleural effusion cannot be excluded. No pneumothorax. No acute osseous abnormality.  IMPRESSION: Persistent unchanged congestive heart failure with pulmonary edema.   Electronically Signed   By: Maisie Fus  Register   On: 12/01/2013 07:40   Dg Chest Port 1 View  11/29/2013   CLINICAL DATA:  Assess edema.  EXAM: PORTABLE CHEST - 1 VIEW  COMPARISON:  11/28/2013  FINDINGS: Left jugular central line is near the  junction of the superior vena cava and left innominate vein. Interstitial pulmonary edema has slightly improved. There continues to be increased densities at the right lung base. Heart size is mildly enlarged. Vascular stent on the right side of the neck.  IMPRESSION: Interstitial edema has minimally improved. There continues to be increased densities at the right lung base.   Electronically Signed   By: Richarda Overlie M.D.   On: 11/29/2013 07:41   Dg Chest Port 1 View  11/28/2013   CLINICAL DATA:  Shortness of breath.  EXAM: PORTABLE CHEST - 1 VIEW  COMPARISON:  11/27/2013.  FINDINGS: Left IJ catheter in good anatomic position. Right carotid stent. Cardiomegaly with pulmonary venous congestion and prominent bilateral pulmonary interstitial and alveolar infiltrates. Small right pleural effusion may be present. These findings are consistent with congestive heart failure with pulmonary edema. Superimposed pneumonia cannot be excluded. No pneumothorax. Degenerative changes both shoulders.  IMPRESSION: 1. Congestive heart failure with bilateral pulmonary edema. Small right pleural effusion may be present. Superimposed pneumonia cannot be excluded. 2. Left IJ catheter in stable position.  Right carotid stent.   Electronically Signed   By: Maisie Fus  Register   On: 11/28/2013 07:24   Dg Chest Port 1 View  11/27/2013   CLINICAL DATA:  Line placement  EXAM: PORTABLE CHEST - 1 VIEW  COMPARISON:  11/25/2013; 11/24/2013; 11/22/2013; 11/25/2010  FINDINGS: Grossly unchanged enlarged cardiac silhouette and mediastinal contours. Interval placement of left jugular approach central venous catheter with tip projected over the superior/ mid SVC. No pneumothorax. Bibasilar heterogeneous slightly nodular opacities are grossly unchanged, right greater than left. The pulmonary vasculature appears slightly indistinct, in particular within the right upper and mid lung. No definite pleural effusion. Grossly unchanged bones.  IMPRESSION: 1.  Interval placement of left jugular approach under venous catheter with tip projected over to the superior/mid SVC. No pneumothorax. 2. Persistent findings of bibasilar opacities, right greater than left, atelectasis versus infiltrate. 3. Cardiomegaly and possible mild asymmetric pulmonary edema, right greater than left.   Electronically Signed   By: Simonne Come M.D.   On: 11/27/2013 14:03   Dg Chest Port 1 View  11/25/2013   CLINICAL DATA:  Respiratory failure  EXAM: PORTABLE CHEST - 1 VIEW  COMPARISON:  11/24/2013  FINDINGS: Cardiac shadow is stable. The endotracheal tube and nasogastric catheter been removed in the interval. Bibasilar atelectatic changes are noted. No focal confluent infiltrate is seen. No acute bony abnormality is noted.  IMPRESSION: Bibasilar atelectatic changes   Electronically Signed   By: Alcide Clever M.D.   On: 11/25/2013 07:29   Portable Chest Xray  11/24/2013   CLINICAL DATA:  Endotracheal tube placement.  EXAM: PORTABLE CHEST - 1 VIEW  COMPARISON:  Chest radiograph performed 11/23/2013  FINDINGS: The patient's endotracheal tube is seen ending 4 cm above the carina. Enteric tube is noted extending below the diaphragm.  Lung expansion is mildly decreased. Vascular congestion is noted, with bilateral central airspace opacities, raising concern for mild pulmonary edema. No definite pleural effusion or pneumothorax is seen.  The cardiomediastinal silhouette is mildly enlarged. Calcification is noted within the aortic arch. No acute osseous abnormalities are identified.  IMPRESSION: 1. Endotracheal tube seen ending 4 cm above the carina. 2. Vascular congestion and mild cardiomegaly, with bilateral central airspace opacities, raising concern for mild pulmonary edema. Previously noted right basilar opacity has improved.   Electronically Signed   By: Roanna Raider M.D.   On: 11/24/2013 02:34   Dg Chest Portable 1 View  11/23/2013   CLINICAL DATA:  Shortness of breath; preoperative  chest radiograph.  EXAM: PORTABLE CHEST - 1 VIEW  COMPARISON:  Chest radiograph performed 11/22/2013  FINDINGS: The lungs are well-aerated. Mild bibasilar airspace opacities are slightly worsened from the prior study and may reflect atelectasis, mild pneumonia or possibly mild pulmonary edema, given underlying vascular congestion. No pleural effusion or pneumothorax is seen.  The cardiomediastinal silhouette is mildly enlarged. No acute osseous abnormalities are seen.  IMPRESSION: Mild bibasilar airspace opacities are slightly worsened from the recent prior study and may reflect atelectasis, mild pneumonia or possibly mild pulmonary edema, given underlying vascular congestion and mild cardiomegaly.   Electronically Signed   By: Roanna Raider M.D.   On: 11/23/2013 21:51   Dg Abd Portable 1v  11/30/2013   CLINICAL DATA:  Abdominal distention.  Diabetes.  EXAM: PORTABLE ABDOMEN - 1 VIEW  COMPARISON:  Lumbar spine series 10 19 2009.  FINDINGS: Moderate gastric distention noted. Paucity of gas is noted distally. Gastric outlet obstruction cannot be excluded. Calcific density noted over the left kidney suggesting left nephrolithiasis. Degenerative changes lumbar spine and both hips.  IMPRESSION: Cannot exclude gastric outlet obstruction. These results will be called to the ordering clinician or representative by the Radiologist Assistant, and communication documented in the PACS Dashboard.   Electronically Signed   By: Maisie Fus  Register   On: 11/30/2013 12:11    PHYSICAL EXAM General: NAD Neck: JVP 12 cm, no thyromegaly or thyroid nodule.  Lungs: Crackles 1/2 up lung fields bilaterally.  CV: Nondisplaced PMI.  Heart tachy, irregular S1/S2, no S3/S4, no murmur.  2+ edema to knees bilaterally.   Abdomen: Soft, nontender, no hepatosplenomegaly, no distention.  Neurologic: Alert and oriented x 3.  Psych: Normal affect. Extremities: No clubbing or cyanosis.   TELEMETRY: Reviewed telemetry pt in atrial  fibrillation with RVR  ASSESSMENT AND PLAN: 79 yo with history of chronic atrial fibrillation, COPD, and diastolic CHF admitted with respiratory failure.   1. Hypotension: SBP 80s-100s, mild hypotension at this point.  Concern for septic shock component with low grade fever and elevated PCT. EF normal on recent echo.  Would stop terazosin and bisoprolol for now.  Use amiodarone gtt for rate control.  2. ID: As above, ?septic shock.  On broad spectrum abx.  No definite PNA on CXR but cannot rule out.  3. Acute on chronic diastolic CHF: Patient is volume overloaded with elevated JVP in setting of atrial fibrillation with RVR.  Would back off Lasix a bit with rise in creatinine, use 80 mg IV every 8 hours x 3 doses and follow response.  AKI is likely going to be a complicating factor given hypotension but  pulmonary edema is certainly a large part of this problem. Control HR also.  4. Atrial fibrillation with RVR: HR in the 130s in setting of mild hypotension and CHF.  Stop bisoprolol and use amiodarone gtt for rate control.  Continue warfarin.  5. Acute on chronic kidney injury: Mild elevation in creatinine compared to yesterday.  Concerned this will worsen given low blood pressure.   Marca Ancona 12/11/2013 8:01 AM

## 2013-12-11 NOTE — Progress Notes (Signed)
PT on n/c. PT could not tolerate bipap

## 2013-12-11 NOTE — Progress Notes (Signed)
eLink Physician-Brief Progress Note Patient Name: [name] [name] DOB: 09/17/1931 MRN: [id number]  Date of Service  12/11/2013   HPI/Events of Note   Pt transferred from [facility] stepdown to ICU 80M for resp failure and hypotension  eICU Interventions  Mucus plug removed with relief. ABX expanded BDs ordered Keep in ICU over night, high risk need for vent    Intervention Category Major Interventions: Shock - evaluation and management;Respiratory failure - evaluation and management  Shan Levans 12/11/2013, 1:39 AM

## 2013-12-11 NOTE — Progress Notes (Signed)
Notified Md about pt BP dropping and Temp increasing. Unable to give lasix at this time.  Per Md move to ICU. [name] [name]

## 2013-12-12 LAB — GLUCOSE, CAPILLARY
Glucose-Capillary: 214 mg/dL — ABNORMAL HIGH (ref 70–99)
Glucose-Capillary: 224 mg/dL — ABNORMAL HIGH (ref 70–99)
Glucose-Capillary: 272 mg/dL — ABNORMAL HIGH (ref 70–99)
Glucose-Capillary: 314 mg/dL — ABNORMAL HIGH (ref 70–99)

## 2013-12-12 LAB — BASIC METABOLIC PANEL
CO2: 25 mEq/L (ref 19–32)
Calcium: 8.8 mg/dL (ref 8.4–10.5)
Chloride: 96 mEq/L (ref 96–112)
Glucose, Bld: 275 mg/dL — ABNORMAL HIGH (ref 70–99)
Potassium: 4.6 mEq/L (ref 3.7–5.3)
Sodium: 138 mEq/L (ref 137–147)

## 2013-12-12 LAB — PROCALCITONIN: Procalcitonin: 2.64 ng/mL

## 2013-12-12 MED ORDER — INSULIN ASPART 100 UNIT/ML ~~LOC~~ SOLN
0.0000 [IU] | SUBCUTANEOUS | Status: DC
  Administered 2013-12-12: 3 [IU] via SUBCUTANEOUS
  Administered 2013-12-12: 7 [IU] via SUBCUTANEOUS
  Administered 2013-12-12: 5 [IU] via SUBCUTANEOUS
  Administered 2013-12-12: 22:00:00 3 [IU] via SUBCUTANEOUS
  Administered 2013-12-13: 2 [IU] via SUBCUTANEOUS
  Administered 2013-12-13 (×3): 3 [IU] via SUBCUTANEOUS

## 2013-12-12 MED ORDER — FUROSEMIDE 10 MG/ML IJ SOLN
80.0000 mg | Freq: Three times a day (TID) | INTRAMUSCULAR | Status: DC
  Administered 2013-12-12 – 2013-12-16 (×12): 80 mg via INTRAVENOUS
  Filled 2013-12-12 (×16): qty 8

## 2013-12-12 MED ORDER — WARFARIN SODIUM 2.5 MG PO TABS
2.5000 mg | ORAL_TABLET | Freq: Once | ORAL | Status: AC
  Administered 2013-12-12: 2.5 mg via ORAL
  Filled 2013-12-12: qty 1

## 2013-12-12 MED ORDER — CHLORHEXIDINE GLUCONATE 0.12 % MT SOLN
15.0000 mL | Freq: Two times a day (BID) | OROMUCOSAL | Status: DC
  Administered 2013-12-12 – 2013-12-14 (×7): 15 mL via OROMUCOSAL
  Filled 2013-12-12 (×11): qty 15

## 2013-12-12 MED ORDER — BIOTENE DRY MOUTH MT LIQD
15.0000 mL | Freq: Two times a day (BID) | OROMUCOSAL | Status: DC
  Administered 2013-12-12 – 2013-12-20 (×17): 15 mL via OROMUCOSAL

## 2013-12-12 NOTE — Plan of Care (Signed)
Problem: Consults Goal: Diabetes Guidelines if Diabetic/Glucose > 140 If diabetic or lab glucose is > 140 mg/dl - Initiate Diabetes/Hyperglycemia Guidelines & Document Interventions  Outcome: Not Met (add Reason) CBG greater than 300 this am, patient has no SSI coverage, spoke with Dr. Trish Mage and ssi coverage added today 12/12/13.

## 2013-12-12 NOTE — Evaluation (Signed)
Occupational Therapy Evaluation Patient Details Name: [name] [name] MRN: [id number] DOB: 04/14/1931 Today's Date: 12/12/2013 Time: 7829-5621 OT Time Calculation (min): 42 min  OT Assessment / Plan / Recommendation History of present illness 79 y/o male with diastolic CHF, HTN COPD, NIRDM with A-fib with recent embolic event to right leg s/p right femoral embolectomy.  He was discharged to SNF on 12/05/12.  He was readmitted on 12/28 with acute respiratory failure suspected to be due to a COPD exacerbation and decompensated CHF and admitted by the hospitalist service and transferred to the ICU on 12/29 early AM for respiratory distress possibly due to mucous plugging. Required BiPAP   Clinical Impression   Pt admitted with above, and will benefit from continued OT to maximize safety and independence with BADLs.  He is familiar to OT from past admission.  He was only able to tolerate EOB activity and UE exercise today.  He was unable to stand with total A +1.  HR 89-107; 02 sats 95-98% 2L; dyspnea 3/4.  He will need SNF at discharge.     OT Assessment  Patient needs continued OT Services    Follow Up Recommendations  SNF;Supervision/Assistance - 24 hour    Barriers to Discharge Decreased caregiver support    Equipment Recommendations  None recommended by OT    Recommendations for Other Services    Frequency  Min 2X/week    Precautions / Restrictions Precautions Precautions: Fall Required Braces or Orthoses: Other Brace/Splint Other Brace/Splint: pt normally uses Lt double-upright [name] (currently at home; too much edema to use) Restrictions Weight Bearing Restrictions: No   Pertinent Vitals/Pain     ADL  Eating/Feeding: Minimal assistance Where Assessed - Eating/Feeding: Bed level Grooming: Wash/dry face;Wash/dry hands;Minimal assistance Where Assessed - Grooming: Supported sitting Upper Body Bathing: +1 Total assistance Where Assessed - Upper Body Bathing: Supported  sitting Lower Body Bathing: +1 Total assistance Where Assessed - Lower Body Bathing: Supine, head of bed up;Rolling right and/or left Upper Body Dressing: +1 Total assistance Where Assessed - Upper Body Dressing: Supported sitting Lower Body Dressing: +1 Total assistance Where Assessed - Lower Body Dressing: Supine, head of bed up;Supine, head of bed flat;Rolling right and/or left Toilet Transfer: +1 Total assistance (unable ) Toileting - Clothing Manipulation and Hygiene: +1 Total assistance Where Assessed - Toileting Clothing Manipulation and Hygiene: Supine, head of bed flat;Rolling right and/or left Transfers/Ambulation Related to ADLs: Pt unable to move sit to stand with total A +1 ADL Comments: Pt limited by dyspnea and fatigue as well as weakness    OT Diagnosis: Generalized weakness;Hemiplegia non-dominant side  OT Problem List: Decreased strength;Decreased activity tolerance;Impaired balance (sitting and/or standing);Decreased cognition;Decreased safety awareness;Decreased knowledge of use of DME or AE;Cardiopulmonary status limiting activity;Obesity;Impaired UE functional use;Increased edema OT Treatment Interventions: Self-care/ADL training;Therapeutic exercise;Neuromuscular education;DME and/or AE instruction;Therapeutic activities;Patient/family education;Balance training   OT Goals(Current goals can be found in the care plan section) Acute Rehab OT Goals Patient Stated Goal: to get better OT Goal Formulation: With patient Time For Goal Achievement: 12/26/13 Potential to Achieve Goals: Good ADL Goals Pt Will Perform Grooming: with supervision;sitting Pt Will Perform Upper Body Bathing: with min assist;sitting Pt Will Transfer to Toilet: with max assist;stand pivot transfer;bedside commode Pt Will Perform Toileting - Clothing Manipulation and hygiene: sit to/from stand;with max assist Pt/caregiver will Perform Home Exercise Program: Right Upper extremity;Left upper  extremity;With theraband;With minimal assist  Visit Information  Last OT Received On: 12/12/13 Assistance Needed: +2 History of Present Illness: 79 y/o male  with diastolic CHF, HTN COPD, NIRDM with A-fib with recent embolic event to right leg s/p right femoral embolectomy.  He was discharged to SNF on 12/05/12.  He was readmitted on 12/28 with acute respiratory failure suspected to be due to a COPD exacerbation and decompensated CHF and admitted by the hospitalist service and transferred to the ICU on 12/29 early AM for respiratory distress possibly due to mucous plugging. Required BiPAP       Prior Functioning     Home Living Family/patient expects to be discharged to:: Skilled nursing facility Prior Function Level of Independence: Needs assistance Gait / Transfers Assistance Needed: used rollator ADL's / Homemaking Assistance Needed: assist for dressing and bathing Comments: Pt reports that wife provided assist for BADLs for some time.  He has not been ambulatory since prior to previous admission.  Prior to discharge to SNF 12/23, he was pivoting to chair with max A, and he reports that SNF staff used a lift to transfer him to w/c only  Communication Communication: No difficulties Dominant Hand: Right         Vision/Perception Vision - History Patient Visual Report: No change from baseline Praxis Praxis: Impaired Praxis Impairment Details: Motor planning   Cognition  Cognition Arousal/Alertness: Awake/alert Behavior During Therapy: WFL for tasks assessed/performed Overall Cognitive Status: History of cognitive impairments - at baseline Memory: Decreased short-term memory (Pt's baseline)    Extremity/Trunk Assessment Upper Extremity Assessment Upper Extremity Assessment: LUE deficits/detail;RUE deficits/detail RUE Deficits / Details: Generalized weakness LUE Deficits / Details: Pt with h/o weakness due to old stroke.  Pt uses Lt UE as gross assist.  He demonstrates gross  grasp and release.  he is able to perform ~75% hand to mouth and demonstrates ~50* ROM shoulder elevation.  Moderate edema noted Lt. hand  Lower Extremity Assessment Lower Extremity Assessment: Defer to PT evaluation     Mobility Bed Mobility Bed Mobility: Supine to Sit;Sitting - Scoot to Edge of Bed;Sit to Supine;Scooting to [facility] Supine to Sit: 3: Mod assist;HOB elevated;With rails Sitting - Scoot to Edge of Bed: 2: Max assist Sit to Supine: 2: Max assist;HOB flat Scooting to HOB: 1: +1 Total assist Details for Bed Mobility Assistance: Pt able to assist with moving LEs off bed and with lifting and lowering UB Transfers Transfers: Not assessed Details for Transfer Assistance: Pt unable to lift buttocks from bed with total A +1     Exercise General Exercises - Upper Extremity Shoulder Flexion: AROM;AAROM;Right;Left;10 reps;Seated (2 sets) Elbow Flexion: AROM;AAROM;Right;Left;10 reps;Seated (2 sets)   Balance Balance Balance Assessed: Yes Static Sitting Balance Static Sitting - Balance Support: Feet supported;Right upper extremity supported Static Sitting - Level of Assistance: 4: Min assist;3: Mod assist Static Sitting - Comment/# of Minutes: Pt sat EOB x ~35 mins with min A initially but progressed to mod A as he fatigued.  Pt leaning posteriorly as he fatigued Dynamic Sitting Balance Dynamic Sitting - Balance Support: Feet supported;No upper extremity supported Dynamic Sitting - Level of Assistance: 3: Mod assist Dynamic Sitting Balance - Compensations: while performing UE exercise    End of Session OT - End of Session Activity Tolerance: Patient limited by fatigue Patient left: in bed;with call bell/phone within reach Nurse Communication: Mobility status  GO     [name] [name], Ursula Alert M 12/12/2013, 3:25 PM

## 2013-12-12 NOTE — Progress Notes (Signed)
Inpatient Diabetes Program Recommendations  AACE/ADA: New Consensus Statement on Inpatient Glycemic Control (2013)  Target Ranges:  Prepandial:   less than 140 mg/dL      Peak postprandial:   less than 180 mg/dL (1-2 hours)      Critically ill patients:  140 - 180 mg/dL   Pt on steroid therapy with hyperglycemia Noted NPO status due to high risk for aspiration Please consider using the ICU hyperglycemia during this time.    Thank you, Lenor Coffin, RN, CNS, Diabetes Coordinator [phone])

## 2013-12-12 NOTE — Progress Notes (Signed)
ANTICOAGULATION CONSULT NOTE - Follow-up  Pharmacy Consult for coumadin Indication: atrial fibrillation  No Known Allergies  Patient Measurements: Height: 5\' 7"  (170.2 cm) Weight: 208 lb 8.9 oz (94.6 kg) IBW/kg (Calculated) : 66.1  Vital Signs: Temp: 97.8 F (36.6 C) (12/30 0847) Temp src: Oral (12/30 0847) BP: 100/67 mmHg (12/30 1000) Pulse Rate: 93 (12/30 1000)  Labs:  Recent Labs  12/10/13 1420 12/10/13 1630 12/11/13 0330 12/12/13 0330  HGB  --  10.1* 10.2*  --   HCT  --  31.3* 31.2*  --   PLT  --  256 254  --   LABPROT 19.6*  --  23.4* 24.2*  INR 1.71*  --  2.16* 2.26*  CREATININE  --  1.59* 1.70* 1.65*  TROPONINI  --  <0.30  --   --     Estimated Creatinine Clearance: 39.1 ml/min (by C-G formula based on Cr of 1.65).  Assessment: 79 yo male continues on coumadin for afib. INR is therapeutic at 2.26 however, pt did not receive his dose last night d/t AMS/lethargy. Appears more alert today. No new CBC today, no bleeding noted.  Of note, pt was started on amiodarone which will increase the INR however we may not see this interaction for several days. Will need to watch closely.   Goal of Therapy:  INR 2-3   Plan:  1. Coumadin 2.5mg  PO x 1 tonight 2. F/u AM INR  Lysle Pearl, PharmD, BCPS Pager # [phone] 12/12/2013 11:39 AM

## 2013-12-12 NOTE — Progress Notes (Signed)
Patient ID: [name] [name], male   DOB: 10-03-1931, 79 y.o.   MRN: [id number]   SUBJECTIVE: HR controlled in 90s (atrial fibrillation) on amiodarone gtt.  Appears more comfortable this morning but still with audible wheezing.  He diuresed well yesterday and creatinine has remained stable.    Marland Kitchen antiseptic oral rinse  15 mL Mouth Rinse q12n4p  . atorvastatin  10 mg Oral Daily  . chlorhexidine  15 mL Mouth Rinse BID  . famotidine  20 mg Oral QHS  . furosemide  80 mg Intravenous Q8H  . furosemide  80 mg Intravenous Q8H  . insulin aspart  0-9 Units Subcutaneous Q4H  . levalbuterol  0.63 mg Nebulization Q6H  . methylPREDNISolone (SOLU-MEDROL) injection  60 mg Intravenous Q12H  . piperacillin-tazobactam (ZOSYN)  IV  3.375 g Intravenous Q8H  . potassium chloride SA  10 mEq Oral Daily  . sodium chloride  3 mL Intravenous Q12H  . sodium chloride  3 mL Intravenous Q12H  . vancomycin  750 mg Intravenous Q12H  . Warfarin - Pharmacist Dosing Inpatient   Does not apply q1800  amiodarone gtt   Filed Vitals:   12/12/13 0800 12/12/13 0847 12/12/13 0900 12/12/13 1000  BP: 109/70  118/60 100/67  Pulse: 110  68 93  Temp:  97.8 F (36.6 C)    TempSrc:  Oral    Resp: 16  16 13   Height:      Weight:      SpO2: 100%  96% 96%    Intake/Output Summary (Last 24 hours) at 12/12/13 1116 Last data filed at 12/12/13 1000  Gross per 24 hour  Intake  803.7 ml  Output   2745 ml  Net -1941.3 ml    LABS: Basic Metabolic Panel:  Recent Labs  40/98/11 0330 12/12/13 0330  NA 134* 138  K 4.6 4.6  CL 95* 96  CO2 29 25  GLUCOSE 126* 275*  BUN 32* 37*  CREATININE 1.70* 1.65*  CALCIUM 9.0 8.8  MG 1.9  --    Liver Function Tests:  Recent Labs  12/11/13 0330  AST 24  ALT 16  ALKPHOS 76  BILITOT 1.0  PROT 6.3  ALBUMIN 2.9*   No results found for this basename: LIPASE, AMYLASE,  in the last 72 hours CBC:  Recent Labs  12/10/13 1630 12/11/13 0330  WBC 6.7 5.9  HGB 10.1* 10.2*  HCT 31.3*  31.2*  MCV 94.8 93.7  PLT 256 254   Cardiac Enzymes:  Recent Labs  12/10/13 1630  TROPONINI <0.30   BNP: No components found with this basename: POCBNP,  D-Dimer: No results found for this basename: DDIMER,  in the last 72 hours Hemoglobin A1C: No results found for this basename: HGBA1C,  in the last 72 hours Fasting Lipid Panel: No results found for this basename: CHOL, HDL, LDLCALC, TRIG, CHOLHDL, LDLDIRECT,  in the last 72 hours Thyroid Function Tests: No results found for this basename: TSH, T4TOTAL, FREET3, T3FREE, THYROIDAB,  in the last 72 hours Anemia Panel: No results found for this basename: VITAMINB12, FOLATE, FERRITIN, TIBC, IRON, RETICCTPCT,  in the last 72 hours  PHYSICAL EXAM General: NAD Neck: JVP 12 cm, no thyromegaly or thyroid nodule.  Lungs: Crackles 1/2 up lung fields bilaterally with wheezing.  CV: Nondisplaced PMI.  Heart irregular S1/S2, no S3/S4, no murmur.  2+ edema to knees bilaterally.   Abdomen: Soft, nontender, no hepatosplenomegaly, no distention.  Neurologic: Alert and oriented x 3.  Psych: Normal affect. Extremities: No  clubbing or cyanosis.   TELEMETRY: Reviewed telemetry pt in atrial fibrillation with rate in 90s  ASSESSMENT AND PLAN: 79 yo with history of chronic atrial fibrillation, COPD, and diastolic CHF admitted with respiratory failure, suspect combination of acute/chronic diastolic CHF and acute exacerbation of COPD.   1. Pulmonary: Suspect acute exacerbation COPD, possible viral bronchitis as instigator.  Remains on antibiotics and steroids per CCM.  2. Acute on chronic diastolic CHF: Patient remains volume overloaded with elevated JVP in setting of atrial fibrillation with RVR (now atrial fibrillation better-controlled).  Creatinine has remained stable, so I will continue him on Lasix 80 mg IV every 8 hours today.  He diuresed reasonably on this regimen yesterday.  3. Atrial fibrillation with RVR: HR now controlled on amiodarone gtt.   Continue warfarin and amiodarone gtt, will try to transition back to po meds when pulmonary status is better and he has been diuresed more.  Would use bisoprolol + diltiazem CD to control his rate in the future.  4. Acute on chronic kidney injury: Creatinine stable.  Follow closely with diuresis.    Marca Ancona 12/12/2013 11:16 AM

## 2013-12-12 NOTE — Progress Notes (Signed)
[facility] Progress Note [place] TEAM 1 - Stepdown/ICU TEAM   [name] [name] ZOX:[id number] DOB: 25-Apr-1931 DOA: 12/10/2013 PCP: No PCP Per Patient  HPI/Subjective: Pt alert and sitting up in bed- cough with congestion- sputum difficult to bring up. No chest pain. Breathing much better.   Brief narrative: 79 y/o male with diastolic CHF, HTN COPD, NIRDM with A-fib with recent embolic event to right leg s/p right femoral embolectomy. He was discharged on Coumadin where previously he was only on ASA 81 mg. The hospital course was complicated by acute respiratory failure due to CHF exacerbation. He was  discharged to SNF on 12/23.   He was readmitted on 12/28 with acute respiratory failure suspected to be due to a COPD exacerbation and decompensated CHF and admitted by the hospitalist service and transferred to the ICU on 12/29 early AM for respiratory distress possibly due to mucous plugging. Required BiPAP.   Assessment/Plan: Principal Problem:   Acute respiratory failure with hypoxia- multifactorial- has been stable off of BiPAP for > 24 hrs  (a) acute diastolic CHF exacerbation - management per cardiology  (b) COPD exacerbation - mucous plugging on 12/29 - continue current dose of steroids today  -cont nebs and Robittusin DM-  add flutter valve -chronically on 2 L of O2 at home (at night?)  (c) HCAP? Viral Bronchitis?  - started on Vanc and Zosyn fon 12/29 early AM by PCCM - cont to follow for now- will need a repeat CXR once diuresed to look for underlying PNA  Active Problems:   Atrial fibrillation with RVR - currently controlled on IV Amiodarone - management per cardiology - INR theraputic   AKI on CKD 3  - cont to follow with diuresis   Recent embolic event right LE - s/p embolectomy    Diabetes mellitus- non-insulin requiring - sugars elevated due to steroids -place on sliding scale- takes Glucotrol as outpt - can resume   Hyperlipidemia  BPH - cont  Hytrin-     H/O: CVA (cerebrovascular accident)  Code Status: full code  Family Communication: with wife and daughter Disposition Plan: SNF- transfer to telemetry- PT/ OT  Consultants: Cardiology PCCM  Procedures: none  Antibiotics: 12/29 vanc >>  12/29 zosyn >>  DVT prophylaxis: therapeutic on Coumadin  Objective: Blood pressure 107/62, pulse 96, temperature 97 F (36.1 C), temperature source Oral, resp. rate 17, height 5\' 7"  (1.702 m), weight 94.6 kg (208 lb 8.9 oz), SpO2 94.00%.  Intake/Output Summary (Last 24 hours) at 12/12/13 1344 Last data filed at 12/12/13 1241  Gross per 24 hour  Intake  837.1 ml  Output   2730 ml  Net -1892.9 ml     Exam: General: AAO x 3, no distress Lungs: b/l ronchi and wheeze with basilar crackles- on 1 L O2 with pulse ox of 96% Cardiovascular: Irregular rate and rhythm without murmur - + JVD Abdomen: Nontender, nondistended, soft, bowel sounds positive, no rebound, no ascites, no appreciable mass Extremities: No significant cyanosis, clubbing- b/l edema R> L  Data Reviewed: Basic Metabolic Panel:  Recent Labs Lab 12/10/13 1630 12/11/13 0330 12/12/13 0330  NA 131* 134* 138  K 4.4 4.6 4.6  CL 95* 95* 96  CO2 30 29 25   GLUCOSE 73 126* 275*  BUN 32* 32* 37*  CREATININE 1.59* 1.70* 1.65*  CALCIUM 9.0 9.0 8.8  MG  --  1.9  --    Liver Function Tests:  Recent Labs Lab 12/11/13 0330  AST 24  ALT 16  ALKPHOS 76  BILITOT 1.0  PROT 6.3  ALBUMIN 2.9*   No results found for this basename: LIPASE, AMYLASE,  in the last 168 hours No results found for this basename: AMMONIA,  in the last 168 hours CBC:  Recent Labs Lab 12/10/13 1630 12/11/13 0330  WBC 6.7 5.9  HGB 10.1* 10.2*  HCT 31.3* 31.2*  MCV 94.8 93.7  PLT 256 254   Cardiac Enzymes:  Recent Labs Lab 12/10/13 1630  TROPONINI <0.30   BNP (last 3 results)  Recent Labs  12/10/13 1630  PROBNP 5532.0*   CBG:  Recent Labs Lab 12/11/13 2011  12/12/13 0002 12/12/13 0412 12/12/13 0810 12/12/13 1105  GLUCAP 156* 224* 260* 314* 272*    Recent Results (from the past 240 hour(s))  MRSA PCR SCREENING     Status: None   Collection Time    12/10/13 12:09 PM      Result Value Range Status   MRSA by PCR NEGATIVE  NEGATIVE Final   Comment:            The GeneXpert MRSA Assay (FDA     approved for NASAL specimens     only), is one component of a     comprehensive MRSA colonization     surveillance program. It is not     intended to diagnose MRSA     infection nor to guide or     monitor treatment for     MRSA infections.  URINE CULTURE     Status: None   Collection Time    12/10/13  6:02 PM      Result Value Range Status   Specimen Description URINE, CATHETERIZED   Final   Special Requests NONE   Final   Culture  Setup Time     Final   Value: 12/11/2013 01:10     Performed at [facility] Count     Final   Value: 50,000 COLONIES/ML     Performed at [facility]   Culture     Final   Value: GRAM NEGATIVE RODS     Performed at [facility]   Report Status PENDING   Incomplete     Studies:  Recent x-ray studies have been reviewed in detail by the Attending Physician  Scheduled Meds:  Scheduled Meds: . antiseptic oral rinse  15 mL Mouth Rinse q12n4p  . atorvastatin  10 mg Oral Daily  . chlorhexidine  15 mL Mouth Rinse BID  . famotidine  20 mg Oral QHS  . furosemide  80 mg Intravenous Q8H  . furosemide  80 mg Intravenous Q8H  . insulin aspart  0-9 Units Subcutaneous Q4H  . levalbuterol  0.63 mg Nebulization Q6H  . methylPREDNISolone (SOLU-MEDROL) injection  60 mg Intravenous Q12H  . piperacillin-tazobactam (ZOSYN)  IV  3.375 g Intravenous Q8H  . potassium chloride SA  10 mEq Oral Daily  . sodium chloride  3 mL Intravenous Q12H  . sodium chloride  3 mL Intravenous Q12H  . vancomycin  750 mg Intravenous Q12H  . warfarin  2.5 mg Oral ONCE-1800  . Warfarin - Pharmacist Dosing  Inpatient   Does not apply q1800   Continuous Infusions: . amiodarone (NEXTERONE PREMIX) 360 mg/200 mL dextrose 30 mg/hr (12/12/13 1024)  . dextrose 5 % and 0.9% NaCl 20 mL/hr at 12/10/13 1700    Time spent on care of this patient: 35 min   [name] Dunsworth, MD  [facility] Office  [phone]  Pager - Text Page per Loretha Stapler as per below:  On-Call/Text Page:      [email or web address]      password [email or web address]  If 7PM-7AM, please contact night-coverage [email or web address] Password [email or web address] 12/12/2013, 1:44 PM   LOS: 2 days

## 2013-12-13 LAB — BASIC METABOLIC PANEL
BUN: 39 mg/dL — ABNORMAL HIGH (ref 6–23)
Calcium: 8.2 mg/dL — ABNORMAL LOW (ref 8.4–10.5)
Chloride: 97 mEq/L (ref 96–112)
GFR calc non Af Amer: 42 mL/min — ABNORMAL LOW (ref >90)
Glucose, Bld: 223 mg/dL — ABNORMAL HIGH (ref 70–99)

## 2013-12-13 LAB — GLUCOSE, CAPILLARY
Glucose-Capillary: 197 mg/dL — ABNORMAL HIGH (ref 70–99)
Glucose-Capillary: 202 mg/dL — ABNORMAL HIGH (ref 70–99)
Glucose-Capillary: 204 mg/dL — ABNORMAL HIGH (ref 70–99)
Glucose-Capillary: 219 mg/dL — ABNORMAL HIGH (ref 70–99)
Glucose-Capillary: 239 mg/dL — ABNORMAL HIGH (ref 70–99)

## 2013-12-13 LAB — URINE CULTURE

## 2013-12-13 MED ORDER — INSULIN ASPART 100 UNIT/ML ~~LOC~~ SOLN
0.0000 [IU] | Freq: Three times a day (TID) | SUBCUTANEOUS | Status: DC
  Administered 2013-12-14: 12:00:00 5 [IU] via SUBCUTANEOUS
  Administered 2013-12-14 (×2): 3 [IU] via SUBCUTANEOUS
  Administered 2013-12-15: 5 [IU] via SUBCUTANEOUS
  Administered 2013-12-15: 3 [IU] via SUBCUTANEOUS
  Administered 2013-12-15: 5 [IU] via SUBCUTANEOUS
  Administered 2013-12-16: 12:00:00 3 [IU] via SUBCUTANEOUS
  Administered 2013-12-16 (×2): 5 [IU] via SUBCUTANEOUS
  Administered 2013-12-17: 3 [IU] via SUBCUTANEOUS
  Administered 2013-12-17: 18:00:00 5 [IU] via SUBCUTANEOUS
  Administered 2013-12-17: 3 [IU] via SUBCUTANEOUS
  Administered 2013-12-18: 17:00:00 5 [IU] via SUBCUTANEOUS
  Administered 2013-12-18: 3 [IU] via SUBCUTANEOUS
  Administered 2013-12-18: 11:00:00 2 [IU] via SUBCUTANEOUS
  Administered 2013-12-19: 12:00:00 5 [IU] via SUBCUTANEOUS
  Administered 2013-12-19 – 2013-12-20 (×3): 3 [IU] via SUBCUTANEOUS
  Administered 2013-12-20: 16:00:00 5 [IU] via SUBCUTANEOUS
  Administered 2013-12-20: 3 [IU] via SUBCUTANEOUS

## 2013-12-13 MED ORDER — GUAIFENESIN ER 600 MG PO TB12
600.0000 mg | ORAL_TABLET | Freq: Two times a day (BID) | ORAL | Status: DC
  Administered 2013-12-13 – 2013-12-20 (×15): 600 mg via ORAL
  Filled 2013-12-13 (×16): qty 1

## 2013-12-13 MED ORDER — DILTIAZEM HCL ER COATED BEADS 240 MG PO CP24
240.0000 mg | ORAL_CAPSULE | Freq: Every day | ORAL | Status: DC
  Administered 2013-12-14 – 2013-12-20 (×7): 240 mg via ORAL
  Filled 2013-12-13 (×7): qty 1

## 2013-12-13 MED ORDER — POTASSIUM CHLORIDE CRYS ER 20 MEQ PO TBCR: 20.0000 meq | EXTENDED_RELEASE_TABLET | Freq: Every day | ORAL | Status: DC

## 2013-12-13 MED ORDER — POTASSIUM CHLORIDE CRYS ER 10 MEQ PO TBCR
10.0000 meq | EXTENDED_RELEASE_TABLET | Freq: Once | ORAL | Status: AC
  Administered 2013-12-13: 14:00:00 10 meq via ORAL
  Filled 2013-12-13: qty 1

## 2013-12-13 MED ORDER — POTASSIUM CHLORIDE CRYS ER 20 MEQ PO TBCR
40.0000 meq | EXTENDED_RELEASE_TABLET | Freq: Every day | ORAL | Status: DC
  Administered 2013-12-13 – 2013-12-20 (×8): 40 meq via ORAL
  Filled 2013-12-13 (×8): qty 2

## 2013-12-13 MED ORDER — LEVALBUTEROL HCL 0.63 MG/3ML IN NEBU
0.6300 mg | INHALATION_SOLUTION | Freq: Three times a day (TID) | RESPIRATORY_TRACT | Status: DC
  Administered 2013-12-13 – 2013-12-20 (×21): 0.63 mg via RESPIRATORY_TRACT
  Filled 2013-12-13 (×41): qty 3

## 2013-12-13 MED ORDER — ZOLPIDEM TARTRATE 5 MG PO TABS
5.0000 mg | ORAL_TABLET | Freq: Every evening | ORAL | Status: DC | PRN
  Administered 2013-12-13: 5 mg via ORAL
  Filled 2013-12-13: qty 1

## 2013-12-13 MED ORDER — WARFARIN SODIUM 1 MG PO TABS
1.0000 mg | ORAL_TABLET | Freq: Once | ORAL | Status: AC
  Administered 2013-12-13: 17:00:00 1 mg via ORAL
  Filled 2013-12-13: qty 1

## 2013-12-13 NOTE — Progress Notes (Signed)
ANTICOAGULATION CONSULT NOTE - Follow-up  Pharmacy Consult for warfarin Indication: atrial fibrillation  No Known Allergies  Patient Measurements: Height: 6' (182.9 cm) Weight: 208 lb 4.8 oz (94.484 kg) IBW/kg (Calculated) : 77.6  Vital Signs: Temp: 97.5 F (36.4 C) (12/31 0542) Temp src: Oral (12/31 0542) BP: 121/90 mmHg (12/31 0542) Pulse Rate: 110 (12/31 0542)  Labs:  Recent Labs  12/10/13 1630 12/11/13 0330 12/12/13 0330 12/13/13 0534  HGB 10.1* 10.2*  --   --   HCT 31.3* 31.2*  --   --   PLT 256 254  --   --   LABPROT  --  23.4* 24.2* 29.2*  INR  --  2.16* 2.26* 2.89*  CREATININE 1.59* 1.70* 1.65* 1.50*  TROPONINI <0.30  --   --   --     Estimated Creatinine Clearance: 46.9 ml/min (by C-G formula based on Cr of 1.5).  Assessment: 82 YOM continues on warfarin for afib. INR is therapeutic at 2.89. Note patient missed a dose on 12/29 as he was lethargic, however an amiodarone gtt was added yesterday which potentiates INR. This addition is likely the cause of the large INR jump today. No new CBC today, no bleeding noted.  Goal of Therapy:  INR 2-3 Monitor platelets by anticoagulation protocol: Yes  Plan:  1. warfarin 1mg  PO x 1 tonight 2. Daily PT/INR  Rease Swinson D. Maniya Donovan, PharmD, BCPS Clinical Pharmacist Pager: [phone] 12/13/2013 8:34 AM

## 2013-12-13 NOTE — Evaluation (Signed)
Physical Therapy Evaluation Patient Details Name: [name] [name] MRN: [id number] DOB: 1930/12/27 Today's Date: 12/13/2013 Time: 0865-7846 PT Time Calculation (min): 34 min  PT Assessment / Plan / Recommendation History of Present Illness  79 y/o male with diastolic CHF, HTN COPD, NIRDM with A-fib with recent embolic event to right leg s/p right femoral embolectomy.  He was discharged to SNF on 12/05/12.  He was readmitted on 12/28 with acute respiratory failure suspected to be due to a COPD exacerbation and decompensated CHF and admitted by the hospitalist service and transferred to the ICU on 12/29 early AM for respiratory distress possibly due to mucous plugging. Required BiPAP  Clinical Impression  Pt admitted with above. Pt currently with functional limitations due to the deficits listed below (see PT Problem List).  Pt will benefit from skilled PT to increase their independence and safety with mobility to allow discharge to the venue listed below.       PT Assessment  Patient needs continued PT services    Follow Up Recommendations  SNF    Does the patient have the potential to tolerate intense rehabilitation      Barriers to Discharge        Equipment Recommendations  None recommended by PT    Recommendations for Other Services     Frequency Min 3X/week    Precautions / Restrictions Precautions Precautions: Fall Required Braces or Orthoses: Other Brace/Splint Other Brace/Splint: pt normally uses Lt double-upright [name] (currently at home; too much edema to use) Restrictions Weight Bearing Restrictions: No   Pertinent Vitals/Pain no apparent distress      Mobility  Bed Mobility Bed Mobility: Supine to Sit;Sitting - Scoot to Edge of Bed Supine to Sit: 3: Mod assist;HOB elevated;With rails Sitting - Scoot to Edge of Bed: 2: Max assist Details for Bed Mobility Assistance: Pt able to assist with moving LEs off bed and with lifting and lowering UB Transfers Transfers:  Stand Pivot Transfers;Sit to Stand;Stand to Sit Sit to Stand: 1: +2 Total assist Sit to Stand: Patient Percentage: 30% Stand to Sit: 1: +2 Total assist Stand to Sit: Patient Percentage: 30% Stand Pivot Transfers: 1: +2 Total assist Stand Pivot Transfers: Patient Percentage: 30% Details for Transfer Assistance: REquired +2 tot assist and bil knee blocking for liftoff from bed; fatigued quickly, and had to sit back down on bed whiel we regrouped for pivot transfer; Pt seemed encouraged that he was able to stand    Exercises     PT Diagnosis: Difficulty walking;Generalized weakness  PT Problem List: Decreased strength;Decreased activity tolerance;Decreased balance;Decreased mobility PT Treatment Interventions: DME instruction;Gait training;Functional mobility training;Therapeutic activities;Therapeutic exercise;Balance training     PT Goals(Current goals can be found in the care plan section) Acute Rehab PT Goals Patient Stated Goal: to get better PT Goal Formulation: With patient Time For Goal Achievement: 12/27/13 Potential to Achieve Goals: Good  Visit Information  Last PT Received On: 12/27/13 Assistance Needed: +2 History of Present Illness: 79 y/o male with diastolic CHF, HTN COPD, NIRDM with A-fib with recent embolic event to right leg s/p right femoral embolectomy.  He was discharged to SNF on 12/05/12.  He was readmitted on 12/28 with acute respiratory failure suspected to be due to a COPD exacerbation and decompensated CHF and admitted by the hospitalist service and transferred to the ICU on 12/29 early AM for respiratory distress possibly due to mucous plugging. Required BiPAP       Prior Functioning  Home Living Family/patient expects to be  discharged to:: Skilled nursing facility Living Arrangements: Spouse/significant other Prior Function Level of Independence: Needs assistance Gait / Transfers Assistance Needed: used rollator ADL's / Homemaking Assistance Needed:  assist for dressing and bathing Comments: Pt reports that wife provided assist for BADLs for some time.  He has not been ambulatory since prior to previous admission.  Prior to discharge to SNF 12/23, he was pivoting to chair with max A, and he reports that SNF staff used a lift to transfer him to w/c only  Communication Communication: No difficulties Dominant Hand: Right    Cognition  Cognition Arousal/Alertness: Awake/alert Behavior During Therapy: WFL for tasks assessed/performed Overall Cognitive Status: History of cognitive impairments - at baseline Memory: Decreased short-term memory (Pt's baseline)    Extremity/Trunk Assessment Upper Extremity Assessment Upper Extremity Assessment: Defer to OT evaluation RUE Deficits / Details: Generalized weakness LUE Deficits / Details: Pt with h/o weakness due to old stroke.  Pt uses Lt UE as gross assist.  He demonstrates gross grasp and release.  he is able to perform ~75% hand to mouth and demonstrates ~50* ROM shoulder elevation.  Moderate edema noted Lt. hand  Lower Extremity Assessment Lower Extremity Assessment: Generalized weakness;LLE deficits/detail LLE Deficits / Details: Decr ankle, knee hip strength; noted ankle instability in stance Cervical / Trunk Assessment Cervical / Trunk Assessment: Other exceptions   Balance Balance Balance Assessed: Yes Static Sitting Balance Static Sitting - Balance Support: Feet supported;Right upper extremity supported Static Sitting - Level of Assistance: 4: Min assist;3: Mod assist Static Sitting - Comment/# of Minutes: Pt sat EOB x ~35 mins with min A initially but progressed to mod A as he fatigued.  Pt leaning posteriorly as he fatigued  End of Session PT - End of Session Equipment Utilized During Treatment: Gait belt Activity Tolerance: Patient tolerated treatment well Patient left: in chair;with call bell/phone within reach;with family/visitor present Nurse Communication: Mobility status;Need  for lift equipment  GP     Van Clines [facility] [place], [place] [phone]  12/13/2013, 4:18 PM

## 2013-12-13 NOTE — Progress Notes (Signed)
Patient: [name] [name] / Admit Date: 12/10/2013 / Date of Encounter: 12/13/2013, 12:43 PM  Subjective  He states he is feeling better, but still has SOB and LEE. Continues to diurese. HR 80-90s on amio gtt this AM.  Objective   Telemetry: chronic AF HR 80s-low 100s, occ PVCs  Physical Exam: Blood pressure 121/90, pulse 110, temperature 97.5 F (36.4 C), temperature source Oral, resp. rate 20, height 6' (1.829 m), weight 208 lb 4.8 oz (94.484 kg), SpO2 100.00%. General: Well developed chronically ill app WM in no acute distress. Head: Normocephalic, atraumatic, sclera non-icteric, no xanthomas, nares are without discharge. Neck: JVP mod elevated. Lungs: Crackles at bases otherwise diffuse wheezing and coarse rhonchorous BS. Breathing is unlabored. Heart: Irreg irreg S1 S2 without murmurs, rubs, or gallops.  Abdomen: Soft, non-tender, non-distended with normoactive bowel sounds. No rebound/guarding. Extremities: No clubbing or cyanosis. 2+ pitting pale edema bilat LE.  Neuro: Alert and oriented X 3. Moves all extremities spontaneously. Psych:  Responds to questions appropriately with a normal affect.   Intake/Output Summary (Last 24 hours) at 12/13/13 1243 Last data filed at 12/13/13 1009  Gross per 24 hour  Intake 1012.7 ml  Output   2950 ml  Net -1937.3 ml    Inpatient Medications:  . antiseptic oral rinse  15 mL Mouth Rinse q12n4p  . atorvastatin  10 mg Oral Daily  . chlorhexidine  15 mL Mouth Rinse BID  . famotidine  20 mg Oral QHS  . furosemide  80 mg Intravenous Q8H  . insulin aspart  0-9 Units Subcutaneous Q4H  . levalbuterol  0.63 mg Nebulization TID  . methylPREDNISolone (SOLU-MEDROL) injection  60 mg Intravenous Q12H  . piperacillin-tazobactam (ZOSYN)  IV  3.375 g Intravenous Q8H  . potassium chloride SA  10 mEq Oral Daily  . sodium chloride  3 mL Intravenous Q12H  . sodium chloride  3 mL Intravenous Q12H  . vancomycin  750 mg Intravenous Q12H  . warfarin  1 mg  Oral ONCE-1800  . Warfarin - Pharmacist Dosing Inpatient   Does not apply q1800   Infusions:  . amiodarone (NEXTERONE PREMIX) 360 mg/200 mL dextrose 30 mg/hr (12/13/13 1017)  . dextrose 5 % and 0.9% NaCl 20 mL/hr at 12/10/13 1700    Labs:  Recent Labs  12/11/13 0330 12/12/13 0330 12/13/13 0534  NA 134* 138 142  K 4.6 4.6 3.6*  CL 95* 96 97  CO2 29 25 29   GLUCOSE 126* 275* 223*  BUN 32* 37* 39*  CREATININE 1.70* 1.65* 1.50*  CALCIUM 9.0 8.8 8.2*  MG 1.9  --   --     Recent Labs  12/11/13 0330  AST 24  ALT 16  ALKPHOS 76  BILITOT 1.0  PROT 6.3  ALBUMIN 2.9*    Recent Labs  12/10/13 1630 12/11/13 0330  WBC 6.7 5.9  HGB 10.1* 10.2*  HCT 31.3* 31.2*  MCV 94.8 93.7  PLT 256 254    Recent Labs  12/10/13 1630  TROPONINI <0.30   No components found with this basename: POCBNP,  No results found for this basename: HGBA1C,  in the last 72 hours   Radiology/Studies:  Ct Head Wo Contrast  12/11/2013   CLINICAL DATA:  Acute mental status change.  EXAM: CT HEAD WITHOUT CONTRAST  TECHNIQUE: Contiguous axial images were obtained from the base of the skull through the vertex without intravenous contrast.  COMPARISON:  MRI brain 11/22/2013.  CT head 11/22/2013.  FINDINGS: Diffuse cerebral atrophy. Ventricular dilatation likely  represent central atrophy. Patchy low-attenuation changes in the deep white matter consistent with small vessel ischemia. No mass effect or midline shift. No abnormal extra-axial fluid collections. Gray-white matter junctions are distinct. Basal cisterns are not effaced. No evidence of acute intracranial hemorrhage. No depressed skull fractures. Visualized paranasal sinuses and mastoid air cells are not opacified. Vascular calcifications. No significant interval change.  IMPRESSION: No acute intracranial abnormalities. Stable appearance of chronic atrophy and small vessel ischemic change.   Electronically Signed   By: Burman Nieves M.D.   On:  12/11/2013 23:30   Dg Chest Port 1 View  12/11/2013   CLINICAL DATA:  Shortness of breath and respiratory distress.  EXAM: PORTABLE CHEST - 1 VIEW  COMPARISON:  12/10/2013  FINDINGS: Cardiac enlargement with pulmonary vascular congestion. Infiltrates in the lung bases suggest early edema. No pleural effusion or pneumothorax suggested. Degenerative changes in the shoulders. Probably no significant change since previous study, allowing for differences in technique.  IMPRESSION: Cardiac enlargement with mild pulmonary vascular congestion and suggestion of basilar edema.   Electronically Signed   By: Burman Nieves M.D.   On: 12/11/2013 01:10   Dg Chest Port 1 View  12/01/2013   CLINICAL DATA:  Pulmonary edema.  EXAM: PORTABLE CHEST - 1 VIEW  COMPARISON:  11/29/2013.  FINDINGS: Left IJ line in stable position. Right carotid stent noted. Persistent prominent cardiomegaly is present. Pulmonary venous congestion and bilateral interstitial and alveolar infiltrates are noted consistent with persistent congestive heart failure and pulmonary edema. Superimposed pneumonia in the right lung base cannot be excluded. Small right pleural effusion cannot be excluded. No pneumothorax. No acute osseous abnormality.  IMPRESSION: Persistent unchanged congestive heart failure with pulmonary edema.   Electronically Signed   By: Maisie Fus  Register   On: 12/01/2013 07:40   Dg Chest Port 1 View  11/29/2013   CLINICAL DATA:  Assess edema.  EXAM: PORTABLE CHEST - 1 VIEW  COMPARISON:  11/28/2013  FINDINGS: Left jugular central line is near the junction of the superior vena cava and left innominate vein. Interstitial pulmonary edema has slightly improved. There continues to be increased densities at the right lung base. Heart size is mildly enlarged. Vascular stent on the right side of the neck.  IMPRESSION: Interstitial edema has minimally improved. There continues to be increased densities at the right lung base.   Electronically  Signed   By: Richarda Overlie M.D.   On: 11/29/2013 07:41   Dg Chest Port 1 View  11/28/2013   CLINICAL DATA:  Shortness of breath.  EXAM: PORTABLE CHEST - 1 VIEW  COMPARISON:  11/27/2013.  FINDINGS: Left IJ catheter in good anatomic position. Right carotid stent. Cardiomegaly with pulmonary venous congestion and prominent bilateral pulmonary interstitial and alveolar infiltrates. Small right pleural effusion may be present. These findings are consistent with congestive heart failure with pulmonary edema. Superimposed pneumonia cannot be excluded. No pneumothorax. Degenerative changes both shoulders.  IMPRESSION: 1. Congestive heart failure with bilateral pulmonary edema. Small right pleural effusion may be present. Superimposed pneumonia cannot be excluded. 2. Left IJ catheter in stable position.  Right carotid stent.   Electronically Signed   By: Maisie Fus  Register   On: 11/28/2013 07:24   Dg Chest Port 1 View  11/27/2013   CLINICAL DATA:  Line placement  EXAM: PORTABLE CHEST - 1 VIEW  COMPARISON:  11/25/2013; 11/24/2013; 11/22/2013; 11/25/2010  FINDINGS: Grossly unchanged enlarged cardiac silhouette and mediastinal contours. Interval placement of left jugular approach central venous catheter with  tip projected over the superior/ mid SVC. No pneumothorax. Bibasilar heterogeneous slightly nodular opacities are grossly unchanged, right greater than left. The pulmonary vasculature appears slightly indistinct, in particular within the right upper and mid lung. No definite pleural effusion. Grossly unchanged bones.  IMPRESSION: 1. Interval placement of left jugular approach under venous catheter with tip projected over to the superior/mid SVC. No pneumothorax. 2. Persistent findings of bibasilar opacities, right greater than left, atelectasis versus infiltrate. 3. Cardiomegaly and possible mild asymmetric pulmonary edema, right greater than left.   Electronically Signed   By: Simonne Come M.D.   On: 11/27/2013 14:03    Dg Chest Port 1 View  11/25/2013   CLINICAL DATA:  Respiratory failure  EXAM: PORTABLE CHEST - 1 VIEW  COMPARISON:  11/24/2013  FINDINGS: Cardiac shadow is stable. The endotracheal tube and nasogastric catheter been removed in the interval. Bibasilar atelectatic changes are noted. No focal confluent infiltrate is seen. No acute bony abnormality is noted.  IMPRESSION: Bibasilar atelectatic changes   Electronically Signed   By: Alcide Clever M.D.   On: 11/25/2013 07:29   Portable Chest Xray  11/24/2013   CLINICAL DATA:  Endotracheal tube placement.  EXAM: PORTABLE CHEST - 1 VIEW  COMPARISON:  Chest radiograph performed 11/23/2013  FINDINGS: The patient's endotracheal tube is seen ending 4 cm above the carina. Enteric tube is noted extending below the diaphragm.  Lung expansion is mildly decreased. Vascular congestion is noted, with bilateral central airspace opacities, raising concern for mild pulmonary edema. No definite pleural effusion or pneumothorax is seen.  The cardiomediastinal silhouette is mildly enlarged. Calcification is noted within the aortic arch. No acute osseous abnormalities are identified.  IMPRESSION: 1. Endotracheal tube seen ending 4 cm above the carina. 2. Vascular congestion and mild cardiomegaly, with bilateral central airspace opacities, raising concern for mild pulmonary edema. Previously noted right basilar opacity has improved.   Electronically Signed   By: Roanna Raider M.D.   On: 11/24/2013 02:34   Dg Chest Portable 1 View  11/23/2013   CLINICAL DATA:  Shortness of breath; preoperative chest radiograph.  EXAM: PORTABLE CHEST - 1 VIEW  COMPARISON:  Chest radiograph performed 11/22/2013  FINDINGS: The lungs are well-aerated. Mild bibasilar airspace opacities are slightly worsened from the prior study and may reflect atelectasis, mild pneumonia or possibly mild pulmonary edema, given underlying vascular congestion. No pleural effusion or pneumothorax is seen.  The  cardiomediastinal silhouette is mildly enlarged. No acute osseous abnormalities are seen.  IMPRESSION: Mild bibasilar airspace opacities are slightly worsened from the recent prior study and may reflect atelectasis, mild pneumonia or possibly mild pulmonary edema, given underlying vascular congestion and mild cardiomegaly.   Electronically Signed   By: Roanna Raider M.D.   On: 11/23/2013 21:51   Dg Abd Portable 1v  11/30/2013   CLINICAL DATA:  Abdominal distention.  Diabetes.  EXAM: PORTABLE ABDOMEN - 1 VIEW  COMPARISON:  Lumbar spine series 10 19 2009.  FINDINGS: Moderate gastric distention noted. Paucity of gas is noted distally. Gastric outlet obstruction cannot be excluded. Calcific density noted over the left kidney suggesting left nephrolithiasis. Degenerative changes lumbar spine and both hips.  IMPRESSION: Cannot exclude gastric outlet obstruction. These results will be called to the ordering clinician or representative by the Radiologist Assistant, and communication documented in the PACS Dashboard.   Electronically Signed   By: Maisie Fus  Register   On: 11/30/2013 12:11     Assessment and Plan  79 yo with history  of chronic atrial fibrillation, COPD, diastolic CHF, recent RLE embolic event s/p thrombectomy/angioplasty admitted with respiratory failure, suspect combination of acute/chronic diastolic CHF and acute exacerbation of COPD.   1. Acute respiratory failure: Suspect acute exacerbation COPD, possible viral bronchitis as instigator. Mucus plug removed 12/29. Remains on antibiotics, steroids, nebs. Add scheduled guaifenesin. 2. Acute on chronic diastolic CHF: likely driven by rapid AF and acute illness. Steroids could also be contributing to some volume retention. Continues to diurese on IV Lasix with stable Cr - continue this today. Add fluid restriction modifier to diet. 3. Atrial fibrillation with RVR: HR better controlled on amiodarone gtt. Continue warfarin and amiodarone gtt. Plan is to  try to transition back to po meds when pulmonary status is better and he has been diuresed more. Plan to eventually use bisoprolol + dilt CD. 4. Acute on chronic kidney injury: Creatinine stable. Following closely with diuresis.  5. AMS 12/29: ?etiology. CT head nonacute. ABG with normal PH but low PO2. Symptoms resolved. May have been brief metabolic encephalopathy. Signed, Ronie Spies PA-C  Patient seen with PA, agree with the above note.  He remains volume overloaded but has been diuresing reasonably.  Creatinine is lower today.  HR is under reasonable control now.  - Continue current IV Lasix, follow creatinine.  - Can stop amiodarone gtt today and will start diltiazem CD 240 mg daily for rate control.   Marca Ancona 12/13/2013 1:16 PM

## 2013-12-13 NOTE — Progress Notes (Signed)
[facility] Progress Note  TEAM 1 - Stepdown/ICU TEAM   [name] [name] ZOX:[id number] DOB: [birth year] DOA: 12/10/2013 PCP: No PCP Per Patient  Brief narrative: 79 y/o male with diastolic CHF, HTN, COPD, DM, A-fib with recent embolic event to right leg s/p right femoral embolectomy. He was discharged on Coumadin where previously he was only on ASA 81 mg. The hospital course was complicated by acute respiratory failure due to CHF exacerbation. He was discharged to a SNF on 12/23.   He was readmitted on 12/28 with acute respiratory failure suspected to be due to a COPD exacerbation and decompensated CHF.  He had to be transferred to the ICU on 12/29 early AM for respiratory distress possibly due to mucous plugging and required BiPAP, but not intubation.     HPI/Subjective: The patient is sitting up in a bedside chair.  He states he feels much better today.  He denies current chest pain nausea vomiting or shortness of breath when at rest.  Assessment/Plan:  Acute respiratory failure with hypoxia - multifactorial:   (a) acute diastolic CHF exacerbation - management per Cardiology - remains markedly volume overloaded   (b) COPD exacerbation - mucous plugging on 12/29 - Slow steroid taper -cont nebs and Robittusin DM and flutter valve -chronically on 2 L of O2 at home  (c) HCAP? Viral Bronchitis?  - started on Vanc and Zosyn on 12/28 by PCCM - cont to follow for now - will need a repeat CXR once diuresed to look for underlying PNA  Atrial fibrillation with RVR - currently controlled on Amiodarone - management per Cardiology - INR theraputic   AKI on CKD 3  - cont to follow with diuresis - crt is improving   Recent embolic event right LE - s/p embolectomy  Diabetes mellitus - non-insulin requiring - CBG elevated due to steroids - sliding scale - taper steroids   Hyperlipidemia  BPH - cont Hytrin  H/O CVA  Code Status: FULL Family Communication: with wife  and pt at bedside Disposition Plan: SNF- telemetry- PT/ OT  Consultants: Cardiology PCCM  Antibiotics: 12/28 vanc >>  12/28 zosyn >>  DVT prophylaxis: therapeutic on Coumadin  Objective: Blood pressure 115/68, pulse 100, temperature 98 F (36.7 C), temperature source Oral, resp. rate 20, height 6' (1.829 m), weight 94.484 kg (208 lb 4.8 oz), SpO2 98.00%.  Intake/Output Summary (Last 24 hours) at 12/13/13 1517 Last data filed at 12/13/13 1456  Gross per 24 hour  Intake 1177.6 ml  Output   3300 ml  Net -2122.4 ml   Exam: General: AAO x 3, no distress while up in chair Lungs: b/l ronchi and wheeze with basilar crackles- on 1 L O2 with pulse ox of 96% Cardiovascular: Irregular rate and rhythm without murmur - + JVD Abdomen: Nontender, nondistended, soft, bowel sounds positive, no rebound, no ascites, no appreciable mass Extremities: No significant cyanosis, clubbing;  3+ doughy B LE edema  Data Reviewed: Basic Metabolic Panel:  Recent Labs Lab 12/10/13 1630 12/11/13 0330 12/12/13 0330 12/13/13 0534  NA 131* 134* 138 142  K 4.4 4.6 4.6 3.6*  CL 95* 95* 96 97  CO2 30 29 25 29   GLUCOSE 73 126* 275* 223*  BUN 32* 32* 37* 39*  CREATININE 1.59* 1.70* 1.65* 1.50*  CALCIUM 9.0 9.0 8.8 8.2*  MG  --  1.9  --   --    Liver Function Tests:  Recent Labs Lab 12/11/13 0330  AST 24  ALT 16  ALKPHOS 76  BILITOT 1.0  PROT 6.3  ALBUMIN 2.9*   CBC:  Recent Labs Lab 12/10/13 1630 12/11/13 0330  WBC 6.7 5.9  HGB 10.1* 10.2*  HCT 31.3* 31.2*  MCV 94.8 93.7  PLT 256 254   Cardiac Enzymes:  Recent Labs Lab 12/10/13 1630  TROPONINI <0.30   BNP (last 3 results)  Recent Labs  12/10/13 1630  PROBNP 5532.0*   CBG:  Recent Labs Lab 12/12/13 2101 12/13/13 0038 12/13/13 0526 12/13/13 0800 12/13/13 1103  GLUCAP 214* 219* 202* 197* 204*    Recent Results (from the past 240 hour(s))  MRSA PCR SCREENING     Status: None   Collection Time    12/10/13  12:09 PM      Result Value Range Status   MRSA by PCR NEGATIVE  NEGATIVE Final   Comment:            The GeneXpert MRSA Assay (FDA     approved for NASAL specimens     only), is one component of a     comprehensive MRSA colonization     surveillance program. It is not     intended to diagnose MRSA     infection nor to guide or     monitor treatment for     MRSA infections.  URINE CULTURE     Status: None   Collection Time    12/10/13  6:02 PM      Result Value Range Status   Specimen Description URINE, CATHETERIZED   Final   Special Requests NONE   Final   Culture  Setup Time     Final   Value: 12/11/2013 01:10     Performed at [facility] Count     Final   Value: 50,000 COLONIES/ML     Performed at [facility]   Culture     Final   Value: ESCHERICHIA COLI     Performed at [facility]   Report Status 12/13/2013 FINAL   Final   Organism ID, Bacteria ESCHERICHIA COLI   Final     Studies:  Recent x-ray studies have been reviewed in detail by the Attending Physician  Scheduled Meds:  Scheduled Meds: . antiseptic oral rinse  15 mL Mouth Rinse q12n4p  . atorvastatin  10 mg Oral Daily  . chlorhexidine  15 mL Mouth Rinse BID  . diltiazem  240 mg Oral Daily  . famotidine  20 mg Oral QHS  . furosemide  80 mg Intravenous Q8H  . guaiFENesin  600 mg Oral BID  . insulin aspart  0-9 Units Subcutaneous Q4H  . levalbuterol  0.63 mg Nebulization TID  . methylPREDNISolone (SOLU-MEDROL) injection  60 mg Intravenous Q12H  . piperacillin-tazobactam (ZOSYN)  IV  3.375 g Intravenous Q8H  . potassium chloride SA  10 mEq Oral Daily  . sodium chloride  3 mL Intravenous Q12H  . sodium chloride  3 mL Intravenous Q12H  . vancomycin  750 mg Intravenous Q12H  . warfarin  1 mg Oral ONCE-1800  . Warfarin - Pharmacist Dosing Inpatient   Does not apply q1800    Time spent on care of this patient: 35 min   MCCLUNG,JEFFREY T, MD  [facility] Office   [phone] Pager - Text Page per Loretha Stapler as per below:  On-Call/Text Page:      [email or web address]      password [email or web address]  If 7PM-7AM, please contact night-coverage [email or web address] Password [email or web address] 12/13/2013, 3:17 PM   LOS:  3 days

## 2013-12-13 NOTE — Progress Notes (Signed)
Pt. With 8 beat run of VTach. No s/s of distress noted. Pt. Denies pain . VSS. Text page sent to On call for [facility]. RN will continue to monitor pt. For changes in condtion. Mayci Haning, Cheryll Dessert

## 2013-12-14 DIAGNOSIS — I82409 Acute embolism and thrombosis of unspecified deep veins of unspecified lower extremity: Secondary | ICD-10-CM

## 2013-12-14 HISTORY — DX: Acute embolism and thrombosis of unspecified deep veins of unspecified lower extremity: I82.409

## 2013-12-14 LAB — CBC
HCT: 33.9 % — ABNORMAL LOW (ref 39.0–52.0)
Hemoglobin: 11 g/dL — ABNORMAL LOW (ref 13.0–17.0)
MCH: 30.4 pg (ref 26.0–34.0)
MCHC: 32.4 g/dL (ref 30.0–36.0)
MCV: 93.6 fL (ref 78.0–100.0)
Platelets: 220 10*3/uL (ref 150–400)
RBC: 3.62 MIL/uL — ABNORMAL LOW (ref 4.22–5.81)
RDW: 14.1 % (ref 11.5–15.5)
WBC: 7.3 10*3/uL (ref 4.0–10.5)

## 2013-12-14 LAB — BASIC METABOLIC PANEL
BUN: 42 mg/dL — ABNORMAL HIGH (ref 6–23)
CO2: 29 mEq/L (ref 19–32)
Calcium: 8.4 mg/dL (ref 8.4–10.5)
Chloride: 94 mEq/L — ABNORMAL LOW (ref 96–112)
Creatinine, Ser: 1.62 mg/dL — ABNORMAL HIGH (ref 0.50–1.35)
GFR calc Af Amer: 45 mL/min — ABNORMAL LOW (ref >90)
GFR calc non Af Amer: 38 mL/min — ABNORMAL LOW (ref >90)
Glucose, Bld: 271 mg/dL — ABNORMAL HIGH (ref 70–99)
Potassium: 3.8 mEq/L (ref 3.7–5.3)
Sodium: 140 mEq/L (ref 137–147)

## 2013-12-14 LAB — GLUCOSE, CAPILLARY
GLUCOSE-CAPILLARY: 284 mg/dL — AB (ref 70–99)
Glucose-Capillary: 244 mg/dL — ABNORMAL HIGH (ref 70–99)
Glucose-Capillary: 219 mg/dL — ABNORMAL HIGH (ref 70–99)
Glucose-Capillary: 234 mg/dL — ABNORMAL HIGH (ref 70–99)

## 2013-12-14 LAB — PROTIME-INR
INR: 2.15 — ABNORMAL HIGH (ref 0.00–1.49)
Prothrombin Time: 23.3 seconds — ABNORMAL HIGH (ref 11.6–15.2)

## 2013-12-14 MED ORDER — PREDNISONE 20 MG PO TABS
20.0000 mg | ORAL_TABLET | Freq: Two times a day (BID) | ORAL | Status: DC
  Administered 2013-12-14 – 2013-12-15 (×3): 20 mg via ORAL
  Filled 2013-12-14 (×4): qty 1

## 2013-12-14 MED ORDER — WARFARIN SODIUM 2.5 MG PO TABS
2.5000 mg | ORAL_TABLET | Freq: Once | ORAL | Status: AC
  Administered 2013-12-14: 19:00:00 2.5 mg via ORAL
  Filled 2013-12-14: qty 1

## 2013-12-14 NOTE — Progress Notes (Signed)
ANTICOAGULATION CONSULT NOTE - Follow-up  Pharmacy Consult for warfarin Indication: atrial fibrillation  No Known Allergies  Patient Measurements: Height: 6' (182.9 cm) Weight: 207 lb 6.4 oz (94.076 kg) IBW/kg (Calculated) : 77.6  Vital Signs: Temp: 97 F (36.1 C) (01/01 0406) Temp src: Axillary (01/01 0406) BP: 135/84 mmHg (01/01 0406) Pulse Rate: 113 (01/01 0406)  Labs:  Recent Labs  12/12/13 0330 12/13/13 0534 12/14/13 0530  HGB  --   --  11.0*  HCT  --   --  33.9*  PLT  --   --  220  LABPROT 24.2* 29.2* 23.3*  INR 2.26* 2.89* 2.15*  CREATININE 1.65* 1.50*  --     Estimated Creatinine Clearance: 46.8 ml/min (by C-G formula based on Cr of 1.5).  Assessment: 82 YOM continues on warfarin for afib. INR 2.15 this AM from 2.89. Note patient missed a dose on 12/29 as he was lethargic; amiodarone gtt now d/ced (12/29-12/31) on diltiazem for rate control. CBC stable, no bleeding noted.   Coumadin PTA 2.5mg  daily  Goal of Therapy:  INR 2-3 Monitor platelets by anticoagulation protocol: Yes  Plan:  - Will resume patient's home dose, Coumadin 2.5mg  x 1 tonight - Daily PT/INR - Coumadin education has been completed   Fiza Nation B. Artelia Larocheung, PharmD Clinical Pharmacist - Resident Pager: [phone] Phone: [phone] 12/14/2013 7:52 AM

## 2013-12-14 NOTE — Progress Notes (Signed)
PROGRESS NOTE  [name] [name] ZOX:[id number] DOB: [birth year] DOA: 12/10/2013 PCP: No PCP Per Patient  Assessment/Plan: Acute respiratory failure with hypoxia  - multifactorial likely due to COPD exacerbation, acute diastolic CHF exacerbation, HCAP - management per Cardiology - remains markedly volume overloaded. On Lasix 80 TID. Net negative 7.2 L. - Slow steroid taper, solu-medrol discontinued last night, transition to prednisone 20 BID.  - cont nebs and Robittusin DM and flutter valve  - chronically on 2 L of O2 at home  - started on Vanc and Zosyn on 12/28 by PCCM; likely de-escalate in 1-2 days to Levofloxacin.  - cont to follow for now - will need a repeat CXR once diuresed to look for underlying PNA  Atrial fibrillation with RVR  - currently controlled on Amiodarone  - management per Cardiology  - INR theraputic  AKI on CKD 3 - cont to follow with diuresis - crt stable, 1.5>1.62 Recent embolic event right LE - s/p embolectomy  Diabetes mellitus - non-insulin requiring - CBG elevated due to steroids  - sliding scale - taper steroids  Hyperlipidemia  BPH - cont Hytrin  H/O CVA  Diet: carb Fluids: none DVT Prophylaxis: on Coumadin  Code Status: Full Family Communication: none  Disposition Plan: SNF when ready  Consultants:  Cardiology  PCCM  Procedures:  none   Antibiotics 12/28 vanc >>  12/28 zosyn >>  HPI/Subjective: - feeling better  Objective: Filed Vitals:   12/13/13 1410 12/13/13 1947 12/13/13 1950 12/14/13 0406  BP: 115/68  123/78 135/84  Pulse: 100  111 113  Temp: 98 F (36.7 C)  97.3 F (36.3 C) 97 F (36.1 C)  TempSrc: Oral  Axillary Axillary  Resp: 20  20 18   Height:      Weight:    94.076 kg (207 lb 6.4 oz)  SpO2: 98% 98% 98% 94%    Intake/Output Summary (Last 24 hours) at 12/14/13 0817 Last data filed at 12/14/13 0503  Gross per 24 hour  Intake   1270 ml  Output   2500 ml  Net  -1230 ml   Filed Weights   12/12/13 1900  12/13/13 0542 12/14/13 0406  Weight: 94.303 kg (207 lb 14.4 oz) 94.484 kg (208 lb 4.8 oz) 94.076 kg (207 lb 6.4 oz)   Exam:  General:  NAD  Cardiovascular: irregular, JVD present  Respiratory: good air movement, clear to auscultation throughout, no wheezing, ronchi or rales  Abdomen: soft, not tender to palpation, positive bowel sounds  MSK: 3+ peripheral edema  Neuro: CN 2-12 grossly intact, MS 5/5 in all 4  Data Reviewed: Basic Metabolic Panel:  Recent Labs Lab 12/10/13 1630 12/11/13 0330 12/12/13 0330 12/13/13 0534  NA 131* 134* 138 142  K 4.4 4.6 4.6 3.6*  CL 95* 95* 96 97  CO2 30 29 25 29   GLUCOSE 73 126* 275* 223*  BUN 32* 32* 37* 39*  CREATININE 1.59* 1.70* 1.65* 1.50*  CALCIUM 9.0 9.0 8.8 8.2*  MG  --  1.9  --   --    Liver Function Tests:  Recent Labs Lab 12/11/13 0330  AST 24  ALT 16  ALKPHOS 76  BILITOT 1.0  PROT 6.3  ALBUMIN 2.9*   CBC:  Recent Labs Lab 12/10/13 1630 12/11/13 0330 12/14/13 0530  WBC 6.7 5.9 7.3  HGB 10.1* 10.2* 11.0*  HCT 31.3* 31.2* 33.9*  MCV 94.8 93.7 93.6  PLT 256 254 220   Cardiac Enzymes:  Recent Labs Lab 12/10/13 1630  TROPONINI <  0.30   BNP (last 3 results)  Recent Labs  12/10/13 1630  PROBNP 5532.0*   CBG:  Recent Labs Lab 12/13/13 0526 12/13/13 0800 12/13/13 1103 12/13/13 1953 12/14/13 0637  GLUCAP 202* 197* 204* 239* 244*    Recent Results (from the past 240 hour(s))  MRSA PCR SCREENING     Status: None   Collection Time    12/10/13 12:09 PM      Result Value Range Status   MRSA by PCR NEGATIVE  NEGATIVE Final   Comment:            The GeneXpert MRSA Assay (FDA     approved for NASAL specimens     only), is one component of a     comprehensive MRSA colonization     surveillance program. It is not     intended to diagnose MRSA     infection nor to guide or     monitor treatment for     MRSA infections.  URINE CULTURE     Status: None   Collection Time    12/10/13  6:02 PM       Result Value Range Status   Specimen Description URINE, CATHETERIZED   Final   Special Requests NONE   Final   Culture  Setup Time     Final   Value: 12/11/2013 01:10     [facility]formed [facility]   [place] Count     Final   Value: 50,000 COLONIES/ML     [facility]formed at [facility]   Culture     Final   Value: ESCHERICHIA COLI     [facility]formed at [facility]   Report Status 12/13/2013 FINAL   Final   Organism ID, Bacteria ESCHERICHIA COLI   Final   Studies: No results found.  Scheduled Meds: . antiseptic oral rinse  15 mL Mouth Rinse q12n4p  . atorvastatin  10 mg Oral Daily  . chlorhexidine  15 mL Mouth Rinse BID  . diltiazem  240 mg Oral Daily  . famotidine  20 mg Oral QHS  . furosemide  80 mg Intravenous Q8H  . guaiFENesin  600 mg Oral BID  . insulin aspart  0-9 Units Subcutaneous TID WC  . levalbuterol  0.63 mg Nebulization TID  . piperacillin-tazobactam (ZOSYN)  IV  3.375 g Intravenous Q8H  . potassium chloride  40 mEq Oral Daily  . vancomycin  750 mg Intravenous Q12H  . warfarin  2.5 mg Oral ONCE-1800  . Warfarin - Pharmacist Dosing Inpatient   Does not apply q1800   Continuous Infusions:   Principal Problem:   Acute respiratory failure with hypoxia Active Problems:   Atrial fibrillation   H/O: CVA (cerebrovascular accident)   COPD (chronic obstructive pulmonary disease)   Anasarca   Acute on chronic diastolic heart failure   Acute renal failure   Anemia   UTI (urinary tract infection)   Diabetes mellitus with hypoglycemia   Acute on chronic diastolic CHF (congestive heart failure)   Peripheral vascular disease   Acute on chronic diastolic congestive heart failure  Time spent: 35  Pamella Pertostin Gherghe, MD [facility] Pager [phone]. If 7 PM - 7 AM, please contact night-coverage at [email or web address], password [email or web address] 12/14/2013, 8:17 AM  LOS: 4 days

## 2013-12-14 NOTE — Progress Notes (Signed)
Patient ID: Kewan Mcnease, male   DOB: 18-Feb-1931, 80 y.o.   MRN: [id number]    Primary cardiologist:  Subjective:    Still with SOB this morning, mildly improved from yesterday   Objective:   Temp:  [97 F (36.1 C)-98 F (36.7 C)] 97 F (36.1 C) (01/01 0406) Pulse Rate:  [100-113] 113 (01/01 0406) Resp:  [18-20] 18 (01/01 0406) BP: (115-135)/(68-84) 135/84 mmHg (01/01 0406) SpO2:  [94 %-100 %] 94 % (01/01 0406) Weight:  [207 lb 6.4 oz (94.076 kg)] 207 lb 6.4 oz (94.076 kg) (01/01 0406) Last BM Date: 12/12/13  Filed Weights   12/12/13 1900 12/13/13 0542 12/14/13 0406  Weight: 207 lb 14.4 oz (94.303 kg) 208 lb 4.8 oz (94.484 kg) 207 lb 6.4 oz (94.076 kg)    Intake/Output Summary (Last 24 hours) at 12/14/13 0759 Last data filed at 12/14/13 0503  Gross per 24 hour  Intake   1270 ml  Output   2500 ml  Net  -1230 ml    Telemetry: afib rates 90s to 130s  Exam:  General:NAD  Resp: significant bilateral expiratory wheezing throughout  Cardiac: irreg, no m/r/g, JVD to angle of jaw  WU:JWJX, NT, ND  MSK: extremities warm, 2-3+ bilateral edema  Neuro: no focal deficits  Psych: appropriate affect  Lab Results:  Basic Metabolic Panel:  Recent Labs Lab 12/11/13 0330 12/12/13 0330 12/13/13 0534  NA 134* 138 142  K 4.6 4.6 3.6*  CL 95* 96 97  CO2 29 25 29   GLUCOSE 126* 275* 223*  BUN 32* 37* 39*  CREATININE 1.70* 1.65* 1.50*  CALCIUM 9.0 8.8 8.2*  MG 1.9  --   --     Liver Function Tests:  Recent Labs Lab 12/11/13 0330  AST 24  ALT 16  ALKPHOS 76  BILITOT 1.0  PROT 6.3  ALBUMIN 2.9*    CBC:  Recent Labs Lab 12/10/13 1630 12/11/13 0330 12/14/13 0530  WBC 6.7 5.9 7.3  HGB 10.1* 10.2* 11.0*  HCT 31.3* 31.2* 33.9*  MCV 94.8 93.7 93.6  PLT 256 254 220    Cardiac Enzymes:  Recent Labs Lab 12/10/13 1630  TROPONINI <0.30    BNP:  Recent Labs  12/10/13 1630  PROBNP 5532.0*    Coagulation:  Recent Labs Lab 12/12/13 0330  12/13/13 0534 12/14/13 0530  INR 2.26* 2.89* 2.15*    ECG:   Medications:   Scheduled Medications: . antiseptic oral rinse  15 mL Mouth Rinse q12n4p  . atorvastatin  10 mg Oral Daily  . chlorhexidine  15 mL Mouth Rinse BID  . diltiazem  240 mg Oral Daily  . famotidine  20 mg Oral QHS  . furosemide  80 mg Intravenous Q8H  . guaiFENesin  600 mg Oral BID  . insulin aspart  0-9 Units Subcutaneous TID WC  . levalbuterol  0.63 mg Nebulization TID  . piperacillin-tazobactam (ZOSYN)  IV  3.375 g Intravenous Q8H  . potassium chloride  40 mEq Oral Daily  . vancomycin  750 mg Intravenous Q12H  . warfarin  2.5 mg Oral ONCE-1800  . Warfarin - Pharmacist Dosing Inpatient   Does not apply q1800     Infusions:     PRN Medications:  acetaminophen, acetaminophen, dextrose, guaiFENesin-dextromethorphan, morphine injection, ondansetron (ZOFRAN) IV, ondansetron, oxyCODONE, zolpidem     Assessment/Plan   80 year old male with afib, prior lower extremity embolic event with prior right femoral embolectomy, chronic diastolic heart failure admitted with acute on chronic diastolic heart failure,  afib with RVR, COPD exacerbation with possible HCAP, and AKI.   1. Acute on chronic diastolic heart failure - negative 1.2 liters yesterday on lasix 80 mg IV q 8 hrs, labs are not back today but Cr has been trending down overall - still has evidence of significant volume overload on exam, continue current diuretic regimen.   2. Afib - amio gtt stopped, transitioned to oral diltiazem. Appears first dose of dilt was not given yesterday based on MAR, scheduled for today at 10AM. Did have some elevated rates overnight, will follow rates after receives dilt this AM - continue anticoagulation.    Dina RichJonathan Branch, M.D., F.A.C.C.

## 2013-12-15 LAB — GLUCOSE, CAPILLARY
GLUCOSE-CAPILLARY: 259 mg/dL — AB (ref 70–99)
GLUCOSE-CAPILLARY: 196 mg/dL — AB (ref 70–99)
Glucose-Capillary: 234 mg/dL — ABNORMAL HIGH (ref 70–99)
Glucose-Capillary: 269 mg/dL — ABNORMAL HIGH (ref 70–99)

## 2013-12-15 LAB — BASIC METABOLIC PANEL
BUN: 40 mg/dL — ABNORMAL HIGH (ref 6–23)
CALCIUM: 8.6 mg/dL (ref 8.4–10.5)
CHLORIDE: 96 meq/L (ref 96–112)
CO2: 31 mEq/L (ref 19–32)
CREATININE: 1.54 mg/dL — AB (ref 0.50–1.35)
GFR calc Af Amer: 47 mL/min — ABNORMAL LOW (ref >90)
GFR, EST NON AFRICAN AMERICAN: 41 mL/min — AB (ref >90)
Glucose, Bld: 254 mg/dL — ABNORMAL HIGH (ref 70–99)
POTASSIUM: 4.1 meq/L (ref 3.7–5.3)
Sodium: 142 mEq/L (ref 137–147)

## 2013-12-15 LAB — PROTIME-INR
INR: 1.76 — ABNORMAL HIGH (ref 0.00–1.49)
PROTHROMBIN TIME: 20 s — AB (ref 11.6–15.2)

## 2013-12-15 MED ORDER — OXYMETAZOLINE HCL 0.05 % NA SOLN
1.0000 | Freq: Two times a day (BID) | NASAL | Status: DC
  Administered 2013-12-15 – 2013-12-20 (×10): 1 via NASAL
  Filled 2013-12-15 (×2): qty 15

## 2013-12-15 MED ORDER — LIDOCAINE HCL 4 % EX SOLN
Freq: Once | CUTANEOUS | Status: AC
  Administered 2013-12-15: 50 mL via TOPICAL
  Filled 2013-12-15: qty 50

## 2013-12-15 MED ORDER — WARFARIN SODIUM 2.5 MG PO TABS
2.5000 mg | ORAL_TABLET | Freq: Once | ORAL | Status: AC
  Administered 2013-12-15: 18:00:00 2.5 mg via ORAL
  Filled 2013-12-15: qty 1

## 2013-12-15 MED ORDER — SILVER NITRATE-POT NITRATE 75-25 % EX MISC
1.0000 | Freq: Once | CUTANEOUS | Status: AC
  Administered 2013-12-15: 22:00:00 1 via TOPICAL
  Filled 2013-12-15: qty 1

## 2013-12-15 MED ORDER — LEVOFLOXACIN IN D5W 750 MG/150ML IV SOLN
750.0000 mg | INTRAVENOUS | Status: DC
  Administered 2013-12-16: 750 mg via INTRAVENOUS
  Filled 2013-12-15 (×2): qty 150

## 2013-12-15 MED ORDER — PREDNISONE 10 MG PO TABS
10.0000 mg | ORAL_TABLET | Freq: Two times a day (BID) | ORAL | Status: DC
  Administered 2013-12-16 – 2013-12-20 (×10): 10 mg via ORAL
  Filled 2013-12-15 (×11): qty 1

## 2013-12-15 MED ORDER — SALINE SPRAY 0.65 % NA SOLN
4.0000 | Freq: Every day | NASAL | Status: DC
  Administered 2013-12-16 – 2013-12-20 (×29): 4 via NASAL
  Filled 2013-12-15 (×2): qty 44

## 2013-12-15 NOTE — Progress Notes (Signed)
PROGRESS NOTE  [name] GoodyBobby [name] WUJ:[id number] DOB: Apr 15, 1931 DOA: 12/10/2013 PCP: No PCP Per Patient  Assessment/Plan: Acute respiratory failure with hypoxia  - multifactorial likely due to COPD exacerbation, acute diastolic CHF exacerbation, HCAP - management per Cardiology - remains markedly volume overloaded. On Lasix 80 TID. Net negative 8.7L - Slow steroid taper, solu-medrol discontinued last night, transition to prednisone 20 BID and change to 10 BID tomorrow.   - cont nebs and Robittusin DM and flutter valve  - chronically on 2 L of O2 at home  - started on Vanc and Zosyn on 12/28 by PCCM; de-escalate to Levofloxacin 1/2 - cont to follow for now - will need a repeat CXR once diuresed to look for underlying PNA  Atrial fibrillation with RVR  - currently controlled on Amiodarone  - management per Cardiology  - INR theraputic  AKI on CKD 3 - cont to follow with diuresis - crt stable, 1.5>1.62>1.54 Recent embolic event right LE - s/p embolectomy  Diabetes mellitus - non-insulin requiring - CBG elevated due to steroids  - sliding scale  - taper steroids, 10 BID tomorrow.  Hyperlipidemia  BPH - cont Hytrin  H/O CVA  Diet: carb Fluids: none DVT Prophylaxis: on Coumadin  Code Status: Full Family Communication: none  Disposition Plan: SNF when ready  Consultants:  Cardiology  PCCM  Procedures:  none   Antibiotics 12/28 vanc >> 1/2 12/28 zosyn >> 1/2 1/2 Levofloxacin > plan until 1/5  HPI/Subjective: - feeling better  Objective: Filed Vitals:   12/15/13 0858 12/15/13 0918 12/15/13 1317 12/15/13 1428  BP: 122/73  127/73   Pulse:   85   Temp: 97.8 F (36.6 C)  97.3 F (36.3 C)   TempSrc: Oral  Oral   Resp: 20  20   Height:      Weight:      SpO2: 100% 99% 96% 97%    Intake/Output Summary (Last 24 hours) at 12/15/13 1735 Last data filed at 12/15/13 1724  Gross per 24 hour  Intake   1280 ml  Output   3550 ml  Net  -2270 ml   Filed Weights   12/13/13 0542 12/14/13 0406 12/15/13 0534  Weight: 94.484 kg (208 lb 4.8 oz) 94.076 kg (207 lb 6.4 oz) 91.989 kg (202 lb 12.8 oz)   Exam:  General:  NAD  Cardiovascular: irregular, JVD present  Respiratory: good air movement, clear to auscultation throughout, no wheezing, ronchi or rales  Abdomen: soft, not tender to palpation, positive bowel sounds  MSK: 3+ peripheral edema  Neuro: CN 2-12 grossly intact, MS 5/5 in all 4  Data Reviewed: Basic Metabolic Panel:  Recent Labs Lab 12/11/13 0330 12/12/13 0330 12/13/13 0534 12/14/13 0530 12/15/13 0543  NA 134* 138 142 140 142  K 4.6 4.6 3.6* 3.8 4.1  CL 95* 96 97 94* 96  CO2 29 25 29 29 31   GLUCOSE 126* 275* 223* 271* 254*  BUN 32* 37* 39* 42* 40*  CREATININE 1.70* 1.65* 1.50* 1.62* 1.54*  CALCIUM 9.0 8.8 8.2* 8.4 8.6  MG 1.9  --   --   --   --    Liver Function Tests:  Recent Labs Lab 12/11/13 0330  AST 24  ALT 16  ALKPHOS 76  BILITOT 1.0  PROT 6.3  ALBUMIN 2.9*   CBC:  Recent Labs Lab 12/10/13 1630 12/11/13 0330 12/14/13 0530  WBC 6.7 5.9 7.3  HGB 10.1* 10.2* 11.0*  HCT 31.3* 31.2* 33.9*  MCV 94.8 93.7 93.6  PLT 256 254 220   Cardiac Enzymes:  Recent Labs Lab 12/10/13 1630  TROPONINI <0.30   BNP (last 3 results)  Recent Labs  12/10/13 1630  PROBNP 5532.0*   CBG:  Recent Labs Lab 12/14/13 1603 12/14/13 2100 12/15/13 0639 12/15/13 1101 12/15/13 1615  GLUCAP 219* 234* 234* 269* 259*    Recent Results (from the past 240 hour(s))  MRSA PCR SCREENING     Status: None   Collection Time    12/10/13 12:09 PM      Result Value Range Status   MRSA by PCR NEGATIVE  NEGATIVE Final   Comment:            The GeneXpert MRSA Assay (FDA     approved for NASAL specimens     only), is one component of a     comprehensive MRSA colonization     surveillance program. It is not     intended to diagnose MRSA     infection nor to guide or     monitor treatment for     MRSA infections.  URINE  CULTURE     Status: None   Collection Time    12/10/13  6:02 PM      Result Value Range Status   Specimen Description URINE, CATHETERIZED   Final   Special Requests NONE   Final   Culture  Setup Time     Final   Value: 12/11/2013 01:10     Performed at [facility] Count     Final   Value: 50,000 COLONIES/ML     Performed at [facility]   Culture     Final   Value: ESCHERICHIA COLI     Performed at [facility]   Report Status 12/13/2013 FINAL   Final   Organism ID, Bacteria ESCHERICHIA COLI   Final   Studies: No results found.  Scheduled Meds: . antiseptic oral rinse  15 mL Mouth Rinse q12n4p  . atorvastatin  10 mg Oral Daily  . diltiazem  240 mg Oral Daily  . famotidine  20 mg Oral QHS  . furosemide  80 mg Intravenous Q8H  . guaiFENesin  600 mg Oral BID  . insulin aspart  0-9 Units Subcutaneous TID WC  . levalbuterol  0.63 mg Nebulization TID  . piperacillin-tazobactam (ZOSYN)  IV  3.375 g Intravenous Q8H  . potassium chloride  40 mEq Oral Daily  . predniSONE  20 mg Oral BID WC  . vancomycin  750 mg Intravenous Q12H  . warfarin  2.5 mg Oral ONCE-1800  . Warfarin - Pharmacist Dosing Inpatient   Does not apply q1800   Continuous Infusions:   Principal Problem:   Acute respiratory failure with hypoxia Active Problems:   Atrial fibrillation   H/O: CVA (cerebrovascular accident)   COPD (chronic obstructive pulmonary disease)   Anasarca   Acute on chronic diastolic heart failure   Acute renal failure   Anemia   UTI (urinary tract infection)   Diabetes mellitus with hypoglycemia   Acute on chronic diastolic CHF (congestive heart failure)   Peripheral vascular disease   Acute on chronic diastolic congestive heart failure  Time spent: 35  Pamella Pert, MD [facility] Pager [phone]. If 7 PM - 7 AM, please contact night-coverage at [email or web address], password [facility] 12/15/2013, 5:35 PM  LOS: 5 days

## 2013-12-15 NOTE — Progress Notes (Signed)
ANTICOAGULATION CONSULT NOTE - Follow-up  Pharmacy Consult for warfarin Indication: atrial fibrillation  No Known Allergies  Patient Measurements: Height: 6' (182.9 cm) Weight: 202 lb 12.8 oz (91.989 kg) (bed scale) IBW/kg (Calculated) : 77.6  Vital Signs: Temp: 97.8 F (36.6 C) (01/02 0858) Temp src: Oral (01/02 0858) BP: 122/73 mmHg (01/02 0858) Pulse Rate: 85 (01/02 0534)  Labs:  Recent Labs  12/13/13 0534 12/14/13 0530 12/15/13 0543  HGB  --  11.0*  --   HCT  --  33.9*  --   PLT  --  220  --   LABPROT 29.2* 23.3* 20.0*  INR 2.89* 2.15* 1.76*  CREATININE 1.50* 1.62* 1.54*    Estimated Creatinine Clearance: 42 ml/min (by C-G formula based on Cr of 1.54).  Assessment: 82 YOM continues on warfarin for afib. INR 1.76 this AM from 2.15- patient has been changed from amiodarone to diltiazem for AFib rate control. Diltiazem does not have the same potentiating effect on INR as amiodarone. Note patient may be switched to levofloxacin which also can potentiate INR. CBC stable, no bleeding noted. Warfarin PTA 2.5mg  daily  Goal of Therapy:  INR 2-3 Monitor platelets by anticoagulation protocol: Yes  Plan:  1. Warfarin 2.5mg  x 1 tonight 2. Daily PT/INR  Woods Gangemi D. Sade Hollon, PharmD, BCPS Clinical Pharmacist Pager: [phone] 12/15/2013 10:23 AM

## 2013-12-15 NOTE — Clinical Social Work Psychosocial (Signed)
Clinical Social Work Department BRIEF PSYCHOSOCIAL ASSESSMENT 12/15/2013  Patient:  [name] [name],[name] [name]     Account Number:  [id number]     Admit date:  12/10/2013  Clinical Social Worker:  Tiburcio PeaROWDER,DONNA, LCSWA  Date/Time:  12/14/2013 06:26 PM  Referred by:  Physician  Date Referred:  12/13/2013 Referred for  SNF Placement   Other Referral:   Interview type:  Other - See comment Other interview type:   Pt. and family    PSYCHOSOCIAL DATA Living Status:  FACILITY Admitted from facility:  [facility], [place] Level of care:  Skilled Nursing Facility Primary support name:  [name] [name]  [phone] Primary support relationship to patient:  SPOUSE Degree of support available:   Strong support    CURRENT CONCERNS Current Concerns  Other - See comment   Other Concerns:   Return to SNF for rehab    SOCIAL WORK ASSESSMENT / PLAN 80 year old male- resident of [facility] where he was admitted on 12/05/13.  Patient returned to hospital on 12/10/13.  Pt. has a very supportive wife who is actively involved in care. Pt. has been married for 58 years. Pt. has 2 living children who live in [place] and  1 deceased child.Pt.'s wife shared with CSW that her husband's insurance has changed as of 12/13/13. Pt now has an active [employer] policy. CSW passed the information on to the CM and she will follow up with pt. and his wife.[facility],  it is the plan for pt. to return to [facility] to continue rehab and then return home. Pt. and family expressed their appreciation of the care they have been receiving from the staff.   Assessment/plan status:  Psychosocial Support/Ongoing Assessment of Needs Other assessment/ plan:   Information/referral to community resources:   None at this time.    PATIENT'S/FAMILY'S RESPONSE TO PLAN OF CARE: Pt. and family appreciative of CSW visit they understand the role CSW play in care. It is the plan for pt. to return to [facility]  upon discharge. CSW will continue to monitor and assess needs as they arise.   [name] [name], [place]CSWA [phone]/[phone]/Donna Jaci Lazierrowder [phone]

## 2013-12-15 NOTE — Progress Notes (Signed)
ANTIBIOTIC CONSULT NOTE - FOLLOW UP  Pharmacy Consult for Levaquin Indication: COPD exacerbation/ HCAP coverage  No Known Allergies  Patient Measurements: Height: 6' (182.9 cm) Weight: 202 lb 12.8 oz (91.989 kg) (bed scale) IBW/kg (Calculated) : 77.6  Vital Signs: Temp: 97.3 F (36.3 C) (01/02 1317) Temp src: Oral (01/02 1317) BP: 127/73 mmHg (01/02 1317) Pulse Rate: 85 (01/02 1317) Intake/Output from previous day: 01/01 0701 - 01/02 0700 In: 1100 [P.O.:700; IV Piggyback:400] Out: 3150 [Urine:3150] Intake/Output from this shift: Total I/O In: 840 [P.O.:840] Out: 1400 [Urine:1400]   Labs:  Recent Labs  12/13/13 0534 12/14/13 0530 12/15/13 0543  WBC  --  7.3  --   HGB  --  11.0*  --   PLT  --  220  --   CREATININE 1.50* 1.62* 1.54*   Estimated Creatinine Clearance: 42 ml/min (by C-G formula based on Cr of 1.54).  Assessment:   De-escalating antibiotics; changing Vancomycin and Zosyn to Levaquin tonight.  Goal of Therapy:  appropriate Levaquin dose for renal function and infection  Plan:   Levaquin 750 mg IV q 48 hrs.  Will follow up renal function, length of therapy, and possible conversion to PO Levaquin at some point.  Will also watch for possible Levaquin effect on Coumadin dosing.  Dennie FettersEgan, Renly Roots Donovan, RPh Pager: [phone] 12/15/2013,5:54 PM

## 2013-12-15 NOTE — Progress Notes (Signed)
Utilization Review CompletedCrystal K. Montrey Buist, RN, BSN, MSHL, CCM  12/15/2013 4:02 PM

## 2013-12-15 NOTE — Progress Notes (Signed)
ANTIBIOTIC CONSULT NOTE  Pharmacy Consult for Vancomycin/Zosyn Indication: rule out pneumonia  No Known Allergies  Patient Measurements: Height: 6' (182.9 cm) Weight: 202 lb 12.8 oz (91.989 kg) (bed scale) IBW/kg (Calculated) : 77.6  Vital Signs: Temp: 97.3 F (36.3 C) (01/02 1317) Temp src: Oral (01/02 1317) BP: 127/73 mmHg (01/02 1317) Pulse Rate: 85 (01/02 1317) Intake/Output from previous day: 01/01 0701 - 01/02 0700 In: 1100 [P.O.:700; IV Piggyback:400] Out: 3150 [Urine:3150] Intake/Output from this shift: Total I/O In: 600 [P.O.:600] Out: 1400 [Urine:1400]  Labs:  Recent Labs  12/13/13 0534 12/14/13 0530 12/15/13 0543  WBC  --  7.3  --   HGB  --  11.0*  --   PLT  --  220  --   CREATININE 1.50* 1.62* 1.54*   Estimated Creatinine Clearance: 42 ml/min (by C-G formula based on Cr of 1.54).  Microbiology: Recent Results (from the past 720 hour(s))  MRSA PCR SCREENING     Status: None   Collection Time    11/24/13  1:39 AM      Result Value Range Status   MRSA by PCR NEGATIVE  NEGATIVE Final   Comment:            The GeneXpert MRSA Assay (FDA     approved for NASAL specimens     only), is one component of a     comprehensive MRSA colonization     surveillance program. It is not     intended to diagnose MRSA     infection nor to guide or     monitor treatment for     MRSA infections.  URINE CULTURE     Status: None   Collection Time    11/27/13  1:48 PM      Result Value Range Status   Specimen Description URINE, CATHETERIZED   Final   Special Requests NONE   Final   Culture  Setup Time     Final   Value: 11/27/2013 15:20     Performed at [facility] Count     Final   Value: NO GROWTH     Performed at [facility]   Culture     Final   Value: NO GROWTH     Performed at [facility]   Report Status 11/28/2013 FINAL   Final  MRSA PCR SCREENING     Status: None   Collection Time    12/10/13 12:09 PM      Result  Value Range Status   MRSA by PCR NEGATIVE  NEGATIVE Final   Comment:            The GeneXpert MRSA Assay (FDA     approved for NASAL specimens     only), is one component of a     comprehensive MRSA colonization     surveillance program. It is not     intended to diagnose MRSA     infection nor to guide or     monitor treatment for     MRSA infections.  URINE CULTURE     Status: None   Collection Time    12/10/13  6:02 PM      Result Value Range Status   Specimen Description URINE, CATHETERIZED   Final   Special Requests NONE   Final   Culture  Setup Time     Final   Value: 12/11/2013 01:10     Performed at [facility]  Colony Count     Final   Value: 50,000 COLONIES/ML     [facility]   Culture     Final   Value: ESCHERICHIA COLI     [facility]formed at [facility]   Report Status 12/13/2013 FINAL   Final   Organism ID, Bacteria ESCHERICHIA COLI   Final    Assessment: 82 YOM continues on broad spectrum antibiotics. Renal function has remained stable, est CrCL ~40-1445mL/min. WBC 7.3, afebrile. Noted possible plans to de-escalate soon. Only positive cultures is 50K E.Coli in urine.  Goal of Therapy:  Vancomycin trough level 15-20 mcg/ml  Plan:  1. Continue Vancomycin 750 mg IV q12h 2. Continue Zosyn 3.375G IV q8h EI 3. Follow for de-escalation 4. Follow renal function, clinical progression, total length of therapy  Bodey Frizell D. Eiza Canniff, PharmD, BCPS Clinical Pharmacist Pager: [phone] 12/15/2013 3:24 PM

## 2013-12-15 NOTE — Progress Notes (Signed)
Pt having 2nd epistaxis Nose pinched with head forward with cold washcloth for 20 min to control bleeding. Bleeding stopped. Rn from night shift getting report while pressure was held and notified MD of pt having 2 nd epistaxis. Pt is on Coumadin monitored by pharmacy and daily INR's see labs.

## 2013-12-15 NOTE — Progress Notes (Deleted)
RN notified by CMT of pt. Rhythm change. RN in to assess pt. VS obtained and stable. Pt. Resting in bed. No distress noted. Pt. Stated that he felt some dizziness. On call MD, J. McClung, notified via text page. NT unable to obtain standing wt. D/t pt. Being dizzy. Bed weight obtained. RN will continue to monitor pt. For changes in condition. Trenell Moxey Cherrell   

## 2013-12-15 NOTE — Progress Notes (Signed)
Patient: [name] [name] / Admit Date: 12/10/2013 / Date of Encounter: 12/15/2013, 12:18 PM  Subjective   Feeling much better this am. Transitioned from amio to diltiazem on 12/14/13 and remains in SR 70s. Continues to diurese, weight down another 5 lbs overnight. Has not been OOB. +orthopnea and LE edema  Objective   Telemetry: SR 70s  Physical Exam: Blood pressure 122/73, pulse 85, temperature 97.8 F (36.6 C), temperature source Oral, resp. rate 20, height 6' (1.829 m), weight 202 lb 12.8 oz (91.989 kg), SpO2 99.00%. General: Well developed chronically ill app; lying in bed; no acute distress. Head: Normocephalic, atraumatic, sclera non-icteric, no xanthomas, nares are without discharge. Neck: JVP 10-11 Lungs: Crackles at bases otherwise diffuse wheezing and coarse rhonchorous BS. Breathing is unlabored. Heart: Regular rhythm, S1 S2 without murmurs, rubs, or gallops.  Abdomen: Soft, non-tender, non-distended with normoactive bowel sounds. No rebound/guarding. Extremities: No clubbing or cyanosis. 2-3+ pitting pale edema bilat LE.  Neuro: Alert and oriented X 3. Moves all extremities spontaneously. Psych:  Responds to questions appropriately with a normal affect.   Intake/Output Summary (Last 24 hours) at 12/15/13 1218 Last data filed at 12/15/13 1054  Gross per 24 hour  Intake   1070 ml  Output   3300 ml  Net  -2230 ml    Inpatient Medications:  . antiseptic oral rinse  15 mL Mouth Rinse q12n4p  . atorvastatin  10 mg Oral Daily  . chlorhexidine  15 mL Mouth Rinse BID  . diltiazem  240 mg Oral Daily  . famotidine  20 mg Oral QHS  . furosemide  80 mg Intravenous Q8H  . guaiFENesin  600 mg Oral BID  . insulin aspart  0-9 Units Subcutaneous TID WC  . levalbuterol  0.63 mg Nebulization TID  . piperacillin-tazobactam (ZOSYN)  IV  3.375 g Intravenous Q8H  . potassium chloride  40 mEq Oral Daily  . predniSONE  20 mg Oral BID WC  . vancomycin  750 mg Intravenous Q12H  . warfarin   2.5 mg Oral ONCE-1800  . Warfarin - Pharmacist Dosing Inpatient   Does not apply q1800   Infusions:     Labs:  Recent Labs  12/14/13 0530 12/15/13 0543  NA 140 142  K 3.8 4.1  CL 94* 96  CO2 29 31  GLUCOSE 271* 254*  BUN 42* 40*  CREATININE 1.62* 1.54*  CALCIUM 8.4 8.6   No results found for this basename: AST, ALT, ALKPHOS, BILITOT, PROT, ALBUMIN,  in the last 72 hours  Recent Labs  12/14/13 0530  WBC 7.3  HGB 11.0*  HCT 33.9*  MCV 93.6  PLT 220   No results found for this basename: CKTOTAL, CKMB, TROPONINI,  in the last 72 hours No components found with this basename: POCBNP,  No results found for this basename: HGBA1C,  in the last 72 hours   Radiology/Studies:  Ct Head Wo Contrast  12/11/2013   CLINICAL DATA:  Acute mental status change.  EXAM: CT HEAD WITHOUT CONTRAST  TECHNIQUE: Contiguous axial images were obtained from the base of the skull through the vertex without intravenous contrast.  COMPARISON:  MRI brain 11/22/2013.  CT head 11/22/2013.  FINDINGS: Diffuse cerebral atrophy. Ventricular dilatation likely represent central atrophy. Patchy low-attenuation changes in the deep white matter consistent with small vessel ischemia. No mass effect or midline shift. No abnormal extra-axial fluid collections. Gray-white matter junctions are distinct. Basal cisterns are not effaced. No evidence of acute intracranial hemorrhage. No depressed skull fractures.  Visualized paranasal sinuses and mastoid air cells are not opacified. Vascular calcifications. No significant interval change.  IMPRESSION: No acute intracranial abnormalities. Stable appearance of chronic atrophy and small vessel ischemic change.   Electronically Signed   By: Burman Nieves M.D.   On: 12/11/2013 23:30   Dg Chest Port 1 View  12/11/2013   CLINICAL DATA:  Shortness of breath and respiratory distress.  EXAM: PORTABLE CHEST - 1 VIEW  COMPARISON:  12/10/2013  FINDINGS: Cardiac enlargement with pulmonary  vascular congestion. Infiltrates in the lung bases suggest early edema. No pleural effusion or pneumothorax suggested. Degenerative changes in the shoulders. Probably no significant change since previous study, allowing for differences in technique.  IMPRESSION: Cardiac enlargement with mild pulmonary vascular congestion and suggestion of basilar edema.   Electronically Signed   By: Burman Nieves M.D.   On: 12/11/2013 01:10   Dg Chest Port 1 View  12/01/2013   CLINICAL DATA:  Pulmonary edema.  EXAM: PORTABLE CHEST - 1 VIEW  COMPARISON:  11/29/2013.  FINDINGS: Left IJ line in stable position. Right carotid stent noted. Persistent prominent cardiomegaly is present. Pulmonary venous congestion and bilateral interstitial and alveolar infiltrates are noted consistent with persistent congestive heart failure and pulmonary edema. Superimposed pneumonia in the right lung base cannot be excluded. Small right pleural effusion cannot be excluded. No pneumothorax. No acute osseous abnormality.  IMPRESSION: Persistent unchanged congestive heart failure with pulmonary edema.   Electronically Signed   By: Maisie Fus  Register   On: 12/01/2013 07:40   Dg Chest Port 1 View  11/29/2013   CLINICAL DATA:  Assess edema.  EXAM: PORTABLE CHEST - 1 VIEW  COMPARISON:  11/28/2013  FINDINGS: Left jugular central line is near the junction of the superior vena cava and left innominate vein. Interstitial pulmonary edema has slightly improved. There continues to be increased densities at the right lung base. Heart size is mildly enlarged. Vascular stent on the right side of the neck.  IMPRESSION: Interstitial edema has minimally improved. There continues to be increased densities at the right lung base.   Electronically Signed   By: Richarda Overlie M.D.   On: 11/29/2013 07:41   Dg Chest Port 1 View  11/28/2013   CLINICAL DATA:  Shortness of breath.  EXAM: PORTABLE CHEST - 1 VIEW  COMPARISON:  11/27/2013.  FINDINGS: Left IJ catheter in good  anatomic position. Right carotid stent. Cardiomegaly with pulmonary venous congestion and prominent bilateral pulmonary interstitial and alveolar infiltrates. Small right pleural effusion may be present. These findings are consistent with congestive heart failure with pulmonary edema. Superimposed pneumonia cannot be excluded. No pneumothorax. Degenerative changes both shoulders.  IMPRESSION: 1. Congestive heart failure with bilateral pulmonary edema. Small right pleural effusion may be present. Superimposed pneumonia cannot be excluded. 2. Left IJ catheter in stable position.  Right carotid stent.   Electronically Signed   By: Maisie Fus  Register   On: 11/28/2013 07:24   Dg Chest Port 1 View  11/27/2013   CLINICAL DATA:  Line placement  EXAM: PORTABLE CHEST - 1 VIEW  COMPARISON:  11/25/2013; 11/24/2013; 11/22/2013; 11/25/2010  FINDINGS: Grossly unchanged enlarged cardiac silhouette and mediastinal contours. Interval placement of left jugular approach central venous catheter with tip projected over the superior/ mid SVC. No pneumothorax. Bibasilar heterogeneous slightly nodular opacities are grossly unchanged, right greater than left. The pulmonary vasculature appears slightly indistinct, in particular within the right upper and mid lung. No definite pleural effusion. Grossly unchanged bones.  IMPRESSION: 1. Interval  placement of left jugular approach under venous catheter with tip projected over to the superior/mid SVC. No pneumothorax. 2. Persistent findings of bibasilar opacities, right greater than left, atelectasis versus infiltrate. 3. Cardiomegaly and possible mild asymmetric pulmonary edema, right greater than left.   Electronically Signed   By: Simonne Come M.D.   On: 11/27/2013 14:03   Dg Chest Port 1 View  11/25/2013   CLINICAL DATA:  Respiratory failure  EXAM: PORTABLE CHEST - 1 VIEW  COMPARISON:  11/24/2013  FINDINGS: Cardiac shadow is stable. The endotracheal tube and nasogastric catheter been  removed in the interval. Bibasilar atelectatic changes are noted. No focal confluent infiltrate is seen. No acute bony abnormality is noted.  IMPRESSION: Bibasilar atelectatic changes   Electronically Signed   By: Alcide Clever M.D.   On: 11/25/2013 07:29   Portable Chest Xray  11/24/2013   CLINICAL DATA:  Endotracheal tube placement.  EXAM: PORTABLE CHEST - 1 VIEW  COMPARISON:  Chest radiograph performed 11/23/2013  FINDINGS: The patient's endotracheal tube is seen ending 4 cm above the carina. Enteric tube is noted extending below the diaphragm.  Lung expansion is mildly decreased. Vascular congestion is noted, with bilateral central airspace opacities, raising concern for mild pulmonary edema. No definite pleural effusion or pneumothorax is seen.  The cardiomediastinal silhouette is mildly enlarged. Calcification is noted within the aortic arch. No acute osseous abnormalities are identified.  IMPRESSION: 1. Endotracheal tube seen ending 4 cm above the carina. 2. Vascular congestion and mild cardiomegaly, with bilateral central airspace opacities, raising concern for mild pulmonary edema. Previously noted right basilar opacity has improved.   Electronically Signed   By: Roanna Raider M.D.   On: 11/24/2013 02:34   Dg Chest Portable 1 View  11/23/2013   CLINICAL DATA:  Shortness of breath; preoperative chest radiograph.  EXAM: PORTABLE CHEST - 1 VIEW  COMPARISON:  Chest radiograph performed 11/22/2013  FINDINGS: The lungs are well-aerated. Mild bibasilar airspace opacities are slightly worsened from the prior study and may reflect atelectasis, mild pneumonia or possibly mild pulmonary edema, given underlying vascular congestion. No pleural effusion or pneumothorax is seen.  The cardiomediastinal silhouette is mildly enlarged. No acute osseous abnormalities are seen.  IMPRESSION: Mild bibasilar airspace opacities are slightly worsened from the recent prior study and may reflect atelectasis, mild pneumonia or  possibly mild pulmonary edema, given underlying vascular congestion and mild cardiomegaly.   Electronically Signed   By: Roanna Raider M.D.   On: 11/23/2013 21:51   Dg Abd Portable 1v  11/30/2013   CLINICAL DATA:  Abdominal distention.  Diabetes.  EXAM: PORTABLE ABDOMEN - 1 VIEW  COMPARISON:  Lumbar spine series 10 19 2009.  FINDINGS: Moderate gastric distention noted. Paucity of gas is noted distally. Gastric outlet obstruction cannot be excluded. Calcific density noted over the left kidney suggesting left nephrolithiasis. Degenerative changes lumbar spine and both hips.  IMPRESSION: Cannot exclude gastric outlet obstruction. These results will be called to the ordering clinician or representative by the Radiologist Assistant, and communication documented in the PACS Dashboard.   Electronically Signed   By: Maisie Fus  Register   On: 11/30/2013 12:11     Assessment and Plan  80 yo with history of chronic atrial fibrillation, COPD, diastolic CHF, recent RLE embolic event s/p thrombectomy/angioplasty admitted with respiratory failure, suspect combination of acute/chronic diastolic CHF and acute exacerbation of COPD.   1. Acute respiratory failure: Suspect acute exacerbation COPD, possible viral bronchitis as instigator. Mucus plug removed 12/29.  Remains on antibiotics, steroids, nebs. Add scheduled guaifenesin. Will order flutter valve and IS. 2. Acute on chronic diastolic CHF: likely driven by rapid AF and acute illness. Steroids could also be contributing to some volume retention, however they are being weaned. He still has volume on board will continue IV lasix. Cr stable and will continue follow. Will place TED hose. Continue to work with PT and OOB as much as possible.  3. Atrial fibrillation with RVR: He is now rate-controlled on oral diltiazem. Continue coumain. INR sub-therapeutic, pharmacy dosing.  4. Acute on chronic kidney injury: Creatinine stable. Following closely with diuresis.  5. AMS  12/29: ?etiology. CT head nonacute. ABG with normal PH but low PO2. Symptoms resolved. May have been brief metabolic encephalopathy.   [name] B NP-C 12/15/2013 12:18 PM  Patient seen with NP, agree with the above note.  Atrial fibrillation, rate control with po diltiazem.  Still volume overloaded, continue current IV Lasix dosing.  He is losing weight.   Marca Ancona 12/15/2013 1:03 PM

## 2013-12-15 NOTE — Consult Note (Signed)
ENT CONSULT:  Reason for Consult: Acute epistaxis Referring Physician: Hospital Svc  [name] [name] is an 80 y.o. male.  HPI: Pt with acute epistaxis x3 this pm, no sig prior hx. Pt anticoag on coumadin for afib and dvt. No recent resp infection or trauma. Pt on [place] O2  Past Medical History  Diagnosis Date  . Hypertension   . Stroke   . COPD (chronic obstructive pulmonary disease)   . Diabetes mellitus without complication   . GERD (gastroesophageal reflux disease)   . A-fib     Past Surgical History  Procedure Laterality Date  . Carotid stent    . Parotid gland tumor excision    . Embolectomy Right 11/23/2013    Procedure: Right Femoral EMBOLECTOMY;  Surgeon: Angelia Mould, MD;  Location: [facility] OR;  Service: Vascular;  Laterality: Right;  Right Femoral Embolectomy with Bovine Paricardium patch angioplasty.    History reviewed. No pertinent family history.  Social History:  reports that he has quit smoking. He does not have any smokeless tobacco history on file. He reports that he does not drink alcohol or use illicit drugs.  Allergies: No Known Allergies  Medications: I have reviewed the patient's current medications.  Results for orders placed during the hospital encounter of 12/10/13 (from the past 48 hour(s))  PROTIME-INR     Status: Abnormal   Collection Time    12/14/13  5:30 AM      Result Value Range   Prothrombin Time 23.3 (*) 11.6 - 15.2 seconds   INR 2.15 (*) 0.00 - 1.44  BASIC METABOLIC PANEL     Status: Abnormal   Collection Time    12/14/13  5:30 AM      Result Value Range   Sodium 140  137 - 147 mEq/L   Comment: Please note change in reference range.   Potassium 3.8  3.7 - 5.3 mEq/L   Comment: Please note change in reference range.   Chloride 94 (*) 96 - 112 mEq/L   CO2 29  19 - 32 mEq/L   Glucose, Bld 271 (*) 70 - 99 mg/dL   BUN 42 (*) 6 - 23 mg/dL   Creatinine, Ser 1.62 (*) 0.50 - 1.35 mg/dL   Calcium 8.4  8.4 - 10.5 mg/dL   GFR calc non Af  Amer 38 (*) >90 mL/min   GFR calc Af Amer 45 (*) >90 mL/min   Comment: (NOTE)     The eGFR has been calculated using the CKD EPI equation.     This calculation has not been validated in all clinical situations.     eGFR's persistently <90 mL/min signify possible Chronic Kidney     Disease.  CBC     Status: Abnormal   Collection Time    12/14/13  5:30 AM      Result Value Range   WBC 7.3  4.0 - 10.5 K/uL   RBC 3.62 (*) 4.22 - 5.81 MIL/uL   Hemoglobin 11.0 (*) 13.0 - 17.0 g/dL   HCT 33.9 (*) 39.0 - 52.0 %   MCV 93.6  78.0 - 100.0 fL   MCH 30.4  26.0 - 34.0 pg   MCHC 32.4  30.0 - 36.0 g/dL   RDW 14.1  11.5 - 15.5 %   Platelets 220  150 - 400 K/uL  GLUCOSE, CAPILLARY     Status: Abnormal   Collection Time    12/14/13  6:37 AM      Result Value Range   Glucose-Capillary  244 (*) 70 - 99 mg/dL  GLUCOSE, CAPILLARY     Status: Abnormal   Collection Time    12/14/13 11:41 AM      Result Value Range   Glucose-Capillary 284 (*) 70 - 99 mg/dL   Comment 1 Notify RN    GLUCOSE, CAPILLARY     Status: Abnormal   Collection Time    12/14/13  4:03 PM      Result Value Range   Glucose-Capillary 219 (*) 70 - 99 mg/dL   Comment 1 Notify RN    GLUCOSE, CAPILLARY     Status: Abnormal   Collection Time    12/14/13  9:00 PM      Result Value Range   Glucose-Capillary 234 (*) 70 - 99 mg/dL   Comment 1 Notify RN     Comment 2 Documented in Chart    PROTIME-INR     Status: Abnormal   Collection Time    12/15/13  5:43 AM      Result Value Range   Prothrombin Time 20.0 (*) 11.6 - 15.2 seconds   INR 1.76 (*) 0.00 - 9.76  BASIC METABOLIC PANEL     Status: Abnormal   Collection Time    12/15/13  5:43 AM      Result Value Range   Sodium 142  137 - 147 mEq/L   Comment: Please note change in reference range.   Potassium 4.1  3.7 - 5.3 mEq/L   Comment: Please note change in reference range.   Chloride 96  96 - 112 mEq/L   CO2 31  19 - 32 mEq/L   Glucose, Bld 254 (*) 70 - 99 mg/dL   BUN 40 (*)  6 - 23 mg/dL   Creatinine, Ser 1.54 (*) 0.50 - 1.35 mg/dL   Calcium 8.6  8.4 - 10.5 mg/dL   GFR calc non Af Amer 41 (*) >90 mL/min   GFR calc Af Amer 47 (*) >90 mL/min   Comment: (NOTE)     The eGFR has been calculated using the CKD EPI equation.     This calculation has not been validated in all clinical situations.     eGFR's persistently <90 mL/min signify possible Chronic Kidney     Disease.  GLUCOSE, CAPILLARY     Status: Abnormal   Collection Time    12/15/13  6:39 AM      Result Value Range   Glucose-Capillary 234 (*) 70 - 99 mg/dL   Comment 1 Notify RN     Comment 2 Documented in Chart    GLUCOSE, CAPILLARY     Status: Abnormal   Collection Time    12/15/13 11:01 AM      Result Value Range   Glucose-Capillary 269 (*) 70 - 99 mg/dL   Comment 1 Notify RN    GLUCOSE, CAPILLARY     Status: Abnormal   Collection Time    12/15/13  4:15 PM      Result Value Range   Glucose-Capillary 259 (*) 70 - 99 mg/dL   Comment 1 Notify RN    GLUCOSE, CAPILLARY     Status: Abnormal   Collection Time    12/15/13  9:24 PM      Result Value Range   Glucose-Capillary 196 (*) 70 - 99 mg/dL   Comment 1 Notify RN     Comment 2 Documented in Chart      No results found.  ROS: Stable inpt, no prior oto hx  Blood pressure  135/61, pulse 68, temperature 97.4 F (36.3 C), temperature source Axillary, resp. rate 21, height 6' (1.829 m), weight 91.989 kg (202 lb 12.8 oz), SpO2 94.00%.  PHYSICAL EXAM: General appearance - alert, well appearing, and in no distress Nose - Patent, active epistaxis Rt nostril Mouth - mucous membranes moist, pharynx normal without lesions, edentulous and clot in OP cleared Neck - supple, no significant adenopathy  Procedure: Cautery and packing Epistaxis Nasal passageway anesthetized with topical lido and afrin Active bleeding Rt mid septum AgNO3 cautery Rt ant nasal merocel pack placed after Bacitracin oint   Studies  Reviewed:Labs  Assessment/Plan: Stable after packing Monitor INR and coags Leave packing inplace for 5 days, cont levaquin for abx coverage Saline nasal spray F/U as outpt for packing removal, pt may see pcp     Roneshia Drew 12/15/2013, 10:32 PM

## 2013-12-15 NOTE — Progress Notes (Signed)
Pt had epistaxis pinched nose with cold washcloth for 20 min & bleeding stopped added humidification to 02. HOB up told pt to try not to " snort" or to pick clot Rt nares.

## 2013-12-16 LAB — BASIC METABOLIC PANEL
BUN: 46 mg/dL — AB (ref 6–23)
CO2: 26 mEq/L (ref 19–32)
CREATININE: 1.47 mg/dL — AB (ref 0.50–1.35)
Calcium: 8.8 mg/dL (ref 8.4–10.5)
Chloride: 96 mEq/L (ref 96–112)
GFR calc non Af Amer: 43 mL/min — ABNORMAL LOW (ref >90)
GFR, EST AFRICAN AMERICAN: 50 mL/min — AB (ref >90)
Glucose, Bld: 268 mg/dL — ABNORMAL HIGH (ref 70–99)
Potassium: 4.5 mEq/L (ref 3.7–5.3)
Sodium: 138 mEq/L (ref 137–147)

## 2013-12-16 LAB — PROTIME-INR
INR: 1.6 — ABNORMAL HIGH (ref 0.00–1.49)
Prothrombin Time: 18.6 seconds — ABNORMAL HIGH (ref 11.6–15.2)

## 2013-12-16 LAB — GLUCOSE, CAPILLARY
GLUCOSE-CAPILLARY: 267 mg/dL — AB (ref 70–99)
Glucose-Capillary: 238 mg/dL — ABNORMAL HIGH (ref 70–99)
Glucose-Capillary: 239 mg/dL — ABNORMAL HIGH (ref 70–99)

## 2013-12-16 LAB — CBC
HEMATOCRIT: 35.8 % — AB (ref 39.0–52.0)
Hemoglobin: 11.5 g/dL — ABNORMAL LOW (ref 13.0–17.0)
MCH: 30.1 pg (ref 26.0–34.0)
MCHC: 32.1 g/dL (ref 30.0–36.0)
MCV: 93.7 fL (ref 78.0–100.0)
Platelets: 233 10*3/uL (ref 150–400)
RBC: 3.82 MIL/uL — ABNORMAL LOW (ref 4.22–5.81)
RDW: 14.2 % (ref 11.5–15.5)
WBC: 12.2 10*3/uL — ABNORMAL HIGH (ref 4.0–10.5)

## 2013-12-16 MED ORDER — FUROSEMIDE 10 MG/ML IJ SOLN
80.0000 mg | Freq: Two times a day (BID) | INTRAMUSCULAR | Status: DC
  Administered 2013-12-16 – 2013-12-17 (×2): 80 mg via INTRAVENOUS
  Filled 2013-12-16 (×2): qty 8

## 2013-12-16 MED ORDER — WARFARIN SODIUM 2.5 MG PO TABS
2.5000 mg | ORAL_TABLET | Freq: Once | ORAL | Status: AC
  Administered 2013-12-16: 2.5 mg via ORAL
  Filled 2013-12-16: qty 1

## 2013-12-16 NOTE — Progress Notes (Signed)
ANTICOAGULATION CONSULT NOTE - Follow-up  Pharmacy Consult for warfarin Indication: atrial fibrillation  No Known Allergies  Patient Measurements: Height: 6' (182.9 cm) Weight: 198 lb 15.8 oz (90.26 kg) IBW/kg (Calculated) : 77.6  Vital Signs: Temp: 97.6 F (36.4 C) (01/03 0733) Temp src: Oral (01/03 0733) BP: 160/65 mmHg (01/03 0733) Pulse Rate: 71 (01/03 0733)  Labs:  Recent Labs  12/14/13 0530 12/15/13 0543 12/16/13 0410 12/16/13 0614  HGB 11.0*  --  11.5*  --   HCT 33.9*  --  35.8*  --   PLT 220  --  233  --   LABPROT 23.3* 20.0*  --  18.6*  INR 2.15* 1.76*  --  1.60*  CREATININE 1.62* 1.54* 1.47*  --     Estimated Creatinine Clearance: 44 ml/min (by C-G formula based on Cr of 1.47).  Assessment: 82 YOM continues on warfarin for afib. INR SUBtherapeutic at 1.60 this AM from 1.76 - patient was changed from amiodarone to diltiazem on 1/1 for AFib rate control which does not have the same potentiating effect on INR as amiodarone. Patient was switched to levofloxacin this am which can potentiate INR. CBC stable, with noted acute epistaxis x 3 yesterday evening which was stabilized after packing by ENT.   Warfarin PTA 2.5mg  daily  Goal of Therapy:  INR 2-3 Monitor platelets by anticoagulation protocol: Yes  Plan:  1. Warfarin 2.5mg  x 1 tonight 2. Continue to monitor daily PT/INR, s/s bleeding  Margie BilletErika K. von Vajna, PharmD Clinical Pharmacist - Resident Pager: [phone] Pharmacy: [phone] 12/16/2013 7:45 AM

## 2013-12-16 NOTE — Progress Notes (Signed)
12/15/13 1900-0700 shift. Pt.is A/Ox3. During bedside shift report was told by day shift RN that patient had the first episode of epistaxis to the right nare around 1750 where there was a large amount of bloody drainage. Shortly before 1900 patient's nose began to bleed a large amount of bloody drainage again and day shift RN applied pressure and cold compresses to help stop the bleeding. Bleeding temporarily stopped. 1945 paged mid-level on call with [facility] to notify. New orders were written and was told to continue to monitor pt. for bleeding. Patient's nose began to bleed again about 10 mins after mid-level was first paged. There was a large amount of bloody drainage and mid-level was notified again. Was told that an ENT physician would come to treat patient's nose bleed. ENT physician arrived and was shown a large blood clot that came out of patient's nose when he coughed.Stayed at the patient's beside from start of the patient's bedside shift report at 1923 until the MD completed treating and packing the patient's nose around 2230. No more bleeding noted during the shift after nasal packing was done. Patient continues to be A/Ox3 and had no c/o pain and no signs of respiratory distress.

## 2013-12-16 NOTE — Progress Notes (Signed)
Physical Therapy Treatment Patient Details Name: [name] [name] MRN: [id number] DOB: 12-22-1930 Today's Date: 12/16/2013 Time: 1191-4782 PT Time Calculation (min): 15 min  PT Assessment / Plan / Recommendation  History of Present Illness 80 y/o male with diastolic CHF, HTN COPD, NIRDM with A-fib with recent embolic event to right leg s/p right femoral embolectomy.  He was discharged to SNF on 12/05/12.  He was readmitted on 12/28 with acute respiratory failure suspected to be due to a COPD exacerbation and decompensated CHF and admitted by the hospitalist service and transferred to the ICU on 12/29 early AM for respiratory distress possibly due to mucous plugging. Required BiPAP   PT Comments   Pt slowly progressing toward PT goals. Recommended nursing use steady lift and 2 people for back to bed.  Follow Up Recommendations  SNF     Equipment Recommendations  None recommended by PT       Frequency Min 3X/week   Progress towards PT Goals Progress towards PT goals: Progressing toward goals  Plan Current plan remains appropriate    Precautions / Restrictions Precautions Precautions: Fall Precaution Comments: monitor O2 sats Required Braces or Orthoses: Other Brace/Splint Other Brace/Splint: pt normally uses Lt double-upright [name] (currently at home-pt's spouse to bring it in tomorrow for use with future sessions if it will fit as pt does have some LE edema) Restrictions Weight Bearing Restrictions: No       Mobility  Bed Mobility Bed Mobility: Left Sidelying to Sit;Rolling Left;Sitting - Scoot to Edge of Bed Rolling Left: 4: Min assist;With rail Left Sidelying to Sit: 3: Mod assist;HOB elevated;With rails Sitting - Scoot to Edge of Bed: 2: Max assist;With rail Sitting - Scoot to [place] of Bed: Patient Percentage: 40% Details for Bed Mobility Assistance: Pt able to assist with moving LEs off bed and with lifting trunk into sitting position. Pad under pt used to assist with scooting to  edge of bed. Transfers Sit to Stand: 1: +2 Total assist;From bed Sit to Stand: Patient Percentage: 40% Stand to Sit: 1: +2 Total assist Stand to Sit: Patient Percentage: 30% Stand Pivot Transfers: 1: +2 Total assist Stand Pivot Transfers: Patient Percentage: 40% Details for Transfer Assistance: requires 2 person assist for out of bed activity. pt able to assist with weight shift to stand and complete stand pivot on first stand today. Ambulation/Gait Ambulation/Gait Assistance: Not tested (comment)      PT Goals (current goals can now be found in the care plan section) Acute Rehab PT Goals Patient Stated Goal: to get better PT Goal Formulation: With patient Time For Goal Achievement: 12/27/13 Potential to Achieve Goals: Good  Visit Information  Last PT Received On: 12/16/13 Assistance Needed: +2 History of Present Illness: 80 y/o male with diastolic CHF, HTN COPD, NIRDM with A-fib with recent embolic event to right leg s/p right femoral embolectomy.  He was discharged to SNF on 12/05/12.  He was readmitted on 12/28 with acute respiratory failure suspected to be due to a COPD exacerbation and decompensated CHF and admitted by the hospitalist service and transferred to the ICU on 12/29 early AM for respiratory distress possibly due to mucous plugging. Required BiPAP    Subjective Data  Patient Stated Goal: to get better   Cognition  Cognition Arousal/Alertness: Awake/alert Overall Cognitive Status: History of cognitive impairments - at baseline Memory: Decreased short-term memory       End of Session PT - End of Session Equipment Utilized During Treatment: Gait belt Activity Tolerance: Patient tolerated  treatment well;Patient limited by fatigue Patient left: in chair;with family/visitor present;with call bell/phone within reach Nurse Communication: Mobility status;Need for lift equipment   GP     Sallyanne KusterBury, Kathy 12/16/2013, 3:33 PM

## 2013-12-16 NOTE — Progress Notes (Addendum)
Client is awaiting assistance from PT with ambulation.  Skin warm and dry to touch.  IV lines changed per protocol.  1553 Up in chair with two person assist and gait belt.  Client's wife is to bring brace for left leg to assist him with mobility tomorrow.

## 2013-12-16 NOTE — Progress Notes (Signed)
Patient ID: [name] [name], male   DOB: [birth year], 80 y.o.   MRN: [id number]   SUBJECTIVE: HR controlled in 90s (atrial fibrillation).  Diuresing well.  No audible wheezing today.  Feels better overall.   Marland Kitchen antiseptic oral rinse  15 mL Mouth Rinse q12n4p  . atorvastatin  10 mg Oral Daily  . diltiazem  240 mg Oral Daily  . famotidine  20 mg Oral QHS  . furosemide  80 mg Intravenous BID  . guaiFENesin  600 mg Oral BID  . insulin aspart  0-9 Units Subcutaneous TID WC  . levalbuterol  0.63 mg Nebulization TID  . levofloxacin (LEVAQUIN) IV  750 mg Intravenous Q48H  . oxymetazoline  1 spray Each Nare BID  . potassium chloride  40 mEq Oral Daily  . predniSONE  10 mg Oral BID WC  . sodium chloride  4 spray Each Nare 6 X Daily  . warfarin  2.5 mg Oral ONCE-1800  . Warfarin - Pharmacist Dosing Inpatient   Does not apply q1800    Filed Vitals:   12/16/13 0718 12/16/13 0733 12/16/13 0825 12/16/13 1013  BP:  160/65  132/79  Pulse:  71  62  Temp:  97.6 F (36.4 C)  98.5 F (36.9 C)  TempSrc:  Oral  Axillary  Resp:  20  18  Height:      Weight: 90.084 kg (198 lb 9.6 oz) 90.26 kg (198 lb 15.8 oz)    SpO2:  98% 95% 100%    Intake/Output Summary (Last 24 hours) at 12/16/13 1150 Last data filed at 12/16/13 1013  Gross per 24 hour  Intake   1020 ml  Output   4125 ml  Net  -3105 ml    LABS: Basic Metabolic Panel:  Recent Labs  40/98/11 0543 12/16/13 0410  NA 142 138  K 4.1 4.5  CL 96 96  CO2 31 26  GLUCOSE 254* 268*  BUN 40* 46*  CREATININE 1.54* 1.47*  CALCIUM 8.6 8.8   Liver Function Tests: No results found for this basename: AST, ALT, ALKPHOS, BILITOT, PROT, ALBUMIN,  in the last 72 hours No results found for this basename: LIPASE, AMYLASE,  in the last 72 hours CBC:  Recent Labs  12/14/13 0530 12/16/13 0410  WBC 7.3 12.2*  HGB 11.0* 11.5*  HCT 33.9* 35.8*  MCV 93.6 93.7  PLT 220 233   Cardiac Enzymes: No results found for this basename: CKTOTAL, CKMB,  CKMBINDEX, TROPONINI,  in the last 72 hours BNP: No components found with this basename: POCBNP,  D-Dimer: No results found for this basename: DDIMER,  in the last 72 hours Hemoglobin A1C: No results found for this basename: HGBA1C,  in the last 72 hours Fasting Lipid Panel: No results found for this basename: CHOL, HDL, LDLCALC, TRIG, CHOLHDL, LDLDIRECT,  in the last 72 hours Thyroid Function Tests: No results found for this basename: TSH, T4TOTAL, FREET3, T3FREE, THYROIDAB,  in the last 72 hours Anemia Panel: No results found for this basename: VITAMINB12, FOLATE, FERRITIN, TIBC, IRON, RETICCTPCT,  in the last 72 hours  PHYSICAL EXAM General: NAD Neck: JVP 8 cm, no thyromegaly or thyroid nodule.  Lungs: Bilateral rhonchi CV: Nondisplaced PMI.  Heart irregular S1/S2, no S3/S4, no murmur.  1+ edema 1/2 to knees bilaterally.   Abdomen: Soft, nontender, no hepatosplenomegaly, no distention.  Neurologic: Alert and oriented x 3.  Psych: Normal affect. Extremities: No clubbing or cyanosis.   TELEMETRY: Reviewed telemetry pt in atrial fibrillation with rate in  90s  ASSESSMENT AND PLAN: 80 yo with h11istory of chronic atrial fibrillation, COPD, and diastolic CHF admitted with respiratory failure, suspect combination of acute/chronic diastolic CHF and acute exacerbation of COPD.   1. Pulmonary: Suspect acute exacerbation COPD, possible viral bronchitis as instigator.  Remains on antibiotics and steroids per CCM.  2. Acute on chronic diastolic CHF: Volume appears better today.  Creatinine stable.  I will decrease IV Lasix to bid today, probably transition to po tomorrow.  3. Atrial fibrillation with RVR: HR stable, on warfarin and diltiazem CD. 4. Acute on chronic kidney injury: Creatinine improving now.    [name] [name] 12/16/2013 11:50 AM

## 2013-12-16 NOTE — Progress Notes (Signed)
PROGRESS NOTE  [name] [name] JWJ:[id number] DOB: 1930-12-30 DOA: 12/10/2013 PCP: No PCP Per Patient  Assessment/Plan: Acute respiratory failure with hypoxia  - multifactorial likely due to COPD exacerbation, acute diastolic CHF exacerbation, HCAP - management per Cardiology - remains markedly volume overloaded. On Lasix 80 TID. Net negative 8.7L - Slow steroid taper, solu-medrol discontinued last night, transition to prednisone 20 BID and change to 10 BID tomorrow.   - cont nebs and Robittusin DM and flutter valve  - chronically on 2 L of O2 at home  - started on Vanc and Zosyn on 12/28 by PCCM; de-escalate to Levofloxacin 1/2 - repeat CXR 1/4 am Atrial fibrillation with RVR  - currently controlled on Amiodarone  - management per Cardiology  - INR theraputic  Epistaxis - noted events overnight, appreciate ENT consult. Improved, stable, no evidence of bleeding. CBC stable this morning.  - packing to be removed 1/8 per ENT AKI on CKD 3 - cont to follow with diuresis - crt stable, 1.5>1.62>1.54>1.47 Recent embolic event right LE - s/p embolectomy  Diabetes mellitus - non-insulin requiring - CBG elevated due to steroids  - sliding scale  - taper steroids, 10 BID tomorrow.  Hyperlipidemia  BPH - cont Hytrin  H/O CVA  Diet: carb Fluids: none DVT Prophylaxis: on Coumadin  Code Status: Full Family Communication: none  Disposition Plan: SNF when ready  Consultants:  Cardiology  PCCM  Procedures:  none   Antibiotics 12/28 vanc >> 1/2 12/28 zosyn >> 1/2 1/2 Levofloxacin > plan until 1/5  HPI/Subjective: - feeling better  Objective: Filed Vitals:   12/16/13 0718 12/16/13 0733 12/16/13 0825 12/16/13 1013  BP:  160/65  132/79  Pulse:  71  62  Temp:  97.6 F (36.4 C)  98.5 F (36.9 C)  TempSrc:  Oral  Axillary  Resp:  20  18  Height:      Weight: 90.084 kg (198 lb 9.6 oz) 90.26 kg (198 lb 15.8 oz)    SpO2:  98% 95% 100%    Intake/Output Summary (Last 24 hours)  at 12/16/13 1146 Last data filed at 12/16/13 1013  Gross per 24 hour  Intake   1020 ml  Output   4125 ml  Net  -3105 ml   Filed Weights   12/15/13 0534 12/16/13 0718 12/16/13 0733  Weight: 91.989 kg (202 lb 12.8 oz) 90.084 kg (198 lb 9.6 oz) 90.26 kg (198 lb 15.8 oz)   Exam:  General:  NAD  Cardiovascular: irregular, JVD present  Respiratory: good air movement, clear to auscultation throughout, no wheezing, ronchi or rales  Abdomen: soft, not tender to palpation, positive bowel sounds  MSK: 3+ peripheral edema  Neuro: CN 2-12 grossly intact, MS 5/5 in all 4  Data Reviewed: Basic Metabolic Panel:  Recent Labs Lab 12/11/13 0330 12/12/13 0330 12/13/13 0534 12/14/13 0530 12/15/13 0543 12/16/13 0410  NA 134* 138 142 140 142 138  K 4.6 4.6 3.6* 3.8 4.1 4.5  CL 95* 96 97 94* 96 96  CO2 29 25 29 29 31 26   GLUCOSE 126* 275* 223* 271* 254* 268*  BUN 32* 37* 39* 42* 40* 46*  CREATININE 1.70* 1.65* 1.50* 1.62* 1.54* 1.47*  CALCIUM 9.0 8.8 8.2* 8.4 8.6 8.8  MG 1.9  --   --   --   --   --    Liver Function Tests:  Recent Labs Lab 12/11/13 0330  AST 24  ALT 16  ALKPHOS 76  BILITOT 1.0  PROT 6.3  ALBUMIN 2.9*   CBC:  Recent Labs Lab 12/10/13 1630 12/11/13 0330 12/14/13 0530 12/16/13 0410  WBC 6.7 5.9 7.3 12.2*  HGB 10.1* 10.2* 11.0* 11.5*  HCT 31.3* 31.2* 33.9* 35.8*  MCV 94.8 93.7 93.6 93.7  PLT 256 254 220 233   Cardiac Enzymes:  Recent Labs Lab 12/10/13 1630  TROPONINI <0.30   BNP (last 3 results)  Recent Labs  12/10/13 1630  PROBNP 5532.0*   CBG:  Recent Labs Lab 12/15/13 1101 12/15/13 1615 12/15/13 2124 12/16/13 0619 12/16/13 1043  GLUCAP 269* 259* 196* 238* 239*    Recent Results (from the past 240 hour(s))  MRSA PCR SCREENING     Status: None   Collection Time    12/10/13 12:09 PM      Result Value Range Status   MRSA by PCR NEGATIVE  NEGATIVE Final   Comment:            The GeneXpert MRSA Assay (FDA     approved for  NASAL specimens     only), is one component of a     comprehensive MRSA colonization     surveillance program. It is not     intended to diagnose MRSA     infection nor to guide or     monitor treatment for     MRSA infections.  URINE CULTURE     Status: None   Collection Time    12/10/13  6:02 PM      Result Value Range Status   Specimen Description URINE, CATHETERIZED   Final   Special Requests NONE   Final   Culture  Setup Time     Final   Value: 12/11/2013 01:10     Performed at [facility] Count     Final   Value: 50,000 COLONIES/ML     Performed at [facility]   Culture     Final   Value: ESCHERICHIA COLI     Performed at [facility]   Report Status 12/13/2013 FINAL   Final   Organism ID, Bacteria ESCHERICHIA COLI   Final   Studies: No results found.  Scheduled Meds: . antiseptic oral rinse  15 mL Mouth Rinse q12n4p  . atorvastatin  10 mg Oral Daily  . diltiazem  240 mg Oral Daily  . famotidine  20 mg Oral QHS  . furosemide  80 mg Intravenous Q8H  . guaiFENesin  600 mg Oral BID  . insulin aspart  0-9 Units Subcutaneous TID WC  . levalbuterol  0.63 mg Nebulization TID  . levofloxacin (LEVAQUIN) IV  750 mg Intravenous Q48H  . oxymetazoline  1 spray Each Nare BID  . potassium chloride  40 mEq Oral Daily  . predniSONE  10 mg Oral BID WC  . sodium chloride  4 spray Each Nare 6 X Daily  . warfarin  2.5 mg Oral ONCE-1800  . Warfarin - Pharmacist Dosing Inpatient   Does not apply q1800   Continuous Infusions:   Principal Problem:   Acute respiratory failure with hypoxia Active Problems:   Atrial fibrillation   H/O: CVA (cerebrovascular accident)   COPD (chronic obstructive pulmonary disease)   Anasarca   Acute on chronic diastolic heart failure   Acute renal failure   Anemia   UTI (urinary tract infection)   Diabetes mellitus with hypoglycemia   Acute on chronic diastolic CHF (congestive heart failure)   Peripheral  vascular disease   Acute on chronic diastolic  congestive heart failure  Time spent: 25  Pamella Pert, MD [facility] Pager [phone]. If 7 PM - 7 AM, please contact night-coverage at [email or web address], password [facility] 12/16/2013, 11:46 AM  LOS: 6 days

## 2013-12-17 ENCOUNTER — Inpatient Hospital Stay (HOSPITAL_COMMUNITY): Payer: Medicare HMO

## 2013-12-17 DIAGNOSIS — R609 Edema, unspecified: Secondary | ICD-10-CM

## 2013-12-17 LAB — BASIC METABOLIC PANEL
BUN: 42 mg/dL — AB (ref 6–23)
CALCIUM: 9.1 mg/dL (ref 8.4–10.5)
CHLORIDE: 95 meq/L — AB (ref 96–112)
CO2: 33 mEq/L — ABNORMAL HIGH (ref 19–32)
CREATININE: 1.43 mg/dL — AB (ref 0.50–1.35)
GFR, EST AFRICAN AMERICAN: 52 mL/min — AB (ref >90)
GFR, EST NON AFRICAN AMERICAN: 45 mL/min — AB (ref >90)
Glucose, Bld: 248 mg/dL — ABNORMAL HIGH (ref 70–99)
Potassium: 4.6 mEq/L (ref 3.7–5.3)
Sodium: 139 mEq/L (ref 137–147)

## 2013-12-17 LAB — GLUCOSE, CAPILLARY
GLUCOSE-CAPILLARY: 218 mg/dL — AB (ref 70–99)
GLUCOSE-CAPILLARY: 236 mg/dL — AB (ref 70–99)
GLUCOSE-CAPILLARY: 224 mg/dL — AB (ref 70–99)
GLUCOSE-CAPILLARY: 277 mg/dL — AB (ref 70–99)

## 2013-12-17 LAB — PROTIME-INR
INR: 1.55 — AB (ref 0.00–1.49)
Prothrombin Time: 18.2 seconds — ABNORMAL HIGH (ref 11.6–15.2)

## 2013-12-17 MED ORDER — FUROSEMIDE 80 MG PO TABS
80.0000 mg | ORAL_TABLET | Freq: Two times a day (BID) | ORAL | Status: DC
  Administered 2013-12-17 – 2013-12-20 (×7): 80 mg via ORAL
  Filled 2013-12-17 (×8): qty 1

## 2013-12-17 MED ORDER — LEVALBUTEROL HCL 0.63 MG/3ML IN NEBU
0.6300 mg | INHALATION_SOLUTION | RESPIRATORY_TRACT | Status: DC | PRN
  Administered 2013-12-20: 02:00:00 0.63 mg via RESPIRATORY_TRACT

## 2013-12-17 MED ORDER — LEVOFLOXACIN 750 MG PO TABS
750.0000 mg | ORAL_TABLET | ORAL | Status: DC
  Administered 2013-12-17: 21:00:00 750 mg via ORAL
  Filled 2013-12-17 (×2): qty 1

## 2013-12-17 NOTE — Progress Notes (Signed)
Patient ID: [name] GoodyBobby [name], male   DOB: [birth year], 80 y.o.   MRN: [id number]   SUBJECTIVE: HR controlled in 90s (atrial fibrillation).  Diuresing well still.  No audible wheezing today.  Feels better overall.  He has been having intractable epistaxis.  Warfarin has been held.   Marland Kitchen. antiseptic oral rinse  15 mL Mouth Rinse q12n4p  . atorvastatin  10 mg Oral Daily  . diltiazem  240 mg Oral Daily  . famotidine  20 mg Oral QHS  . furosemide  80 mg Oral BID  . guaiFENesin  600 mg Oral BID  . insulin aspart  0-9 Units Subcutaneous TID WC  . levalbuterol  0.63 mg Nebulization TID  . levofloxacin (LEVAQUIN) IV  750 mg Intravenous Q48H  . oxymetazoline  1 spray Each Nare BID  . potassium chloride  40 mEq Oral Daily  . predniSONE  10 mg Oral BID WC  . sodium chloride  4 spray Each Nare 6 X Daily  . Warfarin - Pharmacist Dosing Inpatient   Does not apply q1800    Filed Vitals:   12/16/13 2218 12/17/13 0449 12/17/13 0854 12/17/13 1033  BP: 142/78 135/84  127/84  Pulse: 53 73    Temp: 97.9 F (36.6 C) 97.7 F (36.5 C)    TempSrc: Oral Oral    Resp: 18 18    Height:      Weight:  86.637 kg (191 lb)    SpO2: 97% 100% 100%     Intake/Output Summary (Last 24 hours) at 12/17/13 1129 Last data filed at 12/17/13 1050  Gross per 24 hour  Intake    360 ml  Output   4700 ml  Net  -4340 ml    LABS: Basic Metabolic Panel:  Recent Labs  03/47/4200/02/25 0410 12/17/13 0430  NA 138 139  K 4.5 4.6  CL 96 95*  CO2 26 33*  GLUCOSE 268* 248*  BUN 46* 42*  CREATININE 1.47* 1.43*  CALCIUM 8.8 9.1   Liver Function Tests: No results found for this basename: AST, ALT, ALKPHOS, BILITOT, PROT, ALBUMIN,  in the last 72 hours No results found for this basename: LIPASE, AMYLASE,  in the last 72 hours CBC:  Recent Labs  12/16/13 0410  WBC 12.2*  HGB 11.5*  HCT 35.8*  MCV 93.7  PLT 233   Cardiac Enzymes: No results found for this basename: CKTOTAL, CKMB, CKMBINDEX, TROPONINI,  in the last 72  hours BNP: No components found with this basename: POCBNP,  D-Dimer: No results found for this basename: DDIMER,  in the last 72 hours Hemoglobin A1C: No results found for this basename: HGBA1C,  in the last 72 hours Fasting Lipid Panel: No results found for this basename: CHOL, HDL, LDLCALC, TRIG, CHOLHDL, LDLDIRECT,  in the last 72 hours Thyroid Function Tests: No results found for this basename: TSH, T4TOTAL, FREET3, T3FREE, THYROIDAB,  in the last 72 hours Anemia Panel: No results found for this basename: VITAMINB12, FOLATE, FERRITIN, TIBC, IRON, RETICCTPCT,  in the last 72 hours  PHYSICAL EXAM General: NAD Neck: JVP 7-8 cm, no thyromegaly or thyroid nodule.  Lungs: Bilateral rhonchi CV: Nondisplaced PMI.  Heart irregular S1/S2, no S3/S4, no murmur.  1+ edema 1/2 to knees bilaterally.   Abdomen: Soft, nontender, no hepatosplenomegaly, no distention.  Neurologic: Alert and oriented x 3.  Psych: Normal affect. Extremities: No clubbing or cyanosis.   TELEMETRY: Reviewed telemetry pt in atrial fibrillation with rate in 90s  ASSESSMENT AND PLAN: 80 yo with  history of chronic atrial fibrillation, COPD, and diastolic CHF admitted with respiratory failure, suspect combination of acute/chronic diastolic CHF and acute exacerbation of COPD.   1. Pulmonary: Suspect acute exacerbation COPD, possible viral bronchitis as instigator.  Remains on antibiotics and steroids per [facility].  2. Acute on chronic diastolic CHF: Volume status much improved.  Creatinine stable.  I am going to convert Lasix to po today. 3. Atrial fibrillation with RVR: HR stable, on warfarin and diltiazem CD. 4. Acute on chronic kidney injury: Creatinine improving now.  5. Epistaxis: Has been intractable.  ENT has been following.  INR 1.5 today, warfarin held.  Would also stop O2 by nasal cannula as long as sats are ok. He has history of CVA as well as recent RLE arterial thromboembolus.  He is at risk for further events off  anticoagulation.  Would minimize time off anticoagulation.  Once epistaxis has stopped, would restart coumadin without heparin bridge.    Marca Ancona 12/17/2013 11:29 AM

## 2013-12-17 NOTE — Progress Notes (Addendum)
OLD blood from from left nare, cleaned and client does not c/o blood running down throat.  No active bleeding noted.  Packing remains in right nare.  Nasal spray as ordered.  Oxygen saturation normal.  Alert and oriented.  To xray via bed with O2 via nasal cannula.  1150 Dr. Elvera LennoxGherghe notified of continued drainage from nose. He has been In to evaluate patient.  Dr. Shirlee LatchMcLean removed oxygen and put coumadin on hold.

## 2013-12-17 NOTE — Progress Notes (Signed)
Bilateral lower extremity venous duplex completed.  Right:  DVT noted in the posterior tibial vein.  No evidence of superficial thrombosis.  No Baker's cyst.  Left:  No evidence of DVT, superficial thrombosis, or Baker's cyst.     

## 2013-12-17 NOTE — Progress Notes (Signed)
PROGRESS NOTE  [name] [name] WUJ:[id number] DOB: 1931/01/24 DOA: 12/10/2013 PCP: No PCP Per Patient  Assessment/Plan: Epistaxis - noted events 1/2 overnight, appreciate ENT consult.  - patient with intermittent nose bleeds today. I discussed with dr. Annalee Genta and this is somewhat expected and the packing needs not be removed now but wait until Wed or Thu and removed at that time. Ideally patient would be off of anticoagulation for as long as possible to allow it to heal.  Recent DVT RLE  - diagnosed at the beginning of last December; on Coumadin, however with recent Epistaxis.  - will repeat DVT US. If negative, may be able to go off of anticoagulation for 1 week until his packing comes out and he is stable and restart; if possible, will discuss IVC filter vs continuing anticoagulation if Epistaxis stable. Discussed with Dr. Shirlee Latch and will try to minimize time off anticoagulation as possible.  Acute respiratory failure with hypoxia  - multifactorial likely due to COPD exacerbation, acute diastolic CHF exacerbation, HCAP  - management per Cardiology, improving  - Slow steroid taper, solu-medrol discontinued now on 10 BID.   - cont nebs and Robittusin DM and flutter valve  - chronically on 2 L of O2 at home  - started on Vanc and Zosyn on 12/28 by PCCM; de-escalate to Levofloxacin 1/2  - repeat CXR 1/4 am with pneumonia. Continue antibiotics.  Atrial fibrillation with RVR  - currently controlled on Amiodarone. - management per Cardiology  AKI on CKD 3 - cont to follow with diuresis - crt stable, 1.5>1.62>1.54>1.47>1.43 Recent embolic event right LE - s/p embolectomy  Diabetes mellitus - non-insulin requiring - CBG elevated due to steroids  - sliding scale  - taper steroids, 10 BID Hyperlipidemia  BPH - cont Hytrin  H/O CVA  Diet: carb Fluids: none DVT Prophylaxis: on Coumadin  Code Status: Full Family Communication: none  Disposition Plan: SNF when  ready  Consultants:  Cardiology  PCCM  ENT  Procedures:  none   Antibiotics 12/28 vanc >> 1/2 12/28 zosyn >> 1/2 1/2 Levofloxacin >   HPI/Subjective: - feeling better  Objective: Filed Vitals:   12/16/13 2218 12/17/13 0449 12/17/13 0854 12/17/13 1033  BP: 142/78 135/84  127/84  Pulse: 53 73    Temp: 97.9 F (36.6 C) 97.7 F (36.5 C)    TempSrc: Oral Oral    Resp: 18 18    Height:      Weight:  86.637 kg (191 lb)    SpO2: 97% 100% 100%     Intake/Output Summary (Last 24 hours) at 12/17/13 1105 Last data filed at 12/17/13 1050  Gross per 24 hour  Intake    360 ml  Output   4700 ml  Net  -4340 ml   Filed Weights   12/16/13 0718 12/16/13 0733 12/17/13 0449  Weight: 90.084 kg (198 lb 9.6 oz) 90.26 kg (198 lb 15.8 oz) 86.637 kg (191 lb)   Exam:  General:  NAD  Cardiovascular: irregular, no JVD  Respiratory: good air movement, clear to auscultation throughout, no wheezing, ronchi or rales  Abdomen: soft, not tender to palpation, positive bowel sounds  MSK: 1+ peripheral edema  Neuro: CN 2-12 grossly intact, MS 5/5 in all 4  Data Reviewed: Basic Metabolic Panel:  Recent Labs Lab 12/11/13 0330  12/13/13 0534 12/14/13 0530 12/15/13 0543 12/16/13 0410 12/17/13 0430  NA 134*  < > 142 140 142 138 139  K 4.6  < > 3.6* 3.8 4.1 4.5 4.6  CL 95*  < > 97 94* 96 96 95*  CO2 29  < > 29 29 31 26  33*  GLUCOSE 126*  < > 223* 271* 254* 268* 248*  BUN 32*  < > 39* 42* 40* 46* 42*  CREATININE 1.70*  < > 1.50* 1.62* 1.54* 1.47* 1.43*  CALCIUM 9.0  < > 8.2* 8.4 8.6 8.8 9.1  MG 1.9  --   --   --   --   --   --   < > = values in this interval not displayed. Liver Function Tests:  Recent Labs Lab 12/11/13 0330  AST 24  ALT 16  ALKPHOS 76  BILITOT 1.0  PROT 6.3  ALBUMIN 2.9*   CBC:  Recent Labs Lab 12/10/13 1630 12/11/13 0330 12/14/13 0530 12/16/13 0410  WBC 6.7 5.9 7.3 12.2*  HGB 10.1* 10.2* 11.0* 11.5*  HCT 31.3* 31.2* 33.9* 35.8*  MCV 94.8  93.7 93.6 93.7  PLT 256 254 220 233   Cardiac Enzymes:  Recent Labs Lab 12/10/13 1630  TROPONINI <0.30   BNP (last 3 results)  Recent Labs  12/10/13 1630  PROBNP 5532.0*   CBG:  Recent Labs Lab 12/16/13 0619 12/16/13 1043 12/16/13 1607 12/16/13 2203 12/17/13 0632  GLUCAP 238* 239* 267* 218* 236*    Recent Results (from the past 240 hour(s))  MRSA PCR SCREENING     Status: None   Collection Time    12/10/13 12:09 PM      Result Value Range Status   MRSA by PCR NEGATIVE  NEGATIVE Final   Comment:            The GeneXpert MRSA Assay (FDA     approved for NASAL specimens     only), is one component of a     comprehensive MRSA colonization     surveillance program. It is not     intended to diagnose MRSA     infection nor to guide or     monitor treatment for     MRSA infections.  URINE CULTURE     Status: None   Collection Time    12/10/13  6:02 PM      Result Value Range Status   Specimen Description URINE, CATHETERIZED   Final   Special Requests NONE   Final   Culture  Setup Time     Final   Value: 12/11/2013 01:10     Performed at [facility] Count     Final   Value: 50,000 COLONIES/ML     Performed at [facility]   Culture     Final   Value: ESCHERICHIA COLI     Performed at [facility]   Report Status 12/13/2013 FINAL   Final   Organism ID, Bacteria ESCHERICHIA COLI   Final   Studies: No results found.  Scheduled Meds: . antiseptic oral rinse  15 mL Mouth Rinse q12n4p  . atorvastatin  10 mg Oral Daily  . diltiazem  240 mg Oral Daily  . famotidine  20 mg Oral QHS  . furosemide  80 mg Intravenous BID  . guaiFENesin  600 mg Oral BID  . insulin aspart  0-9 Units Subcutaneous TID WC  . levalbuterol  0.63 mg Nebulization TID  . levofloxacin (LEVAQUIN) IV  750 mg Intravenous Q48H  . oxymetazoline  1 spray Each Nare BID  . potassium chloride  40 mEq Oral Daily  . predniSONE  10 mg Oral BID  WC  . sodium  chloride  4 spray Each Nare 6 X Daily  . Warfarin - Pharmacist Dosing Inpatient   Does not apply q1800   Continuous Infusions:   Principal Problem:   Acute respiratory failure with hypoxia Active Problems:   Atrial fibrillation   H/O: CVA (cerebrovascular accident)   COPD (chronic obstructive pulmonary disease)   Anasarca   Acute on chronic diastolic heart failure   Acute renal failure   Anemia   UTI (urinary tract infection)   Diabetes mellitus with hypoglycemia   Acute on chronic diastolic CHF (congestive heart failure)   Peripheral vascular disease   Acute on chronic diastolic congestive heart failure  Time spent: 35  [name] Pertostin Zannie Locastro, MD [facility] Pager [phone]. If 7 PM - 7 AM, please contact night-coverage at [email or web address], password [email or web address] 12/17/2013, 11:05 AM  LOS: 7 days

## 2013-12-17 NOTE — Progress Notes (Signed)
ANTICOAGULATION CONSULT NOTE - Follow-up  Pharmacy Consult for warfarin Indication: atrial fibrillation  No Known Allergies  Patient Measurements: Height: 6' (182.9 cm) Weight: 191 lb (86.637 kg) (BEDSCALE) IBW/kg (Calculated) : 77.6  Vital Signs: Temp: 97.7 F (36.5 C) (01/04 0449) Temp src: Oral (01/04 0449) BP: 127/84 mmHg (01/04 1033) Pulse Rate: 73 (01/04 0449)  Labs:  Recent Labs  12/15/13 0543 12/16/13 0410 12/16/13 0614 12/17/13 0430  HGB  --  11.5*  --   --   HCT  --  35.8*  --   --   PLT  --  233  --   --   LABPROT 20.0*  --  18.6* 18.2*  INR 1.76*  --  1.60* 1.55*  CREATININE 1.54* 1.47*  --  1.43*    Estimated Creatinine Clearance: 45.2 ml/min (by C-G formula based on Cr of 1.43).  Assessment: 82 YOM continues on warfarin for afib. Patient was switched to levofloxacin on 1/3 which can potentiate INR.  INR SUbtherapeutic today at 1.55 and CBC stable but patient has been noted to have intractable epistaxis. Will hold warfarin for now until epistaxis adequately resolved.  Warfarin PTA 2.5mg  daily  Goal of Therapy:  INR 2-3 Monitor platelets by anticoagulation protocol: Yes  Plan:  1. Hold warfarin tonight 2. Continue to monitor daily PT/INR, s/s bleeding 3. F/u plans to restart  Margie BilletErika K. von Vajna, PharmD Clinical Pharmacist - Resident Pager: [phone] Pharmacy: [phone] 12/17/2013 1:29 PM

## 2013-12-18 DIAGNOSIS — R5381 Other malaise: Secondary | ICD-10-CM

## 2013-12-18 DIAGNOSIS — J9600 Acute respiratory failure, unspecified whether with hypoxia or hypercapnia: Secondary | ICD-10-CM

## 2013-12-18 DIAGNOSIS — I82409 Acute embolism and thrombosis of unspecified deep veins of unspecified lower extremity: Secondary | ICD-10-CM

## 2013-12-18 LAB — GLUCOSE, CAPILLARY
GLUCOSE-CAPILLARY: 234 mg/dL — AB (ref 70–99)
Glucose-Capillary: 234 mg/dL — ABNORMAL HIGH (ref 70–99)
Glucose-Capillary: 176 mg/dL — ABNORMAL HIGH (ref 70–99)
Glucose-Capillary: 253 mg/dL — ABNORMAL HIGH (ref 70–99)

## 2013-12-18 LAB — BASIC METABOLIC PANEL
BUN: 36 mg/dL — AB (ref 6–23)
CALCIUM: 9.5 mg/dL (ref 8.4–10.5)
CO2: 29 mEq/L (ref 19–32)
CREATININE: 1.3 mg/dL (ref 0.50–1.35)
Chloride: 91 mEq/L — ABNORMAL LOW (ref 96–112)
GFR calc non Af Amer: 50 mL/min — ABNORMAL LOW (ref >90)
GFR, EST AFRICAN AMERICAN: 58 mL/min — AB (ref >90)
Glucose, Bld: 234 mg/dL — ABNORMAL HIGH (ref 70–99)
Potassium: 4.5 mEq/L (ref 3.7–5.3)
Sodium: 136 mEq/L — ABNORMAL LOW (ref 137–147)

## 2013-12-18 LAB — CBC
HEMATOCRIT: 34.8 % — AB (ref 39.0–52.0)
Hemoglobin: 11.6 g/dL — ABNORMAL LOW (ref 13.0–17.0)
MCH: 30.4 pg (ref 26.0–34.0)
MCHC: 33.3 g/dL (ref 30.0–36.0)
MCV: 91.3 fL (ref 78.0–100.0)
PLATELETS: 212 10*3/uL (ref 150–400)
RBC: 3.81 MIL/uL — ABNORMAL LOW (ref 4.22–5.81)
RDW: 14.1 % (ref 11.5–15.5)
WBC: 12.1 10*3/uL — ABNORMAL HIGH (ref 4.0–10.5)

## 2013-12-18 LAB — PROTIME-INR
INR: 1.53 — ABNORMAL HIGH (ref 0.00–1.49)
PROTHROMBIN TIME: 18 s — AB (ref 11.6–15.2)

## 2013-12-18 MED ORDER — ENOXAPARIN SODIUM 80 MG/0.8ML ~~LOC~~ SOLN
1.0000 mg/kg | Freq: Two times a day (BID) | SUBCUTANEOUS | Status: DC
  Administered 2013-12-18 – 2013-12-20 (×3): 80 mg via SUBCUTANEOUS
  Filled 2013-12-18 (×6): qty 0.8

## 2013-12-18 MED ORDER — WARFARIN SODIUM 5 MG PO TABS
5.0000 mg | ORAL_TABLET | Freq: Once | ORAL | Status: AC
  Administered 2013-12-18: 5 mg via ORAL
  Filled 2013-12-18: qty 1

## 2013-12-18 MED ORDER — ENOXAPARIN SODIUM 80 MG/0.8ML ~~LOC~~ SOLN
80.0000 mg | Freq: Two times a day (BID) | SUBCUTANEOUS | Status: DC
  Filled 2013-12-18 (×2): qty 0.8

## 2013-12-18 NOTE — Progress Notes (Signed)
Inpatient Diabetes Program Recommendations  AACE/ADA: New Consensus Statement on Inpatient Glycemic Control (2013)  Target Ranges:  Prepandial:   less than 140 mg/dL      Peak postprandial:   less than 180 mg/dL (1-2 hours)      Critically ill patients:  140 - 180 mg/dL   Inpatient Diabetes Program Recommendations Insulin - Basal: consider adding low dose basal during steroid therapy Thank you  Piedad ClimesGina Marsela Kuan BSN, RN,CDE Inpatient Diabetes Coordinator [phone] (team pager)

## 2013-12-18 NOTE — Progress Notes (Addendum)
ANTICOAGULATION CONSULT NOTE - Follow-up  Pharmacy Consult for warfarin Indication: atrial fibrillation  No Known Allergies  Patient Measurements: Height: 6' (182.9 cm) Weight: 179 lb 0.2 oz (81.2 kg) (bed scale) IBW/kg (Calculated) : 77.6  Vital Signs: Temp: 97.7 F (36.5 C) (01/05 0520) Temp src: Oral (01/05 0520) BP: 117/57 mmHg (01/05 0520) Pulse Rate: 80 (01/05 0520)  Labs:  Recent Labs  12/16/13 0410 12/16/13 0614 12/17/13 0430 12/18/13 0642  HGB 11.5*  --   --  11.6*  HCT 35.8*  --   --  34.8*  PLT 233  --   --  212  LABPROT  --  18.6* 18.2* 18.0*  INR  --  1.60* 1.55* 1.53*  CREATININE 1.47*  --  1.43*  --     Estimated Creatinine Clearance: 45.2 ml/min (by C-G formula based on Cr of 1.43).  Assessment: 82 YOM continues on warfarin for afib. INR SUbtherapeutic today at 1.53 and CBC stable but patient has been noted to have intractable epistaxis. Will hold warfarin for now until epistaxis adequately resolved. Lovenox has been added as bridge.  Warfarin PTA 2.5mg  daily  Goal of Therapy:  INR 2-3 Monitor platelets by anticoagulation protocol: Yes  Plan:   Hold coumadin F/u restart when ok with cards Consider stopping levaquin after 7-8 days Lovenox as bridge for DVT treatment while coumadin is being held.                     80 mg SQ q12hrs until INR>2 or another anticoagulant has been started.

## 2013-12-18 NOTE — Progress Notes (Signed)
UR completed Nyree Yonker K. Patric Buckhalter, RN, BSN, MSHL, CCM  12/18/2013 12:56 PM

## 2013-12-18 NOTE — Progress Notes (Signed)
Patient ID: [name] [name], male   DOB: 10-05-1931, 80 y.o.   MRN: [id number]   SUBJECTIVE:   80 y/o male admitted with a/c respiratory failure in the setting of acute bronchitis and a/c diastolic HF. HR controlled in 80s (atrial fibrillation). 24 hr I/O -4.6 liters and weight down 12 lbs. No audible wheezing today. Sitting up in chair and feels great. He has been having intractable epistaxis and warfarin has been held. Seen by ENT and was packed. [place] yesterday showed Right DVT in posterior tibial vein. Last month treated with LE embolectomy for acute arterial embolism in the setting of AF. Cr stable.   Warfarin to be restarted today without heparin bridge.   Marland Kitchen antiseptic oral rinse  15 mL Mouth Rinse q12n4p  . atorvastatin  10 mg Oral Daily  . diltiazem  240 mg Oral Daily  . famotidine  20 mg Oral QHS  . furosemide  80 mg Oral BID  . guaiFENesin  600 mg Oral BID  . insulin aspart  0-9 Units Subcutaneous TID WC  . levalbuterol  0.63 mg Nebulization TID  . levofloxacin  750 mg Oral Q48H  . oxymetazoline  1 spray Each Nare BID  . potassium chloride  40 mEq Oral Daily  . predniSONE  10 mg Oral BID WC  . sodium chloride  4 spray Each Nare 6 X Daily  . Warfarin - Pharmacist Dosing Inpatient   Does not apply q1800    Filed Vitals:   12/17/13 1443 12/17/13 2117 12/18/13 0520 12/18/13 0741  BP: 116/60 157/83 117/57   Pulse: 74 86 80   Temp: 97.6 F (36.4 C) 97.5 F (36.4 C) 97.7 F (36.5 C)   TempSrc: Oral Oral Oral   Resp: 18 18 20    Height:      Weight:   179 lb 0.2 oz (81.2 kg)   SpO2: 100% 93% 92% 92%    Intake/Output Summary (Last 24 hours) at 12/18/13 0920 Last data filed at 12/18/13 0850  Gross per 24 hour  Intake    480 ml  Output   5150 ml  Net  -4670 ml    LABS: Basic Metabolic Panel:  Recent Labs  40/98/11 0430 12/18/13 0642  NA 139 136*  K 4.6 4.5  CL 95* 91*  CO2 33* 29  GLUCOSE 248* 234*  BUN 42* 36*  CREATININE 1.43* 1.30  CALCIUM 9.1 9.5   Liver  Function Tests: No results found for this basename: AST, ALT, ALKPHOS, BILITOT, PROT, ALBUMIN,  in the last 72 hours No results found for this basename: LIPASE, AMYLASE,  in the last 72 hours CBC:  Recent Labs  12/16/13 0410 12/18/13 0642  WBC 12.2* 12.1*  HGB 11.5* 11.6*  HCT 35.8* 34.8*  MCV 93.7 91.3  PLT 233 212   Cardiac Enzymes: No results found for this basename: CKTOTAL, CKMB, CKMBINDEX, TROPONINI,  in the last 72 hours BNP: No components found with this basename: POCBNP,  D-Dimer: No results found for this basename: DDIMER,  in the last 72 hours Hemoglobin A1C: No results found for this basename: HGBA1C,  in the last 72 hours Fasting Lipid Panel: No results found for this basename: CHOL, HDL, LDLCALC, TRIG, CHOLHDL, LDLDIRECT,  in the last 72 hours Thyroid Function Tests: No results found for this basename: TSH, T4TOTAL, FREET3, T3FREE, THYROIDAB,  in the last 72 hours Anemia Panel: No results found for this basename: VITAMINB12, FOLATE, FERRITIN, TIBC, IRON, RETICCTPCT,  in the last 72 hours  PHYSICAL  EXAM General: NAD. Elderly. Sitting in chair Neck: JVP 7-8 cm, no thyromegaly or thyroid nodule.  Lungs: Mild rhonchi CV: Nondisplaced PMI. Distant HS.Marland Kitchen. Heart irregular S1/S2, no S3/S4, no murmur. NO edema   Abdomen: Soft, nontender, no hepatosplenomegaly, no distention.  Neurologic: Alert and oriented x 3. LUE weakness and swelling Psych: Normal affect. Extremities: No clubbing or cyanosis.   TELEMETRY: Reviewed telemetry pt in atrial fibrillation with rate in 90s  ASSESSMENT AND PLAN: 80 yo with history of chronic atrial fibrillation, COPD, and diastolic CHF admitted with respiratory failure, suspect combination of acute/chronic diastolic CHF and acute exacerbation of COPD.   1. Pulmonary: Suspect acute exacerbation COPD, possible viral bronchitis as instigator.  Remains on antibiotics and steroids per [facility].  2. Acute on chronic diastolic CHF: Volume status much  improved, down 12 lbs. Creatinine stable. He is on PO lasix now and will continue. OOB with PT/OT. May need SNF when home.  3. Atrial fibrillation with RVR: HR stable, on diltiazem CD. Warfarin restarted.   4. Acute on chronic kidney injury: Creatinine stable 5. Epistaxis: Has been intractable.  ENT has been following.  INR 1.5 today. Warfarin was held, but is now restarted. No longer on O2 to help with nasal dryness. He has history of CVA as well as RLE DVT in posterior tibial vein.  He is at risk for further events off anticoagulation. No heparin bridge. Aundria RudCosgrove, [name] 12/18/2013 9:20 AM   Patient seen and examined with Ulla PotashAli Cosgrove, NP. We discussed all aspects of the encounter. I agree with the assessment and plan as stated above.   Looks much better. Given recent arterial embolism and current DVT in the setting of AF and previous stroke, I feel strongly that he needs anti-coagulation despite epistaxis. Long talk about switching to NOAC. Given bleeding profile, I prefer Eliquis but would also be fine with Xarelto (at DVT dosing). We will d/w Case Management.   He is unable to go home at this point due to debility. Will place CIR consult. If not, candidate he would be interested in going to [facility]. Would keep in hospital at tleast one more day until we can get these issues straightened out and get him fully, and safely, anticoagulated.   Tequan Redmon,MD 9:47 AM

## 2013-12-18 NOTE — Progress Notes (Signed)
Patient is currently being evaluated by CIR per request of MD.  Awaiting notification from CIR regarding possible offer;  [facility]. If this is not able to be obtained, plan will be for patient to return to [facility]. Bed is available at [facility] tomorrow if needed.  CSW will montior.  Lorri Frederickonna T. West PughCrowder, LCSWA  [phone]

## 2013-12-18 NOTE — Progress Notes (Signed)
PROGRESS NOTE  [name] [name] NWG:[id number] DOB: [birth year] DOA: 12/10/2013 PCP: No PCP Per Patient  Assessment/Plan: Epistaxis - noted events 1/2 overnight, appreciate ENT consult.  - patient with intermittent nose bleeds, now resolved. Continue anticoagulation, benefits >> risks  Recent DVT RLE and arterial embolus s/p R femoral embolectomy and angioplasty December 2014 - diagnosed at the beginning of last December; on Coumadin, however with recent Epistaxis.  - DVT US positive, continue anticoagulation Acute respiratory failure with hypoxia  - multifactorial likely due to COPD exacerbation, acute diastolic CHF exacerbation, HCAP  - management per Cardiology, improving  - Slow steroid taper, solu-medrol discontinued now on 10 BID.   - cont nebs and Robittusin DM and flutter valve  - chronically on 2 L of O2 at home  - started on Vanc and Zosyn on 12/28 by PCCM; de-escalate to Levofloxacin 1/2  - repeat CXR 1/4 am with pneumonia. Continue antibiotics.  Atrial fibrillation with RVR  - currently controlled on Amiodarone. - management per Cardiology  AKI on CKD 3 - cont to follow with diuresis - crt stable, 1.5>1.62>1.54>1.47>1.43 Recent embolic event right LE - s/p embolectomy  Diabetes mellitus - non-insulin requiring - CBG elevated due to steroids  - sliding scale  - taper steroids, 10 BID Hyperlipidemia  BPH - cont Hytrin  H/O CVA  Diet: carb Fluids: none DVT Prophylaxis: on Coumadin  Code Status: Full Family Communication: none  Disposition Plan: SNF when ready  Consultants:  Cardiology  PCCM  ENT  Procedures:  none   Antibiotics 12/28 vanc >> 1/2 12/28 zosyn >> 1/2 1/2 Levofloxacin >   HPI/Subjective: - feeling better  Objective: Filed Vitals:   12/18/13 0520 12/18/13 0741 12/18/13 1029 12/18/13 1419  BP: 117/57  127/53 115/59  Pulse: 80  84 86  Temp: 97.7 F (36.5 C)  96.8 F (36 C) 97.2 F (36.2 C)  TempSrc: Oral  Oral Oral  Resp: 20  20 20    Height:      Weight: 81.2 kg (179 lb 0.2 oz)     SpO2: 92% 92% 95% 92%    Intake/Output Summary (Last 24 hours) at 12/18/13 1604 Last data filed at 12/18/13 1427  Gross per 24 hour  Intake    720 ml  Output   4850 ml  Net  -4130 ml   Filed Weights   12/16/13 0733 12/17/13 0449 12/18/13 0520  Weight: 90.26 kg (198 lb 15.8 oz) 86.637 kg (191 lb) 81.2 kg (179 lb 0.2 oz)   Exam:  General:  NAD  Cardiovascular: irregular, no JVD  Respiratory: good air movement, clear to auscultation throughout, no wheezing, ronchi or rales  Abdomen: soft, not tender to palpation, positive bowel sounds  MSK: 1+ peripheral edema  Neuro: CN 2-12 grossly intact, MS 5/5 in all 4  Data Reviewed: Basic Metabolic Panel:  Recent Labs Lab 12/14/13 0530 12/15/13 0543 12/16/13 0410 12/17/13 0430 12/18/13 0642  NA 140 142 138 139 136*  K 3.8 4.1 4.5 4.6 4.5  CL 94* 96 96 95* 91*  CO2 29 31 26  33* 29  GLUCOSE 271* 254* 268* 248* 234*  BUN 42* 40* 46* 42* 36*  CREATININE 1.62* 1.54* 1.47* 1.43* 1.30  CALCIUM 8.4 8.6 8.8 9.1 9.5   Liver Function Tests: No results found for this basename: AST, ALT, ALKPHOS, BILITOT, PROT, ALBUMIN,  in the last 168 hours CBC:  Recent Labs Lab 12/14/13 0530 12/16/13 0410 12/18/13 0642  WBC 7.3 12.2* 12.1*  HGB 11.0* 11.5* 11.6*  HCT 33.9* 35.8* 34.8*  MCV 93.6 93.7 91.3  PLT 220 233 212   Cardiac Enzymes: No results found for this basename: CKTOTAL, CKMB, CKMBINDEX, TROPONINI,  in the last 168 hours BNP (last 3 results)  Recent Labs  12/10/13 1630  PROBNP 5532.0*   CBG:  Recent Labs Lab 12/17/13 1123 12/17/13 1556 12/17/13 2115 12/18/13 0705 12/18/13 1053  GLUCAP 224* 277* 234* 234* 176*    Recent Results (from the past 240 hour(s))  MRSA PCR SCREENING     Status: None   Collection Time    12/10/13 12:09 PM      Result Value Range Status   MRSA by PCR NEGATIVE  NEGATIVE Final   Comment:            The GeneXpert MRSA Assay (FDA      approved for NASAL specimens     only), is one component of a     comprehensive MRSA colonization     surveillance program. It is not     intended to diagnose MRSA     infection nor to guide or     monitor treatment for     MRSA infections.  URINE CULTURE     Status: None   Collection Time    12/10/13  6:02 PM      Result Value Range Status   Specimen Description URINE, CATHETERIZED   Final   Special Requests NONE   Final   Culture  Setup Time     Final   Value: 12/11/2013 01:10     Performed at [facility] Count     Final   Value: 50,000 COLONIES/ML     Performed at [facility]   Culture     Final   Value: ESCHERICHIA COLI     Performed at [facility]   Report Status 12/13/2013 FINAL   Final   Organism ID, Bacteria ESCHERICHIA COLI   Final   Studies: Dg Chest 2 View  12/17/2013   CLINICAL DATA:  Evaluate for pneumonia.  Status post diuresis.  EXAM: CHEST  2 VIEW  COMPARISON:  Chest x-ray 12/11/2013.  FINDINGS: Patchy areas of peribronchial cuffing and patchy multifocal asymmetrically distributed airspace opacities, most severe in the mid to lower lungs bilaterally (right greater than left), concerning for multilobar bronchopneumonia. No evidence of pulmonary edema. Heart size is mildly enlarged. Upper mediastinal contours are within normal limits. Atherosclerosis in the thoracic aorta.  IMPRESSION: 1. Findings, as above, concerning for multilobar bronchopneumonia. 2. Mild cardiomegaly. 3. Atherosclerosis.   Electronically Signed   By: Trudie Reed M.D.   On: 12/17/2013 12:34    Scheduled Meds: . antiseptic oral rinse  15 mL Mouth Rinse q12n4p  . atorvastatin  10 mg Oral Daily  . diltiazem  240 mg Oral Daily  . enoxaparin (LOVENOX) injection  1 mg/kg Subcutaneous Q12H  . enoxaparin (LOVENOX) injection  80 mg Subcutaneous Q12H  . famotidine  20 mg Oral QHS  . furosemide  80 mg Oral BID  . guaiFENesin  600 mg Oral BID  . insulin aspart   0-9 Units Subcutaneous TID WC  . levalbuterol  0.63 mg Nebulization TID  . levofloxacin  750 mg Oral Q48H  . oxymetazoline  1 spray Each Nare BID  . potassium chloride  40 mEq Oral Daily  . predniSONE  10 mg Oral BID WC  . sodium chloride  4 spray Each Nare 6 X Daily  . warfarin  5 mg Oral ONCE-1800  . Warfarin - Pharmacist Dosing Inpatient   Does not apply q1800   Continuous Infusions:   Principal Problem:   Acute respiratory failure with hypoxia Active Problems:   Atrial fibrillation   H/O: CVA (cerebrovascular accident)   COPD (chronic obstructive pulmonary disease)   Anasarca   Acute on chronic diastolic heart failure   Acute renal failure   Anemia   UTI (urinary tract infection)   Diabetes mellitus with hypoglycemia   Acute on chronic diastolic CHF (congestive heart failure)   Peripheral vascular disease   Acute on chronic diastolic congestive heart failure   DVT (deep venous thrombosis)  Time spent: 25  Pamella Pertostin Gherghe, MD [facility] Pager [phone]. If 7 PM - 7 AM, please contact night-coverage at [email or web address], password [email or web address] 12/18/2013, 4:04 PM  LOS: 8 days

## 2013-12-18 NOTE — Progress Notes (Signed)
I met with pt at bedside and contacted his wife by phone. We discussed inpt rehab venue as an option pending his [employer] medicare insurance approval. I have begun authorization, but doubt approval. Patient and wife are aware. [phone] 

## 2013-12-18 NOTE — Consult Note (Signed)
Physical Medicine and Rehabilitation Consult  Reason for Consult: Deconditioning Referring Physician: Dr. Gala Romney.    HPI: [name] [name] is a 80 y.o. male with history of COPD--O2 at nights, h/o CVA with L-HP and memory deficits, chronic CHF,  A fib with RVR as well as  recent admission for ischemic RLE requiring R- femoral embolectomy due to embolic event.  [name] on 12/10/13 with worsening of dyspnea, anasarca, acute renal failure and left foot pain. He was treated for acute on chronic renal failure with IV diuresis and started on antibiotics for AECOPD v/s viral bronchitis.  Required BIPAP due to mucous plugging. Treated with nebs and steroids and respiratory status improving. He developed acute epistaxis on 01/02/124 treated with chemical cautery and nasal packing by Dr. Annalee Genta with recommendations for continued antibiotic coverage. Coumadin held till epistaxis resolved and cardiology recommending switching to new NOAC. Patient significantly deconditioned and MD, patient and family requesting CIR.    Review of Systems  HENT: Negative for hearing loss.   Eyes: Negative for blurred vision and double vision.  Respiratory: Positive for cough, shortness of breath and wheezing.   Cardiovascular: Negative for chest pain and palpitations.  Gastrointestinal: Negative for vomiting and abdominal pain.  Genitourinary: Negative for urgency and frequency.  Musculoskeletal: Positive for myalgias.  Neurological: Positive for focal weakness (chronic Left hemiparesis--wears upright [name].). Negative for headaches.    Past Medical History  Diagnosis Date  . Hypertension   . Stroke   . COPD (chronic obstructive pulmonary disease)   . Diabetes mellitus without complication   . GERD (gastroesophageal reflux disease)   . A-fib    Past Surgical History  Procedure Laterality Date  . Carotid stent    . Parotid gland tumor excision    . Embolectomy Right 11/23/2013    Procedure: Right  Femoral EMBOLECTOMY;  Surgeon: Chuck Hint, MD;  Location: [facility] OR;  Service: Vascular;  Laterality: Right;  Right Femoral Embolectomy with Bovine Paricardium patch angioplasty.   History reviewed. No pertinent family history.  Social History:  Married. Used to work in a [employer]. Independent prior to December admission. He reports that he has quit smoking 17 years ago. He does not have any smokeless tobacco history on file. He reports that he does not drink alcohol or use illicit drugs.  Allergies: No Known Allergies  Medications Prior to Admission  Medication Sig Dispense Refill  . atorvastatin (LIPITOR) 10 MG tablet Take 10 mg by mouth daily.      . benazepril (LOTENSIN) 10 MG tablet Take 10 mg by mouth 2 (two) times daily.      . furosemide (LASIX) 40 MG tablet Take 40-60 mg by mouth 2 (two) times daily. Takes 60mg  in morning and 40mg  in evening      . glipiZIDE (GLUCOTROL XL) 10 MG 24 hr tablet Take 10 mg by mouth 2 (two) times daily.      Marland Kitchen levalbuterol (XOPENEX) 0.63 MG/3ML nebulizer solution Take 3 mLs (0.63 mg total) by nebulization every 6 (six) hours as needed for wheezing or shortness of breath.      . metoprolol (LOPRESSOR) 50 MG tablet Take 50 mg by mouth 2 (two) times daily.      . potassium chloride SA (K-DUR,KLOR-CON) 20 MEQ tablet Take 20 mEq by mouth daily.      Marland Kitchen terazosin (HYTRIN) 5 MG capsule Take 5 mg by mouth every other day.        Home: Home Living Family/patient expects to be  discharged to:: Skilled nursing facility Living Arrangements: Spouse/significant other  Functional History: Prior Function Comments: Pt reports that wife provided assist for BADLs for some time.  He has not been ambulatory since prior to previous admission.  Prior to discharge to SNF 12/23, he was pivoting to chair with max A, and he reports that SNF staff used a lift to transfer him to w/c only  Functional Status:  Mobility: Bed Mobility Bed Mobility: Left Sidelying to  Sit;Rolling Left;Sitting - Scoot to Edge of Bed Rolling Left: 4: Min assist;With rail Left Sidelying to Sit: 3: Mod assist;HOB elevated;With rails Supine to Sit: 3: Mod assist;HOB elevated;With rails Sitting - Scoot to Edge of Bed: 2: Max assist;With rail Sitting - Scoot to [place] of Bed: Patient Percentage: 40% Sit to Supine: 2: Max assist;HOB flat Scooting to HOB: 1: +1 Total assist Transfers Transfers: Stand Pivot Transfers;Sit to Stand;Stand to Sit Sit to Stand: 1: +2 Total assist;From bed Sit to Stand: Patient Percentage: 40% Stand to Sit: 1: +2 Total assist Stand to Sit: Patient Percentage: 30% Stand Pivot Transfers: 1: +2 Total assist Stand Pivot Transfers: Patient Percentage: 40% Ambulation/Gait Ambulation/Gait Assistance: Not tested (comment)    ADL: ADL Eating/Feeding: Minimal assistance Where Assessed - Eating/Feeding: Bed level Grooming: Wash/dry face;Wash/dry hands;Minimal assistance Where Assessed - Grooming: Supported sitting Upper Body Bathing: +1 Total assistance Where Assessed - Upper Body Bathing: Supported sitting Lower Body Bathing: +1 Total assistance Where Assessed - Lower Body Bathing: Supine, head of bed up;Rolling right and/or left Upper Body Dressing: +1 Total assistance Where Assessed - Upper Body Dressing: Supported sitting Lower Body Dressing: +1 Total assistance Where Assessed - Lower Body Dressing: Supine, head of bed up;Supine, head of bed flat;Rolling right and/or left Toilet Transfer: +1 Total assistance (unable ) Transfers/Ambulation Related to ADLs: Pt unable to move sit to stand with total A +1 ADL Comments: Pt limited by dyspnea and fatigue as well as weakness  Cognition: Cognition Overall Cognitive Status: History of cognitive impairments - at baseline Orientation Level: Oriented X4 Cognition Arousal/Alertness: Awake/alert Behavior During Therapy: WFL for tasks assessed/performed Overall Cognitive Status: History of cognitive  impairments - at baseline Memory: Decreased short-term memory  Blood pressure 117/57, pulse 80, temperature 97.7 F (36.5 C), temperature source Oral, resp. rate 20, height 6' (1.829 m), weight 81.2 kg (179 lb 0.2 oz), SpO2 92.00%. Physical Exam  Nursing note and vitals reviewed. Constitutional: He is oriented to person, place, and time. He appears well-developed and well-nourished.  HENT:  Head: Normocephalic and atraumatic.  Nose packed  Eyes: Pupils are equal, round, and reactive to light. Right conjunctiva is injected.  Neck: Normal range of motion. Neck supple.  Cardiovascular: Normal rate.   Respiratory: Effort normal. No respiratory distress. He has wheezes. He has rales. He exhibits no tenderness.  Congested cough.   GI: Soft. Bowel sounds are normal. He exhibits no distension. There is no tenderness.  Musculoskeletal: He exhibits no edema.  Neurological: He is alert and oriented to person, place, and time.  Reasonable insight and awareness. Slightly HOH. LUE is 3+ prox to 4- distally at hand. LLE is 2/5 proximally  to 3+   distally, moving all muscle groups. Sensation grossly intact to pin prick and light touch on the left. RUE is 4 to 4+/5. RLE is 3 HF to 4/5 distally. Mild left central 7 seen. Speech slightly dysarthric.  Skin: Skin is warm and dry.  Psychiatric: He has a normal mood and affect. His behavior is normal.  Results for orders placed during the hospital encounter of 12/10/13 (from the past 24 hour(s))  GLUCOSE, CAPILLARY     Status: Abnormal   Collection Time    12/17/13 11:23 AM      Result Value Range   Glucose-Capillary 224 (*) 70 - 99 mg/dL  GLUCOSE, CAPILLARY     Status: Abnormal   Collection Time    12/17/13  3:56 PM      Result Value Range   Glucose-Capillary 277 (*) 70 - 99 mg/dL   Comment 1 Documented in Chart     Comment 2 Notify RN    GLUCOSE, CAPILLARY     Status: Abnormal   Collection Time    12/17/13  9:15 PM      Result Value Range    Glucose-Capillary 234 (*) 70 - 99 mg/dL   Comment 1 Notify RN     Comment 2 Documented in Chart    PROTIME-INR     Status: Abnormal   Collection Time    12/18/13  6:42 AM      Result Value Range   Prothrombin Time 18.0 (*) 11.6 - 15.2 seconds   INR 1.53 (*) 0.00 - 1.49  BASIC METABOLIC PANEL     Status: Abnormal   Collection Time    12/18/13  6:42 AM      Result Value Range   Sodium 136 (*) 137 - 147 mEq/L   Potassium 4.5  3.7 - 5.3 mEq/L   Chloride 91 (*) 96 - 112 mEq/L   CO2 29  19 - 32 mEq/L   Glucose, Bld 234 (*) 70 - 99 mg/dL   BUN 36 (*) 6 - 23 mg/dL   Creatinine, Ser 1.611.30  0.50 - 1.35 mg/dL   Calcium 9.5  8.4 - 09.610.5 mg/dL   GFR calc non Af Amer 50 (*) >90 mL/min   GFR calc Af Amer 58 (*) >90 mL/min  CBC     Status: Abnormal   Collection Time    12/18/13  6:42 AM      Result Value Range   WBC 12.1 (*) 4.0 - 10.5 K/uL   RBC 3.81 (*) 4.22 - 5.81 MIL/uL   Hemoglobin 11.6 (*) 13.0 - 17.0 g/dL   HCT 04.534.8 (*) 40.939.0 - 81.152.0 %   MCV 91.3  78.0 - 100.0 fL   MCH 30.4  26.0 - 34.0 pg   MCHC 33.3  30.0 - 36.0 g/dL   RDW 91.414.1  78.211.5 - 95.615.5 %   Platelets 212  150 - 400 K/uL  GLUCOSE, CAPILLARY     Status: Abnormal   Collection Time    12/18/13  7:05 AM      Result Value Range   Glucose-Capillary 234 (*) 70 - 99 mg/dL   Comment 1 Notify RN     Comment 2 Documented in Chart     Dg Chest 2 View  12/17/2013   CLINICAL DATA:  Evaluate for pneumonia.  Status post diuresis.  EXAM: CHEST  2 VIEW  COMPARISON:  Chest x-ray 12/11/2013.  FINDINGS: Patchy areas of peribronchial cuffing and patchy multifocal asymmetrically distributed airspace opacities, most severe in the mid to lower lungs bilaterally (right greater than left), concerning for multilobar bronchopneumonia. No evidence of pulmonary edema. Heart size is mildly enlarged. Upper mediastinal contours are within normal limits. Atherosclerosis in the thoracic aorta.  IMPRESSION: 1. Findings, as above, concerning for multilobar  bronchopneumonia. 2. Mild cardiomegaly. 3. Atherosclerosis.   Electronically Signed   By: Reuel Boomaniel  Entrikin M.D.   On: 12/17/2013 12:34    Assessment/Plan: Diagnosis: deconditioning related acute renal failure, hypoxia, and multiple medical issues 1. Does the need for close, 24 hr/day medical supervision in concert with the patient's rehab needs make it unreasonable for this patient to be served in a less intensive setting? Yes 2. Co-Morbidities requiring supervision/potential complications: DM, afib, diastolic HF,  3. Due to bladder management, bowel management, safety, skin/wound care, disease management, medication administration, pain management and patient education, does the patient require 24 hr/day rehab nursing? Yes 4. Does the patient require coordinated care of a physician, rehab nurse, PT (1-2 hrs/day, 5 days/week) and OT (1-2 hrs/day, 5 days/week) to address physical and functional deficits in the context of the above medical diagnosis(es)? Yes Addressing deficits in the following areas: balance, endurance, locomotion, strength, transferring, bowel/bladder control, bathing, dressing, feeding, grooming, toileting and psychosocial support 5. Can the patient actively participate in an intensive therapy program of at least 3 hrs of therapy per day at least 5 days per week? Yes 6. The potential for patient to make measurable gains while on inpatient rehab is good 7. Anticipated functional outcomes upon discharge from inpatient rehab are supervision with PT, supervision to min assist with OT, n/a with SLP. 8. Estimated rehab length of stay to reach the above functional goals is: 17-14 days 9. Does the patient have adequate social supports to accommodate these discharge functional goals? Yes 10. Anticipated D/C setting: Home 11. Anticipated post D/C treatments: HH therapy 12. Overall Rehab/Functional Prognosis: excellent  RECOMMENDATIONS: This patient's condition is appropriate for continued  rehabilitative care in the following setting: CIR Patient has agreed to participate in recommended program. Yes Note that insurance prior authorization may be required for reimbursement for recommended care.  Comment: Rehab RN to follow up. Pt would benefit from our program's comprehensive team approach as well as the daily physician management of his CV and renal issues. Wife is supportive. Pt had been independent within the home prior to December.  thanks  Ranelle Oyster, MD, [place] [facility]     12/18/2013

## 2013-12-18 NOTE — Progress Notes (Signed)
CIR order in place per Dr. Haroldine Laws.  CSW contacted Danne Baxter, RN Liason for CIR who stated that they would initiate referral. Patient's insurance has changed to [facility] Complete as of 12/14/13 and this may be a barrier to placement in CIR.  CSW met with patient and wife - Jamas Lav this morning and above discussed. They verbalized understanding and if patient cannot go to CIR they want to return to [place].  CSW contacted [facility]- she feels that she will have a bed for patient tomorrow and requested updated clinicals. CSW will sent these to Ms. Teague and follow up in a.m re: d/c plan.  Lorie Phenix. [place], [place]

## 2013-12-18 NOTE — Progress Notes (Signed)
ANTICOAGULATION CONSULT NOTE - Follow-up  Pharmacy Consult for warfarin Indication: atrial fibrillation / new RLE DVT 12/17/13  No Known Allergies  Patient Measurements: Height: 6' (182.9 cm) Weight: 179 lb 0.2 oz (81.2 kg) (bed scale) IBW/kg (Calculated) : 77.6  Vital Signs: Temp: 97.2 F (36.2 C) (01/05 1419) Temp src: Oral (01/05 1419) BP: 115/59 mmHg (01/05 1419) Pulse Rate: 86 (01/05 1419)  Labs:  Recent Labs  12/16/13 0410 12/16/13 0614 12/17/13 0430 12/18/13 0642  HGB 11.5*  --   --  11.6*  HCT 35.8*  --   --  34.8*  PLT 233  --   --  212  LABPROT  --  18.6* 18.2* 18.0*  INR  --  1.60* 1.55* 1.53*  CREATININE 1.47*  --  1.43* 1.30    Estimated Creatinine Clearance: 49.7 ml/min (by C-G formula based on Cr of 1.3).  Assessment: 82 YOM admitted with pna treated with ABX, HFpEF with volume overload diuresed 18L since admit and Afib with CVR on diltiazem.  He was continued on warfarin for afib but developed epistaxis - his nose was packed by ENT and warfarin was held. Epistaxis resolved, INR 1.53 and new RLE DVT found 1/4.  Decision was made to restart anticoagulation and monitior closely.    Warfarin PTA 2.5mg  daily - previously on amio - stopped day of last d/c 12/05/13  Goal of Therapy:  INR 2-3 Monitor platelets by anticoagulation protocol: Yes  Plan:   Restart warfarin 5mg  x1 tonight Start Lovenox 1mg /kg = 80mg  q12hr until INR > 2 and 5 days overlap for VTE Daily Protime Monitor s/s bleeding  Leota SauersLisa Jenet Durio Pharm.D. CPP, BCPS Clinical Pharmacist [phone] 12/18/2013 4:16 PM

## 2013-12-19 DIAGNOSIS — I82409 Acute embolism and thrombosis of unspecified deep veins of unspecified lower extremity: Secondary | ICD-10-CM

## 2013-12-19 LAB — BASIC METABOLIC PANEL
BUN: 38 mg/dL — ABNORMAL HIGH (ref 6–23)
CO2: 32 mEq/L (ref 19–32)
Calcium: 9.9 mg/dL (ref 8.4–10.5)
Chloride: 90 mEq/L — ABNORMAL LOW (ref 96–112)
Creatinine, Ser: 1.22 mg/dL (ref 0.50–1.35)
GFR calc non Af Amer: 54 mL/min — ABNORMAL LOW (ref >90)
GFR, EST AFRICAN AMERICAN: 63 mL/min — AB (ref >90)
Glucose, Bld: 239 mg/dL — ABNORMAL HIGH (ref 70–99)
POTASSIUM: 4.3 meq/L (ref 3.7–5.3)
Sodium: 133 mEq/L — ABNORMAL LOW (ref 137–147)

## 2013-12-19 LAB — CBC
HEMATOCRIT: 37.7 % — AB (ref 39.0–52.0)
HEMOGLOBIN: 12.4 g/dL — AB (ref 13.0–17.0)
MCH: 30.2 pg (ref 26.0–34.0)
MCHC: 32.9 g/dL (ref 30.0–36.0)
MCV: 91.7 fL (ref 78.0–100.0)
Platelets: 202 10*3/uL (ref 150–400)
RBC: 4.11 MIL/uL — ABNORMAL LOW (ref 4.22–5.81)
RDW: 14.3 % (ref 11.5–15.5)
WBC: 12.2 10*3/uL — AB (ref 4.0–10.5)

## 2013-12-19 LAB — GLUCOSE, CAPILLARY
GLUCOSE-CAPILLARY: 265 mg/dL — AB (ref 70–99)
GLUCOSE-CAPILLARY: 352 mg/dL — AB (ref 70–99)
Glucose-Capillary: 205 mg/dL — ABNORMAL HIGH (ref 70–99)
Glucose-Capillary: 266 mg/dL — ABNORMAL HIGH (ref 70–99)
Glucose-Capillary: 240 mg/dL — ABNORMAL HIGH (ref 70–99)

## 2013-12-19 LAB — PROTIME-INR
INR: 1.31 (ref 0.00–1.49)
PROTHROMBIN TIME: 16 s — AB (ref 11.6–15.2)

## 2013-12-19 MED ORDER — LEVOFLOXACIN 750 MG PO TABS
750.0000 mg | ORAL_TABLET | ORAL | Status: DC
  Administered 2013-12-20: 750 mg via ORAL
  Filled 2013-12-19 (×2): qty 1

## 2013-12-19 MED ORDER — WARFARIN SODIUM 5 MG PO TABS
5.0000 mg | ORAL_TABLET | Freq: Once | ORAL | Status: AC
  Administered 2013-12-19: 5 mg via ORAL
  Filled 2013-12-19: qty 1

## 2013-12-19 NOTE — Progress Notes (Signed)
PROGRESS NOTE  [name] [name] ZOX:[id number] DOB: 20-Mar-1931 DOA: 12/10/2013 PCP: No PCP Per Patient   HPI 80 y.o. male with history of CAD, PVD, atrial fibrillation on Coumadin, hypertension, DM 2, former smoker, CVA with residual left hemiparesis, was recently hospitalized at [facility] between 11/23/2013-12/05/2013 for ischemic right leg. During that admission, he underwent a right femoral embolectomy by vascular surgery. Cardiology had consulted for A. Fib & acute on chronic diastolic CHF. Foley catheter was placed for urinary retention. He was discharged to [facility] in [place]. He presented to the [facility] ED today with complaints of worsening dyspnea of 2 days' duration, worsening generalized body swelling, no chest pain or cough and left foot pain. He also complains of orthopnea and PND. No fevers or chills. Left foot pain is intermittent and mostly to touch and spouse has noticed slight redness on the dorsal lateral aspect. Spouse also states that patient has been running low blood sugars for the last 2 nights. In the ED, he was found to be hypoglycemic, in acute respiratory failure from fluid overload, acute renal failure and possible UTI. He was transferred to [facility] step unit for further evaluation and management. Hospitalist consultation requested.  Assessment/Plan: Acute respiratory failure with hypoxia  - multifactorial likely due to COPD exacerbation, acute diastolic CHF exacerbation, HCAP  - management per Cardiology, improving  - Slow steroid taper, solu-medrol discontinued now on 10 BID.   - cont nebs and Robittusin DM and flutter valve  - chronically on 2 L of O2 at home  - started on Vanc and Zosyn on 12/28 by PCCM; de-escalate to Levofloxacin 1/2  - repeat CXR 1/4 am with pneumonia. Continue antibiotics.  Epistaxis - noted events 1/2 overnight, appreciate ENT consult.  - patient with intermittent nose bleeds, now resolved. Continue  anticoagulation, benefits >> risks  Recent DVT RLE and arterial embolus s/p R femoral embolectomy and angioplasty December 2014 - diagnosed at the beginning of last December; on Coumadin, however with recent Epistaxis.  - DVT US positive, continue anticoagulation Atrial fibrillation with RVR  - currently controlled on Amiodarone. - management per Cardiology  AKI on CKD 3 - cont to follow with diuresis - crt stable, 1.5>1.62>1.54>1.47>1.43 Recent embolic event right LE - s/p embolectomy  Diabetes mellitus - non-insulin requiring - CBG elevated due to steroids  - sliding scale  - taper steroids, 10 BID Hyperlipidemia  BPH - cont Hytrin  H/O CVA  Diet: carb Fluids: none DVT Prophylaxis: on Coumadin  Code Status: Full Family Communication: none  Disposition Plan: CIR when bed available.   Consultants:  Cardiology  PCCM  ENT  Procedures:  none   Antibiotics 12/28 vanc >> 1/2 12/28 zosyn >> 1/2 1/2 Levofloxacin >   HPI/Subjective: - feeling better  Objective: Filed Vitals:   12/19/13 0630 12/19/13 0826 12/19/13 1004 12/19/13 1351  BP: 116/61  107/38 119/61  Pulse: 94  89 72  Temp: 98 F (36.7 C)   97.5 F (36.4 C)  TempSrc: Oral   Axillary  Resp: 18   18  Height:      Weight: 80.377 kg (177 lb 3.2 oz)     SpO2: 62% 96%  97%    Intake/Output Summary (Last 24 hours) at 12/19/13 1605 Last data filed at 12/19/13 1300  Gross per 24 hour  Intake    960 ml  Output   3176 ml  Net  -2216 ml   Filed Weights   12/17/13 0449 12/18/13 0520  12/19/13 0630  Weight: 86.637 kg (191 lb) 81.2 kg (179 lb 0.2 oz) 80.377 kg (177 lb 3.2 oz)   Exam:  General:  NAD  Cardiovascular: irregular, no JVD  Respiratory: good air movement, clear to auscultation throughout, no wheezing, ronchi or rales  Abdomen: soft, not tender to palpation, positive bowel sounds  MSK: 1+ peripheral edema  Neuro: CN 2-12 grossly intact, MS 5/5 in all 4  Data Reviewed: Basic Metabolic  Panel:  Recent Labs Lab 12/15/13 0543 12/16/13 0410 12/17/13 0430 12/18/13 0642 12/19/13 0415  NA 142 138 139 136* 133*  K 4.1 4.5 4.6 4.5 4.3  CL 96 96 95* 91* 90*  CO2 31 26 33* 29 32  GLUCOSE 254* 268* 248* 234* 239*  BUN 40* 46* 42* 36* 38*  CREATININE 1.54* 1.47* 1.43* 1.30 1.22  CALCIUM 8.6 8.8 9.1 9.5 9.9   Liver Function Tests: No results found for this basename: AST, ALT, ALKPHOS, BILITOT, PROT, ALBUMIN,  in the last 168 hours CBC:  Recent Labs Lab 12/14/13 0530 12/16/13 0410 12/18/13 0642 12/19/13 0415  WBC 7.3 12.2* 12.1* 12.2*  HGB 11.0* 11.5* 11.6* 12.4*  HCT 33.9* 35.8* 34.8* 37.7*  MCV 93.6 93.7 91.3 91.7  PLT 220 233 212 202   Cardiac Enzymes: No results found for this basename: CKTOTAL, CKMB, CKMBINDEX, TROPONINI,  in the last 168 hours BNP (last 3 results)  Recent Labs  12/10/13 1630  PROBNP 5532.0*   CBG:  Recent Labs Lab 12/18/13 1053 12/18/13 1630 12/18/13 2149 12/19/13 0654 12/19/13 1110  GLUCAP 176* 253* 265* 205* 266*    Recent Results (from the past 240 hour(s))  MRSA PCR SCREENING     Status: None   Collection Time    12/10/13 12:09 PM      Result Value Range Status   MRSA by PCR NEGATIVE  NEGATIVE Final   Comment:            The GeneXpert MRSA Assay (FDA     approved for NASAL specimens     only), is one component of a     comprehensive MRSA colonization     surveillance program. It is not     intended to diagnose MRSA     infection nor to guide or     monitor treatment for     MRSA infections.  URINE CULTURE     Status: None   Collection Time    12/10/13  6:02 PM      Result Value Range Status   Specimen Description URINE, CATHETERIZED   Final   Special Requests NONE   Final   Culture  Setup Time     Final   Value: 12/11/2013 01:10     Performed at [facility]   Colony Count     Final   Value: 50,000 COLONIES/ML     Performed at [facility]   Culture     Final   Value: ESCHERICHIA COLI      Performed at [facility]   Report Status 12/13/2013 FINAL   Final   Organism ID, Bacteria ESCHERICHIA COLI   Final   Studies: No results found.  Scheduled Meds: . antiseptic oral rinse  15 mL Mouth Rinse q12n4p  . atorvastatin  10 mg Oral Daily  . diltiazem  240 mg Oral Daily  . enoxaparin (LOVENOX) injection  1 mg/kg Subcutaneous Q12H  . famotidine  20 mg Oral QHS  . furosemide  80 mg Oral BID  .  guaiFENesin  600 mg Oral BID  . insulin aspart  0-9 Units Subcutaneous TID WC  . levalbuterol  0.63 mg Nebulization TID  . levofloxacin  750 mg Oral Q24H  . oxymetazoline  1 spray Each Nare BID  . potassium chloride  40 mEq Oral Daily  . predniSONE  10 mg Oral BID WC  . sodium chloride  4 spray Each Nare 6 X Daily  . warfarin  5 mg Oral ONCE-1800  . Warfarin - Pharmacist Dosing Inpatient   Does not apply q1800   Continuous Infusions:   Principal Problem:   Acute respiratory failure with hypoxia Active Problems:   Atrial fibrillation   H/O: CVA (cerebrovascular accident)   COPD (chronic obstructive pulmonary disease)   Anasarca   Acute on chronic diastolic heart failure   Acute renal failure   Anemia   UTI (urinary tract infection)   Diabetes mellitus with hypoglycemia   Acute on chronic diastolic CHF (congestive heart failure)   Peripheral vascular disease   Acute on chronic diastolic congestive heart failure   DVT (deep venous thrombosis)  Time spent: 25  Pamella Pert, MD [facility] Pager [phone]. If 7 PM - 7 AM, please contact night-coverage at [email or web address], password [facility] 12/19/2013, 4:05 PM  LOS: 9 days

## 2013-12-19 NOTE — Progress Notes (Signed)
I await insurance decision for admit to inpt rehab. [phone]

## 2013-12-19 NOTE — Progress Notes (Deleted)
ANTICOAGULATION CONSULT NOTE - Follow-up  Pharmacy Consult for warfarin Indication: atrial fibrillation / new RLE DVT 12/17/13  No Known Allergies  Patient Measurements: Height: 6' (182.9 cm) Weight: 177 lb 3.2 oz (80.377 kg) (bed scale) IBW/kg (Calculated) : 77.6  Vital Signs: Temp: 97.5 F (36.4 C) (01/06 1351) Temp src: Axillary (01/06 1351) BP: 119/61 mmHg (01/06 1351) Pulse Rate: 72 (01/06 1351)  Labs:  Recent Labs  12/17/13 0430 12/18/13 0642 12/19/13 0415  HGB  --  11.6* 12.4*  HCT  --  34.8* 37.7*  PLT  --  212 202  LABPROT 18.2* 18.0* 16.0*  INR 1.55* 1.53* 1.31  CREATININE 1.43* 1.30 1.22    Estimated Creatinine Clearance: 53 ml/min (by C-G formula based on Cr of 1.22).  Assessment: 82 YOM admitted with PNA treated with ABX, HFpEF with volume overload and Afib with CVR on diltiazem.  He was continued on warfarin for afib but developed epistaxis - his nose was packed by ENT and warfarin was held. Epistaxis resolved, INR 1.53 and new RLE DVT found 1/4.  Decision was made to restart anticoagulation and monitior closely.    Warfarin PTA 2.5mg  daily - previously on amio - stopped day of last d/c 12/05/13  1/6 INR remains subtherapeutic at 1.31.  No epistaxis noted today.  Goal of Therapy:  INR 2-3 Monitor platelets by anticoagulation protocol: Yes  Plan:  Repeat warfarin 5mg  x1 tonight Continue Lovenox 1mg /kg = 80mg  q12hr until INR > 2 and 5 days overlap for VTE Daily Protime Monitor s/s bleeding  Tad MooreJessica Brittanya Winburn, Pharm D, BCPS  Clinical Pharmacist Pager [phone]  12/19/2013 2:01 PM

## 2013-12-19 NOTE — H&P (Signed)
Physical Medicine and Rehabilitation Admission H&P    CC: Deconditioning due to multiple medical issues.   HPI: [name] [name] is a 80 y.o. male with history of COPD--O2 at nights, h/o CVA with L-HP and memory deficits, chronic CHF, A fib with RVR as well as recent admission for ischemic RLE requiring R- femoral embolectomy due to embolic event. [name] on 12/10/13 with worsening of dyspnea, anasarca, acute renal failure and left foot pain. He was treated for acute on chronic renal failure with IV diuresis and started on antibiotics for AECOPD v/s viral bronchitis. Required BIPAP due to mucous plugging. Treated with nebs and steroids and respiratory status improving. He developed acute epistaxis on 01/02/124 treated with chemical cautery and nasal packing by Dr. Wilburn Cornelia with recommendations for continued antibiotic coverage.  Follow up doppler with thrombus right posterior tibial vein and no DVT LLE. Coumadin resumed 12/18/13 --he has had intermittent bleeding early am today. Nasal packing remains in place.   Patient noted to be deconditioned and CIR recommended by therapy team. Patient admitted today.   Review of Systems  HENT: Negative for hearing loss.   Respiratory: Positive for cough. Negative for shortness of breath and wheezing.   Cardiovascular: Negative for chest pain and palpitations.  Gastrointestinal: Negative for heartburn.  Genitourinary: Positive for urgency and frequency.  Musculoskeletal: Negative for back pain.  Neurological: Positive for focal weakness. Negative for headaches.  Psychiatric/Behavioral: Positive for memory loss. The patient does not have insomnia.      Past Medical History  Diagnosis Date  . Hypertension   . Stroke   . COPD (chronic obstructive pulmonary disease)   . Diabetes mellitus without complication   . GERD (gastroesophageal reflux disease)   . A-fib     Past Surgical History  Procedure Laterality Date  . Carotid stent    . Parotid  gland tumor excision    . Embolectomy Right 11/23/2013    Procedure: Right Femoral EMBOLECTOMY;  Surgeon: Angelia Mould, MD;  Location: [facility] OR;  Service: Vascular;  Laterality: Right;  Right Femoral Embolectomy with Bovine Paricardium patch angioplasty.    History reviewed. No pertinent family history.   Social History: Married. Used to work in a [employer]. Independent prior to December admission. He reports that he has quit smoking 17 years ago. He does not have any smokeless tobacco history on file. He reports that he does not drink alcohol or use illicit drugs.   Allergies: No Known Allergies   Medications Prior to Admission  Medication Sig Dispense Refill  . atorvastatin (LIPITOR) 10 MG tablet Take 10 mg by mouth daily.      . benazepril (LOTENSIN) 10 MG tablet Take 10 mg by mouth 2 (two) times daily.      . furosemide (LASIX) 40 MG tablet Take 40-60 mg by mouth 2 (two) times daily. Takes 58m in morning and 474min evening      . glipiZIDE (GLUCOTROL XL) 10 MG 24 hr tablet Take 10 mg by mouth 2 (two) times daily.      . Marland Kitchenevalbuterol (XOPENEX) 0.63 MG/3ML nebulizer solution Take 3 mLs (0.63 mg total) by nebulization every 6 (six) hours as needed for wheezing or shortness of breath.      . metoprolol (LOPRESSOR) 50 MG tablet Take 50 mg by mouth 2 (two) times daily.      . potassium chloride SA (K-DUR,KLOR-CON) 20 MEQ tablet Take 20 mEq by mouth daily.      . Marland Kitchenerazosin (HYTRIN) 5  MG capsule Take 5 mg by mouth every other day.        Home: Home Living Family/patient expects to be discharged to:: Skilled nursing facility Living Arrangements: Spouse/significant other   Functional History: Prior Function Comments: Pt reports that wife provided assist for BADLs for some time.  He has not been ambulatory since prior to previous admission.  Prior to discharge to SNF 12/23, he was pivoting to chair with max A, and he reports that SNF staff used a lift to transfer him to w/c only    Functional Status:  Mobility: Bed Mobility Bed Mobility: Left Sidelying to Sit;Rolling Left;Sitting - Scoot to Edge of Bed Rolling Left: 4: Min assist;With rail Left Sidelying to Sit: 3: Mod assist;HOB flat;With rails Supine to Sit: 3: Mod assist;HOB elevated;With rails Sitting - Scoot to Edge of Bed: 5: Supervision Sitting - Scoot to Edge of Bed: Patient Percentage: 40% Sit to Supine: 2: Max assist;HOB flat Scooting to HOB: 1: +1 Total assist Transfers Transfers: Stand Pivot Transfers;Sit to Stand;Stand to Sit Sit to Stand: From bed;From chair/3-in-1;3: Mod assist;With armrests Sit to Stand: Patient Percentage: 70% Stand to Sit: 4: Min assist Stand to Sit: Patient Percentage: 30% Stand Pivot Transfers: 1: +2 Total assist Stand Pivot Transfers: Patient Percentage: 40% Ambulation/Gait Ambulation/Gait Assistance: 3: Mod assist (+1 to follow with chair) Ambulation Distance (Feet): 10 Feet Assistive device: Rolling walker Ambulation/Gait Assistance Details: assist to direct RW with cueing for posture and position in RW, pt with partial knee buckling left and foot rolling at times due to lack of [name] with need for chair to be following closely to prevent fall. Attempted gait a second time but pt unable to take steps due to instability of Left knee with fatigue Gait Pattern: Step-through pattern;Shuffle;Decreased stride length;Trunk flexed Gait velocity: decreased Stairs: No    ADL: ADL Eating/Feeding: Minimal assistance Where Assessed - Eating/Feeding: Bed level Grooming: Wash/dry face;Wash/dry hands;Minimal assistance Where Assessed - Grooming: Supported sitting Upper Body Bathing: +1 Total assistance Where Assessed - Upper Body Bathing: Supported sitting Lower Body Bathing: +1 Total assistance Where Assessed - Lower Body Bathing: Supine, head of bed up;Rolling right and/or left Upper Body Dressing: +1 Total assistance Where Assessed - Upper Body Dressing: Supported  sitting Lower Body Dressing: +1 Total assistance Where Assessed - Lower Body Dressing: Supine, head of bed up;Supine, head of bed flat;Rolling right and/or left Toilet Transfer: +1 Total assistance (unable ) Transfers/Ambulation Related to ADLs: Pt unable to move sit to stand with total A +1 ADL Comments: Pt limited by dyspnea and fatigue as well as weakness  Cognition: Cognition Overall Cognitive Status: Within Functional Limits for tasks assessed Orientation Level: Oriented X4 Cognition Arousal/Alertness: Awake/alert Behavior During Therapy: WFL for tasks assessed/performed Overall Cognitive Status: Within Functional Limits for tasks assessed Memory: Decreased short-term memory  Physical Exam: Blood pressure 128/54, pulse 76, temperature 97.9 F (36.6 C), temperature source Oral, resp. rate 20, height 6' (1.829 m), weight 79.6 kg (175 lb 7.8 oz), SpO2 96.00%. Physical Exam  Nursing note and vitals reviewed. Constitutional: He is oriented to person, place, and time. He appears well-developed and well-nourished.     HENT:  Head: Normocephalic and atraumatic.  Packing in right nares with contracted edges and fresh blood at edges, in no distress. Edentulous  Eyes: Conjunctivae are normal.  Dysconjugate gaze with left exophthalmus.   Neck: Normal range of motion. Neck supple. No JVD present. No tracheal deviation present. No thyromegaly present.  Cardiovascular: Normal rate.  SEM   Respiratory: Effort normal and breath sounds normal. No respiratory distress. He has no wheezes.  Rhonchi, few other upper airway sounds  GI: Soft. Bowel sounds are normal. He exhibits no distension. There is no tenderness. There is no rebound.  Musculoskeletal: He exhibits no edema.  Lymphadenopathy:    He has no cervical adenopathy.  Neurological: He is alert and oriented to person, place, and time.  Reasonable insight and awareness. Attention good. Slightly HOH. LUE is 3- delt, 3 to 3+ biceps,  tricep and 3+ HI.  LLE is 2+/5 HF, 3- KE, ADF and APF were 1+ to 2.  Sensation appears slightly reduced to PP and LT throughout the left. RUE is 4 to 4+/5. RLE is 3 to 3+ HF to 4/5 distally. Mild left central 7 seen. Speech slightly dysarthric.    Skin: Skin is warm. He is diaphoretic.  Numerous ecchymoses. A few lacs/dressed  Psychiatric: He has a normal mood and affect. His behavior is normal. Thought content normal.    Results for orders placed during the hospital encounter of 12/10/13 (from the past 48 hour(s))  GLUCOSE, CAPILLARY     Status: Abnormal   Collection Time    12/18/13 10:53 AM      Result Value Range   Glucose-Capillary 176 (*) 70 - 99 mg/dL   Comment 1 Notify RN    GLUCOSE, CAPILLARY     Status: Abnormal   Collection Time    12/18/13  4:30 PM      Result Value Range   Glucose-Capillary 253 (*) 70 - 99 mg/dL   Comment 1 Notify RN    GLUCOSE, CAPILLARY     Status: Abnormal   Collection Time    12/18/13  9:49 PM      Result Value Range   Glucose-Capillary 265 (*) 70 - 99 mg/dL   Comment 1 Notify RN     Comment 2 Documented in Chart    PROTIME-INR     Status: Abnormal   Collection Time    12/19/13  4:15 AM      Result Value Range   Prothrombin Time 16.0 (*) 11.6 - 15.2 seconds   INR 1.31  0.00 - 3.29  BASIC METABOLIC PANEL     Status: Abnormal   Collection Time    12/19/13  4:15 AM      Result Value Range   Sodium 133 (*) 137 - 147 mEq/L   Potassium 4.3  3.7 - 5.3 mEq/L   Chloride 90 (*) 96 - 112 mEq/L   CO2 32  19 - 32 mEq/L   Glucose, Bld 239 (*) 70 - 99 mg/dL   BUN 38 (*) 6 - 23 mg/dL   Creatinine, Ser 1.22  0.50 - 1.35 mg/dL   Calcium 9.9  8.4 - 10.5 mg/dL   GFR calc non Af Amer 54 (*) >90 mL/min   GFR calc Af Amer 63 (*) >90 mL/min   Comment: (NOTE)     The eGFR has been calculated using the CKD EPI equation.     This calculation has not been validated in all clinical situations.     eGFR's persistently <90 mL/min signify possible Chronic Kidney      Disease.  CBC     Status: Abnormal   Collection Time    12/19/13  4:15 AM      Result Value Range   WBC 12.2 (*) 4.0 - 10.5 K/uL   RBC 4.11 (*) 4.22 - 5.81 MIL/uL  Hemoglobin 12.4 (*) 13.0 - 17.0 g/dL   HCT 37.7 (*) 39.0 - 52.0 %   MCV 91.7  78.0 - 100.0 fL   MCH 30.2  26.0 - 34.0 pg   MCHC 32.9  30.0 - 36.0 g/dL   RDW 14.3  11.5 - 15.5 %   Platelets 202  150 - 400 K/uL  GLUCOSE, CAPILLARY     Status: Abnormal   Collection Time    12/19/13  6:54 AM      Result Value Range   Glucose-Capillary 205 (*) 70 - 99 mg/dL  GLUCOSE, CAPILLARY     Status: Abnormal   Collection Time    12/19/13 11:10 AM      Result Value Range   Glucose-Capillary 266 (*) 70 - 99 mg/dL   Comment 1 Documented in Chart    GLUCOSE, CAPILLARY     Status: Abnormal   Collection Time    12/19/13  4:31 PM      Result Value Range   Glucose-Capillary 240 (*) 70 - 99 mg/dL   Comment 1 Notify RN    GLUCOSE, CAPILLARY     Status: Abnormal   Collection Time    12/19/13  9:31 PM      Result Value Range   Glucose-Capillary 352 (*) 70 - 99 mg/dL   Comment 1 Notify RN    PROTIME-INR     Status: Abnormal   Collection Time    12/20/13  3:15 AM      Result Value Range   Prothrombin Time 16.1 (*) 11.6 - 15.2 seconds   INR 1.32  0.00 - 3.81  BASIC METABOLIC PANEL     Status: Abnormal   Collection Time    12/20/13  3:15 AM      Result Value Range   Sodium 132 (*) 137 - 147 mEq/L   Potassium 4.5  3.7 - 5.3 mEq/L   Chloride 88 (*) 96 - 112 mEq/L   CO2 28  19 - 32 mEq/L   Glucose, Bld 306 (*) 70 - 99 mg/dL   BUN 40 (*) 6 - 23 mg/dL   Creatinine, Ser 1.30  0.50 - 1.35 mg/dL   Calcium 10.3  8.4 - 10.5 mg/dL   GFR calc non Af Amer 50 (*) >90 mL/min   GFR calc Af Amer 58 (*) >90 mL/min   Comment: (NOTE)     The eGFR has been calculated using the CKD EPI equation.     This calculation has not been validated in all clinical situations.     eGFR's persistently <90 mL/min signify possible Chronic Kidney     Disease.   GLUCOSE, CAPILLARY     Status: Abnormal   Collection Time    12/20/13  6:08 AM      Result Value Range   Glucose-Capillary 241 (*) 70 - 99 mg/dL   Comment 1 Notify RN     No results found.  Post Admission Physician Evaluation: 1. Functional deficits secondary  to deconditioning after acute renal failure, respiratory failure in the setting of premorbid left-sided weakness from prior CVA . 2. Patient is admitted to receive collaborative, interdisciplinary care between the physiatrist, rehab nursing staff, and therapy team. 3. Patient's level of medical complexity and substantial therapy needs in context of that medical necessity cannot be provided at a lesser intensity of care such as a SNF. 4. Patient has experienced substantial functional loss from his/her baseline which was documented above under the "Functional History" and "Functional Status" headings.  Judging by the patient's diagnosis, physical exam, and functional history, the patient has potential for functional progress which will result in measurable gains while on inpatient rehab.  These gains will be of substantial and practical use upon discharge  in facilitating mobility and self-care at the household level. 5. Physiatrist will provide 24 hour management of medical needs as well as oversight of the therapy plan/treatment and provide guidance as appropriate regarding the interaction of the two. 6. 24 hour rehab nursing will assist with bladder management, bowel management, safety, skin/wound care, disease management, medication administration, pain management and patient education  and help integrate therapy concepts, techniques,education, etc. 7. PT will assess and treat for/with: Lower extremity strength, range of motion, stamina, balance, functional mobility, safety, adaptive techniques and equipment, orthotic assessment, family education, pain mgt, cardio-pulmonary stamina.   Goals are: supervision. 8. OT will assess and treat  for/with: ADL's, functional mobility, safety, upper extremity strength, adaptive techniques and equipment, NMR, cardiopulmonary stamina, family ed.   Goals are: supervision to min assist. 9. SLP will assess and treat for/with: n/a.  Goals are: n/a. 10. Case Management and Social Worker will assess and treat for psychological issues and discharge planning. 11. Team conference will be held weekly to assess progress toward goals and to determine barriers to discharge. 12. Patient will receive at least 3 hours of therapy per day at least 5 days per week. 13. ELOS: 15-20 days       14. Prognosis:  excellent   Medical Problem List and Plan: Deconditioning related acute renal failure, hypoxia, and multiple medical issues  1. RLE DVT/A fib/Anticoagulation: Pharmaceutical: Coumadin and Lovenox 2. Pain Management: prn tylenol  3. Mood:  Will have LCSW follow for evaluation.  4. Neuropsych: This patient is capable of making decisions on his own behalf. 5. Epistaxis: Will monitor for now--may have to discuss risks v/s benefits if bleeding worsens/recurs as INR rises to therapeutic levels.  Nasal packing remains in place.  Antibiotic coverage while packing in place.  6. Question multilobar PNA:  Will check follow CXR. Antibiotic D# 11. Has persistent cough 7. AECOPD:  Respiratory status improving and slow taper of steroids.   Continue nebs and IS.  8.  Acute on chronic CHF: Compensated--continue low salt diet with 1500 cc fluid restriction. On lasix 80 mg bid. Check daily weights. Daily monitoring of I's and O's.  9.  A fib: monitor HR with bid checks. Continue diltiazem 240 mg daily.  10.  GU: Foley d/c yesterday. Has frequency and urgency due to diuretics. . Toilet patient as unable to use urinal and needs to stand to empty.  Condom cath at nights. Will check PVRs   Meredith Staggers, MD, [facility]   12/20/2013

## 2013-12-19 NOTE — Progress Notes (Signed)
Physical Therapy Treatment Patient Details Name: [name] [name] MRN: [id number] DOB: [birth year] Today's Date: 12/19/2013 Time: 4098-1191 PT Time Calculation (min): 24 min  PT Assessment / Plan / Recommendation  History of Present Illness 80 y/o male with diastolic CHF, HTN COPD, NIRDM with A-fib with recent embolic event to right leg s/p right femoral embolectomy.  He was discharged to SNF on 12/05/12.  He was readmitted on 12/28 with acute respiratory failure suspected to be due to a COPD exacerbation and decompensated CHF and admitted by the hospitalist service and transferred to the ICU on 12/29 early AM for respiratory distress possibly due to mucous plugging. Required BiPAP   PT Comments   Pt with excellent progression today able to ambulate even without [name] present. Pt very motivated and in good spirits recalling therapists from 56yrs ago. Pt encouraged to continue mobility with nursing acutely and if remains in hospital to have wife return AFOs. Pt stable on RA throughout.  Follow Up Recommendations  CIR;SNF (pending insurance approval)     Does the patient have the potential to tolerate intense rehabilitation     Barriers to Discharge        Equipment Recommendations       Recommendations for Other Services    Frequency     Progress towards PT Goals Progress towards PT goals: Progressing toward goals  Plan Discharge plan needs to be updated    Precautions / Restrictions Precautions Precautions: Fall Other Brace/Splint: pt normally uses Lt double-upright [name] (currently at home-pt's spouse brought it then returned it home)   Pertinent Vitals/Pain No pain VSS    Mobility  Bed Mobility Rolling Left: 4: Min assist;With rail Left Sidelying to Sit: 3: Mod assist;HOB flat;With rails Sitting - Scoot to Edge of Bed: 5: Supervision Details for Bed Mobility Assistance: cues for sequence with assist to bring bil LE fully off EOB and clear trunk from surface Transfers Sit to Stand:  From bed;From chair/3-in-1;3: Mod assist;With armrests Sit to Stand: Patient Percentage: 70% Stand to Sit: 4: Min assist Details for Transfer Assistance: cueing for sequence, hand placement and safety. from bed x 2 and chair x 1, pt prefers to count to 3 before transfers Ambulation/Gait Ambulation/Gait Assistance: 3: Mod assist (+1 to follow with chair) Ambulation Distance (Feet): 10 Feet Assistive device: Rolling walker Ambulation/Gait Assistance Details: assist to direct RW with cueing for posture and position in RW, pt with partial knee buckling left and foot rolling at times due to lack of [name] with need for chair to be following closely to prevent fall. Attempted gait a second time but pt unable to take steps due to instability of Left knee with fatigue Gait Pattern: Step-through pattern;Shuffle;Decreased stride length;Trunk flexed Gait velocity: decreased Stairs: No    Exercises General Exercises - Lower Extremity Short Arc Quad: AAROM;Left;20 reps;Seated Long Arc Quad: AROM;Right;20 reps;Seated Hip ABduction/ADduction: AROM;AAROM;Seated;Right;Left;20 reps (AAROM on LLE) Hip Flexion/Marching: AROM;AAROM;Right;Left;20 reps;Seated (AAROM on LLE) Toe Raises: AROM;Seated;Right;20 reps Heel Raises: AROM;Seated;Right;20 reps   PT Diagnosis:    PT Problem List:   PT Treatment Interventions:     PT Goals (current goals can now be found in the care plan section)    Visit Information  Last PT Received On: 12/19/13 Assistance Needed: +2 (safety with gait) History of Present Illness: 80 y/o male with diastolic CHF, HTN COPD, NIRDM with A-fib with recent embolic event to right leg s/p right femoral embolectomy.  He was discharged to SNF on 12/05/12.  He was readmitted on 12/28  with acute respiratory failure suspected to be due to a COPD exacerbation and decompensated CHF and admitted by the hospitalist service and transferred to the ICU on 12/29 early AM for respiratory distress possibly due  to mucous plugging. Required BiPAP    Subjective Data      Cognition  Cognition Arousal/Alertness: Awake/alert Behavior During Therapy: WFL for tasks assessed/performed Overall Cognitive Status: Within Functional Limits for tasks assessed    Balance     End of Session PT - End of Session Equipment Utilized During Treatment: Gait belt Activity Tolerance: Patient tolerated treatment well Patient left: in chair;with call bell/phone within reach Nurse Communication: Mobility status   GP     Delorse Lekabor, Jafeth Mustin Beth 12/19/2013, 10:59 AM Delaney MeigsMaija Tabor Peniel Biel, PT [phone]

## 2013-12-19 NOTE — Progress Notes (Signed)
Report given to receiving RN. Patient is stable with no complaints and no signs or symptoms of distress. 

## 2013-12-19 NOTE — Progress Notes (Addendum)
ANTICOAGULATION/Antibiotic CONSULT NOTE - Follow-up  Pharmacy Consult for warfarin, Levaquin Indication: atrial fibrillation / new RLE DVT 12/17/13; levaquin for PNA  No Known Allergies  Patient Measurements: Height: 6' (182.9 cm) Weight: 177 lb 3.2 oz (80.377 kg) (bed scale) IBW/kg (Calculated) : 77.6  Vital Signs: Temp: 97.5 F (36.4 C) (01/06 1351) Temp src: Axillary (01/06 1351) BP: 119/61 mmHg (01/06 1351) Pulse Rate: 72 (01/06 1351)  Labs:  Recent Labs  12/17/13 0430 12/18/13 0642 12/19/13 0415  HGB  --  11.6* 12.4*  HCT  --  34.8* 37.7*  PLT  --  212 202  LABPROT 18.2* 18.0* 16.0*  INR 1.55* 1.53* 1.31  CREATININE 1.43* 1.30 1.22    Estimated Creatinine Clearance: 53 ml/min (by C-G formula based on Cr of 1.22).  Assessment: 82 YOM admitted with PNA treated with ABX, HFpEF with volume overload and Afib with CVR on diltiazem.  He was continued on warfarin for afib but developed epistaxis - his nose was packed by ENT and warfarin was held. Epistaxis resolved, INR 1.53 and new RLE DVT found 1/4.  Decision was made to restart anticoagulation and monitior closely.    Warfarin PTA 2.5mg  daily - previously on amio - stopped day of last d/c 12/05/13  1/6 INR remains subtherapeutic at 1.31.  No epistaxis noted today.  Also on levaquin day 8 for HCAP.  Scr slightly improved today.  Goal of Therapy:  INR 2-3 Monitor platelets by anticoagulation protocol: Yes  Plan:  Repeat warfarin 5mg  x1 tonight Continue Lovenox 1mg /kg = 80mg  q12hr until INR > 2 and 5 days overlap for VTE Daily Protime Monitor s/s bleeding Based on today's CrCl he could increase Levaquin to q 24 hrs, but he's scheduled to receive a dose today already, so will watch Scr another day before adjusting dose.  Consider d/c Levaquin soon?  Tad MooreJessica Meygan Kyser, Pharm D, BCPS  Clinical Pharmacist Pager [phone]  12/19/2013 2:04 PM

## 2013-12-19 NOTE — Progress Notes (Signed)
Discontinued foley catheter. Patient tolerated it well. 

## 2013-12-19 NOTE — Progress Notes (Signed)
Patient ID: [name] GoodyBobby [name], male   DOB: 10-27-1931, 80 y.o.   MRN: [id number]   SUBJECTIVE:   80 y/o male admitted with a/c respiratory failure in the setting of acute bronchitis and a/c diastolic HF. HR controlled in 80s (atrial fibrillation). Stable overnight. 24 hr I/O -3.1 liters and weight down 2 lbs (total 40 lbs). He got OOB yesterday and reports walking some with brace on. No longer having epistaxis currently, however INR not therapeutic. Wanted to try NOAC, however pt could not afford medication. Denies SOB, orthopnea or CP. Back on coumadin.     Marland Kitchen. antiseptic oral rinse  15 mL Mouth Rinse q12n4p  . atorvastatin  10 mg Oral Daily  . diltiazem  240 mg Oral Daily  . enoxaparin (LOVENOX) injection  1 mg/kg Subcutaneous Q12H  . famotidine  20 mg Oral QHS  . furosemide  80 mg Oral BID  . guaiFENesin  600 mg Oral BID  . insulin aspart  0-9 Units Subcutaneous TID WC  . levalbuterol  0.63 mg Nebulization TID  . levofloxacin  750 mg Oral Q48H  . oxymetazoline  1 spray Each Nare BID  . potassium chloride  40 mEq Oral Daily  . predniSONE  10 mg Oral BID WC  . sodium chloride  4 spray Each Nare 6 X Daily  . Warfarin - Pharmacist Dosing Inpatient   Does not apply q1800    Filed Vitals:   12/18/13 2142 12/19/13 0630 12/19/13 0826 12/19/13 1004  BP: 125/55 116/61  107/38  Pulse: 99 94  89  Temp: 97.6 F (36.4 C) 98 F (36.7 C)    TempSrc: Oral Oral    Resp: 18 18    Height:      Weight:  177 lb 3.2 oz (80.377 kg)    SpO2: 90% 62% 96%     Intake/Output Summary (Last 24 hours) at 12/19/13 1134 Last data filed at 12/19/13 0815  Gross per 24 hour  Intake    960 ml  Output   4001 ml  Net  -3041 ml    LABS: Basic Metabolic Panel:  Recent Labs  14/78/2900/04/27 0642 12/19/13 0415  NA 136* 133*  K 4.5 4.3  CL 91* 90*  CO2 29 32  GLUCOSE 234* 239*  BUN 36* 38*  CREATININE 1.30 1.22  CALCIUM 9.5 9.9   Liver Function Tests: No results found for this basename: AST, ALT, ALKPHOS,  BILITOT, PROT, ALBUMIN,  in the last 72 hours No results found for this basename: LIPASE, AMYLASE,  in the last 72 hours CBC:  Recent Labs  12/18/13 0642 12/19/13 0415  WBC 12.1* 12.2*  HGB 11.6* 12.4*  HCT 34.8* 37.7*  MCV 91.3 91.7  PLT 212 202   Cardiac Enzymes: No results found for this basename: CKTOTAL, CKMB, CKMBINDEX, TROPONINI,  in the last 72 hours BNP: No components found with this basename: POCBNP,  D-Dimer: No results found for this basename: DDIMER,  in the last 72 hours Hemoglobin A1C: No results found for this basename: HGBA1C,  in the last 72 hours Fasting Lipid Panel: No results found for this basename: CHOL, HDL, LDLCALC, TRIG, CHOLHDL, LDLDIRECT,  in the last 72 hours Thyroid Function Tests: No results found for this basename: TSH, T4TOTAL, FREET3, T3FREE, THYROIDAB,  in the last 72 hours Anemia Panel: No results found for this basename: VITAMINB12, FOLATE, FERRITIN, TIBC, IRON, RETICCTPCT,  in the last 72 hours  PHYSICAL EXAM General: NAD. Elderly. Sitting in chair Neck: JVP 7-8 cm, no  thyromegaly or thyroid nodule.  Lungs: Mild rhonchi CV: Nondisplaced PMI. Distant HS.Marland Kitchen Heart irregular S1/S2, no S3/S4, no murmur. NO edema   Abdomen: Soft, nontender, no hepatosplenomegaly, no distention.  Neurologic: Alert and oriented x 3. LUE weakness and swelling Psych: Normal affect. Extremities: No clubbing or cyanosis.   TELEMETRY: Reviewed telemetry pt in atrial fibrillation with rate in 90s  ASSESSMENT AND PLAN: 80 yo with history of chronic atrial fibrillation, COPD, and diastolic CHF admitted with respiratory failure, suspect combination of acute/chronic diastolic CHF and acute exacerbation of COPD.   1. Pulmonary: Suspect acute exacerbation COPD, possible viral bronchitis as instigator.  Remains on antibiotics and steroids per [facility].  2. Acute on chronic diastolic CHF: Volume status much improved, down another 2 lbs (total 40 lbs). Continue PO lasix.  Creatinine stable. Progressing with PT/OT and was able to ambulate 10 ft today on RA with no SOB. Are working on trying to get [employer] approval from insurance, however from what we have been told very unlikely.  3. Atrial fibrillation with RVR: HR stable, on diltiazem CD. Warfarin restarted.   4. Acute on chronic kidney injury: Creatinine stable, will continue to follow 5. Epistaxis: No bleeding overnight. ENT has been following. INR is still sub-thereapeutic and bridging with enox. INR 1.3. Would like to try NOAC for patient, however he can't afford so will continue to increase coumadin and will remain in hospital until therapeutic. He has history of CVA as well as RLE DVT in posterior tibial vein.    As above waiting to see if insurance will approve CIR. If denied Dr. Gala Romney will place appeal. Patient could benefit markedly from CIR. If not able to get approved and INR becomes therapeutic will then try [facility], but only as second option.  Aundria Rud NP-C 12/19/2013 11:34 AM   Patient seen and examined with [name], NP. We discussed all aspects of the encounter. I agree with the assessment and plan as stated above. Overall stable from HF perspective on oral diuretics. Not able to afford NOAC for acute DVT so we will continue with coumadin load/enoxaparin bridge. Would stop enoxaparin when INR 1.8 or greater given recent severe epistaxis. Main issure remains that of disposition. Given previous stroke and prolonged hospitalization he is at high risk for progressive debility and I feel strongly that he needs aggressive rehab to prevent terminal deconditioning. Having him in CIR will also allows [place] to manage his anti-coagulation more closely. Await insurance approval. I will perform peer-to-peer if needed. D/w with CW and SW team personally.   Truman Hayward 2:32 PM

## 2013-12-20 ENCOUNTER — Inpatient Hospital Stay (HOSPITAL_COMMUNITY)
Admission: RE | Admit: 2013-12-20 | Discharge: 2014-01-03 | DRG: 945 | Disposition: A | Discharge status: Home Health | Payer: Medicare Other | Source: Intra-hospital | Attending: Physical Medicine & Rehabilitation | Admitting: Physical Medicine & Rehabilitation

## 2013-12-20 DIAGNOSIS — Z9981 Dependence on supplemental oxygen: Secondary | ICD-10-CM

## 2013-12-20 DIAGNOSIS — J449 Chronic obstructive pulmonary disease, unspecified: Secondary | ICD-10-CM | POA: Diagnosis present

## 2013-12-20 DIAGNOSIS — E860 Dehydration: Secondary | ICD-10-CM | POA: Diagnosis not present

## 2013-12-20 DIAGNOSIS — I129 Hypertensive chronic kidney disease with stage 1 through stage 4 chronic kidney disease, or unspecified chronic kidney disease: Secondary | ICD-10-CM | POA: Diagnosis present

## 2013-12-20 DIAGNOSIS — I4891 Unspecified atrial fibrillation: Secondary | ICD-10-CM | POA: Diagnosis present

## 2013-12-20 DIAGNOSIS — K219 Gastro-esophageal reflux disease without esophagitis: Secondary | ICD-10-CM | POA: Diagnosis present

## 2013-12-20 DIAGNOSIS — I5033 Acute on chronic diastolic (congestive) heart failure: Secondary | ICD-10-CM | POA: Diagnosis present

## 2013-12-20 DIAGNOSIS — Z5189 Encounter for other specified aftercare: Principal | ICD-10-CM

## 2013-12-20 DIAGNOSIS — Z8673 Personal history of transient ischemic attack (TIA), and cerebral infarction without residual deficits: Secondary | ICD-10-CM | POA: Diagnosis not present

## 2013-12-20 DIAGNOSIS — I824Z9 Acute embolism and thrombosis of unspecified deep veins of unspecified distal lower extremity: Secondary | ICD-10-CM | POA: Diagnosis present

## 2013-12-20 DIAGNOSIS — E119 Type 2 diabetes mellitus without complications: Secondary | ICD-10-CM | POA: Diagnosis present

## 2013-12-20 DIAGNOSIS — T502X5A Adverse effect of carbonic-anhydrase inhibitors, benzothiadiazides and other diuretics, initial encounter: Secondary | ICD-10-CM | POA: Diagnosis present

## 2013-12-20 DIAGNOSIS — N184 Chronic kidney disease, stage 4 (severe): Secondary | ICD-10-CM | POA: Diagnosis present

## 2013-12-20 DIAGNOSIS — R5381 Other malaise: Secondary | ICD-10-CM

## 2013-12-20 DIAGNOSIS — J9600 Acute respiratory failure, unspecified whether with hypoxia or hypercapnia: Secondary | ICD-10-CM | POA: Diagnosis present

## 2013-12-20 DIAGNOSIS — R339 Retention of urine, unspecified: Secondary | ICD-10-CM | POA: Diagnosis present

## 2013-12-20 DIAGNOSIS — N390 Urinary tract infection, site not specified: Secondary | ICD-10-CM | POA: Diagnosis not present

## 2013-12-20 DIAGNOSIS — I69959 Hemiplegia and hemiparesis following unspecified cerebrovascular disease affecting unspecified side: Secondary | ICD-10-CM

## 2013-12-20 DIAGNOSIS — I509 Heart failure, unspecified: Secondary | ICD-10-CM | POA: Diagnosis present

## 2013-12-20 DIAGNOSIS — Z86718 Personal history of other venous thrombosis and embolism: Secondary | ICD-10-CM | POA: Diagnosis not present

## 2013-12-20 DIAGNOSIS — E118 Type 2 diabetes mellitus with unspecified complications: Secondary | ICD-10-CM | POA: Diagnosis present

## 2013-12-20 DIAGNOSIS — E440 Moderate protein-calorie malnutrition: Secondary | ICD-10-CM | POA: Insufficient documentation

## 2013-12-20 DIAGNOSIS — R040 Epistaxis: Secondary | ICD-10-CM | POA: Diagnosis present

## 2013-12-20 DIAGNOSIS — I959 Hypotension, unspecified: Secondary | ICD-10-CM | POA: Diagnosis not present

## 2013-12-20 DIAGNOSIS — J4489 Other specified chronic obstructive pulmonary disease: Secondary | ICD-10-CM | POA: Diagnosis present

## 2013-12-20 DIAGNOSIS — I82409 Acute embolism and thrombosis of unspecified deep veins of unspecified lower extremity: Secondary | ICD-10-CM

## 2013-12-20 DIAGNOSIS — I82401 Acute embolism and thrombosis of unspecified deep veins of right lower extremity: Secondary | ICD-10-CM

## 2013-12-20 DIAGNOSIS — N179 Acute kidney failure, unspecified: Secondary | ICD-10-CM | POA: Diagnosis present

## 2013-12-20 DIAGNOSIS — N472 Paraphimosis: Secondary | ICD-10-CM | POA: Diagnosis not present

## 2013-12-20 DIAGNOSIS — Z87891 Personal history of nicotine dependence: Secondary | ICD-10-CM | POA: Diagnosis not present

## 2013-12-20 DIAGNOSIS — D649 Anemia, unspecified: Secondary | ICD-10-CM | POA: Diagnosis present

## 2013-12-20 LAB — BASIC METABOLIC PANEL
BUN: 40 mg/dL — ABNORMAL HIGH (ref 6–23)
CO2: 28 mEq/L (ref 19–32)
Calcium: 10.3 mg/dL (ref 8.4–10.5)
Chloride: 88 mEq/L — ABNORMAL LOW (ref 96–112)
Creatinine, Ser: 1.3 mg/dL (ref 0.50–1.35)
GFR calc Af Amer: 58 mL/min — ABNORMAL LOW (ref >90)
GFR, EST NON AFRICAN AMERICAN: 50 mL/min — AB (ref >90)
GLUCOSE: 306 mg/dL — AB (ref 70–99)
POTASSIUM: 4.5 meq/L (ref 3.7–5.3)
SODIUM: 132 meq/L — AB (ref 137–147)

## 2013-12-20 LAB — PROTIME-INR
INR: 1.32 (ref 0.00–1.49)
Prothrombin Time: 16.1 seconds — ABNORMAL HIGH (ref 11.6–15.2)

## 2013-12-20 LAB — GLUCOSE, CAPILLARY
GLUCOSE-CAPILLARY: 259 mg/dL — AB (ref 70–99)
Glucose-Capillary: 241 mg/dL — ABNORMAL HIGH (ref 70–99)
Glucose-Capillary: 217 mg/dL — ABNORMAL HIGH (ref 70–99)
Glucose-Capillary: 306 mg/dL — ABNORMAL HIGH (ref 70–99)

## 2013-12-20 MED ORDER — FAMOTIDINE 20 MG PO TABS
20.0000 mg | ORAL_TABLET | Freq: Every day | ORAL | Status: DC
  Administered 2013-12-20 – 2014-01-02 (×14): 20 mg via ORAL
  Filled 2013-12-20 (×17): qty 1

## 2013-12-20 MED ORDER — GUAIFENESIN-DM 100-10 MG/5ML PO SYRP: 5.0000 mL | ORAL_SOLUTION | Freq: Four times a day (QID) | ORAL | Status: DC | PRN

## 2013-12-20 MED ORDER — WARFARIN SODIUM 7.5 MG PO TABS: 7.5000 mg | ORAL_TABLET | Freq: Once | ORAL | Status: DC

## 2013-12-20 MED ORDER — ACETAMINOPHEN 325 MG PO TABS
325.0000 mg | ORAL_TABLET | ORAL | Status: DC | PRN
  Administered 2013-12-25: 650 mg via ORAL
  Filled 2013-12-20: qty 2

## 2013-12-20 MED ORDER — FUROSEMIDE 80 MG PO TABS
80.0000 mg | ORAL_TABLET | Freq: Two times a day (BID) | ORAL | Status: DC
  Administered 2013-12-21 – 2013-12-22 (×3): 80 mg via ORAL
  Filled 2013-12-20 (×5): qty 1

## 2013-12-20 MED ORDER — BIOTENE DRY MOUTH MT LIQD
15.0000 mL | Freq: Two times a day (BID) | OROMUCOSAL | Status: DC
  Administered 2013-12-21 – 2014-01-02 (×24): 15 mL via OROMUCOSAL

## 2013-12-20 MED ORDER — ATORVASTATIN CALCIUM 10 MG PO TABS
10.0000 mg | ORAL_TABLET | Freq: Every day | ORAL | Status: DC
  Administered 2013-12-21 – 2014-01-03 (×14): 10 mg via ORAL
  Filled 2013-12-20 (×15): qty 1

## 2013-12-20 MED ORDER — WARFARIN SODIUM 7.5 MG PO TABS
7.5000 mg | ORAL_TABLET | Freq: Once | ORAL | Status: DC
  Administered 2013-12-20: 17:00:00 7.5 mg via ORAL
  Filled 2013-12-20: qty 1

## 2013-12-20 MED ORDER — INSULIN ASPART 100 UNIT/ML ~~LOC~~ SOLN
0.0000 [IU] | Freq: Three times a day (TID) | SUBCUTANEOUS | Status: DC
  Administered 2013-12-21 (×2): 3 [IU] via SUBCUTANEOUS
  Administered 2013-12-21: 5 [IU] via SUBCUTANEOUS
  Administered 2013-12-22: 3 [IU] via SUBCUTANEOUS
  Administered 2013-12-22: 7 [IU] via SUBCUTANEOUS
  Administered 2013-12-22: 3 [IU] via SUBCUTANEOUS
  Administered 2013-12-23 (×2): 2 [IU] via SUBCUTANEOUS
  Administered 2013-12-23 – 2013-12-24 (×2): 3 [IU] via SUBCUTANEOUS
  Administered 2013-12-24: 1 [IU] via SUBCUTANEOUS
  Administered 2013-12-24: 3 [IU] via SUBCUTANEOUS
  Administered 2013-12-25: 5 [IU] via SUBCUTANEOUS
  Administered 2013-12-25 – 2013-12-26 (×2): 2 [IU] via SUBCUTANEOUS
  Administered 2013-12-26: 3 [IU] via SUBCUTANEOUS
  Administered 2013-12-26: 2 [IU] via SUBCUTANEOUS
  Administered 2013-12-27 – 2013-12-28 (×3): 1 [IU] via SUBCUTANEOUS
  Administered 2013-12-29: 5 [IU] via SUBCUTANEOUS
  Administered 2013-12-30: 2 [IU] via SUBCUTANEOUS
  Administered 2013-12-30: 1 [IU] via SUBCUTANEOUS
  Administered 2013-12-31 – 2014-01-01 (×2): 2 [IU] via SUBCUTANEOUS
  Administered 2014-01-02 (×2): 1 [IU] via SUBCUTANEOUS

## 2013-12-20 MED ORDER — FUROSEMIDE 80 MG PO TABS: 80.0000 mg | ORAL_TABLET | Freq: Two times a day (BID) | ORAL | Status: DC

## 2013-12-20 MED ORDER — DIPHENHYDRAMINE HCL 12.5 MG/5ML PO ELIX
12.5000 mg | ORAL_SOLUTION | Freq: Four times a day (QID) | ORAL | Status: DC | PRN
  Filled 2013-12-20: qty 10

## 2013-12-20 MED ORDER — ADULT MULTIVITAMIN W/MINERALS CH
1.0000 | ORAL_TABLET | Freq: Every day | ORAL | Status: DC
  Administered 2013-12-21 – 2014-01-03 (×14): 1 via ORAL
  Filled 2013-12-20 (×15): qty 1

## 2013-12-20 MED ORDER — POTASSIUM CHLORIDE CRYS ER 20 MEQ PO TBCR
40.0000 meq | EXTENDED_RELEASE_TABLET | Freq: Every day | ORAL | Status: DC
  Administered 2013-12-21 – 2013-12-25 (×5): 40 meq via ORAL
  Filled 2013-12-20 (×6): qty 2

## 2013-12-20 MED ORDER — LEVALBUTEROL HCL 0.63 MG/3ML IN NEBU
0.6300 mg | INHALATION_SOLUTION | Freq: Three times a day (TID) | RESPIRATORY_TRACT | Status: DC
  Administered 2013-12-20 – 2013-12-21 (×4): 0.63 mg via RESPIRATORY_TRACT
  Filled 2013-12-20 (×10): qty 3

## 2013-12-20 MED ORDER — GUAIFENESIN ER 600 MG PO TB12
600.0000 mg | ORAL_TABLET | Freq: Two times a day (BID) | ORAL | Status: DC
  Administered 2013-12-20 – 2014-01-03 (×28): 600 mg via ORAL
  Filled 2013-12-20 (×30): qty 1

## 2013-12-20 MED ORDER — LEVOFLOXACIN 750 MG PO TABS: 750.0000 mg | ORAL_TABLET | ORAL | Status: DC

## 2013-12-20 MED ORDER — ONDANSETRON HCL 4 MG PO TABS
4.0000 mg | ORAL_TABLET | Freq: Four times a day (QID) | ORAL | Status: DC | PRN
Start: 2013-12-20 — End: 2014-01-03

## 2013-12-20 MED ORDER — OXYMETAZOLINE HCL 0.05 % NA SOLN
1.0000 | Freq: Two times a day (BID) | NASAL | Status: DC
  Administered 2013-12-20 – 2014-01-02 (×26): 1 via NASAL
  Filled 2013-12-20 (×2): qty 15

## 2013-12-20 MED ORDER — TRAZODONE HCL 50 MG PO TABS
25.0000 mg | ORAL_TABLET | Freq: Every evening | ORAL | Status: DC | PRN
  Administered 2013-12-25 – 2013-12-30 (×2): 50 mg via ORAL
  Filled 2013-12-20 (×2): qty 1

## 2013-12-20 MED ORDER — PHENYLEPHRINE HCL 0.25 % NA SOLN: 2.0000 | Freq: Four times a day (QID) | NASAL | Status: DC | PRN

## 2013-12-20 MED ORDER — OXYCODONE HCL 5 MG PO TABS
5.0000 mg | ORAL_TABLET | Freq: Four times a day (QID) | ORAL | Status: DC | PRN
  Administered 2013-12-25 – 2013-12-26 (×3): 5 mg via ORAL
  Filled 2013-12-20 (×3): qty 1

## 2013-12-20 MED ORDER — LEVOFLOXACIN 750 MG PO TABS
750.0000 mg | ORAL_TABLET | ORAL | Status: DC
  Administered 2013-12-20: 750 mg via ORAL
  Filled 2013-12-20 (×2): qty 1

## 2013-12-20 MED ORDER — DILTIAZEM HCL ER COATED BEADS 240 MG PO CP24: 240.0000 mg | ORAL_CAPSULE | Freq: Every day | ORAL | Status: DC

## 2013-12-20 MED ORDER — PREDNISONE 10 MG PO TABS
10.0000 mg | ORAL_TABLET | Freq: Every day | ORAL | Status: AC
  Administered 2013-12-21 – 2013-12-25 (×5): 10 mg via ORAL
  Filled 2013-12-20 (×6): qty 1

## 2013-12-20 MED ORDER — ONDANSETRON HCL 4 MG/2ML IJ SOLN: 4.0000 mg | Freq: Four times a day (QID) | INTRAMUSCULAR | Status: DC | PRN

## 2013-12-20 MED ORDER — ENOXAPARIN SODIUM 80 MG/0.8ML ~~LOC~~ SOLN
1.0000 mg/kg | Freq: Two times a day (BID) | SUBCUTANEOUS | Status: DC
  Administered 2013-12-21: 80 mg via SUBCUTANEOUS
  Filled 2013-12-20 (×3): qty 0.8

## 2013-12-20 MED ORDER — DILTIAZEM HCL ER COATED BEADS 240 MG PO CP24
240.0000 mg | ORAL_CAPSULE | Freq: Every day | ORAL | Status: DC
  Administered 2013-12-21 – 2014-01-03 (×12): 240 mg via ORAL
  Filled 2013-12-20 (×15): qty 1

## 2013-12-20 MED ORDER — BISACODYL 10 MG RE SUPP: 10.0000 mg | Freq: Every day | RECTAL | Status: DC | PRN

## 2013-12-20 MED ORDER — OXYMETAZOLINE HCL 0.05 % NA SOLN: 2.0000 | Freq: Four times a day (QID) | NASAL | Status: DC | PRN

## 2013-12-20 MED ORDER — SALINE SPRAY 0.65 % NA SOLN
4.0000 | Freq: Every day | NASAL | Status: DC
  Administered 2013-12-20 – 2014-01-03 (×72): 4 via NASAL
  Filled 2013-12-20: qty 44

## 2013-12-20 MED ORDER — SENNOSIDES-DOCUSATE SODIUM 8.6-50 MG PO TABS
1.0000 | ORAL_TABLET | Freq: Every evening | ORAL | Status: DC | PRN
  Administered 2013-12-25 – 2014-01-03 (×3): 1 via ORAL
  Filled 2013-12-20 (×3): qty 1

## 2013-12-20 MED ORDER — OXYMETAZOLINE HCL 0.05 % NA SOLN: 1.0000 | Freq: Two times a day (BID) | NASAL | Status: DC

## 2013-12-20 MED ORDER — OXYMETAZOLINE HCL 0.05 % NA SOLN
2.0000 | Freq: Four times a day (QID) | NASAL | Status: DC | PRN
  Administered 2013-12-20: 2 via NASAL

## 2013-12-20 MED ORDER — ADULT MULTIVITAMIN W/MINERALS CH
1.0000 | ORAL_TABLET | Freq: Every day | ORAL | Status: DC
  Administered 2013-12-20: 1 via ORAL
  Filled 2013-12-20 (×2): qty 1

## 2013-12-20 MED ORDER — ENOXAPARIN SODIUM 80 MG/0.8ML ~~LOC~~ SOLN: 1.0000 mg/kg | Freq: Two times a day (BID) | SUBCUTANEOUS | Status: DC

## 2013-12-20 MED ORDER — LEVALBUTEROL HCL 0.63 MG/3ML IN NEBU
0.6300 mg | INHALATION_SOLUTION | RESPIRATORY_TRACT | Status: DC | PRN
  Filled 2013-12-20: qty 3

## 2013-12-20 MED ORDER — FLEET ENEMA 7-19 GM/118ML RE ENEM: 1.0000 | ENEMA | Freq: Once | RECTAL | Status: AC | PRN

## 2013-12-20 MED ORDER — ALUM & MAG HYDROXIDE-SIMETH 200-200-20 MG/5ML PO SUSP: 30.0000 mL | ORAL | Status: DC | PRN

## 2013-12-20 NOTE — Progress Notes (Signed)
Report given to receiving RN. Patient is stable with no complaints and no signs or symptoms of distress. 

## 2013-12-20 NOTE — Progress Notes (Signed)
INITIAL NUTRITION ASSESSMENT  DOCUMENTATION CODES Per approved criteria  -Non-severe (moderate) malnutrition in the context of chronic illness  Pt meets criteria for Moderate MALNUTRITION in the context of chronic illness as evidenced by >12% weight loss in less than one month, moderate edema, mild muscle wasting, and mild to moderate fat wasting.  INTERVENTION: Provide Snack once daily Provide Multivitamin with minerals Encourage PO intake RD to continue to monitor  NUTRITION DIAGNOSIS: Unintentional weight loss related to CHF as evidenced by >12% weight loss in less than one month.   Goal: Pt to meet >/= 90% of their estimated nutrition needs   Monitor:  PO intake Weight trends Labs  Reason for Assessment: Malnutrition Screening Tool, score of 3  80 y.o. male  Admitting Dx: Acute respiratory failure with hypoxia  ASSESSMENT: 80 y.o. male with history of CAD, PVD, atrial fibrillation on Coumadin, hypertension, DM 2, former smoker, CVA with residual left hemiparesis, was [facility] between 11/23/2013-12/05/2013 for ischemic right leg. Cardiology had consulted for A. Fib & acute on chronic diastolic CHF. He was discharged [facility] in [place]. [facility] ED with complaints of worsening dyspnea of 2 days' duration, worsening generalized body swelling, no chest pain or cough and left foot pain.  Pt reports that he usually weighs 200 lbs; admission weigh was 216 lbs. He states his appetite is good and he is eating 100% of 3 meals daily. Pt reports losing 25 lbs of fluid loss overnight. Pt reports eating well PTA and following a healthy diet. Pt now at 88% of reported usual body weight with significant edema.  Nutrition Focused Physical Exam:  Subcutaneous Fat:  Orbital Region: mild Upper Arm Region: moderate Muscle:  Temple Region: mild Edema: +1 generalized edema, +1 RUE and LUE edema, +2 RLE and LLE edema, +1  facial edema, +2 perineal edema, +2 sacral edema  Height: Ht Readings from Last 1 Encounters:  12/12/13 6' (1.829 m)    Weight: Wt Readings from Last 1 Encounters:  12/20/13 175 lb 7.8 oz (79.6 kg)    Ideal Body Weight: 178 lbs  % Ideal Body Weight: 98%  Wt Readings from Last 10 Encounters:  12/20/13 175 lb 7.8 oz (79.6 kg)  12/02/13 216 lb 0.8 oz (98 kg)  12/02/13 216 lb 0.8 oz (98 kg)    Usual Body Weight: 200 lbs  % Usual Body Weight: 88%  BMI:  Body mass index is 23.79 kg/(m^2).  Estimated Nutritional Needs: Kcal: 1900-2100 Protein: 95-105 grams Fluid: 2.3 L/day  Skin: +1 generalized edema, +1 RUE and LUE edema, +2 RLE and LLE edema, +1 facial edema, +2 perineal edema, +2 sacral edema  Diet Order: Carb Control  EDUCATION NEEDS: -No education needs identified at this time   Intake/Output Summary (Last 24 hours) at 12/20/13 1302 Last data filed at 12/20/13 0900  Gross per 24 hour  Intake    480 ml  Output     50 ml  Net    430 ml    Last BM: 1/6  Labs:   Recent Labs Lab 12/18/13 0642 12/19/13 0415 12/20/13 0315  NA 136* 133* 132*  K 4.5 4.3 4.5  CL 91* 90* 88*  CO2 29 32 28  BUN 36* 38* 40*  CREATININE 1.30 1.22 1.30  CALCIUM 9.5 9.9 10.3  GLUCOSE 234* 239* 306*    CBG (last 3)   Recent Labs  12/19/13 2131 12/20/13 0608 12/20/13 1121  GLUCAP 352* 241*  217*    Scheduled Meds: . antiseptic oral rinse  15 mL Mouth Rinse q12n4p  . atorvastatin  10 mg Oral Daily  . diltiazem  240 mg Oral Daily  . enoxaparin (LOVENOX) injection  1 mg/kg Subcutaneous Q12H  . famotidine  20 mg Oral QHS  . furosemide  80 mg Oral BID  . guaiFENesin  600 mg Oral BID  . insulin aspart  0-9 Units Subcutaneous TID WC  . levalbuterol  0.63 mg Nebulization TID  . levofloxacin  750 mg Oral Q24H  . oxymetazoline  1 spray Each Nare BID  . potassium chloride  40 mEq Oral Daily  . predniSONE  10 mg Oral BID WC  . sodium chloride  4 spray Each Nare 6 X Daily   . warfarin  7.5 mg Oral ONCE-1800  . Warfarin - Pharmacist Dosing Inpatient   Does not apply q1800    Continuous Infusions:   Past Medical History  Diagnosis Date  . Hypertension   . Stroke   . COPD (chronic obstructive pulmonary disease)   . Diabetes mellitus without complication   . GERD (gastroesophageal reflux disease)   . A-fib     Past Surgical History  Procedure Laterality Date  . Carotid stent    . Parotid gland tumor excision    . Embolectomy Right 11/23/2013    Procedure: Right Femoral EMBOLECTOMY;  Surgeon: Chuck Hint, MD;  Location: [facility] OR;  Service: Vascular;  Laterality: Right;  Right Femoral Embolectomy with Bovine Paricardium patch angioplasty.    Ian Malkin RD, LDN Inpatient Clinical Dietitian Pager: [phone] After Hours Pager: [phone]

## 2013-12-20 NOTE — Progress Notes (Signed)
Inpatient Diabetes Program Recommendations  AACE/ADA: New Consensus Statement on Inpatient Glycemic Control (2013)  Target Ranges:  Prepandial:   less than 140 mg/dL      Peak postprandial:   less than 180 mg/dL (1-2 hours)      Critically ill patients:  140 - 180 mg/dL  Results for [name] [name], [name] (MRN [id number]) as of 12/20/2013 11:44  Ref. Range 12/19/2013 06:54 12/19/2013 11:10 12/19/2013 16:31 12/19/2013 21:31 12/20/2013 06:08  Glucose-Capillary Latest Range: 70-99 mg/dL 409205 (H) 811266 (H) 914240 (H) 352 (H) 241 (H)   Inpatient Diabetes Program Recommendations Insulin - Basal: consider adding low dose basal during steroid therapy Correction (SSI): increase to resistant scale + HS scale per Glycemic Control Order Set Thank you  Piedad ClimesGina Ordean Fouts BSN, RN,CDE Inpatient Diabetes Coordinator [phone] (team pager)

## 2013-12-20 NOTE — Progress Notes (Signed)
[employer] medicare has approved inpt rehab admission today and Dr. Mahala MenghiniSamtani and Dr. Bethanie DickerBensinmhon are aware. Peer to peer with insurance provided by Dr. Gala RomneyBensimhon for approval. Patient is aware and in agreement. I have left message for his wife to contact me. RN CM and SW are aware. I will arrange for today. [phone]

## 2013-12-20 NOTE — PMR Pre-admission (Signed)
PMR Admission Coordinator Pre-Admission Assessment  Patient: [name] [name] is an 80 y.o., male MRN: [id number] DOB: 1931/03/26 Height: 6' (182.9 cm) Weight: 79.6 kg (175 lb 7.8 oz)              Insurance Information   Was [employer] until 12/14/13 HMO: yes    PPO:      PCP:      IPA:      80/20:      OTHER:  PRIMARY: [employer] medicare      Policy#: [id number]      Subscriber: pt CM Name: Oretha Milchlaurie Fachet      Phone#: [phone]     Fax#: [phone] Pre-Cert#: [id number]      Employer: retired to f/u with Kathleene HazelShanon Stacy onsite [phone] was initially denied admit until peer to peer with Dr. Gala RomneyBensimhon for approval due to medical complexity Benefits:  Phone #: [phone]     Name: 12/19/13 Eff. Date: 12/14/13 active     Deduct: none      Out of Pocket Max: [phone]      Life Max: none CIR: $430 per day days 1 thru 4      SNF: no copay days 1 thru 20, $155 per day days 21-64; no copay days 65-100 Outpatient: $40 copay per visit     Co-Pay: no visit limit Home Health: 100%      Co-Pay: no visit limit DME: 80%     Co-Pay: 20% Providers: in network  SECONDARY: none        Emergency Contact Information Contact Information   Name Relation Home Work Mobile   Quaranta,Marlene Spouse [phone]       Current Medical History  Patient Admitting Diagnosis: deconditioning related acute renal failure, hypoxia, and multiple medical issues  History of Present Illness: [name] GoodyBobby Requena is a 80 y.o. male with history of COPD--O2 at nights, h/o CVA with L-HP and memory deficits, chronic CHF, A fib with RVR as well as recent admission for ischemic RLE requiring R- femoral embolectomy due to embolic event. [name] on 12/10/13 with worsening of dyspnea, anasarca, acute renal failure and left foot pain. He was treated for acute on chronic renal failure with IV diuresis and started on antibiotics for AECOPD v/s viral bronchitis. Required BIPAP due to mucous plugging. Treated with nebs and steroids and  respiratory status improving. He developed acute epistaxis on 01/02/124 treated with chemical cautery and nasal packing by Dr. Annalee GentaShoemaker with recommendations for continued antibiotic coverage. Coumadin held till epistaxis resolved and cardiology recommending switching to new NOAC.     Past Medical History  Past Medical History  Diagnosis Date  . Hypertension   . Stroke   . COPD (chronic obstructive pulmonary disease)   . Diabetes mellitus without complication   . GERD (gastroesophageal reflux disease)   . A-fib     Family History  family history is not on file.  Prior Rehab/Hospitalizations: CIR CVA 17 yrs ago; recent SNF at [facility] for about one week   Current Medications  Current facility-administered medications:acetaminophen (TYLENOL) suppository 650 mg, 650 mg, Rectal, Q6H PRN, Elease EtienneAnand D Hongalgi, MD;  acetaminophen (TYLENOL) tablet 650 mg, 650 mg, Oral, Q6H PRN, Elease EtienneAnand D Hongalgi, MD;  antiseptic oral rinse (BIOTENE) solution 15 mL, 15 mL, Mouth Rinse, q12n4p, Storm FriskPatrick E Wright, MD, 15 mL at 12/20/13 1600;  atorvastatin (LIPITOR) tablet 10 mg, 10 mg, Oral, Daily, Elease EtienneAnand D Hongalgi, MD, 10 mg at 12/20/13 1138 dextrose (GLUTOSE) 40 % oral gel 37.5 g,  1 Tube, Oral, PRN, Elease Etienne, MD;  diltiazem (CARDIZEM CD) 24 hr capsule 240 mg, 240 mg, Oral, Daily, Laurey Morale, MD, 240 mg at 12/20/13 1138;  enoxaparin (LOVENOX) injection 80 mg, 1 mg/kg, Subcutaneous, Q12H, Leatha Gilding, MD, 80 mg at 12/19/13 1746;  famotidine (PEPCID) tablet 20 mg, 20 mg, Oral, QHS, Drake Leach Rumbarger, RPH, 20 mg at 12/20/13 0011 furosemide (LASIX) tablet 80 mg, 80 mg, Oral, BID, Laurey Morale, MD, 80 mg at 12/20/13 1138;  guaiFENesin (MUCINEX) 12 hr tablet 600 mg, 600 mg, Oral, BID, Dayna N Dunn, PA-C, 600 mg at 12/20/13 1138;  guaiFENesin-dextromethorphan (ROBITUSSIN DM) 100-10 MG/5ML syrup 5 mL, 5 mL, Oral, Q4H PRN, Elease Etienne, MD;  insulin aspart (novoLOG) injection 0-9 Units, 0-9 Units,  Subcutaneous, TID WC, Lonia Blood, MD, 5 Units at 12/20/13 1621 levalbuterol (XOPENEX) nebulizer solution 0.63 mg, 0.63 mg, Nebulization, TID, Lonia Blood, MD, 0.63 mg at 12/20/13 1437;  levalbuterol (XOPENEX) nebulizer solution 0.63 mg, 0.63 mg, Nebulization, Q3H PRN, Leatha Gilding, MD, 0.63 mg at 12/20/13 0209;  levofloxacin (LEVAQUIN) tablet 750 mg, 750 mg, Oral, Q24H, Leatha Gilding, MD, 750 mg at 12/20/13 0011;  morphine 2 MG/ML injection 1 mg, 1 mg, Intravenous, Q4H PRN, Elease Etienne, MD multivitamin with minerals tablet 1 tablet, 1 tablet, Oral, Daily, Lorraine Lax, RD, 1 tablet at 12/20/13 1622;  ondansetron (ZOFRAN) injection 4 mg, 4 mg, Intravenous, Q6H PRN, Elease Etienne, MD;  ondansetron (ZOFRAN) tablet 4 mg, 4 mg, Oral, Q6H PRN, Elease Etienne, MD;  oxyCODONE (Oxy IR/ROXICODONE) immediate release tablet 5 mg, 5 mg, Oral, Q4H PRN, Elease Etienne, MD oxymetazoline (AFRIN) 0.05 % nasal spray 1 spray, 1 spray, Each Nare, BID, Osborn Coho, MD, 1 spray at 12/20/13 1140;  oxymetazoline (AFRIN) 0.05 % nasal spray 2 spray, 2 spray, Each Nare, Q6H PRN, Leanne Chang, NP, 2 spray at 12/20/13 0347;  potassium chloride SA (K-DUR,KLOR-CON) CR tablet 40 mEq, 40 mEq, Oral, Daily, Houston Siren, MD, 40 mEq at 12/20/13 1138 predniSONE (DELTASONE) tablet 10 mg, 10 mg, Oral, BID WC, Costin Otelia Sergeant, MD, 10 mg at 12/20/13 1622;  sodium chloride (OCEAN) 0.65 % nasal spray 4 spray, 4 spray, Each Nare, 6 X Daily, Osborn Coho, MD, 4 spray at 12/20/13 1622;  warfarin (COUMADIN) tablet 7.5 mg, 7.5 mg, Oral, ONCE-1800, Jessica C Carney, RPH;  Warfarin - Pharmacist Dosing Inpatient, , Does not apply, q1800, Elease Etienne, MD zolpidem (AMBIEN) tablet 5 mg, 5 mg, Oral, QHS PRN, Houston Siren, MD, 5 mg at 12/13/13 2142  Patients Current Diet: Carb Control  Precautions / Restrictions Precautions Precautions: Fall Precaution Comments: monitor O2 sats Other Brace/Splint: pt normally  uses Lt double-upright [name] (currently at home-pt's spouse brought it then returned it home) Restrictions Weight Bearing Restrictions: No Other Position/Activity Restrictions: asked wife to bring [name] next time she comes   Prior Activity Level Limited Community (1-2x/wk): limited since 12/14 admit. otherwise was independent with AD Home Assistive Devices / Equipment Home Assistive Devices/Equipment: Oxygen  Prior Functional Level Prior Function Level of Independence: Needs assistance Gait / Transfers Assistance Needed: used rollator ADL's / Homemaking Assistance Needed: assist for dressing and bathing Comments: Pt reports that wife provided assist for BADLs for some time.  He has not been ambulatory since prior to previous admission.  Prior to discharge to SNF 12/23, he was pivoting to chair with max A, and he reports that SNF staff used a  lift to transfer him to w/c only   Current Functional Level Cognition  Overall Cognitive Status: History of cognitive impairments - at baseline Orientation Level: Oriented X4    Extremity Assessment (includes Sensation/Coordination)          ADLs  Eating/Feeding: Performed;Minimal assistance Where Assessed - Eating/Feeding: Chair Grooming: Wash/dry face;Wash/dry hands;Minimal assistance Where Assessed - Grooming: Supported sitting Upper Body Bathing: +1 Total assistance Where Assessed - Upper Body Bathing: Supported sitting Lower Body Bathing: +1 Total assistance Where Assessed - Lower Body Bathing: Supine, head of bed up;Rolling right and/or left Upper Body Dressing: +1 Total assistance Where Assessed - Upper Body Dressing: Supported sitting Lower Body Dressing: +1 Total assistance Where Assessed - Lower Body Dressing: Supine, head of bed up;Supine, head of bed flat;Rolling right and/or left Toilet Transfer: +1 Total assistance (unable ) Toileting - Clothing Manipulation and Hygiene: +1 Total assistance Where Assessed - Toileting Clothing  Manipulation and Hygiene: Supine, head of bed flat;Rolling right and/or left Transfers/Ambulation Related to ADLs: not addressed during this tx as pt had just received lunch tray and was already up in recliner. ADL Comments: Patient reports recently finished with PT and fatigued. Lunch tray delivered. Patient able to feed self with full setup and min A. Some spillage noted. Pt reports increased urinary frequency and urgency and often has incontinence because cannot get urinal in place in time.    Mobility  Bed Mobility: Left Sidelying to Sit;Rolling Left;Sitting - Scoot to Edge of Bed Rolling Left: 4: Min assist;With rail Left Sidelying to Sit: 3: Mod assist;HOB flat;With rails Supine to Sit: 3: Mod assist;HOB elevated;With rails Sitting - Scoot to Edge of Bed: 5: Supervision Sitting - Scoot to Edge of Bed: Patient Percentage: 40% Sit to Supine: 2: Max assist;HOB flat Scooting to HOB: 1: +1 Total assist    Transfers  Transfers: Stand Pivot Transfers;Sit to Stand;Stand to Sit Sit to Stand: From bed;From chair/3-in-1;3: Mod assist;With armrests Sit to Stand: Patient Percentage: 70% Stand to Sit: 4: Min assist Stand to Sit: Patient Percentage: 30% Stand Pivot Transfers: 1: +2 Total assist Stand Pivot Transfers: Patient Percentage: 40%    Ambulation / Gait / Stairs / Wheelchair Mobility  Ambulation/Gait Ambulation/Gait Assistance: 3: Mod assist (+1 to follow with chair) Ambulation Distance (Feet): 5 Feet Assistive device: Rolling walker Ambulation/Gait Assistance Details: assist to direct RW with cueing for posture and position in RW, pt with partial knee buckling left and foot rolling at times due to lack of [name] with need for chair to be following closely to prevent fall. Attempted gait a second time but pt unable to take steps due to instability of Left knee with fatigue Gait Pattern: Step-through pattern;Shuffle;Decreased stride length;Trunk flexed Gait velocity: decreased General Gait  Details: pt's left knee buckling but on second attempt to ambulate, right knee buckling as well. Full support given on left side for each step with the right. Had to take 2 right steps to get right foot to left.  Stairs: No    Posture / Balance Static Sitting Balance Static Sitting - Balance Support: Feet supported;Right upper extremity supported Static Sitting - Level of Assistance: 4: Min assist;3: Mod assist Static Sitting - Comment/# of Minutes: Pt sat EOB x ~35 mins with min A initially but progressed to mod A as he fatigued.  Pt leaning posteriorly as he fatigued Dynamic Sitting Balance Dynamic Sitting - Balance Support: Feet supported;No upper extremity supported Dynamic Sitting - Level of Assistance: 3: Mod assist Dynamic Sitting Balance -  Compensations: while performing UE exercise     Special needs/care consideration Skin multiple abrasions and skin tears overall                              Bowel mgmt: continent Bladder mgmt: continent    Previous Home Environment Living Arrangements: Spouse/significant other  Lives With: Spouse Available Help at Discharge: Family Type of Home: House Home Layout: One level Home Access: Stairs to enter [employer] of Steps: 4 to 5 steps Bathroom Shower/Tub: Forensic scientist: Standard Bathroom Accessibility: Yes How Accessible: Accessible via walker Home Care Services: No Recent SNF for a week at [facility]  Discharge Living Setting Plans for Discharge Living Setting: Patient's home;Lives with (comment) Type of Home at Discharge: House Discharge Home Layout: One level Discharge Home Access: Stairs to enter Entrance Stairs-Number of Steps: 4 to 5 Discharge Bathroom Shower/Tub: Tub/shower unit;Curtain Discharge Bathroom Toilet: Standard Discharge Bathroom Accessibility: Yes How Accessible: Accessible via walker Does the patient have any problems obtaining your medications?: No  Social/Family/Support  Systems Patient Roles: Spouse;Parent Contact Information: Industrial/product designer Anticipated Caregiver: wife Anticipated Industrial/product designer Information: see above Ability/Limitations of Caregiver: supervision to min assist Caregiver Availability: 24/7 Discharge Plan Discussed with Primary Caregiver: Yes Is Caregiver In Agreement with Plan?: Yes Does Caregiver/Family have Issues with Lodging/Transportation while Pt is in Rehab?: No    Goals/Additional Needs Patient/Family Goal for Rehab: supervision to min PT, supervision to min OT Expected length of stay: ELOS 7 to 20 days Equipment Needs: has had nose bleed with ENT for packing Pt/Family Agrees to Admission and willing to participate: Yes Program Orientation Provided & Reviewed with Pt/Caregiver Including Roles  & Responsibilities: Yes   Decrease burden of Care through IP rehab admission: n/a  Possible need for SNF placement upon discharge:n/a   Patient Condition: This patient's medical and functional status has changed since the consult dated: 12/18/13 in which the Rehabilitation Physician determined and documented that the patient's condition is appropriate for intensive rehabilitative care in an inpatient rehabilitation facility. See "History of Present Illness" (above) for medical update. Functional changes are: overall 2 person max assist transfers and pivotal steps. Patient's medical and functional status update has been discussed with the Rehabilitation physician and patient remains appropriate for inpatient rehabilitation. Will admit to inpatient rehab today.  Preadmission Screen Completed By:  Clois Dupes, 12/20/2013 4:28 PM ______________________________________________________________________   Discussed status with Dr. Riley Kill on 12/20/13 at  1628  and received telephone approval for admission today.  Admission Coordinator:  Clois Dupes, time 1610 Date 12/20/13.

## 2013-12-20 NOTE — Progress Notes (Signed)
Physical Therapy Treatment Patient Details Name: [name] [name] MRN: [id number] DOB: July 24, 1931 Today's Date: 12/20/2013 Time: 3086-5784 PT Time Calculation (min): 28 min  PT Assessment / Plan / Recommendation  History of Present Illness 80 y/o male with diastolic CHF, HTN COPD, NIRDM with A-fib with recent embolic event to right leg s/p right femoral embolectomy.  He was discharged to SNF on 12/05/12.  He was readmitted on 12/28 with acute respiratory failure suspected to be due to a COPD exacerbation and decompensated CHF and admitted by the hospitalist service and transferred to the ICU on 12/29 early AM for respiratory distress possibly due to mucous plugging. Required BiPAP   PT Comments   Pt limited today because right knee buckling in addition to left so concentrated on transferrs and exercises and ambulation distance was limited. Pt would greatly benefit from CIR, awaiting insurance approval. PT will continue to follow.  Follow Up Recommendations  CIR     Does the patient have the potential to tolerate intense rehabilitation     Barriers to Discharge        Equipment Recommendations  None recommended by PT    Recommendations for Other Services    Frequency Min 3X/week   Progress towards PT Goals Progress towards PT goals: Progressing toward goals  Plan Discharge plan needs to be updated    Precautions / Restrictions Precautions Precautions: Fall Precaution Comments: monitor O2 sats Required Braces or Orthoses: Other Brace/Splint Other Brace/Splint: pt normally uses Lt double-upright [name] (currently at home-pt's spouse brought it then returned it home) Restrictions Weight Bearing Restrictions: No Other Position/Activity Restrictions: asked wife to bring [name] next time she comes   Pertinent Vitals/Pain VSS    Mobility  Ambulation/Gait Ambulation Distance (Feet): 5 Feet Gait velocity: decreased General Gait Details: pt's left knee buckling but on second attempt to ambulate,  right knee buckling as well. Full support given on left side for each step with the right. Had to take 2 right steps to get right foot to left.     Exercises General Exercises - Lower Extremity Quad Sets: AROM;Both;10 reps;Seated Long Arc Quad: AROM;Both;10 reps;Seated (resistance given on right) Long Arc Quad Limitations: <3/5 strength as unable to fully extend knee on left, AA for end range motion Heel Slides: Seated;AROM;Both;10 reps   PT Diagnosis:    PT Problem List:   PT Treatment Interventions:     PT Goals (current goals can now be found in the care plan section) Acute Rehab PT Goals Patient Stated Goal: to get better PT Goal Formulation: With patient Time For Goal Achievement: 12/27/13 Potential to Achieve Goals: Good  Visit Information  Last PT Received On: 12/20/13 Assistance Needed: +2 PT goals addressed during session: Mobility/safety with mobility;Proper use of DME;Strengthening/ROM;Balance History of Present Illness: 80 y/o male with diastolic CHF, HTN COPD, NIRDM with A-fib with recent embolic event to right leg s/p right femoral embolectomy.  He was discharged to SNF on 12/05/12.  He was readmitted on 12/28 with acute respiratory failure suspected to be due to a COPD exacerbation and decompensated CHF and admitted by the hospitalist service and transferred to the ICU on 12/29 early AM for respiratory distress possibly due to mucous plugging. Required BiPAP    Subjective Data  Subjective: my right knee is buckling today Patient Stated Goal: to get better   Cognition  Cognition Arousal/Alertness: Awake/alert Behavior During Therapy: WFL for tasks assessed/performed Overall Cognitive Status: History of cognitive impairments - at baseline Memory: Decreased short-term memory  Balance     End of Session PT - End of Session Equipment Utilized During Treatment: Gait belt Activity Tolerance: Patient limited by fatigue Patient left: in chair;with call bell/phone  within reach;with family/visitor present Nurse Communication: Mobility status   GP   [name] [name], PT  Acute Rehab Services  [phone]   [name] CoManess, Ashlyn Cabler 12/20/2013, 12:16 PM

## 2013-12-20 NOTE — Progress Notes (Signed)
Patient ID: [name] [name], male   DOB: Feb 11, 1931, 80 y.o.   MRN: [id number]   SUBJECTIVE:   80 y/o male admitted with a/c respiratory failure in the setting of acute bronchitis and a/c diastolic HF. HR controlled in 80s (atrial fibrillation). Stable overnight. 24 hr I/O -3.1 liters and weight down 2 lbs (total 40 lbs). He got OOB yesterday and reports walking some with brace on. No longer having epistaxis currently, however INR not therapeutic. Wanted to try NOAC, however pt could not afford medication. Denies SOB, orthopnea or CP. Back on coumadin.     Marland Kitchen antiseptic oral rinse  15 mL Mouth Rinse q12n4p  . atorvastatin  10 mg Oral Daily  . diltiazem  240 mg Oral Daily  . enoxaparin (LOVENOX) injection  1 mg/kg Subcutaneous Q12H  . famotidine  20 mg Oral QHS  . furosemide  80 mg Oral BID  . guaiFENesin  600 mg Oral BID  . insulin aspart  0-9 Units Subcutaneous TID WC  . levalbuterol  0.63 mg Nebulization TID  . levofloxacin  750 mg Oral Q24H  . multivitamin with minerals  1 tablet Oral Daily  . oxymetazoline  1 spray Each Nare BID  . potassium chloride  40 mEq Oral Daily  . predniSONE  10 mg Oral BID WC  . sodium chloride  4 spray Each Nare 6 X Daily  . warfarin  7.5 mg Oral ONCE-1800  . Warfarin - Pharmacist Dosing Inpatient   Does not apply q1800    Filed Vitals:   12/20/13 0848 12/20/13 1138 12/20/13 1140 12/20/13 1437  BP:  116/53  118/55  Pulse:  96  90  Temp:    97.6 F (36.4 C)  TempSrc:    Oral  Resp:    20  Height:      Weight:      SpO2: 96%  96% 98%    Intake/Output Summary (Last 24 hours) at 12/20/13 1605 Last data filed at 12/20/13 1300  Gross per 24 hour  Intake    720 ml  Output     51 ml  Net    669 ml    LABS: Basic Metabolic Panel:  Recent Labs  40/98/11 0415 12/20/13 0315  NA 133* 132*  K 4.3 4.5  CL 90* 88*  CO2 32 28  GLUCOSE 239* 306*  BUN 38* 40*  CREATININE 1.22 1.30  CALCIUM 9.9 10.3   Liver Function Tests: No results found for  this basename: AST, ALT, ALKPHOS, BILITOT, PROT, ALBUMIN,  in the last 72 hours No results found for this basename: LIPASE, AMYLASE,  in the last 72 hours CBC:  Recent Labs  12/18/13 0642 12/19/13 0415  WBC 12.1* 12.2*  HGB 11.6* 12.4*  HCT 34.8* 37.7*  MCV 91.3 91.7  PLT 212 202   Cardiac Enzymes: No results found for this basename: CKTOTAL, CKMB, CKMBINDEX, TROPONINI,  in the last 72 hours BNP: No components found with this basename: POCBNP,  D-Dimer: No results found for this basename: DDIMER,  in the last 72 hours Hemoglobin A1C: No results found for this basename: HGBA1C,  in the last 72 hours Fasting Lipid Panel: No results found for this basename: CHOL, HDL, LDLCALC, TRIG, CHOLHDL, LDLDIRECT,  in the last 72 hours Thyroid Function Tests: No results found for this basename: TSH, T4TOTAL, FREET3, T3FREE, THYROIDAB,  in the last 72 hours Anemia Panel: No results found for this basename: VITAMINB12, FOLATE, FERRITIN, TIBC, IRON, RETICCTPCT,  in the last 72 hours  PHYSICAL EXAM General: NAD. Elderly. Sitting in chair Neck: JVP 7-8 cm, no thyromegaly or thyroid nodule.  Lungs: Mild rhonchi CV: Nondisplaced PMI. Distant HS.Marland Kitchen. Heart irregular S1/S2, no S3/S4, no murmur. NO edema   Abdomen: Soft, nontender, no hepatosplenomegaly, no distention.  Neurologic: Alert and oriented x 3. LUE weakness and swelling Psych: Normal affect. Extremities: No clubbing or cyanosis.   TELEMETRY: Reviewed telemetry pt in atrial fibrillation with rate in 90s  ASSESSMENT AND PLAN: 80 yo with history of chronic atrial fibrillation, COPD, and diastolic CHF admitted with respiratory failure, suspect combination of acute/chronic diastolic CHF and acute exacerbation of COPD.   1. Pulmonary: Suspect acute exacerbation COPD, possible viral bronchitis as instigator.   2. Acute on chronic diastolic CHF: Volume status much improved, down total 40 lbs)\ Continue PO lasix. Creatinine stable. Progressing with  PT/OT 3. Atrial fibrillation with RVR: HR stable, on diltiazem CD. Warfarin restarted.   4. Acute on chronic kidney injury: Creatinine stable, will continue to follow 5. Epistaxis: No bleeding overnight. ENT has been following. INR is still sub-thereapeutic and bridging with enox. INR 1.3. Would like to try NOAC for patient, however he can't afford so will continue to increase coumadin and will remain in hospital until therapeutic. He has history of CVA as well as RLE DVT in posterior tibial vein.  Will discuss with ENT about pulling packing.   Overall stable from HF perspective on oral diuretics. Not able to afford NOAC for acute DVT so we will continue with coumadin load/enoxaparin bridge. Would stop enoxaparin when INR 1.8 or greater given recent severe epistaxis. Main issure remains that of disposition. I have spoken with his insurance company and looks like he will get approval.   Truman Haywardaniel Bensimhon,MD 4:05 PM

## 2013-12-20 NOTE — H&P (View-Only) (Signed)
Physical Medicine and Rehabilitation Admission H&P    CC: Deconditioning due to multiple medical issues.   HPI: [name] [name] is a 80 y.o. male with history of COPD--O2 at nights, h/o CVA with L-HP and memory deficits, chronic CHF, A fib with RVR as well as recent admission for ischemic RLE requiring R- femoral embolectomy due to embolic event. [name] on 12/10/13 with worsening of dyspnea, anasarca, acute renal failure and left foot pain. He was treated for acute on chronic renal failure with IV diuresis and started on antibiotics for AECOPD v/s viral bronchitis. Required BIPAP due to mucous plugging. Treated with nebs and steroids and respiratory status improving. He developed acute epistaxis on 01/02/124 treated with chemical cautery and nasal packing by Dr. Wilburn Cornelia with recommendations for continued antibiotic coverage.  Follow up doppler with thrombus right posterior tibial vein and no DVT LLE. Coumadin resumed 12/18/13 --he has had intermittent bleeding early am today. Nasal packing remains in place.   Patient noted to be deconditioned and CIR recommended by therapy team. Patient admitted today.   Review of Systems  HENT: Negative for hearing loss.   Respiratory: Positive for cough. Negative for shortness of breath and wheezing.   Cardiovascular: Negative for chest pain and palpitations.  Gastrointestinal: Negative for heartburn.  Genitourinary: Positive for urgency and frequency.  Musculoskeletal: Negative for back pain.  Neurological: Positive for focal weakness. Negative for headaches.  Psychiatric/Behavioral: Positive for memory loss. The patient does not have insomnia.      Past Medical History  Diagnosis Date  . Hypertension   . Stroke   . COPD (chronic obstructive pulmonary disease)   . Diabetes mellitus without complication   . GERD (gastroesophageal reflux disease)   . A-fib     Past Surgical History  Procedure Laterality Date  . Carotid stent    . Parotid  gland tumor excision    . Embolectomy Right 11/23/2013    Procedure: Right Femoral EMBOLECTOMY;  Surgeon: Angelia Mould, MD;  Location: [facility] OR;  Service: Vascular;  Laterality: Right;  Right Femoral Embolectomy with Bovine Paricardium patch angioplasty.    History reviewed. No pertinent family history.   Social History: Married. Used to work in a [employer]. Independent prior to December admission. He reports that he has quit smoking 17 years ago. He does not have any smokeless tobacco history on file. He reports that he does not drink alcohol or use illicit drugs.   Allergies: No Known Allergies   Medications Prior to Admission  Medication Sig Dispense Refill  . atorvastatin (LIPITOR) 10 MG tablet Take 10 mg by mouth daily.      . benazepril (LOTENSIN) 10 MG tablet Take 10 mg by mouth 2 (two) times daily.      . furosemide (LASIX) 40 MG tablet Take 40-60 mg by mouth 2 (two) times daily. Takes 35m in morning and 459min evening      . glipiZIDE (GLUCOTROL XL) 10 MG 24 hr tablet Take 10 mg by mouth 2 (two) times daily.      . Marland Kitchenevalbuterol (XOPENEX) 0.63 MG/3ML nebulizer solution Take 3 mLs (0.63 mg total) by nebulization every 6 (six) hours as needed for wheezing or shortness of breath.      . metoprolol (LOPRESSOR) 50 MG tablet Take 50 mg by mouth 2 (two) times daily.      . potassium chloride SA (K-DUR,KLOR-CON) 20 MEQ tablet Take 20 mEq by mouth daily.      . Marland Kitchenerazosin (HYTRIN) 5  MG capsule Take 5 mg by mouth every other day.        Home: Home Living Family/patient expects to be discharged to:: Skilled nursing facility Living Arrangements: Spouse/significant other   Functional History: Prior Function Comments: Pt reports that wife provided assist for BADLs for some time.  He has not been ambulatory since prior to previous admission.  Prior to discharge to SNF 12/23, he was pivoting to chair with max A, and he reports that SNF staff used a lift to transfer him to w/c only    Functional Status:  Mobility: Bed Mobility Bed Mobility: Left Sidelying to Sit;Rolling Left;Sitting - Scoot to Edge of Bed Rolling Left: 4: Min assist;With rail Left Sidelying to Sit: 3: Mod assist;HOB flat;With rails Supine to Sit: 3: Mod assist;HOB elevated;With rails Sitting - Scoot to Edge of Bed: 5: Supervision Sitting - Scoot to Edge of Bed: Patient Percentage: 40% Sit to Supine: 2: Max assist;HOB flat Scooting to HOB: 1: +1 Total assist Transfers Transfers: Stand Pivot Transfers;Sit to Stand;Stand to Sit Sit to Stand: From bed;From chair/3-in-1;3: Mod assist;With armrests Sit to Stand: Patient Percentage: 70% Stand to Sit: 4: Min assist Stand to Sit: Patient Percentage: 30% Stand Pivot Transfers: 1: +2 Total assist Stand Pivot Transfers: Patient Percentage: 40% Ambulation/Gait Ambulation/Gait Assistance: 3: Mod assist (+1 to follow with chair) Ambulation Distance (Feet): 10 Feet Assistive device: Rolling walker Ambulation/Gait Assistance Details: assist to direct RW with cueing for posture and position in RW, pt with partial knee buckling left and foot rolling at times due to lack of [name] with need for chair to be following closely to prevent fall. Attempted gait a second time but pt unable to take steps due to instability of Left knee with fatigue Gait Pattern: Step-through pattern;Shuffle;Decreased stride length;Trunk flexed Gait velocity: decreased Stairs: No    ADL: ADL Eating/Feeding: Minimal assistance Where Assessed - Eating/Feeding: Bed level Grooming: Wash/dry face;Wash/dry hands;Minimal assistance Where Assessed - Grooming: Supported sitting Upper Body Bathing: +1 Total assistance Where Assessed - Upper Body Bathing: Supported sitting Lower Body Bathing: +1 Total assistance Where Assessed - Lower Body Bathing: Supine, head of bed up;Rolling right and/or left Upper Body Dressing: +1 Total assistance Where Assessed - Upper Body Dressing: Supported  sitting Lower Body Dressing: +1 Total assistance Where Assessed - Lower Body Dressing: Supine, head of bed up;Supine, head of bed flat;Rolling right and/or left Toilet Transfer: +1 Total assistance (unable ) Transfers/Ambulation Related to ADLs: Pt unable to move sit to stand with total A +1 ADL Comments: Pt limited by dyspnea and fatigue as well as weakness  Cognition: Cognition Overall Cognitive Status: Within Functional Limits for tasks assessed Orientation Level: Oriented X4 Cognition Arousal/Alertness: Awake/alert Behavior During Therapy: WFL for tasks assessed/performed Overall Cognitive Status: Within Functional Limits for tasks assessed Memory: Decreased short-term memory  Physical Exam: Blood pressure 128/54, pulse 76, temperature 97.9 F (36.6 C), temperature source Oral, resp. rate 20, height 6' (1.829 m), weight 79.6 kg (175 lb 7.8 oz), SpO2 96.00%. Physical Exam  Nursing note and vitals reviewed. Constitutional: He is oriented to person, place, and time. He appears well-developed and well-nourished.     HENT:  Head: Normocephalic and atraumatic.  Packing in right nares with contracted edges and fresh blood at edges, in no distress. Edentulous  Eyes: Conjunctivae are normal.  Dysconjugate gaze with left exophthalmus.   Neck: Normal range of motion. Neck supple. No JVD present. No tracheal deviation present. No thyromegaly present.  Cardiovascular: Normal rate.  SEM   Respiratory: Effort normal and breath sounds normal. No respiratory distress. He has no wheezes.  Rhonchi, few other upper airway sounds  GI: Soft. Bowel sounds are normal. He exhibits no distension. There is no tenderness. There is no rebound.  Musculoskeletal: He exhibits no edema.  Lymphadenopathy:    He has no cervical adenopathy.  Neurological: He is alert and oriented to person, place, and time.  Reasonable insight and awareness. Attention good. Slightly HOH. LUE is 3- delt, 3 to 3+ biceps,  tricep and 3+ HI.  LLE is 2+/5 HF, 3- KE, ADF and APF were 1+ to 2.  Sensation appears slightly reduced to PP and LT throughout the left. RUE is 4 to 4+/5. RLE is 3 to 3+ HF to 4/5 distally. Mild left central 7 seen. Speech slightly dysarthric.    Skin: Skin is warm. He is diaphoretic.  Numerous ecchymoses. A few lacs/dressed  Psychiatric: He has a normal mood and affect. His behavior is normal. Thought content normal.    Results for orders placed during the hospital encounter of 12/10/13 (from the past 48 hour(s))  GLUCOSE, CAPILLARY     Status: Abnormal   Collection Time    12/18/13 10:53 AM      Result Value Range   Glucose-Capillary 176 (*) 70 - 99 mg/dL   Comment 1 Notify RN    GLUCOSE, CAPILLARY     Status: Abnormal   Collection Time    12/18/13  4:30 PM      Result Value Range   Glucose-Capillary 253 (*) 70 - 99 mg/dL   Comment 1 Notify RN    GLUCOSE, CAPILLARY     Status: Abnormal   Collection Time    12/18/13  9:49 PM      Result Value Range   Glucose-Capillary 265 (*) 70 - 99 mg/dL   Comment 1 Notify RN     Comment 2 Documented in Chart    PROTIME-INR     Status: Abnormal   Collection Time    12/19/13  4:15 AM      Result Value Range   Prothrombin Time 16.0 (*) 11.6 - 15.2 seconds   INR 1.31  0.00 - 2.45  BASIC METABOLIC PANEL     Status: Abnormal   Collection Time    12/19/13  4:15 AM      Result Value Range   Sodium 133 (*) 137 - 147 mEq/L   Potassium 4.3  3.7 - 5.3 mEq/L   Chloride 90 (*) 96 - 112 mEq/L   CO2 32  19 - 32 mEq/L   Glucose, Bld 239 (*) 70 - 99 mg/dL   BUN 38 (*) 6 - 23 mg/dL   Creatinine, Ser 1.22  0.50 - 1.35 mg/dL   Calcium 9.9  8.4 - 10.5 mg/dL   GFR calc non Af Amer 54 (*) >90 mL/min   GFR calc Af Amer 63 (*) >90 mL/min   Comment: (NOTE)     The eGFR has been calculated using the CKD EPI equation.     This calculation has not been validated in all clinical situations.     eGFR's persistently <90 mL/min signify possible Chronic Kidney      Disease.  CBC     Status: Abnormal   Collection Time    12/19/13  4:15 AM      Result Value Range   WBC 12.2 (*) 4.0 - 10.5 K/uL   RBC 4.11 (*) 4.22 - 5.81 MIL/uL  Hemoglobin 12.4 (*) 13.0 - 17.0 g/dL   HCT 37.7 (*) 39.0 - 52.0 %   MCV 91.7  78.0 - 100.0 fL   MCH 30.2  26.0 - 34.0 pg   MCHC 32.9  30.0 - 36.0 g/dL   RDW 14.3  11.5 - 15.5 %   Platelets 202  150 - 400 K/uL  GLUCOSE, CAPILLARY     Status: Abnormal   Collection Time    12/19/13  6:54 AM      Result Value Range   Glucose-Capillary 205 (*) 70 - 99 mg/dL  GLUCOSE, CAPILLARY     Status: Abnormal   Collection Time    12/19/13 11:10 AM      Result Value Range   Glucose-Capillary 266 (*) 70 - 99 mg/dL   Comment 1 Documented in Chart    GLUCOSE, CAPILLARY     Status: Abnormal   Collection Time    12/19/13  4:31 PM      Result Value Range   Glucose-Capillary 240 (*) 70 - 99 mg/dL   Comment 1 Notify RN    GLUCOSE, CAPILLARY     Status: Abnormal   Collection Time    12/19/13  9:31 PM      Result Value Range   Glucose-Capillary 352 (*) 70 - 99 mg/dL   Comment 1 Notify RN    PROTIME-INR     Status: Abnormal   Collection Time    12/20/13  3:15 AM      Result Value Range   Prothrombin Time 16.1 (*) 11.6 - 15.2 seconds   INR 1.32  0.00 - 0.38  BASIC METABOLIC PANEL     Status: Abnormal   Collection Time    12/20/13  3:15 AM      Result Value Range   Sodium 132 (*) 137 - 147 mEq/L   Potassium 4.5  3.7 - 5.3 mEq/L   Chloride 88 (*) 96 - 112 mEq/L   CO2 28  19 - 32 mEq/L   Glucose, Bld 306 (*) 70 - 99 mg/dL   BUN 40 (*) 6 - 23 mg/dL   Creatinine, Ser 1.30  0.50 - 1.35 mg/dL   Calcium 10.3  8.4 - 10.5 mg/dL   GFR calc non Af Amer 50 (*) >90 mL/min   GFR calc Af Amer 58 (*) >90 mL/min   Comment: (NOTE)     The eGFR has been calculated using the CKD EPI equation.     This calculation has not been validated in all clinical situations.     eGFR's persistently <90 mL/min signify possible Chronic Kidney     Disease.   GLUCOSE, CAPILLARY     Status: Abnormal   Collection Time    12/20/13  6:08 AM      Result Value Range   Glucose-Capillary 241 (*) 70 - 99 mg/dL   Comment 1 Notify RN     No results found.  Post Admission Physician Evaluation: 1. Functional deficits secondary  to deconditioning after acute renal failure, respiratory failure in the setting of premorbid left-sided weakness from prior CVA . 2. Patient is admitted to receive collaborative, interdisciplinary care between the physiatrist, rehab nursing staff, and therapy team. 3. Patient's level of medical complexity and substantial therapy needs in context of that medical necessity cannot be provided at a lesser intensity of care such as a SNF. 4. Patient has experienced substantial functional loss from his/her baseline which was documented above under the "Functional History" and "Functional Status" headings.  Judging by the patient's diagnosis, physical exam, and functional history, the patient has potential for functional progress which will result in measurable gains while on inpatient rehab.  These gains will be of substantial and practical use upon discharge  in facilitating mobility and self-care at the household level. 5. Physiatrist will provide 24 hour management of medical needs as well as oversight of the therapy plan/treatment and provide guidance as appropriate regarding the interaction of the two. 6. 24 hour rehab nursing will assist with bladder management, bowel management, safety, skin/wound care, disease management, medication administration, pain management and patient education  and help integrate therapy concepts, techniques,education, etc. 7. PT will assess and treat for/with: Lower extremity strength, range of motion, stamina, balance, functional mobility, safety, adaptive techniques and equipment, orthotic assessment, family education, pain mgt, cardio-pulmonary stamina.   Goals are: supervision. 8. OT will assess and treat  for/with: ADL's, functional mobility, safety, upper extremity strength, adaptive techniques and equipment, NMR, cardiopulmonary stamina, family ed.   Goals are: supervision to min assist. 9. SLP will assess and treat for/with: n/a.  Goals are: n/a. 10. Case Management and Social Worker will assess and treat for psychological issues and discharge planning. 11. Team conference will be held weekly to assess progress toward goals and to determine barriers to discharge. 12. Patient will receive at least 3 hours of therapy per day at least 5 days per week. 13. ELOS: 15-20 days       14. Prognosis:  excellent   Medical Problem List and Plan: Deconditioning related acute renal failure, hypoxia, and multiple medical issues  1. RLE DVT/A fib/Anticoagulation: Pharmaceutical: Coumadin and Lovenox 2. Pain Management: prn tylenol  3. Mood:  Will have LCSW follow for evaluation.  4. Neuropsych: This patient is capable of making decisions on his own behalf. 5. Epistaxis: Will monitor for now--may have to discuss risks v/s benefits if bleeding worsens/recurs as INR rises to therapeutic levels.  Nasal packing remains in place.  Antibiotic coverage while packing in place.  6. Question multilobar PNA:  Will check follow CXR. Antibiotic D# 11. Has persistent cough 7. AECOPD:  Respiratory status improving and slow taper of steroids.   Continue nebs and IS.  8.  Acute on chronic CHF: Compensated--continue low salt diet with 1500 cc fluid restriction. On lasix 80 mg bid. Check daily weights. Daily monitoring of I's and O's.  9.  A fib: monitor HR with bid checks. Continue diltiazem 240 mg daily.  10.  GU: Foley d/c yesterday. Has frequency and urgency due to diuretics. . Toilet patient as unable to use urinal and needs to stand to empty.  Condom cath at nights. Will check PVRs   Meredith Staggers, MD, [place] [facility]   12/20/2013

## 2013-12-20 NOTE — Progress Notes (Signed)
ANTICOAGULATION CONSULT NOTE - Follow-up  Pharmacy Consult for warfarin Indication: atrial fibrillation / new RLE DVT 12/17/13  No Known Allergies  Patient Measurements: Height: 6' (182.9 cm) Weight: 175 lb 7.8 oz (79.6 kg) IBW/kg (Calculated) : 77.6  Vital Signs: Temp: 97.9 F (36.6 C) (01/07 0444) Temp src: Oral (01/07 0444) BP: 116/53 mmHg (01/07 1138) Pulse Rate: 96 (01/07 1138)  Labs:  Recent Labs  12/18/13 0642 12/19/13 0415 12/20/13 0315  HGB 11.6* 12.4*  --   HCT 34.8* 37.7*  --   PLT 212 202  --   LABPROT 18.0* 16.0* 16.1*  INR 1.53* 1.31 1.32  CREATININE 1.30 1.22 1.30    Estimated Creatinine Clearance: 49.7 ml/min (by C-G formula based on Cr of 1.3).  Assessment: 82 YOM admitted with PNA treated with ABX, HFpEF with volume overload and Afib with CVR on diltiazem.  He was continued on warfarin for afib but developed epistaxis - his nose was packed by ENT and warfarin was held. Epistaxis resolved, INR 1.53 and new RLE DVT found 1/4.  Decision was made to restart anticoagulation and monitior closely.    Warfarin PTA 2.5mg  daily - previously on amio - stopped day of last d/c 12/05/13  1/6 INR remains subtherapeutic at 1.31.  No epistaxis noted today.  1/7 INR 1.32.  Per patient report had a little epistaxis this morning.  Goal of Therapy:  INR 2-3 Monitor platelets by anticoagulation protocol: Yes  Plan:  Coumadin 7.5 mg po x 1 tonight. Continue Lovenox 1mg /kg = 80mg  q12hr until INR > 2 and 5 days overlap for VTE Daily Protime Monitor s/s bleeding  Tad MooreJessica Nycholas Rayner, Pharm D, BCPS  Clinical Pharmacist Pager [phone]  12/20/2013 12:45 PM

## 2013-12-20 NOTE — Interval H&P Note (Signed)
Elie GoodyBobby Dunsmore was admitted today to Inpatient Rehabilitation with the diagnosis of deconditioning after acute renal failure, respiratory failure, and multiple medical issues.  The patient's history has been reviewed, patient examined, and there is no change in status.  Patient continues to be appropriate for intensive inpatient rehabilitation.  I have reviewed the patient's chart and labs.  Questions were answered to the patient's satisfaction.  Morgen Ritacco T 12/20/2013, 8:59 PM

## 2013-12-20 NOTE — Progress Notes (Signed)
ANTICOAGULATION and ANTIBIOTIC CONSULT NOTE - Follow-up  Pharmacy Consult for warfarin and levaquin Indication: atrial fibrillation / new RLE DVT 12/17/13/ COPD exacerbation  No Known Allergies  Patient Measurements: Height: 5\' 7"  (170.2 cm) Weight: 168 lb 14 oz (76.6 kg) IBW/kg (Calculated) : 66.1  Vital Signs: Temp: 97.5 F (36.4 C) (01/07 1918) Temp src: Oral (01/07 1918) BP: 145/87 mmHg (01/07 1918) Pulse Rate: 74 (01/07 1918)  Labs:  Recent Labs  12/18/13 0642 12/19/13 0415 12/20/13 0315  HGB 11.6* 12.4*  --   HCT 34.8* 37.7*  --   PLT 212 202  --   LABPROT 18.0* 16.0* 16.1*  INR 1.53* 1.31 1.32  CREATININE 1.30 1.22 1.30    Estimated Creatinine Clearance: 42.4 ml/min (by C-G formula based on Cr of 1.3).  Assessment: 82 YOM admitted with PNA treated with ABX, HFpEF with volume overload and Afib with CVR on diltiazem.  He was continued on warfarin for afib but developed epistaxis - his nose was packed by ENT and warfarin was held. Epistaxis resolved, INR 1.53 and new RLE DVT found 1/4.  Decision was made to restart anticoagulation and monitior closely.    Warfarin PTA 2.5mg  daily - previously on amio - stopped day of last d/c 12/05/13  1/6 INR remains subtherapeutic at 1.31.  No epistaxis noted today.  1/7 INR 1.32.  Per patient report had a little epistaxis this morning.  Dr. Gala RomneyBensimhon suggesting stopping LMWH when INR 1.8 or greater given recent severe epistaxis in his 12/20/13 chart note.   Also on levaquin day # 9 for HCAP.  Creat 1.3.  Last WBC 12.2 on 1/6. AF.   Goal of Therapy:  INR 2-3 Monitor platelets by anticoagulation protocol: Yes  Plan:  Coumadin 7.5 mg po x 1 tonight- already given Continue Lovenox 1mg /kg = 80mg  q12hr until INR > 2 and 5 days overlap for VTE Daily Protime Monitor s/s bleeding Levaquin 750 mg po daily. ? Stop levaquin soon? Herby AbrahamMichelle T. Lexton Hidalgo, Pharm.D. [phone] 12/20/2013 8:17 PM

## 2013-12-20 NOTE — Discharge Summary (Signed)
Physician Discharge Summary  [name] [name] FAO:[id number] DOB: [birth year] DOA: 12/10/2013  PCP: No PCP Per Patient  Admit date: 12/10/2013 Discharge date: 12/20/2013  Time spent: 30 minutes  Recommendations for Outpatient Follow-up:  1. ENT is to follow the patient to give recommendations as to antibiotic duration, when to remove packing? patient had mild epistaxis despite packing on 12/19/13 -I called their office but still have not heard back 2. Daily weights, basic metabolic panel every other day, INR every one to 2 days 3. Heart failure medications discussed with advanced heart failure clinic Karie Mainland, NP-they will see patient while at inpatient rehabilitation  Discharge Diagnoses:  Principal Problem:   Acute respiratory failure with hypoxia Active Problems:   Atrial fibrillation   H/O: CVA (cerebrovascular accident)   COPD (chronic obstructive pulmonary disease)   Anasarca   Acute on chronic diastolic heart failure   Acute renal failure   Anemia   UTI (urinary tract infection)   Diabetes mellitus with hypoglycemia   Acute on chronic diastolic CHF (congestive heart failure)   Peripheral vascular disease   Acute on chronic diastolic congestive heart failure   DVT (deep venous thrombosis)   Malnutrition of moderate degree   Discharge Condition: Stable  Diet recommendation: Heart healthy low-salt  Filed Weights   12/18/13 0520 12/19/13 0630 12/20/13 0444  Weight: 81.2 kg (179 lb 0.2 oz) 80.377 kg (177 lb 3.2 oz) 79.6 kg (175 lb 7.8 oz)    History of present illness:  80 y.o. readmitted 12/10/14 male with history of CAD, PVD, COPD on O2 at night atrial fibrillation on Coumadin, hypertension, DM 2, former smoker, CVA with residual left hemiparesis, was recently hospitalized at [facility] between 11/23/2013-12/05/2013 for ischemic right leg. During that admission, he underwent a right femoral embolectomy by vascular surgery.  Readmitted worsening respiratory symptoms,  multifactorial etiology Initially placed on BiPAP due to mucous plugging, developed acute epistaxis and treated by cautery/nasal packing antibiotic coverage 5 days On Lovenox to Coumadin bridge, goal INR 1.8 to discontinue Lovenox Advanced heart failure team has been seeing him in managing his medications and he has lost about 40 pounds since his admission   Hospital Course:  Assessment/Plan:  Acute respiratory failure with hypoxia  - multifactorial likely due to COPD exacerbation, acute diastolic CHF exacerbation, HCAP  - management per Cardiology, improving --I discussed all discharge meds with NP for advanced heart failure team - Slow steroid taper, solu-medrol discontinued early in hospital stay-I discontinued prednisone 1/7 - cont nebs and Robittusin DM and flutter valve  - chronically on 2 L of O2 at home  - started on Vanc and Zosyn on 12/28 by PCCM; de-escalate to Levofloxacin 1/2--> needs this probably for one to 2 more days given epistaxis. - repeat CXR 1/4 am with pneumonia. Continue antibiotics.  Epistaxis - noted events 1/2 overnight, appreciate ENT consult.  - patient with intermittent nose bleeds, now resolved. Continue anticoagulation, benefits >> risks -I did call their office today 1/7 but I have received no answer-please confer with them duration of antibiotics as well as when packing can be removed Recent DVT RLE and arterial embolus s/p R femoral embolectomy and angioplasty December 2014  - diagnosed at the beginning of last December; on Coumadin, however with recent Epistaxis.  - DVT US positive, continue anticoagulation at least 3-6 months Atrial fibrillation with RVR  - currently controlled on Amiodarone.  -Will need interval LFTs, TSH in one month - management per Cardiology  AKI on CKD 3 - cont  to follow with diuresis - crt stable, 1.5>1.62>1.54>1.47>1.43  Recent embolic event right LE - s/p embolectomy  Diabetes mellitus - non-insulin requiring - CBG elevated  due to steroids  - sliding scale coverage may still be needed at rehabilitation - taper steroids-see above discussion, discontinued 1/7 Hyperlipidemia  BPH - cont Hytrin  H/O CVA   Consultants:  Cardiology  PCCM  ENT Procedures:  none Antibiotics 12/28 vanc >> 1/2  12/28 zosyn >> 1/2  1/2 Levofloxacin > ?? 12/23/13   Discharge Exam: Filed Vitals:   12/20/13 1437  BP: 118/55  Pulse: 90  Temp: 97.6 F (36.4 C)  Resp: 20   doing fair sleepy but arousable at bedside  General: EOMI Cardiovascular: S1-S2 normal rub or gallop irregular Respiratory: Clinically clear  Discharge Instructions  Discharge Orders   Future Appointments Provider Department Dept Phone   12/21/2013 7:30 AM Dillard EssexSarah M Hoxie, OT [facility] [phone]   12/21/2013 9:30 AM Alma Friendlyebecca M Windsor, PT [facility] [phone]   12/21/2013 1:00 PM Alma FriendlyRebecca M Windsor, PT [facility] [phone] [phone]   12/21/2013 2:00 PM Dillard EssexSarah M Hoxie, OT [facility] [phone]   01/03/2014 9:15 AM Chuck Hinthristopher S Dickson, MD [facility] [phone]   Future Orders Complete By Expires   Diet - low sodium heart healthy  As directed    Discharge instructions  As directed    Comments:     Needs further care at CIR Will need INR q 2-3 daily Await call back from ENT regarding anticoagulation as well as Nasal bleed and packing Needs CBC + Cmet in 1-2 days + INR   Increase activity slowly  As directed        Medication List    STOP taking these medications       benazepril 10 MG tablet  Commonly known as:  LOTENSIN     metoprolol 50 MG tablet  Commonly known as:  LOPRESSOR      TAKE these medications       atorvastatin 10 MG tablet  Commonly known as:  LIPITOR  Take 10 mg by mouth daily.     diltiazem 240 MG 24 hr capsule  Commonly known as:  CARDIZEM CD  Take 1  capsule (240 mg total) by mouth daily.     enoxaparin 80 MG/0.8ML injection  Commonly known as:  LOVENOX  Inject 0.8 mLs (80 mg total) into the skin every 12 (twelve) hours.     furosemide 80 MG tablet  Commonly known as:  LASIX  Take 1 tablet (80 mg total) by mouth 2 (two) times daily.     glipiZIDE 10 MG 24 hr tablet  Commonly known as:  GLUCOTROL XL  Take 10 mg by mouth 2 (two) times daily.     levalbuterol 0.63 MG/3ML nebulizer solution  Commonly known as:  XOPENEX  Take 3 mLs (0.63 mg total) by nebulization every 6 (six) hours as needed for wheezing or shortness of breath.     oxymetazoline 0.05 % nasal spray  Commonly known as:  AFRIN  Place 1 spray into both nostrils 2 (two) times daily.     oxymetazoline 0.05 % nasal spray  Commonly known as:  AFRIN  Place 2 sprays into both nostrils every 6 (six) hours as needed for congestion.     potassium chloride SA 20 MEQ tablet  Commonly  known as:  K-DUR,KLOR-CON  Take 20 mEq by mouth daily.     terazosin 5 MG capsule  Commonly known as:  HYTRIN  Take 5 mg by mouth every other day.     warfarin 7.5 MG tablet  Commonly known as:  COUMADIN  Take 1 tablet (7.5 mg total) by mouth one time only at 6 PM.       No Known Allergies    The results of significant diagnostics from this hospitalization (including imaging, microbiology, ancillary and laboratory) are listed below for reference.    Significant Diagnostic Studies: Dg Chest 2 View  12/17/2013   CLINICAL DATA:  Evaluate for pneumonia.  Status post diuresis.  EXAM: CHEST  2 VIEW  COMPARISON:  Chest x-ray 12/11/2013.  FINDINGS: Patchy areas of peribronchial cuffing and patchy multifocal asymmetrically distributed airspace opacities, most severe in the mid to lower lungs bilaterally (right greater than left), concerning for multilobar bronchopneumonia. No evidence of pulmonary edema. Heart size is mildly enlarged. Upper mediastinal contours are within normal limits.  Atherosclerosis in the thoracic aorta.  IMPRESSION: 1. Findings, as above, concerning for multilobar bronchopneumonia. 2. Mild cardiomegaly. 3. Atherosclerosis.   Electronically Signed   By: Trudie Reed M.D.   On: 12/17/2013 12:34   Ct Head Wo Contrast  12/11/2013   CLINICAL DATA:  Acute mental status change.  EXAM: CT HEAD WITHOUT CONTRAST  TECHNIQUE: Contiguous axial images were obtained from the base of the skull through the vertex without intravenous contrast.  COMPARISON:  MRI brain 11/22/2013.  CT head 11/22/2013.  FINDINGS: Diffuse cerebral atrophy. Ventricular dilatation likely represent central atrophy. Patchy low-attenuation changes in the deep white matter consistent with small vessel ischemia. No mass effect or midline shift. No abnormal extra-axial fluid collections. Gray-white matter junctions are distinct. Basal cisterns are not effaced. No evidence of acute intracranial hemorrhage. No depressed skull fractures. Visualized paranasal sinuses and mastoid air cells are not opacified. Vascular calcifications. No significant interval change.  IMPRESSION: No acute intracranial abnormalities. Stable appearance of chronic atrophy and small vessel ischemic change.   Electronically Signed   By: Burman Nieves M.D.   On: 12/11/2013 23:30   Dg Chest Port 1 View  12/11/2013   CLINICAL DATA:  Shortness of breath and respiratory distress.  EXAM: PORTABLE CHEST - 1 VIEW  COMPARISON:  12/10/2013  FINDINGS: Cardiac enlargement with pulmonary vascular congestion. Infiltrates in the lung bases suggest early edema. No pleural effusion or pneumothorax suggested. Degenerative changes in the shoulders. Probably no significant change since previous study, allowing for differences in technique.  IMPRESSION: Cardiac enlargement with mild pulmonary vascular congestion and suggestion of basilar edema.   Electronically Signed   By: Burman Nieves M.D.   On: 12/11/2013 01:10   Dg Chest Port 1 View  12/01/2013    CLINICAL DATA:  Pulmonary edema.  EXAM: PORTABLE CHEST - 1 VIEW  COMPARISON:  11/29/2013.  FINDINGS: Left IJ line in stable position. Right carotid stent noted. Persistent prominent cardiomegaly is present. Pulmonary venous congestion and bilateral interstitial and alveolar infiltrates are noted consistent with persistent congestive heart failure and pulmonary edema. Superimposed pneumonia in the right lung base cannot be excluded. Small right pleural effusion cannot be excluded. No pneumothorax. No acute osseous abnormality.  IMPRESSION: Persistent unchanged congestive heart failure with pulmonary edema.   Electronically Signed   By: Maisie Fus  Register   On: 12/01/2013 07:40   Dg Chest Port 1 View  11/29/2013   CLINICAL DATA:  Assess edema.  EXAM: PORTABLE CHEST - 1 VIEW  COMPARISON:  11/28/2013  FINDINGS: Left jugular central line is near the junction of the superior vena cava and left innominate vein. Interstitial pulmonary edema has slightly improved. There continues to be increased densities at the right lung base. Heart size is mildly enlarged. Vascular stent on the right side of the neck.  IMPRESSION: Interstitial edema has minimally improved. There continues to be increased densities at the right lung base.   Electronically Signed   By: Richarda Overlie M.D.   On: 11/29/2013 07:41   Dg Chest Port 1 View  11/28/2013   CLINICAL DATA:  Shortness of breath.  EXAM: PORTABLE CHEST - 1 VIEW  COMPARISON:  11/27/2013.  FINDINGS: Left IJ catheter in good anatomic position. Right carotid stent. Cardiomegaly with pulmonary venous congestion and prominent bilateral pulmonary interstitial and alveolar infiltrates. Small right pleural effusion may be present. These findings are consistent with congestive heart failure with pulmonary edema. Superimposed pneumonia cannot be excluded. No pneumothorax. Degenerative changes both shoulders.  IMPRESSION: 1. Congestive heart failure with bilateral pulmonary edema. Small right  pleural effusion may be present. Superimposed pneumonia cannot be excluded. 2. Left IJ catheter in stable position.  Right carotid stent.   Electronically Signed   By: Maisie Fus  Register   On: 11/28/2013 07:24   Dg Chest Port 1 View  11/27/2013   CLINICAL DATA:  Line placement  EXAM: PORTABLE CHEST - 1 VIEW  COMPARISON:  11/25/2013; 11/24/2013; 11/22/2013; 11/25/2010  FINDINGS: Grossly unchanged enlarged cardiac silhouette and mediastinal contours. Interval placement of left jugular approach central venous catheter with tip projected over the superior/ mid SVC. No pneumothorax. Bibasilar heterogeneous slightly nodular opacities are grossly unchanged, right greater than left. The pulmonary vasculature appears slightly indistinct, in particular within the right upper and mid lung. No definite pleural effusion. Grossly unchanged bones.  IMPRESSION: 1. Interval placement of left jugular approach under venous catheter with tip projected over to the superior/mid SVC. No pneumothorax. 2. Persistent findings of bibasilar opacities, right greater than left, atelectasis versus infiltrate. 3. Cardiomegaly and possible mild asymmetric pulmonary edema, right greater than left.   Electronically Signed   By: Simonne Come M.D.   On: 11/27/2013 14:03   Dg Chest Port 1 View  11/25/2013   CLINICAL DATA:  Respiratory failure  EXAM: PORTABLE CHEST - 1 VIEW  COMPARISON:  11/24/2013  FINDINGS: Cardiac shadow is stable. The endotracheal tube and nasogastric catheter been removed in the interval. Bibasilar atelectatic changes are noted. No focal confluent infiltrate is seen. No acute bony abnormality is noted.  IMPRESSION: Bibasilar atelectatic changes   Electronically Signed   By: Alcide Clever M.D.   On: 11/25/2013 07:29   Portable Chest Xray  11/24/2013   CLINICAL DATA:  Endotracheal tube placement.  EXAM: PORTABLE CHEST - 1 VIEW  COMPARISON:  Chest radiograph performed 11/23/2013  FINDINGS: The patient's endotracheal tube is  seen ending 4 cm above the carina. Enteric tube is noted extending below the diaphragm.  Lung expansion is mildly decreased. Vascular congestion is noted, with bilateral central airspace opacities, raising concern for mild pulmonary edema. No definite pleural effusion or pneumothorax is seen.  The cardiomediastinal silhouette is mildly enlarged. Calcification is noted within the aortic arch. No acute osseous abnormalities are identified.  IMPRESSION: 1. Endotracheal tube seen ending 4 cm above the carina. 2. Vascular congestion and mild cardiomegaly, with bilateral central airspace opacities, raising concern for mild pulmonary edema. Previously noted right basilar opacity has improved.  Electronically Signed   By: Roanna Raider M.D.   On: 11/24/2013 02:34   Dg Chest Portable 1 View  11/23/2013   CLINICAL DATA:  Shortness of breath; preoperative chest radiograph.  EXAM: PORTABLE CHEST - 1 VIEW  COMPARISON:  Chest radiograph performed 11/22/2013  FINDINGS: The lungs are well-aerated. Mild bibasilar airspace opacities are slightly worsened from the prior study and may reflect atelectasis, mild pneumonia or possibly mild pulmonary edema, given underlying vascular congestion. No pleural effusion or pneumothorax is seen.  The cardiomediastinal silhouette is mildly enlarged. No acute osseous abnormalities are seen.  IMPRESSION: Mild bibasilar airspace opacities are slightly worsened from the recent prior study and may reflect atelectasis, mild pneumonia or possibly mild pulmonary edema, given underlying vascular congestion and mild cardiomegaly.   Electronically Signed   By: Roanna Raider M.D.   On: 11/23/2013 21:51   Dg Abd Portable 1v  11/30/2013   CLINICAL DATA:  Abdominal distention.  Diabetes.  EXAM: PORTABLE ABDOMEN - 1 VIEW  COMPARISON:  Lumbar spine series 10 19 2009.  FINDINGS: Moderate gastric distention noted. Paucity of gas is noted distally. Gastric outlet obstruction cannot be excluded. Calcific  density noted over the left kidney suggesting left nephrolithiasis. Degenerative changes lumbar spine and both hips.  IMPRESSION: Cannot exclude gastric outlet obstruction. These results will be called to the ordering clinician or representative by the Radiologist Assistant, and communication documented in the PACS Dashboard.   Electronically Signed   By: Maisie Fus  Register   On: 11/30/2013 12:11    Microbiology: Recent Results (from the past 240 hour(s))  URINE CULTURE     Status: None   Collection Time    12/10/13  6:02 PM      Result Value Range Status   Specimen Description URINE, CATHETERIZED   Final   Special Requests NONE   Final   Culture  Setup Time     Final   Value: 12/11/2013 01:10     Performed at [facility] Count     Final   Value: 50,000 COLONIES/ML     Performed at [facility]   Culture     Final   Value: ESCHERICHIA COLI     Performed at [facility]   Report Status 12/13/2013 FINAL   Final   Organism ID, Bacteria ESCHERICHIA COLI   Final     Labs: Basic Metabolic Panel:  Recent Labs Lab 12/16/13 0410 12/17/13 0430 12/18/13 0642 12/19/13 0415 12/20/13 0315  NA 138 139 136* 133* 132*  K 4.5 4.6 4.5 4.3 4.5  CL 96 95* 91* 90* 88*  CO2 26 33* 29 32 28  GLUCOSE 268* 248* 234* 239* 306*  BUN 46* 42* 36* 38* 40*  CREATININE 1.47* 1.43* 1.30 1.22 1.30  CALCIUM 8.8 9.1 9.5 9.9 10.3   Liver Function Tests: No results found for this basename: AST, ALT, ALKPHOS, BILITOT, PROT, ALBUMIN,  in the last 168 hours No results found for this basename: LIPASE, AMYLASE,  in the last 168 hours No results found for this basename: AMMONIA,  in the last 168 hours CBC:  Recent Labs Lab 12/14/13 0530 12/16/13 0410 12/18/13 0642 12/19/13 0415  WBC 7.3 12.2* 12.1* 12.2*  HGB 11.0* 11.5* 11.6* 12.4*  HCT 33.9* 35.8* 34.8* 37.7*  MCV 93.6 93.7 91.3 91.7  PLT 220 233 212 202   Cardiac Enzymes: No results found for this basename:  CKTOTAL, CKMB, CKMBINDEX, TROPONINI,  in the last 168 hours BNP:  BNP (last 3 results)  Recent Labs  12/10/13 1630  PROBNP 5532.0*   CBG:  Recent Labs Lab 12/19/13 1110 12/19/13 1631 12/19/13 2131 12/20/13 0608 12/20/13 1121  GLUCAP 266* 240* 352* 241* 217*       Signed:  Rhetta Mura  [facility] 12/20/2013, 3:51 PM

## 2013-12-20 NOTE — Progress Notes (Signed)
Patient successfully transferred to receiving unit.

## 2013-12-20 NOTE — Progress Notes (Signed)
DC Tele, DC patient to in patient rehab. Report given. Discharge instructions included in patient's medical record. Patient's medical records sent with patient, along with patient's belongings and medications. Patient leaving unit via wheelchair and appears in no acute distress.

## 2013-12-20 NOTE — Progress Notes (Signed)
Patient admitted at 1800 from 4 E 13. Patient alert and oriented x4 . No complaint of pain . Oriented patient to room and call bell system . Patient verbalized understanding of rehab process. Continue with plan of care .              Cleotilde NeerJoyce, Elizabeth Haff S

## 2013-12-20 NOTE — Progress Notes (Signed)
Occupational Therapy Treatment Patient Details Name: [name] GoodyBobby Lukens MRN: [id number] DOB: August 04, 1931 Today's Date: 12/20/2013 Time: [id number] OT Time Calculation (min): 10 min  OT Assessment / Plan / Recommendation  History of present illness 80 y/o male with diastolic CHF, HTN COPD, NIRDM with A-fib with recent embolic event to right leg s/p right femoral embolectomy.  He was discharged to SNF on 12/05/12.  He was readmitted on 12/28 with acute respiratory failure suspected to be due to a COPD exacerbation and decompensated CHF and admitted by the hospitalist service and transferred to the ICU on 12/29 Adelise Buswell AM for respiratory distress possibly due to mucous plugging. Required BiPAP   OT comments  Patient would benefit from CIR to maximize functional independence and to facilitate safe discharge home with his wife.  Follow Up Recommendations  CIR;SNF (if CIR doesn't accept)   Barriers to Discharge       Equipment Recommendations  None recommended by OT    Recommendations for Other Services    Frequency Min 2X/week   Progress towards OT Goals Progress towards OT goals: Progressing toward goals  Plan Discharge plan remains appropriate    Precautions / Restrictions Precautions Precautions: Fall Precaution Comments: monitor O2 sats Required Braces or Orthoses: Other Brace/Splint Other Brace/Splint: pt normally uses Lt double-upright [name] (currently at home-pt's spouse brought it then returned it home) Restrictions Weight Bearing Restrictions: No Other Position/Activity Restrictions: asked wife to bring [name] next time she comes   Pertinent Vitals/Pain No c/o pain    ADL  Eating/Feeding: Performed;Minimal assistance Where Assessed - Eating/Feeding: Chair Transfers/Ambulation Related to ADLs: not addressed during this tx as pt had just received lunch tray and was already up in recliner. ADL Comments: Patient reports recently finished with PT and fatigued. Lunch tray delivered. Patient  able to feed self with full setup and min A. Some spillage noted. Pt reports increased urinary frequency and urgency and often has incontinence because cannot get urinal in place in time.    OT Diagnosis:    OT Problem List:   OT Treatment Interventions:     OT Goals(current goals can now be found in the care plan section) Acute Rehab OT Goals Patient Stated Goal: to get better  Visit Information  Last OT Received On: 12/20/13 Assistance Needed: +2 PT goals addressed during session: Mobility/safety with mobility;Proper use of DME;Strengthening/ROM;Balance OT goals addressed during session: ADL's and self-care History of Present Illness: 80 y/o male with diastolic CHF, HTN COPD, NIRDM with A-fib with recent embolic event to right leg s/p right femoral embolectomy.  He was discharged to SNF on 12/05/12.  He was readmitted on 12/28 with acute respiratory failure suspected to be due to a COPD exacerbation and decompensated CHF and admitted by the hospitalist service and transferred to the ICU on 12/29 Stonewall Doss AM for respiratory distress possibly due to mucous plugging. Required BiPAP    Subjective Data      Prior Functioning       Cognition  Cognition Arousal/Alertness: Awake/alert Behavior During Therapy: WFL for tasks assessed/performed Overall Cognitive Status: History of cognitive impairments - at baseline Memory: Decreased short-term memory    Mobility       Exercises     Balance     End of Session OT - End of Session Activity Tolerance: Patient tolerated treatment well Patient left: in chair;with call bell/phone within reach;with family/visitor present Nurse Communication: Mobility status  GO     Ludia Gartland A 12/20/2013, 12:58 PM

## 2013-12-21 ENCOUNTER — Inpatient Hospital Stay (HOSPITAL_COMMUNITY): Payer: Medicare Other

## 2013-12-21 ENCOUNTER — Inpatient Hospital Stay (HOSPITAL_COMMUNITY): Payer: Medicare Other | Admitting: Occupational Therapy

## 2013-12-21 DIAGNOSIS — R5381 Other malaise: Secondary | ICD-10-CM

## 2013-12-21 DIAGNOSIS — Z8673 Personal history of transient ischemic attack (TIA), and cerebral infarction without residual deficits: Secondary | ICD-10-CM

## 2013-12-21 LAB — CBC WITH DIFFERENTIAL/PLATELET
Basophils Absolute: 0 10*3/uL (ref 0.0–0.1)
Basophils Relative: 0 % (ref 0–1)
EOS ABS: 0 10*3/uL (ref 0.0–0.7)
Eosinophils Relative: 0 % (ref 0–5)
HEMATOCRIT: 41.8 % (ref 39.0–52.0)
HEMOGLOBIN: 14.1 g/dL (ref 13.0–17.0)
LYMPHS ABS: 2.4 10*3/uL (ref 0.7–4.0)
Lymphocytes Relative: 16 % (ref 12–46)
MCH: 30.9 pg (ref 26.0–34.0)
MCHC: 33.7 g/dL (ref 30.0–36.0)
MCV: 91.5 fL (ref 78.0–100.0)
MONO ABS: 1.2 10*3/uL — AB (ref 0.1–1.0)
MONOS PCT: 8 % (ref 3–12)
Neutro Abs: 11.7 10*3/uL — ABNORMAL HIGH (ref 1.7–7.7)
Neutrophils Relative %: 77 % (ref 43–77)
Platelets: 230 10*3/uL (ref 150–400)
RBC: 4.57 MIL/uL (ref 4.22–5.81)
RDW: 14.9 % (ref 11.5–15.5)
WBC: 15.3 10*3/uL — ABNORMAL HIGH (ref 4.0–10.5)

## 2013-12-21 LAB — GLUCOSE, CAPILLARY
GLUCOSE-CAPILLARY: 207 mg/dL — AB (ref 70–99)
GLUCOSE-CAPILLARY: 260 mg/dL — AB (ref 70–99)
Glucose-Capillary: 227 mg/dL — ABNORMAL HIGH (ref 70–99)
Glucose-Capillary: 277 mg/dL — ABNORMAL HIGH (ref 70–99)

## 2013-12-21 LAB — COMPREHENSIVE METABOLIC PANEL
ALK PHOS: 67 U/L (ref 39–117)
ALT: 71 U/L — ABNORMAL HIGH (ref 0–53)
AST: 39 U/L — ABNORMAL HIGH (ref 0–37)
Albumin: 3.5 g/dL (ref 3.5–5.2)
BUN: 41 mg/dL — ABNORMAL HIGH (ref 6–23)
CO2: 31 mEq/L (ref 19–32)
CREATININE: 1.32 mg/dL (ref 0.50–1.35)
Calcium: 10.1 mg/dL (ref 8.4–10.5)
Chloride: 90 mEq/L — ABNORMAL LOW (ref 96–112)
GFR calc non Af Amer: 49 mL/min — ABNORMAL LOW (ref >90)
GFR, EST AFRICAN AMERICAN: 57 mL/min — AB (ref >90)
GLUCOSE: 239 mg/dL — AB (ref 70–99)
POTASSIUM: 4.2 meq/L (ref 3.7–5.3)
Sodium: 134 mEq/L — ABNORMAL LOW (ref 137–147)
TOTAL PROTEIN: 6.9 g/dL (ref 6.0–8.3)
Total Bilirubin: 1 mg/dL (ref 0.3–1.2)

## 2013-12-21 LAB — PROTIME-INR
INR: 1.77 — AB (ref 0.00–1.49)
Prothrombin Time: 20.1 seconds — ABNORMAL HIGH (ref 11.6–15.2)

## 2013-12-21 MED ORDER — WARFARIN - PHARMACIST DOSING INPATIENT
Freq: Every day | Status: DC
  Administered 2013-12-21 – 2013-12-30 (×3)

## 2013-12-21 MED ORDER — LEVOFLOXACIN 750 MG PO TABS
750.0000 mg | ORAL_TABLET | ORAL | Status: AC
  Administered 2013-12-22 – 2013-12-28 (×4): 750 mg via ORAL
  Filled 2013-12-21 (×4): qty 1

## 2013-12-21 MED ORDER — WARFARIN SODIUM 5 MG PO TABS
5.0000 mg | ORAL_TABLET | Freq: Once | ORAL | Status: AC
  Administered 2013-12-21: 5 mg via ORAL
  Filled 2013-12-21 (×2): qty 1

## 2013-12-21 NOTE — Progress Notes (Signed)
Patient incontinent of bladder. Copious amount of urine noted in brief at 2330. Patient scanned at 2339 for 361, cath for 600. Will monitor. Jaimeson Gopal A. Lysandra Loughmiller, RN

## 2013-12-21 NOTE — Progress Notes (Signed)
Patient information reviewed and entered into eRehab system by Sabrinna Yearwood, RN, CRRN, PPS Coordinator.  Information including medical coding and functional independence measure will be reviewed and updated through discharge.     Per nursing patient was given "Data Collection Information Summary for Patients in Inpatient Rehabilitation Facilities with attached "Privacy Act Statement-Health Care Records" upon admission.  

## 2013-12-21 NOTE — Progress Notes (Signed)
Inpatient Diabetes Program Recommendations  AACE/ADA: New Consensus Statement on Inpatient Glycemic Control (2013)  Target Ranges:  Prepandial:   less than 140 mg/dL      Peak postprandial:   less than 180 mg/dL (1-2 hours)      Critically ill patients:  140 - 180 mg/dL   Reason for Visit: Results for [name] [name], [name] [name] (MRN [id number]) as of 12/21/2013 13:28  Ref. Range 12/20/2013 06:08 12/20/2013 11:21 12/20/2013 16:19 12/20/2013 21:37 12/21/2013 07:32  Glucose-Capillary Latest Range: 70-99 mg/dL 782241 (H) 956217 (H) 213259 (H) 306 (H) 227 (H)     Please consider adding basal insulin Levemir 12 units daily and increase correction Novolog to resistant tid with meals.  Beryl MeagerJenny Oberon Hehir, RN, BC-ADM Inpatient Diabetes Coordinator Pager [phone]

## 2013-12-21 NOTE — Progress Notes (Signed)
Physical Therapy Note  Patient Details  Name: [name] [name] MRN: [id number] Date of Birth: March 20, 1931 Today's Date: 12/21/2013  1:00 - 1:45 45 minutes Individual session Patient denies pain.  Patient sitting in wheelchair upon entering room. Wife in room as well. Therapist donned double upright brace. Patient ambulated with rolling walker and [name] 10 feet with mod assist. Patient complained that he felt like left LE was "giving out" on him. Patient worked on sit to stand in parallel bars with mod assist. Patient stood and worked on stepping in place - left knee extends into recurvatum almost every step. Patient's [name] is loose at the calf and is not assisting with knee control. Patient performed 4 sets of 10 reps exercise on kinetron working on left LE extension sitting in wheelchair facing device. Patient left in wheelchair in gym with OT.    Arelia LongestWindsor, Zaelyn Barbary M 12/21/2013, 2:07 PM

## 2013-12-21 NOTE — Plan of Care (Signed)
Problem: RH PAIN MANAGEMENT Goal: RH STG PAIN MANAGED AT OR BELOW PT'S PAIN GOAL Pain goal less than 3

## 2013-12-21 NOTE — Plan of Care (Signed)
Problem: RH Stairs Goal: LTG Patient will ambulate up and down stairs w/assist (PT) LTG: Patient will ambulate up and down # of stairs with assistance (PT) With 1 rail to access home

## 2013-12-21 NOTE — Progress Notes (Signed)
UA collected; sent to lab. Order to put on condom cath not carry out due to red, sore, irritable penis. Laritza Vokes A. Kynleigh Artz, RN.

## 2013-12-21 NOTE — Progress Notes (Signed)
Occupational Therapy Session Note  Patient Details  Name: [name] [name] MRN: [id number] Date of Birth: 10-07-1931  Today's Date: 12/21/2013 Time: [id number] Time Calculation (min): 40 min  Short Term Goals: Week 1:  OT Short Term Goal 1 (Week 1): Patient will transfer to commode with mod assist OT Short Term Goal 2 (Week 1): Patient will wash lower body with mod assist OT Short Term Goal 3 (Week 1): Patient will dress lower body with max assist OT Short Term Goal 4 (Week 1): Patient will stand at sink for hygiene task with supervision  Skilled Therapeutic Interventions/Progress Updates:    Pt seen for 1:1 OT with focus on transfers, sit <> stand, and overall activity tolerance.  Pt received in w/c in therapy gym, performed stand pivot transfer with max assist to therapy mat.  Engaged in further assessment of sensation and UE ROM.  Pt with decreased ROM LUE from old CVA, however sensation intact.  Utilized 1# dowel rod with focus on activity tolerance and increased use of BUE to assist in sit > stand and transfers as pt with decreased lift off with sit > stand.  Engaged in sit > stand x3 with pt only able to complete full stand 1x with decreased standing tolerance due to weakness and fatigue.  Pt returned to room and remained in w/c with pillow at back to increase comfort.    Therapy Documentation Precautions:  Precautions Precautions: Fall Precaution Comments: chronic left hemiplegia Required Braces or Orthoses: Other Brace/Splint Other Brace/Splint: patient's wife bringing [name] Restrictions Weight Bearing Restrictions: No Pain: Pain Assessment Pain Assessment: No/denies pain  See FIM for current functional status  Therapy/Group: Individual Therapy  Rosalio LoudHOXIE, [name] [name] 12/21/2013, 2:42 PM

## 2013-12-21 NOTE — Progress Notes (Signed)
Patient ID: [name] [name], male   DOB: 09-Oct-1931, 80 y.o.   MRN: [id number]   SUBJECTIVE:   80 y/o male admitted with a/c respiratory failure in the setting of acute bronchitis and a/c diastolic HF. HR controlled in 80s (atrial fibrillation). Transferred to CIR yesterday. Doing great. Weight down another 3 lbs, (total 54 pounds). Denies SOB, orthopnea or CP. Ambulating more with brace and help of staff. Packing removed today per Dr. Annalee GentaShoemaker.      Marland Kitchen. antiseptic oral rinse  15 mL Mouth Rinse q12n4p  . atorvastatin  10 mg Oral Daily  . diltiazem  240 mg Oral Daily  . enoxaparin (LOVENOX) injection  1 mg/kg Subcutaneous Q12H  . famotidine  20 mg Oral QHS  . furosemide  80 mg Oral BID  . guaiFENesin  600 mg Oral BID  . insulin aspart  0-9 Units Subcutaneous TID WC  . levalbuterol  0.63 mg Nebulization TID  . [START ON 12/22/2013] levofloxacin  750 mg Oral Q48H  . multivitamin with minerals  1 tablet Oral Daily  . oxymetazoline  1 spray Each Nare BID  . potassium chloride  40 mEq Oral Daily  . predniSONE  10 mg Oral Q breakfast  . sodium chloride  4 spray Each Nare 6 X Daily    Filed Vitals:   12/21/13 0608 12/21/13 0926 12/21/13 0951 12/21/13 1037  BP: 114/75     Pulse: 79   60  Temp: 97.4 F (36.3 C)     TempSrc: Oral     Resp: 17     Height:      Weight: 165 lb 9.1 oz (75.1 kg)     SpO2: 96% 95% 98% 97%    Intake/Output Summary (Last 24 hours) at 12/21/13 1422 Last data filed at 12/21/13 1300  Gross per 24 hour  Intake    480 ml  Output    900 ml  Net   -420 ml    LABS: Basic Metabolic Panel:  Recent Labs  69/62/9500/06/27 0315 12/21/13 0604  NA 132* 134*  K 4.5 4.2  CL 88* 90*  CO2 28 31  GLUCOSE 306* 239*  BUN 40* 41*  CREATININE 1.30 1.32  CALCIUM 10.3 10.1   Liver Function Tests:  Recent Labs  12/21/13 0604  AST 39*  ALT 71*  ALKPHOS 67  BILITOT 1.0  PROT 6.9  ALBUMIN 3.5   No results found for this basename: LIPASE, AMYLASE,  in the last 72  hours CBC:  Recent Labs  12/19/13 0415 12/21/13 0604  WBC 12.2* 15.3*  NEUTROABS  --  11.7*  HGB 12.4* 14.1  HCT 37.7* 41.8  MCV 91.7 91.5  PLT 202 230   Cardiac Enzymes: No results found for this basename: CKTOTAL, CKMB, CKMBINDEX, TROPONINI,  in the last 72 hours BNP: No components found with this basename: POCBNP,  D-Dimer: No results found for this basename: DDIMER,  in the last 72 hours Hemoglobin A1C: No results found for this basename: HGBA1C,  in the last 72 hours Fasting Lipid Panel: No results found for this basename: CHOL, HDL, LDLCALC, TRIG, CHOLHDL, LDLDIRECT,  in the last 72 hours Thyroid Function Tests: No results found for this basename: TSH, T4TOTAL, FREET3, T3FREE, THYROIDAB,  in the last 72 hours Anemia Panel: No results found for this basename: VITAMINB12, FOLATE, FERRITIN, TIBC, IRON, RETICCTPCT,  in the last 72 hours  PHYSICAL EXAM General: NAD. Elderly. Sitting in chair Neck: JVP 7-8 cm, no thyromegaly or thyroid nodule.  Lungs:  Mild rhonchi CV: Nondisplaced PMI. Distant HS.Marland Kitchen Heart irregular S1/S2, no S3/S4, no murmur. NO edema   Abdomen: Soft, nontender, no hepatosplenomegaly, no distention.  Neurologic: Alert and oriented x 3. LUE weakness and swelling Psych: Normal affect. Extremities: No clubbing or cyanosis.   TELEMETRY: Reviewed telemetry pt in atrial fibrillation with rate in 90s  ASSESSMENT AND PLAN: 80 yo with history of chronic atrial fibrillation, COPD, and diastolic CHF admitted with respiratory failure, suspect combination of acute/chronic diastolic CHF and acute exacerbation of COPD.   1. Pulmonary: Suspect acute exacerbation COPD, possible viral bronchitis as instigator.   2. Acute on chronic diastolic CHF: Volume status much improved, down total 54 lbs. Continue PO lasix. Creatinine stable.  3. Atrial fibrillation with RVR: HR stable, on diltiazem CD. Warfarin restarted and bridging with lovenox.  4. Acute on chronic kidney injury:  Creatinine stable, will continue to follow 5. Epistaxis: No bleeding overnight. ENT has been following. INR is still sub-thereapeutic and bridging with lovenox. INR 1.7. Would like to try NOAC for patient, however he can't afford so will continue to increase coumadin and will remain in hospital until therapeutic. He has history of CVA as well as RLE DVT in posterior tibial vein.     Very glad that patient was able to get approved for CIR, I believe it will really make a difference in his condition. His volume status is stable and he continues to diurese on PO lasix and Cr stable. Will not make any changes and continue to follow kidney function. ENT took packing out of his nose today after recurrent epistaxis, will continue to follow. With INR 1.77 will stop lovenox and continue coumadin per pharmacy.   Aundria Rud, NP-C 2:22 PM  Patient seen and examined with [name], NP. We discussed all aspects of the encounter. I agree with the assessment and plan as stated above.   Patient seen in CIR gym and case discussed with CIR staff. He is doing very well with rehab. Motivated to improve. Respiratory status is good. INR 1.8 can stop lovenox. Continue coumadin. Nasal packing removed by Dr. Annalee Genta.   Darcus Edds,MD 6:09 PM

## 2013-12-21 NOTE — Progress Notes (Signed)
Subjective/Complaints: 80 y.o. male with history of COPD--O2 at nights, h/o CVA with L-HP and memory deficits, chronic CHF, A fib with RVR as well as recent admission for ischemic RLE requiring R- femoral embolectomy due to embolic event. [name] on 12/10/13 with worsening of dyspnea, anasarca, acute renal failure and left foot pain. He was treated for acute on chronic renal failure with IV diuresis and started on antibiotics for AECOPD v/s viral bronchitis. Required BIPAP due to mucous plugging. Treated with nebs and steroids and respiratory status improving. He developed acute epistaxis on 01/02/124 treated with chemical cautery and nasal packing by Dr. Wilburn Cornelia with recommendations for continued antibiotic coverage. Follow up doppler with thrombus right posterior tibial vein and no DVT LLE   Objective: Vital Signs: Blood pressure 114/75, pulse 79, temperature 97.4 F (36.3 C), temperature source Oral, resp. rate 17, height 5' 7"  (1.702 m), weight 75.1 kg (165 lb 9.1 oz), SpO2 96.00%. No results found. Results for orders placed during the hospital encounter of 12/20/13 (from the past 72 hour(s))  GLUCOSE, CAPILLARY     Status: Abnormal   Collection Time    12/20/13  9:37 PM      Result Value Range   Glucose-Capillary 306 (*) 70 - 99 mg/dL   Comment 1 Notify RN    CBC WITH DIFFERENTIAL     Status: Abnormal   Collection Time    12/21/13  6:04 AM      Result Value Range   WBC 15.3 (*) 4.0 - 10.5 K/uL   RBC 4.57  4.22 - 5.81 MIL/uL   Hemoglobin 14.1  13.0 - 17.0 g/dL   HCT 41.8  39.0 - 52.0 %   MCV 91.5  78.0 - 100.0 fL   MCH 30.9  26.0 - 34.0 pg   MCHC 33.7  30.0 - 36.0 g/dL   RDW 14.9  11.5 - 15.5 %   Platelets 230  150 - 400 K/uL   Neutrophils Relative % 77  43 - 77 %   Neutro Abs 11.7 (*) 1.7 - 7.7 K/uL   Lymphocytes Relative 16  12 - 46 %   Lymphs Abs 2.4  0.7 - 4.0 K/uL   Monocytes Relative 8  3 - 12 %   Monocytes Absolute 1.2 (*) 0.1 - 1.0 K/uL   Eosinophils  Relative 0  0 - 5 %   Eosinophils Absolute 0.0  0.0 - 0.7 K/uL   Basophils Relative 0  0 - 1 %   Basophils Absolute 0.0  0.0 - 0.1 K/uL  COMPREHENSIVE METABOLIC PANEL     Status: Abnormal   Collection Time    12/21/13  6:04 AM      Result Value Range   Sodium 134 (*) 137 - 147 mEq/L   Potassium 4.2  3.7 - 5.3 mEq/L   Chloride 90 (*) 96 - 112 mEq/L   CO2 31  19 - 32 mEq/L   Glucose, Bld 239 (*) 70 - 99 mg/dL   BUN 41 (*) 6 - 23 mg/dL   Creatinine, Ser 1.32  0.50 - 1.35 mg/dL   Calcium 10.1  8.4 - 10.5 mg/dL   Total Protein 6.9  6.0 - 8.3 g/dL   Albumin 3.5  3.5 - 5.2 g/dL   AST 39 (*) 0 - 37 U/L   ALT 71 (*) 0 - 53 U/L   Alkaline Phosphatase 67  39 - 117 U/L   Total Bilirubin 1.0  0.3 - 1.2 mg/dL   GFR  calc non Af Amer 49 (*) >90 mL/min   GFR calc Af Amer 57 (*) >90 mL/min   Comment: (NOTE)     The eGFR has been calculated using the CKD EPI equation.     This calculation has not been validated in all clinical situations.     eGFR's persistently <90 mL/min signify possible Chronic Kidney     Disease.  PROTIME-INR     Status: Abnormal   Collection Time    12/21/13  6:04 AM      Result Value Range   Prothrombin Time 20.1 (*) 11.6 - 15.2 seconds   INR 1.77 (*) 0.00 - 1.49  GLUCOSE, CAPILLARY     Status: Abnormal   Collection Time    12/21/13  7:32 AM      Result Value Range   Glucose-Capillary 227 (*) 70 - 99 mg/dL   Comment 1 Notify RN       HEENT: normal, no evidence of epistaxis Cardio: irregular and no murmurs, rate controlled Resp: CTA B/L and unlabored GI: BS positive and non distended Extremity:  Pulses absent Bilat pedal and R pop and No Edema Skin:   Intact Neuro: Alert/Oriented, Cranial Nerve Abnormalities L central 7, Normal Sensory, Abnormal Motor 3/5 L deltoid, bi, tri, grip,L quad, HF 2- ankle DF,  and Abnormal FMC Ataxic/ dec FMC Musc/Skel:  Other no pain with UE and LE ROM Gen NAD   Assessment/Plan: 1. Functional deficits secondary to Deconditioning  respiratory and renal failure as well as chronic Left Hemiparesis from CVA 55yr ago which require 3+ hours per day of interdisciplinary therapy in a comprehensive inpatient rehab setting. Physiatrist is providing close team supervision and 24 hour management of active medical problems listed below. Physiatrist and rehab team continue to assess barriers to discharge/monitor patient progress toward functional and medical goals. FIM:                   Comprehension Comprehension Mode: Auditory Comprehension: 4-Understands basic 75 - 89% of the time/requires cueing 10 - 24% of the time  Expression Expression Mode: Verbal Expression: 4-Expresses basic 75 - 89% of the time/requires cueing 10 - 24% of the time. Needs helper to occlude trach/needs to repeat words.  Social Interaction Social Interaction: 4-Interacts appropriately 75 - 89% of the time - Needs redirection for appropriate language or to initiate interaction.  Problem Solving Problem Solving: 3-Solves basic 50 - 74% of the time/requires cueing 25 - 49% of the time  Memory Memory: 3-Recognizes or recalls 50 - 74% of the time/requires cueing 25 - 49% of the time  Medical Problem List and Plan:  Deconditioning related acute renal failure, hypoxia, and multiple medical issues  1. RLE DVT/A fib/Anticoagulation: Pharmaceutical: Coumadin and Lovenox  2. Pain Management: prn tylenol    3. Mood: Will have LCSW follow for evaluation.  4. Neuropsych: This patient is capable of making decisions on his own behalf.  5. Epistaxis: Will monitor for now--may have to discuss risks v/s benefits if bleeding worsens/recurs as INR rises to therapeutic levels. Nasal packing remains in place. Antibiotic coverage while packing in place.  6. Question multilobar PNA: Will check follow CXR. Antibiotic will do 14 day course. Has persistent cough  7. AECOPD: Respiratory status improving and slow taper of steroids. Continue nebs and IS.  8. Acute on  chronic CHF: Compensated--continue low salt diet with 1500 cc fluid restriction. On lasix 80 mg bid. Check daily weights. Daily monitoring of I's and O's. Appreciate cardiology  note 9. A fib: monitor HR with bid checks. Continue diltiazem 240 mg daily.  10. GU: Foley d/c yesterday. Has frequency and urgency due to diuretics. . Toilet patient as unable to use urinal and needs to stand to empty. Condom cath at nights. Will check PVRs   LOS (Days) 1 A FACE TO FACE EVALUATION WAS PERFORMED  Rosaleigh Brazzel E 12/21/2013, 7:34 AM

## 2013-12-21 NOTE — Progress Notes (Signed)
   ENT Progress Note: Recurrent Epistaxis Nasal packing for 5 d   Subjective: Pt reports congestion, min intermittent bleeding   Objective: Vital signs in last 24 hours: Temp:  [97.4 F (36.3 C)-97.6 F (36.4 C)] 97.4 F (36.3 C) (01/08 11910608) Pulse Rate:  [60-112] 60 (01/08 1037) Resp:  [17-20] 17 (01/08 0608) BP: (114-145)/(55-87) 114/75 mmHg (01/08 0608) SpO2:  [95 %-98 %] 97 % (01/08 1037) Weight:  [75.1 kg (165 lb 9.1 oz)-76.6 kg (168 lb 14 oz)] 75.1 kg (165 lb 9.1 oz) (01/08 [id number]) Weight change:  Last BM Date: 12/20/13  Intake/Output from previous day: 01/07 0701 - 01/08 0700 In: -  Out: 900 [Urine:900] Intake/Output this shift: Total I/O In: 240 [P.O.:240] Out: -   Labs:  Recent Labs  12/19/13 0415 12/21/13 0604  WBC 12.2* 15.3*  HGB 12.4* 14.1  HCT 37.7* 41.8  PLT 202 230    Recent Labs  12/20/13 0315 12/21/13 0604  NA 132* 134*  K 4.5 4.2  CL 88* 90*  CO2 28 31  GLUCOSE 306* 239*  BUN 40* 41*  CALCIUM 10.3 10.1    Studies/Results: Dg Chest 2 View  12/21/2013   CLINICAL DATA:  Pneumonia  EXAM: CHEST  2 VIEW  COMPARISON:  12/18/2003  FINDINGS: Mild improvement in right lower lobe airspace disease. Underlying COPD is suspected. There is cardiac enlargement without heart failure or effusion.  IMPRESSION: Slight improvement in right lower lobe infiltrate, consistent with pneumonia.   Electronically Signed   By: Marlan Palauharles  Clark M.D.   On: 12/21/2013 08:43     PHYSICAL EXAM: Rt nasal packing removed Eschar at cautery site Min bloody d/c   Assessment/Plan: Recurrent epistaxis, packing removed Pt cont anti-coag with Lovenox No further antibiotics required for nasal and sinus issues frq saline nasal spray q 4 hrs and prn May use afrin if bleeding Expect min bloody d/c, reconsult if sig. Heavy bleeding    Cheyenne Bordeaux 12/21/2013, 12:00 PM

## 2013-12-21 NOTE — Evaluation (Signed)
Occupational Therapy Assessment and Plan  Patient Details  Name: [name] [name] MRN: [id number] Date of Birth: Jun 20, 1931  OT Diagnosis: abnormal posture, apraxia, blindness and low vision, cognitive deficits, hemiplegia affecting non-dominant side, muscular wasting and disuse atrophy and muscle weakness (generalized) Rehab Potential: Rehab Potential: Good ELOS: 10-12 days   Today's Date: 12/21/2013 Time: 5681-2751 Time Calculation (min): 45 min  Problem List:  Patient Active Problem List   Diagnosis Date Noted  . Malnutrition of moderate degree 12/20/2013  . Physical deconditioning 12/20/2013  . DVT (deep venous thrombosis) 12/18/2013  . Anasarca 12/10/2013  . Acute on chronic diastolic heart failure 70/12/7492  . Acute renal failure 12/10/2013  . Anemia 12/10/2013  . UTI (urinary tract infection) 12/10/2013  . Diabetes mellitus with hypoglycemia 12/10/2013  . Acute on chronic diastolic CHF (congestive heart failure) 12/10/2013  . Peripheral vascular disease 12/10/2013  . Acute on chronic diastolic congestive heart failure 12/10/2013  . H/O: CVA (cerebrovascular accident) 12/01/2013  . COPD (chronic obstructive pulmonary disease) 12/01/2013  . Acute pulmonary edema 12/01/2013  . DM type 2, controlled, with complication 49/67/5916  . Ischemic leg 11/24/2013  . Atrial fibrillation 11/24/2013  . Acute respiratory failure with hypoxia 11/24/2013  . Hypotension 11/24/2013    Past Medical History:  Past Medical History  Diagnosis Date  . Hypertension   . Stroke   . COPD (chronic obstructive pulmonary disease)   . Diabetes mellitus without complication   . GERD (gastroesophageal reflux disease)   . A-fib    Past Surgical History:  Past Surgical History  Procedure Laterality Date  . Carotid stent    . Parotid gland tumor excision    . Embolectomy Right 11/23/2013    Procedure: Right Femoral EMBOLECTOMY;  Surgeon: Angelia Mould, MD;  Location: [facility] OR;  Service:  Vascular;  Laterality: Right;  Right Femoral Embolectomy with Bovine Paricardium patch angioplasty.    Assessment & Plan Clinical Impression: Patient is a 80 y.o. year old male history of COPD--O2 at nights, h/o CVA with L-HP and memory deficits, chronic CHF, A fib with RVR as well as recent admission for ischemic RLE requiring R- femoral embolectomy due to embolic event. [name] on 12/10/13 with worsening of dyspnea, anasarca, acute renal failure and left foot pain. He was treated for acute on chronic renal failure with IV diuresis and started on antibiotics for AECOPD v/s viral bronchitis. Required BIPAP due to mucous plugging. Treated with nebs and steroids and respiratory status improving. He developed acute epistaxis on 01/02/124 treated with chemical cautery and nasal packing by Dr. Wilburn Cornelia with recommendations for continued antibiotic coverage. Follow up doppler with thrombus right posterior tibial vein and no DVT LLE. Coumadin resumed 12/18/13 Patient transferred to CIR on 12/20/2013 .    Patient currently requires max with basic self-care skills secondary to muscle weakness, muscle joint tightness and muscle paralysis and decreased cardiorespiratoy endurance and decreased oxygen support.  Prior to hospitalization, patient could complete Basic self care skills with min.  Patient will benefit from skilled intervention to decrease level of assist with basic self-care skills and increase independence with basic self-care skills prior to discharge home with care partner.  Anticipate patient will require minimal physical assistance and follow up home health.  OT - End of Session Activity Tolerance: Tolerates 10 - 20 min activity with multiple rests Endurance Deficit: Yes Endurance Deficit Description: patient with shortness of breath after transition to stand.   OT Assessment Rehab Potential: Good OT Patient demonstrates impairments in  the following area(s):  Balance;Cognition;Endurance;Motor OT Basic ADL's Functional Problem(s): Eating;Grooming;Bathing;Dressing;Toileting OT Transfers Functional Problem(s): Toilet;Tub/Shower OT Plan OT Intensity: Minimum of 1-2 x/day, 45 to 90 minutes OT Frequency: 5 out of 7 days OT Duration/Estimated Length of Stay: 10-12 days OT Treatment/Interventions: Balance/vestibular training;Patient/family education;Self Care/advanced ADL retraining;Therapeutic Exercise;UE/LE Coordination activities;UE/LE Strength taining/ROM;Therapeutic Activities;Functional mobility training;DME/adaptive equipment instruction;Discharge planning OT Self Feeding Anticipated Outcome(s): independent OT Basic Self-Care Anticipated Outcome(s): minimal assistance OT Toileting Anticipated Outcome(s): supervision OT Bathroom Transfers Anticipated Outcome(s): supervision OT Recommendation Recommendations for Other Services: Neuropsych consult Patient destination: Home Follow Up Recommendations: Home health OT Equipment Recommended: To be determined   Skilled Therapeutic Intervention Patient seen this am for OT assessment.  [name] to bring clothing and [name] later today.  Patient very pleasant, yet poor historian.  Patient able to tolerate room air during this activity, 02 sat at 98%.  Patient with heavy reliance on Bilateral upper extremities for transition from sit to stand.  Patient with limited stand tolerance, and limited ability to control descent to chair with each stand.  Posterior bias in both sitting and standing tasks this am.    OT Evaluation Precautions/Restrictions  Precautions Precautions: Fall Precaution Comments: chronic left hemiplegia Required Braces or Orthoses: Other Brace/Splint Other Brace/Splint: patient's wife bringing [name] Restrictions Weight Bearing Restrictions: No General Chart Reviewed: Yes Amount of Missed OT Time (min): 15 Minutes Missed Time Reason: Unavailable (comment) Family/Caregiver Present:  No Vital Signs Therapy Vitals Pulse Rate: 60 Oxygen Therapy SpO2: 97 % O2 Device: None (Room air) Pulse Oximetry Type: Intermittent Pain Pain Assessment Pain Assessment: No/denies pain Home Living/Prior Functioning Home Living Available Help at Discharge: Family Type of Home: House Home Access: Stairs to enter [employer] of Steps: 4 to 5 Entrance Stairs-Rails:  (pt can't remember) Home Layout: One level  Lives With: Spouse Prior Function Level of Independence: Independent with transfers;Independent with gait;Other (comment) (patient reports using rollator outside home)  Able to Take Stairs?: Yes Driving: No Vocation: Retired   Radiographer, therapeutic - History Baseline Vision: Other (comment) (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) Visual History: Cataracts Patient Visual Report: No change from baseline Vision - Assessment Vision Assessment: Vision not tested Praxis Praxis: Impaired Praxis Impairment Details: Motor planning  Cognition Overall Cognitive Status: History of cognitive impairments - at baseline Arousal/Alertness: Awake/alert Orientation Level: Oriented X4 Attention: Sustained Sustained Attention: Appears intact Memory: Impaired Memory Impairment: Retrieval deficit Awareness: Impaired Awareness Impairment: Anticipatory impairment Problem Solving: Impaired Problem Solving Impairment: Functional complex Executive Function: Sequencing;Decision Making;Initiating Sequencing: Impaired Decision Making: Impaired Decision Making Impairment: Functional basic Initiating: Impaired Initiating Impairment: Functional basic Safety/Judgment: Appears intact Sensation Coordination Gross Motor Movements are Fluid and Coordinated: No Fine Motor Movements are Fluid and Coordinated: No Coordination and Movement Description: left hemiparesis, unbalanced muscle activity Motor  Motor Motor: Hemiplegia;Abnormal tone;Motor apraxia;Abnormal  postural alignment and control Motor - Skilled Clinical Observations: avoids weight shift opver left hip in sitting, preferes mass behind base of support in sitting    Trunk/Postural Assessment  Cervical Assessment Cervical Assessment: Exceptions to [facility] (forward head posture) Thoracic Assessment Thoracic Assessment: Exceptions to [facility] (flexed posture) Lumbar Assessment Lumbar Assessment: Exceptions to [facility] (posterior pelvis) Postural Control Postural Control: Deficits on evaluation  Balance Balance Balance Assessed: Yes Static Sitting Balance Static Sitting - Balance Support: Feet supported;No upper extremity supported Static Sitting - Level of Assistance: 4: Min assist Dynamic Sitting Balance Dynamic Sitting - Balance Support: During functional activity;Feet supported Dynamic Sitting - Level of Assistance:  4: Min Insurance risk surveyor Standing - Balance Support: During functional activity Static Standing - Level of Assistance: 2: Max assist Extremity/Trunk Assessment RUE Assessment RUE Assessment: Within Functional Limits LUE Assessment LUE Assessment: Exceptions to [facility] (chronic hemiparesis)  FIM:  FIM - Eating Eating Activity: 6: Swallowing techniques: self-managed;5: Set-up assist for open containers (uses straw for liquids to prevent spillage) FIM - Grooming Grooming Steps: Wash, rinse, dry face;Wash, rinse, dry hands;Oral care, brush teeth, clean dentures Grooming: 4: Patient completes 3 of 4 or 4 of 5 steps FIM - Bathing Bathing Steps Patient Completed: Chest;Right Arm;Left Arm;Abdomen;Right upper leg;Left upper leg Bathing: 3: Mod-Patient completes 5-7 [id number] parts or 50-74% FIM - Upper Body Dressing/Undressing Upper body dressing/undressing: 0: Wears gown/pajamas-no public clothing FIM - Lower Body Dressing/Undressing Lower body dressing/undressing steps patient completed:  (wife is bringing clothes later this morning) Lower body dressing/undressing: 0:  Wears gown/pajamas-no public clothing FIM - Toileting Toileting: 0: Activity did not occur (declined need) FIM - Bed/Chair Transfer Bed/Chair Transfer: 2: Supine > Sit: Max A (lifting assist/Pt. 25-49%);2: Bed > Chair or W/C: Max A (lift and lower assist) FIM - TAir cabin crewTransfers: 0-Activity did not occur FIM - Tub/Shower Transfers Tub/shower Transfers: 0-Activity did not occur or was simulated (due to limited time after xray)   Refer to Care Plan for Long Term Goals  Recommendations for other services: Neuropsych  Discharge Criteria: Patient will be discharged from OT if patient refuses treatment 3 consecutive times without medical reason, if treatment goals not met, if there is a change in medical status, if patient makes no progress towards goals or if patient is discharged from hospital.  The above assessment, treatment plan, treatment alternatives and goals were discussed and mutually agreed upon: by patient  GMariah Milling1/07/2014, 10:43 AM

## 2013-12-21 NOTE — Progress Notes (Signed)
Social Work Assessment and Plan Social Work Assessment and Plan  Patient Details  Name: [name] [name] MRN: [id number] Date of Birth: May 25, 1931  Today's Date: 12/21/2013  Problem List:  Patient Active Problem List   Diagnosis Date Noted  . Malnutrition of moderate degree 12/20/2013  . Physical deconditioning 12/20/2013  . DVT (deep venous thrombosis) 12/18/2013  . Anasarca 12/10/2013  . Acute on chronic diastolic heart failure 12/10/2013  . Acute renal failure 12/10/2013  . Anemia 12/10/2013  . UTI (urinary tract infection) 12/10/2013  . Diabetes mellitus with hypoglycemia 12/10/2013  . Acute on chronic diastolic CHF (congestive heart failure) 12/10/2013  . Peripheral vascular disease 12/10/2013  . Acute on chronic diastolic congestive heart failure 12/10/2013  . H/O: CVA (cerebrovascular accident) 12/01/2013  . COPD (chronic obstructive pulmonary disease) 12/01/2013  . Acute pulmonary edema 12/01/2013  . DM type 2, controlled, with complication 12/01/2013  . Ischemic leg 11/24/2013  . Atrial fibrillation 11/24/2013  . Acute respiratory failure with hypoxia 11/24/2013  . Hypotension 11/24/2013   Past Medical History:  Past Medical History  Diagnosis Date  . Hypertension   . Stroke   . COPD (chronic obstructive pulmonary disease)   . Diabetes mellitus without complication   . GERD (gastroesophageal reflux disease)   . A-fib    Past Surgical History:  Past Surgical History  Procedure Laterality Date  . Carotid stent    . Parotid gland tumor excision    . Embolectomy Right 11/23/2013    Procedure: Right Femoral EMBOLECTOMY;  Surgeon: Chuck Hinthristopher S Dickson, MD;  [facility];  Service: Vascular;  Laterality: Right;  Right Femoral Embolectomy with Bovine Paricardium patch angioplasty.   Social History:  reports that he has quit smoking. He does not have any smokeless tobacco history on file. He reports that he does not drink alcohol or use illicit drugs.  Family /  Support Systems Marital Status: Married Patient Roles: Spouse;Parent Spouse/Significant Other: [name] [phone]-home Children: Two children one in [place] and one in [place] Other Supports: Friends Anticipated Caregiver: Wife Ability/Limitations of Caregiver: Wife has some health issues but manages Caregiver Availability: 24/7 Family Dynamics: Close knit family who are willing to do for one another.  Pt and wife help each other to get done what they need to do.  Pt reports his family is very supportive.  Social History Preferred language: English Religion: Quaker Cultural Background: No issues Education: [employer] Read: Yes Write: Yes Employment Status: Retired Fish farm managerLegal Hisotry/Current Legal Issues: No issues Guardian/Conservator: None-according to MD pt is capable fo making his own decisions while here   Abuse/Neglect Physical Abuse: Denies Verbal Abuse: Denies Sexual Abuse: Denies Exploitation of patient/patient's resources: Denies Self-Neglect: Denies  Emotional Status Pt's affect, behavior adn adjustment status: Pt is motivated and will do what he needs to do to go home.  He does not want to burden his wife but would like to go home instead of a SNF when he leaves here.  He needs to get mobile to return home. Recent Psychosocial Issues: Other health issues-has multiple issues Pyschiatric History: No history deferred depression screen due to pt felt it was not necessary at this time.  He feels he is adjusting well and not needed.  Will monitor him while here and intervene if necessary Substance Abuse History: No issues  Patient / Family Perceptions, Expectations & Goals Pt/Family understanding of illness & functional limitations: Pt and wife have a good understanidng of his health issues, both are pleased with how well he is  doing now and feel the main issue is regaining his strength while here.  His wife will be here when needed and tries to visit daily. 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, Church member, etc Anticipated changes in roles/activities/participation: Plans to resume at discharge Pt/family expectations/goals: Pt states; ' I will do what I need to, but want to go home, but not burden my wife."  Wife states; " I hope he will be able to move around when he leaves here."  [employer]: Other (Comment) (had in past) Premorbid Home Care/DME Agencies: Other (Comment) (Had in past) Transportation available at discharge: Wife drives Resource referrals recommended: Support group (specify) (CHF & CVA SUpport grou)  Discharge Planning Living Arrangements: Spouse/significant other Support Systems: Spouse/significant other;Children;Friends/neighbors;Church/faith community Type of Residence: Private residence Insurance Resources: Media planner (specify) ([employer] Medicare) Financial Resources: Social Security Financial Screen Referred: No Living Expenses: Own Money Management: Patient;Spouse Does the patient have any problems obtaining your medications?: No Home Management: Wife, pt tries to help with some Patient/Family Preliminary Plans: Return home with wife who can provide some assist, but not much physical assist.  Pt neds to get to supervision/light min assist level to return home. Has been here beofre 17 years ago and remembers the process, just do not stay as long as then. Social Work Anticipated Follow Up Needs: HH/OP;Support Group  Clinical Impression Very pleasant gentleman who is glad to be here and remembers when he was here before-17 years ago.  Main goal is to get mobile so wife does not need to provide physical care. Wife is very supportive and willing to do what she needs to assist husband. Will work on safe discharge plan.  Lucy Chris 12/21/2013, 10:56 AM

## 2013-12-21 NOTE — Evaluation (Signed)
Physical Therapy Assessment and Plan  Patient Details  Name: [name] [name] MRN: [id number] Date of Birth: [birth year]  PT Diagnosis: Abnormal posture, Abnormality of gait, Difficulty walking, Hemiparesis dominant and Muscle weakness Rehab Potential: Good ELOS: 10 days   Today's Date: 12/21/2013 Time: 0930-1030 Time Calculation (min): 60 min  Problem List:  Patient Active Problem List   Diagnosis Date Noted  . Malnutrition of moderate degree 12/20/2013  . Physical deconditioning 12/20/2013  . DVT (deep venous thrombosis) 12/18/2013  . Anasarca 12/10/2013  . Acute on chronic diastolic heart failure 78/29/5621  . Acute renal failure 12/10/2013  . Anemia 12/10/2013  . UTI (urinary tract infection) 12/10/2013  . Diabetes mellitus with hypoglycemia 12/10/2013  . Acute on chronic diastolic CHF (congestive heart failure) 12/10/2013  . Peripheral vascular disease 12/10/2013  . Acute on chronic diastolic congestive heart failure 12/10/2013  . H/O: CVA (cerebrovascular accident) 12/01/2013  . COPD (chronic obstructive pulmonary disease) 12/01/2013  . Acute pulmonary edema 12/01/2013  . DM type 2, controlled, with complication 30/86/5784  . Ischemic leg 11/24/2013  . Atrial fibrillation 11/24/2013  . Acute respiratory failure with hypoxia 11/24/2013  . Hypotension 11/24/2013    Past Medical History:  Past Medical History  Diagnosis Date  . Hypertension   . Stroke   . COPD (chronic obstructive pulmonary disease)   . Diabetes mellitus without complication   . GERD (gastroesophageal reflux disease)   . A-fib    Past Surgical History:  Past Surgical History  Procedure Laterality Date  . Carotid stent    . Parotid gland tumor excision    . Embolectomy Right 11/23/2013    Procedure: Right Femoral EMBOLECTOMY;  Surgeon: Angelia Mould, MD;  Location: [facility] OR;  Service: Vascular;  Laterality: Right;  Right Femoral Embolectomy with Bovine Paricardium patch angioplasty.     Assessment & Plan Clinical Impression: [name] [name] is a 80 y.o. male with history of COPD--O2 at nights, h/o CVA with L-HP and memory deficits, chronic CHF, A fib with RVR as well as recent admission for ischemic RLE requiring R- femoral embolectomy due to embolic event. [name] on 12/10/13 with worsening of dyspnea, anasarca, acute renal failure and left foot pain. He was treated for acute on chronic renal failure with IV diuresis and started on antibiotics for AECOPD v/s viral bronchitis. Required BIPAP due to mucous plugging. Treated with nebs and steroids and respiratory status improving. He developed acute epistaxis on 01/02/124 treated with chemical cautery and nasal packing by Dr. Wilburn Cornelia with recommendations for continued antibiotic coverage. Follow up doppler with thrombus right posterior tibial vein and no DVT LLE. Coumadin resumed 12/18/13 --he has had intermittent bleeding early am today. Nasal packing remains in place. Patient noted to be deconditioned and CIR recommended by therapy team. Patient admitted to CIR on 12/20/2013.  Patient currently requires mod with mobility secondary to decreased standing balance, decreased postural control and hemiplegia.  Prior to hospitalization, patient was modified independent  with mobility and lived with Spouse in a House home.  Home access is 4 to 5Stairs to enter.  Patient will benefit from skilled PT intervention to maximize safe functional mobility, minimize fall risk and decrease caregiver burden for planned discharge home with intermittent assist.  Anticipate patient will benefit from follow up [facility] at discharge.  PT - End of Session Endurance Deficit: Yes Endurance Deficit Description: patient with shortness of breath after transition to stand.   PT Assessment Rehab Potential: Good PT Patient demonstrates impairments in the following  area(s): Balance;Motor PT Transfers Functional Problem(s): Bed Mobility;Bed to Chair;Car PT  Locomotion Functional Problem(s): Ambulation;Stairs PT Plan PT Intensity: Minimum of 1-2 x/day ,45 to 90 minutes PT Frequency: 5 out of 7 days PT Duration Estimated Length of Stay: 10 days PT Treatment/Interventions: Ambulation/gait training;Balance/vestibular training;Discharge planning;Functional mobility training;Patient/family education;Stair training;Therapeutic Activities;Therapeutic Exercise;UE/LE Strength taining/ROM;UE/LE Coordination activities PT Transfers Anticipated Outcome(s): supervision PT Locomotion Anticipated Outcome(s): supervision household distances with RW; min assist with stairs PT Recommendation Follow Up Recommendations: Home health PT Patient destination: Home Equipment Recommended: To be determined Equipment Details: patient has rollator and cane  Skilled Therapeutic Intervention Patient sitting at sink brushing teeth upon entering room. Patient pushed in wheelchair to gym. Therapist attempted ambulation however left ankle tends to supinate and decreased control of left knee without upright [name]. Await further ambulation once wife brings in brace. Patient performed squat pivot transfer wheelchair to mat with mod assist. Patient assessed for static and dynamic sitting balance. Patient sit to supine with min assist. Patient performed LE exercises actively on right and active assistive on left for ROM and strength including heel slides/hip flexion, and hip abduction x 10 reps each. Patient supine to sit with mod assist. Patient required assist to lift trunk upright from left side lying. Patient ambulated up and down 3 steps with bilateral rails and +1 mod assist for balance and +1 assist for safety. Patient required some facilitation for left LE control. Patient began bleeding from right elbow. Nurse notified. Patient left in room with nurse attending to skin.     PT Evaluation Precautions/Restrictions Precautions Precautions: Fall Precaution Comments: chronic left  hemiplegia Required Braces or Orthoses: Other Brace/Splint Other Brace/Splint: patient's wife bringing [name] Restrictions Weight Bearing Restrictions: No General Chart Reviewed: Yes Missed Time Reason: Unavailable (comment) Response to Previous Treatment: Patient with no complaints from previous session. Family/Caregiver Present: No Vital SignsTherapy Vitals Pulse Rate: 60 Oxygen Therapy SpO2: 97 % O2 Device: None (Room air) Pulse Oximetry Type: Intermittent Pain Pain Assessment Pain Assessment: No/denies pain Home Living/Prior Functioning Home Living Living Arrangements: Spouse/significant other Available Help at Discharge: Family Type of Home: House Home Access: Stairs to enter [employer] of Steps: 4 to 5 Entrance Stairs-Rails:  (pt can't remember) Home Layout: One level  Lives With: Spouse Prior Function Level of Independence: Independent with transfers;Independent with gait;Other (comment) (patient reports using rollator outside home)  Able to Take Stairs?: Yes Driving: No Vocation: Retired Radiographer, therapeutic - History Baseline Vision: Other (comment) (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) Visual History: Cataracts Patient Visual Report: No change from baseline Vision - Assessment Vision Assessment: Vision not tested Praxis Praxis: Impaired Praxis Impairment Details: Motor planning  Cognition Overall Cognitive Status: History of cognitive impairments - at baseline Arousal/Alertness: Awake/alert Orientation Level: Oriented X4 Attention: Sustained Sustained Attention: Appears intact Memory: Impaired Memory Impairment: Retrieval deficit Awareness: Impaired Awareness Impairment: Anticipatory impairment Problem Solving: Impaired Problem Solving Impairment: Functional complex Executive Function: Sequencing;Decision Making;Initiating Sequencing: Impaired Decision Making: Impaired Decision Making Impairment: Functional  basic Initiating: Impaired Initiating Impairment: Functional basic Safety/Judgment: Appears intact Sensation Sensation Light Touch: Appears Intact Stereognosis: Not tested Hot/Cold: Not tested Proprioception: Not tested Coordination Gross Motor Movements are Fluid and Coordinated: No Fine Motor Movements are Fluid and Coordinated: No Coordination and Movement Description: left hemiparesis Finger Nose Finger Test: able to perform bilaterally; some slowing on left Motor  Motor Motor: Hemiplegia;Abnormal postural alignment and control Motor - Skilled Clinical Observations: avoids weight shift opver left hip in sitting, preferes  mass behind base of support in sitting  Mobility Bed Mobility Left Sidelying to Sit: 3: Mod assist;HOB flat Left Sidelying to Sit Details (indicate cue type and reason): patient requires lifting assistance from sidelying. Patient able to bring bilateral LE's off mat without assistance  Sit to Supine: 4: Min assist Transfers Transfers: Yes Sit to Stand: 3: Mod assist;With upper extremity assist;With armrests;From chair/3-in-1 Stand to Sit: 4: Min assist Stand Pivot Transfers: 3: Mod assist Locomotion  Ambulation Ambulation: Yes Ambulation/Gait Assistance: 3: Mod assist Ambulation Distance (Feet): 4 Feet Assistive device: Rolling walker Ambulation/Gait Assistance Details: assist to propel RW forward; pt tends to supinate left foot and with decreased knee control during stance; weight posterior;  Gait Gait: Yes Gait Pattern: Step-to pattern;Decreased step length - right;Decreased step length - left;Decreased stance time - left;Decreased hip/knee flexion - right;Decreased hip/knee flexion - left;Trunk flexed;Narrow base of support;Left genu recurvatum Stairs / Additional Locomotion Stairs: Yes Stairs Assistance: 1: +2 Total assist Stairs Assistance Details (indicate cue type and reason): patient needed mod assist up and down 3 steps and +1 for safety Stair  Management Technique: Two rails Number of Stairs: 3 Height of Stairs: 4  Trunk/Postural Assessment  Cervical Assessment Cervical Assessment: Exceptions to [facility] (forward head posture) Thoracic Assessment Thoracic Assessment: Exceptions to [facility] (flexed posture) Lumbar Assessment Lumbar Assessment: Exceptions to [facility] (posterior pelvis) Postural Control Postural Control: Deficits on evaluation  Balance Balance Balance Assessed: Yes Static Sitting Balance Static Sitting - Balance Support: Bilateral upper extremity supported;Feet supported Static Sitting - Level of Assistance: 5: Stand by assistance Dynamic Sitting Balance Dynamic Sitting - Balance Support: During functional activity;Feet supported Dynamic Sitting - Level of Assistance: 5: Stand by assistance Static Standing Balance Static Standing - Balance Support: Bilateral upper extremity supported Static Standing - Level of Assistance: 3: Mod assist Extremity Assessment  RUE Assessment RUE Assessment: Within Functional Limits LUE Assessment LUE Assessment: Exceptions to [facility] LUE Strength LUE Overall Strength: Deficits;Due to premorbid status LUE Overall Strength Comments: shld flexion to ~ 90 degrees; active elbow flexion against gravity; gross grasp and release RLE Assessment RLE Assessment: Exceptions to [facility] RLE Strength RLE Overall Strength: Deficits RLE Overall Strength Comments: grossly 3+ hip flexion; 4- knee extension; 3+ knee flexion and ankle dorsiflexion LLE Assessment LLE Assessment: Exceptions to [facility] LLE Strength LLE Overall Strength: Deficits;Due to premorbid status LLE Overall Strength Comments: hip flexion, knee extension, and ankle dorsiflexion  3-/5 - active movement against gravity but not through full ROM  FIM:  FIM - Bed/Chair Transfer Bed/Chair Transfer: 3: Supine > Sit: Mod A (lifting assist/Pt. 50-74%/lift 2 legs;4: Sit > Supine: Min A (steadying pt. > 75%/lift 1 leg);3: Bed > Chair or W/C: Mod A (lift  or lower assist);3: Chair or W/C > Bed: Mod A (lift or lower assist) FIM - Locomotion: Wheelchair Locomotion: Wheelchair: 1: Total Assistance/staff pushes wheelchair (Pt<25%) FIM - Locomotion: Ambulation Locomotion: Ambulation Assistive Devices: Administrator Ambulation/Gait Assistance: 3: Mod assist Locomotion: Ambulation: 1: Travels less than 50 ft with moderate assistance (Pt: 50 - 74%) FIM - Locomotion: Stairs Locomotion: Scientist, physiological: Hand rail - 2 Locomotion: Stairs: 1: Two helpers   Refer to [employer] for Long Term Goals  Recommendations for other services: None  Discharge Criteria: Patient will be discharged from PT if patient refuses treatment 3 consecutive times without medical reason, if treatment goals not met, if there is a change in medical status, if patient makes no progress towards goals or if patient is discharged  from hospital.  The above assessment, treatment plan, treatment alternatives and goals were discussed and mutually agreed upon: by patient  Sanjuana Letters 12/21/2013, 11:22 AM

## 2013-12-21 NOTE — Care Management Note (Signed)
[facility] Individual Statement of Services  Patient Name:  [name] GoodyBobby [name]  Date:  12/21/2013  Welcome to the [facility].  Our goal is to provide you with an individualized program based on your diagnosis and situation, designed to meet your specific needs.  With this comprehensive rehabilitation program, you will be expected to participate in at least 3 hours of rehabilitation therapies Monday-Friday, with modified therapy programming on the weekends.  Your rehabilitation program will include the following services:  Physical Therapy (PT), Occupational Therapy (OT), 24 hour per day rehabilitation nursing, Case Management (Social Worker), Rehabilitation Medicine, Nutrition Services and Pharmacy Services  Weekly team conferences will be held on Wednesday  to discuss your progress.  Your Social Worker will talk with you frequently to get your input and to update you on team discussions.  Team conferences with you and your family in attendance may also be held.  Expected length of stay: 10-12 days Overall anticipated outcome: supervision/min level  Depending on your progress and recovery, your program may change. Your Social Worker will coordinate services and will keep you informed of any changes. Your Social Worker's name and contact numbers are listed  below.  The following services may also be recommended but are not provided by the [facility]:   Driving Evaluations  Home Health Rehabiltiation Services  Outpatient Rehabilitation Services    Arrangements will be made to provide these services after discharge if needed.  Arrangements include referral to agencies that provide these services.  Your insurance has been verified to be:  [employer] Your primary doctor is:  Looking for one  Pertinent information will be shared with your doctor and your insurance company.  Social Worker:  Dossie DerBecky Osaze Hubbert, SW [phone] or ([phone])  [phone]  Information discussed with and copy given to patient by: Lucy Chrisupree, Kyrielle Urbanski G, 12/21/2013, 10:41 AM

## 2013-12-21 NOTE — Progress Notes (Signed)
ANTICOAGULATION CONSULT NOTE - Follow-up  Pharmacy Consult for warfarin  Indication: atrial fibrillation  No Known Allergies  Patient Measurements: Height: 5\' 7"  (170.2 cm) Weight: 165 lb 9.1 oz (75.1 kg) IBW/kg (Calculated) : 66.1  Vital Signs: Temp: 97.4 F (36.3 C) (01/08 0608) Temp src: Oral (01/08 0608) BP: 114/75 mmHg (01/08 0608) Pulse Rate: 60 (01/08 1037)  Labs:  Recent Labs  12/19/13 0415 12/20/13 0315 12/21/13 0604  HGB 12.4*  --  14.1  HCT 37.7*  --  41.8  PLT 202  --  230  LABPROT 16.0* 16.1* 20.1*  INR 1.31 1.32 1.77*  CREATININE 1.22 1.30 1.32    Estimated Creatinine Clearance: 41.7 ml/min (by C-G formula based on Cr of 1.32).  Assessment: 82 YOM admitted with PNA treated with ABX, HFpEF with volume overload and Afib with CVR on diltiazem.  He was continued on warfarin for afib but developed epistaxis - his nose was packed by ENT and warfarin was held. Epistaxis resolved, INR 1.53 and new RLE DVT found 1/4.  Decision was made to restart anticoagulation and monitior closely.    Warfarin PTA 2.5mg  daily - previously on amio - stopped day of last d/c 12/05/13  1/6 INR remains subtherapeutic at 1.31.  No epistaxis noted today.  1/7 INR 1.32.  Per patient report had a little epistaxis this morning.  Dr. Gala RomneyBensimhon suggesting stopping LMWH when INR 1.8 or greater given recent severe epistaxis in his 12/20/13 chart note.   1/8 - INR rising to 1.77.  Also on levaquin day # 10 for HCAP.  Creat 1.3.  Last WBC 12.2 on 1/6. AF.   Goal of Therapy:  INR 2-3 Monitor platelets by anticoagulation protocol: Yes  Plan:  Coumadin 5 mg x 1 Continue Lovenox 1mg /kg = 80mg  q12hr until INR > 2 and 5 days overlap for VTE Daily PT/INR Monitor s/s bleeding  Tad MooreJessica Khing Belcher, Pharm D, BCPS  Clinical Pharmacist Pager [phone]  12/21/2013 2:27 PM

## 2013-12-22 ENCOUNTER — Inpatient Hospital Stay (HOSPITAL_COMMUNITY): Payer: Medicare Other | Admitting: Occupational Therapy

## 2013-12-22 ENCOUNTER — Inpatient Hospital Stay (HOSPITAL_COMMUNITY): Payer: Medicare Other | Admitting: Physical Therapy

## 2013-12-22 DIAGNOSIS — I82409 Acute embolism and thrombosis of unspecified deep veins of unspecified lower extremity: Secondary | ICD-10-CM

## 2013-12-22 LAB — URINE CULTURE
COLONY COUNT: NO GROWTH
Culture: NO GROWTH

## 2013-12-22 LAB — BASIC METABOLIC PANEL
BUN: 47 mg/dL — AB (ref 6–23)
CHLORIDE: 92 meq/L — AB (ref 96–112)
CO2: 27 mEq/L (ref 19–32)
Calcium: 9.5 mg/dL (ref 8.4–10.5)
Creatinine, Ser: 1.48 mg/dL — ABNORMAL HIGH (ref 0.50–1.35)
GFR calc Af Amer: 50 mL/min — ABNORMAL LOW (ref >90)
GFR calc non Af Amer: 43 mL/min — ABNORMAL LOW (ref >90)
Glucose, Bld: 252 mg/dL — ABNORMAL HIGH (ref 70–99)
POTASSIUM: 4.2 meq/L (ref 3.7–5.3)
Sodium: 133 mEq/L — ABNORMAL LOW (ref 137–147)

## 2013-12-22 LAB — GLUCOSE, CAPILLARY
GLUCOSE-CAPILLARY: 240 mg/dL — AB (ref 70–99)
Glucose-Capillary: 222 mg/dL — ABNORMAL HIGH (ref 70–99)
Glucose-Capillary: 337 mg/dL — ABNORMAL HIGH (ref 70–99)
Glucose-Capillary: 281 mg/dL — ABNORMAL HIGH (ref 70–99)

## 2013-12-22 LAB — PROTIME-INR
INR: 2.02 — ABNORMAL HIGH (ref 0.00–1.49)
Prothrombin Time: 22.2 seconds — ABNORMAL HIGH (ref 11.6–15.2)

## 2013-12-22 MED ORDER — LINAGLIPTIN 5 MG PO TABS
5.0000 mg | ORAL_TABLET | Freq: Every day | ORAL | Status: DC
Start: 2013-12-22 — End: 2014-01-03
  Administered 2013-12-23 – 2014-01-03 (×12): 5 mg via ORAL
  Filled 2013-12-22 (×14): qty 1

## 2013-12-22 MED ORDER — TAMSULOSIN HCL 0.4 MG PO CAPS
0.4000 mg | ORAL_CAPSULE | Freq: Every day | ORAL | Status: DC
  Administered 2013-12-23 – 2013-12-30 (×9): 0.4 mg via ORAL
  Filled 2013-12-22 (×10): qty 1

## 2013-12-22 MED ORDER — FUROSEMIDE 80 MG PO TABS
80.0000 mg | ORAL_TABLET | Freq: Two times a day (BID) | ORAL | Status: DC
  Administered 2013-12-23 – 2013-12-24 (×4): 80 mg via ORAL
  Filled 2013-12-22 (×5): qty 1

## 2013-12-22 MED ORDER — WARFARIN SODIUM 5 MG PO TABS
5.0000 mg | ORAL_TABLET | Freq: Once | ORAL | Status: AC
  Administered 2013-12-22: 5 mg via ORAL
  Filled 2013-12-22: qty 1

## 2013-12-22 MED ORDER — INSULIN ASPART 100 UNIT/ML ~~LOC~~ SOLN
5.0000 [IU] | Freq: Three times a day (TID) | SUBCUTANEOUS | Status: DC
  Administered 2013-12-22 – 2013-12-29 (×20): 5 [IU] via SUBCUTANEOUS

## 2013-12-22 NOTE — Progress Notes (Signed)
Physical Therapy Session Note  Patient Details  Name: [name] [name] MRN: [id number] Date of Birth: 1931/05/17  Today's Date: 12/22/2013 Time: 0905-1000 and [id number] Time Calculation (min): 55 min and 25 min  Short Term Goals: Week 1:  PT Short Term Goal 1 (Week 1): Patient will perform supine <> sit with supervision PT Short Term Goal 2 (Week 1): Patient will perform sit to stand with supervision PT Short Term Goal 3 (Week 1): Patient will ambulate 50 feet with [name], RW, and min steady assist PT Short Term Goal 4 (Week 1): Patient will ambulate up and down 4 steps with 1 rail and mod assist  Skilled Therapeutic Interventions/Progress Updates:   Upon arriving pt in w/c.  Discussed with pt PLOF, previous hospitalization and stay at SNF PTA, D/C plan, fit of [name] and f/u with [employer] regarding adjustment of [name].  [employer] contacted, awaiting return call from P&O to schedule time to consult and possibly adjust fit.  Pt reporting urge to urinate.  Performed transfer w/c <> toilet stand pivot with UE support on grab bar and mod A for lifting assistance and stabilization of LLE during pivot.  Pt able to maintain standing with UE support on grab bar but required assistance of therapist for clothing doffing and donning.  Pt unable to urinate on toilet.  Returned to w/c and transported to gym in w/c.  Pt transferred w/c <> Nustep squat pivot mod A.  Performed bilat UE and LE general endurance and strengthening x 4 min + 4 min more at level 4 resistance with one rest break to assess HR and Sp02 and for pursed lip breathing.  Returned to w/c and transitioned to NMR in hallway with UE support on rail.  Once pt fatigued returned to w/c and to room to rest before OT session.    Second Session: P&O came earlier and added lambswool to inside of upper strap on [name] to accommodate extra space and provide extra stability pre-tibial area and added slight DF for increased knee control.  Pt transferred to gym in w/c total A.   Performed sit > stand from w/c with mod-max A this pm secondary to fatigue.  Performed gait with [name] and RW x 6' with min-mod A with improved lateral and weight bearing through LLE during stance but still presents with decreased step length RLE.  When cued to increase step length RLE pt reporting fatigue and requesting to sit.  Allowed pt prolonged rest time in w/c and discussed with pt how he performed stairs at home; pt able to verbalize safe stepping sequence.  Pt agreeable to attempting stairs.  Pt ascended 3 short stairs with bilat rails with min-mod A with assistance to fully clear and advance LLE to next step with step to sequencing.  When descending pt RLE began to give out and he required +2 to stabilize and descend safely with step to sequence.  Pt collapsed in chair at bottom of stairs.  Returned to room and set up with all items needed within reach.    Therapy Documentation Precautions:  Precautions Precautions: Fall Precaution Comments: chronic left hemiplegia Required Braces or Orthoses: Other Brace/Splint Other Brace/Splint: patient's wife bringing [name] Restrictions Weight Bearing Restrictions: No Vital Signs: Oxygen Therapy SpO2: 95 % O2 Device: None (Room air) Pulse Oximetry Type: Intermittent Pain: Pain Assessment Pain Assessment: No/denies pain Other Treatments: Treatments Neuromuscular Facilitation: Left;Lower Extremity;Right;Activity to increase sustained activation;Activity to increase lateral weight shifting;Activity to increase coordination;Activity to increase motor control;Activity to increase timing and  sequencing in standing with bilat UE support on wall rail while performing L and R lateral stepping x 10' each direction with mod A for full lateral weight shift and verbal and tactile cues for sequencing of LLE hip and knee extension in stance for stability in order to advance RLE.   Pt fatigued quickly and requested to return to w/c.    See FIM for current functional  status  Therapy/Group: Individual Therapy  [facility] [address] [facility] 12/22/2013, 10:25 AM

## 2013-12-22 NOTE — Progress Notes (Signed)
Subjective/Complaints: 80 y.o. male with history of COPD--O2 at nights, h/o CVA with L-HP and memory deficits, chronic CHF, A fib with RVR as well as recent admission for ischemic RLE requiring R- femoral embolectomy due to embolic event. [name] on 12/10/13 with worsening of dyspnea, anasarca, acute renal failure and left foot pain. He was treated for acute on chronic renal failure with IV diuresis and started on antibiotics for AECOPD v/s viral bronchitis. Required BIPAP due to mucous plugging. Treated with nebs and steroids and respiratory status improving. He developed acute epistaxis on 01/02/124 treated with chemical cautery and nasal packing by Dr. Wilburn Cornelia with recommendations for continued antibiotic coverage. Follow up doppler with thrombus right posterior tibial vein and no DVT LLE  No problems overnite Patient states that physical therapy was tiring yesterday Objective: Vital Signs: Blood pressure 118/78, pulse 78, temperature 97.3 F (36.3 C), temperature source Oral, resp. rate 18, height 5' 7"  (1.702 m), weight 76.2 kg (167 lb 15.9 oz), SpO2 94.00%. Dg Chest 2 View  12/21/2013   CLINICAL DATA:  Pneumonia  EXAM: CHEST  2 VIEW  COMPARISON:  12/18/2003  FINDINGS: Mild improvement in right lower lobe airspace disease. Underlying COPD is suspected. There is cardiac enlargement without heart failure or effusion.  IMPRESSION: Slight improvement in right lower lobe infiltrate, consistent with pneumonia.   Electronically Signed   By: Franchot Gallo M.D.   On: 12/21/2013 08:43   Results for orders placed during the hospital encounter of 12/20/13 (from the past 72 hour(s))  GLUCOSE, CAPILLARY     Status: Abnormal   Collection Time    12/20/13  9:37 PM      Result Value Range   Glucose-Capillary 306 (*) 70 - 99 mg/dL   Comment 1 Notify RN    URINE CULTURE     Status: None   Collection Time    12/20/13 11:34 PM      Result Value Range   Specimen Description URINE, CLEAN CATCH     Special Requests levaquin     Culture  Setup Time       Value: 12/21/2013 09:28     Performed at [facility]       Value: NO GROWTH     Performed at [facility]   Culture       Value: NO GROWTH     Performed at [facility]   Report Status 12/22/2013 FINAL    CBC WITH DIFFERENTIAL     Status: Abnormal   Collection Time    12/21/13  6:04 AM      Result Value Range   WBC 15.3 (*) 4.0 - 10.5 K/uL   RBC 4.57  4.22 - 5.81 MIL/uL   Hemoglobin 14.1  13.0 - 17.0 g/dL   HCT 41.8  39.0 - 52.0 %   MCV 91.5  78.0 - 100.0 fL   MCH 30.9  26.0 - 34.0 pg   MCHC 33.7  30.0 - 36.0 g/dL   RDW 14.9  11.5 - 15.5 %   Platelets 230  150 - 400 K/uL   Neutrophils Relative % 77  43 - 77 %   Neutro Abs 11.7 (*) 1.7 - 7.7 K/uL   Lymphocytes Relative 16  12 - 46 %   Lymphs Abs 2.4  0.7 - 4.0 K/uL   Monocytes Relative 8  3 - 12 %   Monocytes Absolute 1.2 (*) 0.1 - 1.0 K/uL   Eosinophils Relative  0  0 - 5 %   Eosinophils Absolute 0.0  0.0 - 0.7 K/uL   Basophils Relative 0  0 - 1 %   Basophils Absolute 0.0  0.0 - 0.1 K/uL  COMPREHENSIVE METABOLIC PANEL     Status: Abnormal   Collection Time    12/21/13  6:04 AM      Result Value Range   Sodium 134 (*) 137 - 147 mEq/L   Potassium 4.2  3.7 - 5.3 mEq/L   Chloride 90 (*) 96 - 112 mEq/L   CO2 31  19 - 32 mEq/L   Glucose, Bld 239 (*) 70 - 99 mg/dL   BUN 41 (*) 6 - 23 mg/dL   Creatinine, Ser 1.32  0.50 - 1.35 mg/dL   Calcium 10.1  8.4 - 10.5 mg/dL   Total Protein 6.9  6.0 - 8.3 g/dL   Albumin 3.5  3.5 - 5.2 g/dL   AST 39 (*) 0 - 37 U/L   ALT 71 (*) 0 - 53 U/L   Alkaline Phosphatase 67  39 - 117 U/L   Total Bilirubin 1.0  0.3 - 1.2 mg/dL   GFR calc non Af Amer 49 (*) >90 mL/min   GFR calc Af Amer 57 (*) >90 mL/min   Comment: (NOTE)     The eGFR has been calculated using the CKD EPI equation.     This calculation has not been validated in all clinical situations.     eGFR's persistently <90 mL/min signify  possible Chronic Kidney     Disease.  PROTIME-INR     Status: Abnormal   Collection Time    12/21/13  6:04 AM      Result Value Range   Prothrombin Time 20.1 (*) 11.6 - 15.2 seconds   INR 1.77 (*) 0.00 - 1.49  GLUCOSE, CAPILLARY     Status: Abnormal   Collection Time    12/21/13  7:32 AM      Result Value Range   Glucose-Capillary 227 (*) 70 - 99 mg/dL   Comment 1 Notify RN    GLUCOSE, CAPILLARY     Status: Abnormal   Collection Time    12/21/13 11:31 AM      Result Value Range   Glucose-Capillary 277 (*) 70 - 99 mg/dL   Comment 1 Notify RN    GLUCOSE, CAPILLARY     Status: Abnormal   Collection Time    12/21/13  4:58 PM      Result Value Range   Glucose-Capillary 207 (*) 70 - 99 mg/dL   Comment 1 Notify RN    GLUCOSE, CAPILLARY     Status: Abnormal   Collection Time    12/21/13  8:28 PM      Result Value Range   Glucose-Capillary 260 (*) 70 - 99 mg/dL   Comment 1 Notify RN    BASIC METABOLIC PANEL     Status: Abnormal   Collection Time    12/22/13  6:55 AM      Result Value Range   Sodium 133 (*) 137 - 147 mEq/L   Potassium 4.2  3.7 - 5.3 mEq/L   Chloride 92 (*) 96 - 112 mEq/L   CO2 27  19 - 32 mEq/L   Glucose, Bld 252 (*) 70 - 99 mg/dL   BUN 47 (*) 6 - 23 mg/dL   Creatinine, Ser 1.48 (*) 0.50 - 1.35 mg/dL   Calcium 9.5  8.4 - 10.5 mg/dL   GFR calc non Af  Amer 43 (*) >90 mL/min   GFR calc Af Amer 50 (*) >90 mL/min   Comment: (NOTE)     The eGFR has been calculated using the CKD EPI equation.     This calculation has not been validated in all clinical situations.     eGFR's persistently <90 mL/min signify possible Chronic Kidney     Disease.  PROTIME-INR     Status: Abnormal   Collection Time    12/22/13  6:55 AM      Result Value Range   Prothrombin Time 22.2 (*) 11.6 - 15.2 seconds   INR 2.02 (*) 0.00 - 1.49  GLUCOSE, CAPILLARY     Status: Abnormal   Collection Time    12/22/13  7:02 AM      Result Value Range   Glucose-Capillary 240 (*) 70 - 99 mg/dL    Comment 1 Notify RN       HEENT: normal, no evidence of epistaxis Cardio: irregular and no murmurs, rate controlled Resp: CTA B/L and unlabored GI: BS positive and non distended Extremity:  Pulses absent Bilat pedal and R pop and No Edema Skin:   Intact Neuro: Alert/Oriented, Cranial Nerve Abnormalities L central 7, Normal Sensory, Abnormal Motor 3/5 L deltoid, bi, tri, grip,L quad, HF 2- ankle DF,  and Abnormal FMC Ataxic/ dec FMC Musc/Skel:  Other no pain with UE and LE ROM Gen NAD   Assessment/Plan: 1. Functional deficits secondary to Deconditioning respiratory and renal failure as well as chronic Left Hemiparesis from CVA 52yr ago which require 3+ hours per day of interdisciplinary therapy in a comprehensive inpatient rehab setting. Physiatrist is providing close team supervision and 24 hour management of active medical problems listed below. Physiatrist and rehab team continue to assess barriers to discharge/monitor patient progress toward functional and medical goals. FIM: FIM - Bathing Bathing Steps Patient Completed: Chest;Right Arm;Left Arm;Abdomen;Right upper leg;Left upper leg;Front perineal area Bathing: 3: Mod-Patient completes 5-7 [id number] parts or 50-74%  FIM - Upper Body Dressing/Undressing Upper body dressing/undressing steps patient completed: Thread/unthread right sleeve of pullover shirt/dresss;Thread/unthread left sleeve of pullover shirt/dress;Put head through opening of pull over shirt/dress Upper body dressing/undressing: 4: Min-Patient completed 75 plus % of tasks FIM - Lower Body Dressing/Undressing Lower body dressing/undressing steps patient completed:  (wife is bringing clothes later this morning) Lower body dressing/undressing: 1: Total-Patient completed less than 25% of tasks  FIM - Toileting Toileting: 0: Activity did not occur (declined need)  FIM - ToAir cabin crewransfers: 0-Activity did not occur  FIM - BeShip brokerevices: HOB elevated;Bed rails;Arm rests Bed/Chair Transfer: 3: Supine > Sit: Mod A (lifting assist/Pt. 50-74%/lift 2 legs;3: Bed > Chair or W/C: Mod A (lift or lower assist)  FIM - Locomotion: Wheelchair Locomotion: Wheelchair: 1: Total Assistance/staff pushes wheelchair (Pt<25%) FIM - Locomotion: Ambulation Locomotion: Ambulation Assistive Devices: WaAdministratormbulation/Gait Assistance: 3: Mod assist Locomotion: Ambulation: 1: Travels less than 50 ft with moderate assistance (Pt: 50 - 74%)  Comprehension Comprehension Mode: Auditory Comprehension: 5-Follows basic conversation/direction: With extra time/assistive device  Expression Expression Mode: Verbal Expression: 5-Expresses basic needs/ideas: With extra time/assistive device  Social Interaction Social Interaction: 4-Interacts appropriately 75 - 89% of the time - Needs redirection for appropriate language or to initiate interaction.  Problem Solving Problem Solving: 4-Solves basic 75 - 89% of the time/requires cueing 10 - 24% of the time  Memory Memory: 4-Recognizes or recalls 75 - 89% of the time/requires cueing 10 - 24%  of the time  Medical Problem List and Plan:  Deconditioning related acute renal failure, hypoxia, and multiple medical issues  1. RLE DVT/A fib/Anticoagulation: Pharmaceutical: Coumadin and Lovenox  2. Pain Management: prn tylenol    3. Mood: Will have LCSW follow for evaluation.  4. Neuropsych: This patient is capable of making decisions on his own behalf.  5. Epistaxis: Will monitor for now--may have to discuss risks v/s benefits if bleeding worsens/recurs as INR rises to therapeutic levels. Nasal packing remains in place. Antibiotic coverage while packing in place.  6. Question multilobar PNA: Will check follow CXR. Antibiotic will do 14 day course. Has persistent cough  7. AECOPD: Respiratory status improving and slow taper of steroids. Continue nebs and IS.  8. Acute on chronic  CHF: Compensated--continue low salt diet with 1500 cc fluid restriction. On lasix 80 mg bid. Check daily weights. Daily monitoring of I's and O's. Appreciate cardiology note 9. A fib: monitor HR with bid checks. Continue diltiazem 240 mg daily.  10. GU: Foley d/c after rehab admit. Has frequency and urgency due to diuretics. . Toilet patient as unable to use urinal and needs to stand to empty. Condom cath at nights. Will check PVRs, requiring I/O caths   LOS (Days) 2 A FACE TO FACE EVALUATION WAS PERFORMED  [name] [name] 12/22/2013, 10:15 AM

## 2013-12-22 NOTE — Progress Notes (Signed)
Patient ID: [name] [name], male   DOB: 1931-01-30, 80 y.o.   MRN: [id number]   SUBJECTIVE:   81 y/o male admitted with a/c respiratory failure in the setting of acute bronchitis and a/c diastolic HF. Continues to improve on CIR. Doing great. Weight up 2 lbs, 24 hr I/O +560. Denies SOB, orthopnea or CP. No longer having epistaxis.   Marland Kitchen antiseptic oral rinse  15 mL Mouth Rinse q12n4p  . atorvastatin  10 mg Oral Daily  . diltiazem  240 mg Oral Daily  . famotidine  20 mg Oral QHS  . furosemide  80 mg Oral BID  . guaiFENesin  600 mg Oral BID  . insulin aspart  0-9 Units Subcutaneous TID WC  . levofloxacin  750 mg Oral Q48H  . multivitamin with minerals  1 tablet Oral Daily  . oxymetazoline  1 spray Each Nare BID  . potassium chloride  40 mEq Oral Daily  . predniSONE  10 mg Oral Q breakfast  . sodium chloride  4 spray Each Nare 6 X Daily  . warfarin  5 mg Oral ONCE-1800  . Warfarin - Pharmacist Dosing Inpatient   Does not apply q1800    Filed Vitals:   12/21/13 2050 12/22/13 0401 12/22/13 0500 12/22/13 1024  BP:  118/78    Pulse:  78    Temp:  97.3 F (36.3 C)    TempSrc:  Oral    Resp:  18    Height:      Weight:   167 lb 15.9 oz (76.2 kg)   SpO2: 95% 94%  95%    Intake/Output Summary (Last 24 hours) at 12/22/13 1253 Last data filed at 12/22/13 0800  Gross per 24 hour  Intake    960 ml  Output    400 ml  Net    560 ml    LABS: Basic Metabolic Panel:  Recent Labs  40/98/11 0604 12/22/13 0655  NA 134* 133*  K 4.2 4.2  CL 90* 92*  CO2 31 27  GLUCOSE 239* 252*  BUN 41* 47*  CREATININE 1.32 1.48*  CALCIUM 10.1 9.5   Liver Function Tests:  Recent Labs  12/21/13 0604  AST 39*  ALT 71*  ALKPHOS 67  BILITOT 1.0  PROT 6.9  ALBUMIN 3.5   No results found for this basename: LIPASE, AMYLASE,  in the last 72 hours CBC:  Recent Labs  12/21/13 0604  WBC 15.3*  NEUTROABS 11.7*  HGB 14.1  HCT 41.8  MCV 91.5  PLT 230   Cardiac Enzymes: No results found for  this basename: CKTOTAL, CKMB, CKMBINDEX, TROPONINI,  in the last 72 hours BNP: No components found with this basename: POCBNP,  D-Dimer: No results found for this basename: DDIMER,  in the last 72 hours Hemoglobin A1C: No results found for this basename: HGBA1C,  in the last 72 hours Fasting Lipid Panel: No results found for this basename: CHOL, HDL, LDLCALC, TRIG, CHOLHDL, LDLDIRECT,  in the last 72 hours Thyroid Function Tests: No results found for this basename: TSH, T4TOTAL, FREET3, T3FREE, THYROIDAB,  in the last 72 hours Anemia Panel: No results found for this basename: VITAMINB12, FOLATE, FERRITIN, TIBC, IRON, RETICCTPCT,  in the last 72 hours  PHYSICAL EXAM General: NAD. Elderly. Sitting in chair; wife present Neck: JVP flat no thyromegaly or thyroid nodule.  Lungs: Mild rhonchi CV: Nondisplaced PMI. Distant HS.Marland Kitchen Heart irregular S1/S2, no S3/S4, no murmur. NO edema   Abdomen: Soft, nontender, no hepatosplenomegaly, no distention.  Neurologic: Alert and oriented x 3. LUE weakness and swelling;  Psych: Normal affect. Extremities: No clubbing or cyanosis.    ASSESSMENT AND PLAN: 80 yo with history of chronic atrial fibrillation, COPD, and diastolic CHF admitted with respiratory failure, suspect combination of acute/chronic diastolic CHF and acute exacerbation of COPD.   1. Pulmonary: Suspect acute exacerbation COPD, possible viral bronchitis as instigator.   2. Acute on chronic diastolic CHF: Volume status stable continue PO lasix BID. Slight bump in Cr. Will hold afternoon dose of lasix. Continue to follow Cr closely.  3. Atrial fibrillation with RVR: HR stable, on diltiazem CD. On coumadin, INR therapeutic. 4. Acute on chronic kidney injury: Slight bump in Cr. As above hold afternoon dose of lasix and continue to follow.  5. Epistaxis: Packing removed 1/8 by ENT. No bleeding overnight. He has acute RLE DVT in posterior tibial vein.  INR therapeutic.   Continues to improve. As  above will hold afternoon dose of lasix. Continue to work with CIR. Over the weekend continue to follow Cr closely, if continues to increase may need to cut back daily dose to 80 mg q am 40 mg q pm.   [name], [name], NP-C 12:53 PM  Patient seen and examined with Ulla PotashAli Cosgrove, NP. We discussed all aspects of the encounter. I agree with the assessment and plan as stated above.   Doing well. Agree with holding one dose of lasix. Has been on 80 bid at home so suspect he may be ok on this dose but will watch renal function closely and adjust lasix as needed. He is stable on warfarin for acute DVT (could not afford NOAC) - no further epistaxis. Continue to work with CIR.   Daniel Bensimhon,MD 7:17 PM  \

## 2013-12-22 NOTE — Progress Notes (Signed)
Occupational Therapy Session Note  Patient Details  Name: [name] [name] MRN: [id number] Date of Birth: 10-20-1931  Today's Date: 12/22/2013 Time: 0730-0827 and 1100-1145 Time Calculation (min): 57 min and 45 min  Short Term Goals: Week 1:  OT Short Term Goal 1 (Week 1): Patient will transfer to commode with mod assist OT Short Term Goal 2 (Week 1): Patient will wash lower body with mod assist OT Short Term Goal 3 (Week 1): Patient will dress lower body with max assist OT Short Term Goal 4 (Week 1): Patient will stand at sink for hygiene task with supervision  Skilled Therapeutic Interventions/Progress Updates:    1) Pt seen for ADL retraining with focus on transfers, sit <> stand, and standing balance during self-care tasks of bathing and dressing.  Pt completed stand pivot transfer bed > w/c with max assist.  Requested to bathe at shower level, performed stand pivot w/c > tub bench in room shower with mod assist with pt demonstrating increased lift off with use of UE support of grab bar.  Sit> stand with UE support min/steady assist and this therapist washed buttocks while pt maintained static standing balance.  Completed dressing at sink with pt requiring total assist for LB dressing, with pt reporting that wife would assist with shoes, socks, and threading pants.    2) Pt seen for 1:1 OT with focus on sit <> stand, transfers, and forward weight shifting to increase participation in LB bathing and dressing tasks.  Performed stand pivot transfer bed > w/c with max assist with pt able to sequence transfer with min cues.  Stand pivot transfer w/c > therapy mat in gym with increased difficulty with pt reporting increased fatigue from prior sessions.  Engaged in forward weight shifting task with focus on trunk control and activity tolerance with pt reaching outside BOS to retrieve ring and then bend forward to place item on target at feet to focus on reaching towards feet to increase participation in LB  dressing.  Pt with difficulty maintaining sitting balance when reaching forward, requiring stabilization at thigh and hips.  Pt reports some sinus pressure in head when bending forward, but reports it does not happen every time and is fleeting.  Pt returned to w/c stand pivot max assist and returned to room with wife present.  Therapy Documentation Precautions:  Precautions Precautions: Fall Precaution Comments: chronic left hemiplegia Required Braces or Orthoses: Other Brace/Splint Other Brace/Splint: patient's wife bringing [name] Restrictions Weight Bearing Restrictions: No Pain:  Pt with no c/o pain   See FIM for current functional status  Therapy/Group: Individual Therapy  [name] 12/22/2013, 8:30 AM

## 2013-12-22 NOTE — Progress Notes (Signed)
Patient continues to be incontinent with decreased urge to void. Urinary retention ongoing with post void volumes 400-600 cc. Worsening of renal status likely due to retention as well as diuresis. Will continue PVR checks 6-8 hours. UCS negative. Will add flomax to help voiding. Also noted to have elevated BS--off  Janumet. Will resume Januvia and hold metformin due to renal insufficiency.  Use SSI for elevated BS. Will add meal coverage for now.

## 2013-12-22 NOTE — IPOC Note (Signed)
Overall Plan of Care [facility](IPOC) Patient Details Name: [name] [name] MRN: [id number] DOB: Aug 29, 1931  Admitting Diagnosis: DECONDITIONED  Hospital Problems: Active Problems:   Physical deconditioning     Functional Problem List: Nursing Bladder;Bowel;Medication Management;Skin Integrity;Pain  PT Balance;Motor  OT Balance;Cognition;Endurance;Motor  SLP    TR         Basic ADL's: OT Eating;Grooming;Bathing;Dressing;Toileting     Advanced  ADL's: OT       Transfers: PT Bed Mobility;Bed to Chair;Car  OT Toilet;Tub/Shower     Locomotion: PT Ambulation;Stairs     Additional Impairments: OT    SLP        TR      Anticipated Outcomes Item Anticipated Outcome  Self Feeding independent  Swallowing      Basic self-care  minimal assistance  Toileting  supervision   Bathroom Transfers supervision  Bowel/Bladder  Min assist  Transfers  supervision  Locomotion  supervision household distances with RW; min assist with stairs  Communication     Cognition     Pain  Maintain pain at or less than 3  Safety/Judgment  Min assist   Therapy Plan: PT Intensity: Minimum of 1-2 x/day ,45 to 90 minutes PT Frequency: 5 out of 7 days PT Duration Estimated Length of Stay: 10 days OT Intensity: Minimum of 1-2 x/day, 45 to 90 minutes OT Frequency: 5 out of 7 days OT Duration/Estimated Length of Stay: 10-12 days         Team Interventions: Nursing Interventions Patient/Family Education;Pain Management;Bladder Management;Bowel Management;Medication Management;Skin Care/Wound Management  PT interventions Ambulation/gait training;Balance/vestibular training;Discharge planning;Functional mobility training;Patient/family education;Stair training;Therapeutic Activities;Therapeutic Exercise;UE/LE Strength taining/ROM;UE/LE Coordination activities  OT Interventions Balance/vestibular training;Patient/family education;Self Care/advanced ADL retraining;Therapeutic Exercise;UE/LE  Coordination activities;UE/LE Strength taining/ROM;Therapeutic Activities;Functional mobility training;DME/adaptive equipment instruction;Discharge planning  SLP Interventions    TR Interventions    SW/CM Interventions Discharge Planning;Psychosocial Support;Patient/Family Education    Team Discharge Planning: Destination: PT-Home ,OT- Home , SLP-  Projected Follow-up: PT-Home health PT, OT-  Home health OT, SLP-  Projected Equipment Needs: PT-To be determined, OT- To be determined, SLP-  Equipment Details: PT-patient has rollator and cane, OT-  Patient/family involved in discharge planning: PT- Patient,  OT-Patient, SLP-   MD ELOS: 10-12 days Medical Rehab Prognosis:  Good Assessment: 80 y.o. male with history of COPD--O2 at nights, h/o CVA with L-HP and memory deficits, chronic CHF, A fib with RVR as well as recent admission for ischemic RLE requiring R- femoral embolectomy due to embolic event. [name] on 12/10/13 with worsening of dyspnea, anasarca, acute renal failure and left foot pain. He was treated for acute on chronic renal failure with IV diuresis and started on antibiotics for AECOPD v/s viral bronchitis. Required BIPAP due to mucous plugging. Treated with nebs and steroids and respiratory status improving. He developed acute epistaxis on 01/02/124 treated with chemical cautery and nasal packing by Dr. Annalee GentaShoemaker with recommendations for continued antibiotic coverage. Follow up doppler with thrombus right posterior tibial vein and no DVT LLE.  Now requiring 24/7 Rehab RN,MD, as well as CIR level PT, OT and SLP.  Treatment team will focus on ADLs and mobility with goals set at sup/minA   See Team Conference Notes for weekly updates to the plan of care

## 2013-12-22 NOTE — Progress Notes (Signed)
ANTICOAGULATION CONSULT NOTE - Follow-up  Pharmacy Consult for warfarin  Indication: atrial fibrillation  No Known Allergies  Patient Measurements: Height: 5\' 7"  (170.2 cm) Weight: 167 lb 15.9 oz (76.2 kg) IBW/kg (Calculated) : 66.1  Vital Signs: Temp: 97.3 F (36.3 C) (01/09 0401) Temp src: Oral (01/09 0401) BP: 118/78 mmHg (01/09 0401) Pulse Rate: 78 (01/09 0401)  Labs:  Recent Labs  12/20/13 0315 12/21/13 0604 12/22/13 0655  HGB  --  14.1  --   HCT  --  41.8  --   PLT  --  230  --   LABPROT 16.1* 20.1* 22.2*  INR 1.32 1.77* 2.02*  CREATININE 1.30 1.32 1.48*    Estimated Creatinine Clearance: 37.2 ml/min (by C-G formula based on Cr of 1.48).  Assessment: [name] [name] admitted with PNA treated with ABX, HFpEF with volume overload and Afib with CVR on diltiazem.  He was continued on warfarin for afib but developed epistaxis - his nose was packed by ENT and warfarin was held. Epistaxis resolved, INR 1.53 and new RLE DVT found 1/4.  Decision was made to restart anticoagulation and monitior closely.    Warfarin PTA 2.5mg  daily - previously on amio - stopped day of last d/c 12/05/13  1/9 INR now within therapeutic range. Lovenox was d/c'd last night.  Also on levaquin day # 11 for HCAP.  Creat 1.48.  Last WBC 15.3.   Goal of Therapy:  INR 2-3 Monitor platelets by anticoagulation protocol: Yes  Plan:  Repeat Coumadin 5 mg x 1 Daily PT/INR Monitor s/s bleeding  Tad MooreJessica Teeghan Hammer, Pharm D, BCPS  Clinical Pharmacist Pager [phone]  12/22/2013 10:59 AM

## 2013-12-22 NOTE — Progress Notes (Signed)
INITIAL NUTRITION ASSESSMENT  DOCUMENTATION CODES Per approved criteria  -Non-severe (moderate) malnutrition in the context of chronic illness   INTERVENTION: 1.  General healthful diet; encourage intake of foods and beverages as able.  RD to follow and assess for nutritional adequacy.  2.  MVI daily  NUTRITION DIAGNOSIS: Unintentional weight loss related to CHF as evidenced by >12% weight loss in less than one month.   Goal: Pt to meet >/= 90% of their estimated nutrition needs   Monitor:  PO intake Weight trends Labs  Reason for Assessment: Malnutrition Screening Tool  80 y.o. male  Admitting Dx: physical deconditioning  ASSESSMENT: 80 y.o. male with history of CAD, PVD, atrial fibrillation on Coumadin, hypertension, DM 2, former smoker, CVA with residual left hemiparesis, was recently hospitalized at [facility] between 11/23/2013-12/05/2013 for ischemic right leg. Cardiology had consulted for A. Fib & acute on chronic diastolic CHF. He was discharged to [facility] in [place]. He presented to the [facility] ED with complaints of worsening dyspnea of 2 days' duration, worsening generalized body swelling, no chest pain or cough and left foot pain.  Pt recently reported that he usually weighs 200 lbs; now 167 lbs. Admission weigh was 216 lbs. His appetite is good and he is eating 100% of 3 meals daily.  Pt has had a significant amount of fluid loss with resolve of edema which may be responsible for the majority of his wt loss especially in context of adequate PO.   Pt dx with moderate malnutrition of chronic illness by RD (1/7) which is likely ongoing.  Height: Ht Readings from Last 1 Encounters:  12/20/13 5\' 7"  (1.702 m)    Weight: Wt Readings from Last 1 Encounters:  12/22/13 167 lb 15.9 oz (76.2 kg)    Ideal Body Weight: 178 lbs  % Ideal Body Weight: 98%  Wt Readings from Last 10 Encounters:  12/22/13 167 lb 15.9 oz (76.2 kg)  12/20/13 175 lb 7.8  oz (79.6 kg)  12/02/13 216 lb 0.8 oz (98 kg)  12/02/13 216 lb 0.8 oz (98 kg)    Usual Body Weight: 200 lbs  % Usual Body Weight: 83%  BMI:  Body mass index is 26.3 kg/(m^2).  Estimated Nutritional Needs: Kcal: 1900-2100 Protein: 95-105 grams Fluid: 2.3 L/day  Skin: +1 generalized edema  Diet Order: Carb Control  EDUCATION NEEDS: -No education needs identified at this time   Intake/Output Summary (Last 24 hours) at 12/22/13 1332 Last data filed at 12/22/13 0800  Gross per 24 hour  Intake    720 ml  Output    400 ml  Net    320 ml    Last BM: 1/7  Labs:   Recent Labs Lab 12/20/13 0315 12/21/13 0604 12/22/13 0655  NA 132* 134* 133*  K 4.5 4.2 4.2  CL 88* 90* 92*  CO2 28 31 27   BUN 40* 41* 47*  CREATININE 1.30 1.32 1.48*  CALCIUM 10.3 10.1 9.5  GLUCOSE 306* 239* 252*    CBG (last 3)   Recent Labs  12/21/13 2028 12/22/13 0702 12/22/13 1136  GLUCAP 260* 240* 222*    Scheduled Meds: . antiseptic oral rinse  15 mL Mouth Rinse q12n4p  . atorvastatin  10 mg Oral Daily  . diltiazem  240 mg Oral Daily  . famotidine  20 mg Oral QHS  . [START ON 12/23/2013] furosemide  80 mg Oral BID  . guaiFENesin  600 mg Oral BID  . insulin aspart  0-9  Units Subcutaneous TID WC  . levofloxacin  750 mg Oral Q48H  . multivitamin with minerals  1 tablet Oral Daily  . oxymetazoline  1 spray Each Nare BID  . potassium chloride  40 mEq Oral Daily  . predniSONE  10 mg Oral Q breakfast  . sodium chloride  4 spray Each Nare 6 X Daily  . warfarin  5 mg Oral ONCE-1800  . Warfarin - Pharmacist Dosing Inpatient   Does not apply q1800    Continuous Infusions:   Past Medical History  Diagnosis Date  . Hypertension   . Stroke   . COPD (chronic obstructive pulmonary disease)   . Diabetes mellitus without complication   . GERD (gastroesophageal reflux disease)   . A-fib     Past Surgical History  Procedure Laterality Date  . Carotid stent    . Parotid gland tumor  excision    . Embolectomy Right 11/23/2013    Procedure: Right Femoral EMBOLECTOMY;  Surgeon: Chuck Hinthristopher S Dickson, MD;  [facility];  Service: Vascular;  Laterality: Right;  Right Femoral Embolectomy with Bovine Paricardium patch angioplasty.    Loyce DysKacie Jalyn Dutta, MS RD LDN Clinical Inpatient Dietitian Pager: [phone] Weekend/After hours pager: [phone]

## 2013-12-23 ENCOUNTER — Inpatient Hospital Stay (HOSPITAL_COMMUNITY): Payer: Medicare Other | Admitting: Physical Therapy

## 2013-12-23 ENCOUNTER — Inpatient Hospital Stay (HOSPITAL_COMMUNITY): Payer: Medicare HMO | Admitting: Occupational Therapy

## 2013-12-23 DIAGNOSIS — E118 Type 2 diabetes mellitus with unspecified complications: Secondary | ICD-10-CM

## 2013-12-23 DIAGNOSIS — I4891 Unspecified atrial fibrillation: Secondary | ICD-10-CM

## 2013-12-23 DIAGNOSIS — D649 Anemia, unspecified: Secondary | ICD-10-CM

## 2013-12-23 LAB — BASIC METABOLIC PANEL
BUN: 50 mg/dL — AB (ref 6–23)
CO2: 28 mEq/L (ref 19–32)
Calcium: 9.3 mg/dL (ref 8.4–10.5)
Chloride: 93 mEq/L — ABNORMAL LOW (ref 96–112)
Creatinine, Ser: 1.54 mg/dL — ABNORMAL HIGH (ref 0.50–1.35)
GFR calc Af Amer: 47 mL/min — ABNORMAL LOW (ref >90)
GFR calc non Af Amer: 41 mL/min — ABNORMAL LOW (ref >90)
GLUCOSE: 171 mg/dL — AB (ref 70–99)
Potassium: 4.7 mEq/L (ref 3.7–5.3)
Sodium: 133 mEq/L — ABNORMAL LOW (ref 137–147)

## 2013-12-23 LAB — GLUCOSE, CAPILLARY
GLUCOSE-CAPILLARY: 178 mg/dL — AB (ref 70–99)
Glucose-Capillary: 179 mg/dL — ABNORMAL HIGH (ref 70–99)
Glucose-Capillary: 212 mg/dL — ABNORMAL HIGH (ref 70–99)
Glucose-Capillary: 105 mg/dL — ABNORMAL HIGH (ref 70–99)

## 2013-12-23 LAB — PROTIME-INR
INR: 2.36 — ABNORMAL HIGH (ref 0.00–1.49)
Prothrombin Time: 25 seconds — ABNORMAL HIGH (ref 11.6–15.2)

## 2013-12-23 MED ORDER — WARFARIN SODIUM 4 MG PO TABS
4.0000 mg | ORAL_TABLET | Freq: Once | ORAL | Status: AC
  Administered 2013-12-23: 4 mg via ORAL
  Filled 2013-12-23: qty 1

## 2013-12-23 MED ORDER — LIDOCAINE HCL 2 % EX GEL
CUTANEOUS | Status: DC | PRN
  Administered 2013-12-23: 5 via URETHRAL
  Filled 2013-12-23: qty 5
  Filled 2013-12-23 (×2): qty 20

## 2013-12-23 NOTE — Progress Notes (Signed)
Physical Therapy Session Note  Patient Details  Name: [name] [name] MRN: [id number] Date of Birth: 1931-01-18  Today's Date: 12/23/2013 Time: [id number] Time Calculation (min): 25 min  Short Term Goals: Week 1:  PT Short Term Goal 1 (Week 1): Patient will perform supine <> sit with supervision PT Short Term Goal 2 (Week 1): Patient will perform sit to stand with supervision PT Short Term Goal 3 (Week 1): Patient will ambulate 50 feet with [name], RW, and min steady assist PT Short Term Goal 4 (Week 1): Patient will ambulate up and down 4 steps with 1 rail and mod assist  Skilled Therapeutic Interventions/Progress Updates:  Pt was seen bedside in the am, pt requesting to get out of bed. Pt rolled multiple times R and L to assist with dressing with S and side rail. Pt transferred supine to edge of bed with mod A and verbal cues for technique. Pt transferred sit to stand min A from edge of bed with rolling walker. Pt performed stand pivot transfer with mod A and rolling walker, verbal cues for technique. Donned L [name] prior to transfer.    Therapy Documentation Precautions:  Precautions Precautions: Fall Precaution Comments: chronic left hemiplegia Required Braces or Orthoses: Other Brace/Splint Other Brace/Splint: patient's wife bringing [name] Restrictions Weight Bearing Restrictions: No General:  Pain: No c/o pain.   Locomotion :  See FIM for current functional status  Therapy/Group: Individual Therapy  [name], Ami Mally G 12/23/2013, 10:53 AM

## 2013-12-23 NOTE — Progress Notes (Addendum)
[name] [name] is a 80 y.o. male [birth year] [id number]  Subjective: No new complaints. No new problems. Slept well. Feeling OK.  Objective: Vital signs in last 24 hours: Temp:  [96.9 F (36.1 C)-97.3 F (36.3 C)] 97.3 F (36.3 C) (01/10 0541) Pulse Rate:  [78-93] 78 (01/10 0437) Resp:  [16-18] 18 (01/10 0437) BP: (106-124)/(59-85) 106/59 mmHg (01/10 0437) SpO2:  [94 %-99 %] 99 % (01/10 0437) Weight:  [166 lb 7.2 oz (75.5 kg)] 166 lb 7.2 oz (75.5 kg) (01/10 0527) Weight change: -1 lb 8.7 oz (-0.7 kg) Last BM Date: 12/22/13  Intake/Output from previous day: 01/09 0701 - 01/10 0700 In: 720 [P.O.:720] Out: 2020 [Urine:2020] Last cbgs: CBG (last 3)   Recent Labs  12/22/13 1626 12/22/13 2055 12/23/13 0735  GLUCAP 337* 281* 179*     Physical Exam General: No apparent distress  Chronically ill appearing  HEENT: moist mucosa Lungs: Normal effort. Lungs clear to auscultation, no crackles or wheezes. Cardiovascular: Irreg rate and rhythm, no edema Abdomen: S/NT/ND; BS(+) Musculoskeletal:  No change from before Neurological: No new neurological deficits Wounds: N/A    Skin: clear Alert, cooperative   Lab Results: BMET    Component Value Date/Time   NA 133* 12/23/2013 0456   K 4.7 12/23/2013 0456   CL 93* 12/23/2013 0456   CO2 28 12/23/2013 0456   GLUCOSE 171* 12/23/2013 0456   BUN 50* 12/23/2013 0456   CREATININE 1.54* 12/23/2013 0456   CALCIUM 9.3 12/23/2013 0456   GFRNONAA 41* 12/23/2013 0456   GFRAA 47* 12/23/2013 0456   CBC    Component Value Date/Time   WBC 15.3* 12/21/2013 0604   RBC 4.57 12/21/2013 0604   HGB 14.1 12/21/2013 0604   HCT 41.8 12/21/2013 0604   PLT 230 12/21/2013 0604   MCV 91.5 12/21/2013 0604   MCH 30.9 12/21/2013 0604   MCHC 33.7 12/21/2013 0604   RDW 14.9 12/21/2013 0604   LYMPHSABS 2.4 12/21/2013 0604   MONOABS 1.2* 12/21/2013 0604   EOSABS 0.0 12/21/2013 0604   BASOSABS 0.0 12/21/2013 0604    Studies/Results: No results found.  Medications: I have reviewed  the patient's current medications.  Assessment/Plan:  Deconditioning related acute renal failure, hypoxia, and multiple medical issues  1. RLE DVT/A fib/Anticoagulation: Pharmaceutical: Coumadin and Lovenox  2. Pain Management: prn tylenol  3. Mood: Will have LCSW follow for evaluation.  4. Neuropsych: This patient is capable of making decisions on his own behalf.  5. Epistaxis: Will monitor for now--may have to discuss risks v/s benefits if bleeding worsens/recurs as INR rises to therapeutic levels. Nasal packing remains in place. Antibiotic coverage while packing in place.  6. Question multilobar PNA: Will check follow CXR. Antibiotic will do 14 day course. Has persistent cough  7. AECOPD: Respiratory status improving and slow taper of steroids. Continue nebs and IS.  8. Acute on chronic CHF: Compensated--continue low salt diet with 1500 cc fluid restriction. On lasix 80 mg bid. Check daily weights. Daily monitoring of I's and O's. Appreciate cardiology note  9. A fib: monitor HR with bid checks. Continue diltiazem 240 mg daily.  10. GU: Foley d/c after rehab admit. Has frequency and urgency due to diuretics. . Toilet patient as unable to use urinal and needs to stand to empty. Condom cath at nights. Will check PVRs, requiring I/O caths  Cont Rx     Length of stay, days: 3  Sonda PrimesAlex Lekesha Claw , MD 12/23/2013, 8:39 AM

## 2013-12-23 NOTE — Progress Notes (Signed)
At 10:45, pt had gone 6hrs without voiding. Unable to pass standard catheter for I&O, used coude catheter. Appeared very uncomfortable for pt, 425cc urine obtained. Requested order for lidocaine with caths. At 16:30, pt voided large unknown amount incontinent. PVR 450cc. I&O cath with coude/lidocaine, 400cc. Pt still experiencing a lot of discomfort with caths. Continue to monitor. Mick SellShannon Onedia Vargus RN

## 2013-12-23 NOTE — Progress Notes (Signed)
ANTICOAGULATION CONSULT NOTE - Follow Up Consult  Pharmacy Consult for Coumadin Indication: atrial fibrillation & RLE DVT  No Known Allergies  Patient Measurements: Height: 5\' 7"  (170.2 cm) Weight: 166 lb 7.2 oz (75.5 kg) IBW/kg (Calculated) : 66.1 Heparin Dosing Weight:   Vital Signs: Temp: 97.7 F (36.5 C) (01/10 1500) Temp src: Oral (01/10 1500) BP: 118/68 mmHg (01/10 1500) Pulse Rate: 80 (01/10 1500)  Labs:  Recent Labs  12/21/13 0604 12/22/13 0655 12/23/13 0456  HGB 14.1  --   --   HCT 41.8  --   --   PLT 230  --   --   LABPROT 20.1* 22.2* 25.0*  INR 1.77* 2.02* 2.36*  CREATININE 1.32 1.48* 1.54*    Estimated Creatinine Clearance: 35.8 ml/min (by C-G formula based on Cr of 1.54).   Medications:  Scheduled:  . antiseptic oral rinse  15 mL Mouth Rinse q12n4p  . atorvastatin  10 mg Oral Daily  . diltiazem  240 mg Oral Daily  . famotidine  20 mg Oral QHS  . furosemide  80 mg Oral BID  . guaiFENesin  600 mg Oral BID  . insulin aspart  0-9 Units Subcutaneous TID WC  . insulin aspart  5 Units Subcutaneous TID WC  . levofloxacin  750 mg Oral Q48H  . linagliptin  5 mg Oral Daily  . multivitamin with minerals  1 tablet Oral Daily  . oxymetazoline  1 spray Each Nare BID  . potassium chloride  40 mEq Oral Daily  . predniSONE  10 mg Oral Q breakfast  . sodium chloride  4 spray Each Nare 6 X Daily  . tamsulosin  0.4 mg Oral QPC supper  . Warfarin - Pharmacist Dosing Inpatient   Does not apply q1800    Assessment: 80yo male with AFib, new RLE DVT.   He has had some issues with epistaxis and nasal packing is in place.  No nose bleeds reported today.  INR 2.36.  Goal of Therapy:  INR 2-3 Monitor platelets by anticoagulation protocol: Yes   Plan:  1-  Coumadin 4mg  2-  Daily INR  Marisue HumbleKendra Kinneth Fujiwara, PharmD Clinical Pharmacist [facility]

## 2013-12-24 ENCOUNTER — Inpatient Hospital Stay (HOSPITAL_COMMUNITY): Payer: Medicare Other | Admitting: Occupational Therapy

## 2013-12-24 ENCOUNTER — Inpatient Hospital Stay (HOSPITAL_COMMUNITY): Payer: Medicare Other | Admitting: Physical Therapy

## 2013-12-24 ENCOUNTER — Inpatient Hospital Stay (HOSPITAL_COMMUNITY): Payer: Medicare HMO | Admitting: Occupational Therapy

## 2013-12-24 LAB — GLUCOSE, CAPILLARY
GLUCOSE-CAPILLARY: 144 mg/dL — AB (ref 70–99)
Glucose-Capillary: 230 mg/dL — ABNORMAL HIGH (ref 70–99)
Glucose-Capillary: 215 mg/dL — ABNORMAL HIGH (ref 70–99)
Glucose-Capillary: 242 mg/dL — ABNORMAL HIGH (ref 70–99)

## 2013-12-24 LAB — BASIC METABOLIC PANEL
BUN: 56 mg/dL — ABNORMAL HIGH (ref 6–23)
CALCIUM: 9.4 mg/dL (ref 8.4–10.5)
CO2: 25 meq/L (ref 19–32)
Chloride: 92 mEq/L — ABNORMAL LOW (ref 96–112)
Creatinine, Ser: 1.61 mg/dL — ABNORMAL HIGH (ref 0.50–1.35)
GFR calc non Af Amer: 39 mL/min — ABNORMAL LOW (ref >90)
GFR, EST AFRICAN AMERICAN: 45 mL/min — AB (ref >90)
Glucose, Bld: 215 mg/dL — ABNORMAL HIGH (ref 70–99)
Potassium: 4.2 mEq/L (ref 3.7–5.3)
SODIUM: 133 meq/L — AB (ref 137–147)

## 2013-12-24 LAB — PROTIME-INR
INR: 2.43 — AB (ref 0.00–1.49)
PROTHROMBIN TIME: 25.6 s — AB (ref 11.6–15.2)

## 2013-12-24 MED ORDER — WHITE PETROLATUM GEL
Status: AC
  Administered 2013-12-24: 0.2
  Filled 2013-12-24: qty 5

## 2013-12-24 MED ORDER — WARFARIN SODIUM 4 MG PO TABS
4.0000 mg | ORAL_TABLET | Freq: Once | ORAL | Status: AC
  Administered 2013-12-24: 4 mg via ORAL
  Filled 2013-12-24: qty 1

## 2013-12-24 MED ORDER — FUROSEMIDE 80 MG PO TABS
80.0000 mg | ORAL_TABLET | Freq: Two times a day (BID) | ORAL | Status: DC
  Filled 2013-12-24 (×3): qty 1

## 2013-12-24 NOTE — Progress Notes (Signed)
[name] GoodyBobby [name] is a 80 y.o. male 31-Aug-1931 [id number]  Subjective: No new complaints. No new problems. Slept well. Feeling OK.  Objective: Vital signs in last 24 hours: Temp:  [97.7 F (36.5 C)-97.9 F (36.6 C)] 97.9 F (36.6 C) (01/11 0531) Pulse Rate:  [79-80] 79 (01/11 0531) Resp:  [18-19] 18 (01/11 0531) BP: (109-118)/(48-68) 109/48 mmHg (01/11 0531) SpO2:  [91 %-99 %] 91 % (01/11 0531) Weight:  [166 lb 14.2 oz (75.7 kg)] 166 lb 14.2 oz (75.7 kg) (01/11 0531) Weight change: 7.1 oz (0.2 kg) Last BM Date: 12/22/13  Intake/Output from previous day: 01/10 0701 - 01/11 0700 In: 480 [P.O.:480] Out: 400 [Urine:400] Last cbgs: CBG (last 3)   Recent Labs  12/23/13 1705 12/23/13 2044 12/24/13 0724  GLUCAP 212* 105* 230*     Physical Exam General: No apparent distress  Chronically ill appearing  HEENT: moist mucosa Lungs: Normal effort. Lungs clear to auscultation, no crackles or wheezes. Cardiovascular: Irreg rate and rhythm, no edema Abdomen: S/NT/ND; BS(+) Musculoskeletal:  No change from before Neurological: No new neurological deficits Wounds: N/A    Skin: clear Alert, cooperative   Lab Results: BMET    Component Value Date/Time   NA 133* 12/24/2013 0600   K 4.2 12/24/2013 0600   CL 92* 12/24/2013 0600   CO2 25 12/24/2013 0600   GLUCOSE 215* 12/24/2013 0600   BUN 56* 12/24/2013 0600   CREATININE 1.61* 12/24/2013 0600   CALCIUM 9.4 12/24/2013 0600   GFRNONAA 39* 12/24/2013 0600   GFRAA 45* 12/24/2013 0600   CBC    Component Value Date/Time   WBC 15.3* 12/21/2013 0604   RBC 4.57 12/21/2013 0604   HGB 14.1 12/21/2013 0604   HCT 41.8 12/21/2013 0604   PLT 230 12/21/2013 0604   MCV 91.5 12/21/2013 0604   MCH 30.9 12/21/2013 0604   MCHC 33.7 12/21/2013 0604   RDW 14.9 12/21/2013 0604   LYMPHSABS 2.4 12/21/2013 0604   MONOABS 1.2* 12/21/2013 0604   EOSABS 0.0 12/21/2013 0604   BASOSABS 0.0 12/21/2013 0604    Studies/Results: No results found.  Medications: I have reviewed the  patient's current medications.  Assessment/Plan:  Deconditioning related acute renal failure, hypoxia, and multiple medical issues  1. RLE DVT/A fib/Anticoagulation: Pharmaceutical: Coumadin and Lovenox  2. Pain Management: prn tylenol  3. Mood: Will have LCSW follow for evaluation.  4. Neuropsych: This patient is capable of making decisions on his own behalf.  5. Epistaxis: Will monitor for now--may have to discuss risks v/s benefits if bleeding worsens/recurs as INR rises to therapeutic levels. Nasal packing remains in place. Antibiotic coverage while packing in place.  6. Question multilobar PNA: Will check follow CXR. Antibiotic will do 14 day course. Has persistent cough  7. AECOPD: Respiratory status improving and slow taper of steroids. Continue nebs and IS.  8. Acute on chronic CHF: Compensated--continue low salt diet with 1500 cc fluid restriction. On lasix 80 mg bid. Check daily weights. Daily monitoring of I's and O's. Appreciate cardiology note  9. A fib: monitor HR with bid checks. Continue diltiazem 240 mg daily.  10. GU: Foley d/c after rehab admit. Has frequency and urgency due to diuretics. . Toilet patient as unable to use urinal and needs to stand to empty. Condom cath at nights. Will check PVRs, requiring I/O caths  Cont Rx     Length of stay, days: 4  Sonda PrimesAlex Lucien Budney , MD 12/24/2013, 9:18 AM

## 2013-12-24 NOTE — Progress Notes (Signed)
ANTICOAGULATION CONSULT NOTE - Follow Up Consult  Pharmacy Consult for Coumadin Indication: atrial fibrillation  No Known Allergies  Patient Measurements: Height: 5\' 7"  (170.2 cm) Weight: 166 lb 14.2 oz (75.7 kg) IBW/kg (Calculated) : 66.1 Heparin Dosing Weight:   Vital Signs: Temp: 97.9 F (36.6 C) (01/11 0531) Temp src: Oral (01/11 0531) BP: 109/48 mmHg (01/11 0531) Pulse Rate: 79 (01/11 0531)  Labs:  Recent Labs  12/22/13 0655 12/23/13 0456 12/24/13 0600  LABPROT 22.2* 25.0* 25.6*  INR 2.02* 2.36* 2.43*  CREATININE 1.48* 1.54* 1.61*    Estimated Creatinine Clearance: 34.2 ml/min (by C-G formula based on Cr of 1.61).   Medications:  Scheduled:  . antiseptic oral rinse  15 mL Mouth Rinse q12n4p  . atorvastatin  10 mg Oral Daily  . diltiazem  240 mg Oral Daily  . famotidine  20 mg Oral QHS  . furosemide  80 mg Oral BID  . guaiFENesin  600 mg Oral BID  . insulin aspart  0-9 Units Subcutaneous TID WC  . insulin aspart  5 Units Subcutaneous TID WC  . levofloxacin  750 mg Oral Q48H  . linagliptin  5 mg Oral Daily  . multivitamin with minerals  1 tablet Oral Daily  . oxymetazoline  1 spray Each Nare BID  . potassium chloride  40 mEq Oral Daily  . predniSONE  10 mg Oral Q breakfast  . sodium chloride  4 spray Each Nare 6 X Daily  . tamsulosin  0.4 mg Oral QPC supper  . Warfarin - Pharmacist Dosing Inpatient   Does not apply q1800    Assessment: 80yo male on Coumadin for AFib.  No epistaxis reported, nasal packing remain in place.  Pt also on Levofloxacin.  INR 2.43  Goal of Therapy:  INR 2-3 Monitor platelets by anticoagulation protocol: Yes   Plan:  Coumadin 4mg  Cont daily INR  Marisue HumbleKendra Clemma Johnsen, PharmD Clinical Pharmacist [place] [facility]

## 2013-12-24 NOTE — Progress Notes (Signed)
At 1840, bladder scan was 4901. Pt stood and attempted to void without success. I&O cath for 600cc. Mick SellShannon Kealohilani Maiorino RN

## 2013-12-24 NOTE — Progress Notes (Signed)
Occupational Therapy Session Notes  Patient Details  Name: Elie GoodyBobby Manfred MRN: [id number] Date of Birth: [birth year]  Today's Date: 12/24/2013 Time: [phone] and 1300-1330 Time Calculation (min): 55  Min and 30 min  Short Term Goals: Week 1:  OT Short Term Goal 1 (Week 1): Patient will transfer to commode with mod assist OT Short Term Goal 2 (Week 1): Patient will wash lower body with mod assist OT Short Term Goal 3 (Week 1): Patient will dress lower body with max assist OT Short Term Goal 4 (Week 1): Patient will stand at sink for hygiene task with supervision  Skilled Therapeutic Interventions/Progress Updates:  1)  Patient seated in w/c upon arrival with wife, daughter and son in law visiting.  Daughter and son in law left after reviewed how much wife was assisting with BADLs PTA.  Daughter reports that her mother assisted much more than she should have and patient's wife confessed that she probably did.  Engaged in sponge bath and dressing with wife observing so she could see how much the patient was able to do and how much assistance is still required.  Focused session on caregiver education, activity tolerance, pursed lip breathing, sit><stands, static and dynamic standing balance and forced use of LUE.  Wife reports pleased with how much patient was able to do for himself and how much he used his LUE.  2)  Patient in bed upon arrival with wife at his bedside.  Engaged in transfers bed>w/c>commode>w/c.  Focused session on bed mobility techniques, sit><stands from EOB with 3 unsuccessful attempts, squat pivot transfer bed>w/c.  Patient attempted to stand in bathroom with both hands on grab bar yet unsuccessful and had to perform a squat pivot transfer w/c>commode.  Patient able to stand>sit with 2 hand on grab bar from commode to simulate clothing management then stand again for stand step transfer toilet>w/c.  Discussed bathroom layout at home with patient and wife and they are both in agreement  that grab bar next to toilet would be a good idea.  Wife unsure bathroom is accessible via walker or w/c and plans to post doorway clearance measurement above patient's bed.  Therapy Documentation Precautions:  Precautions Precautions: Fall Precaution Comments: chronic left hemiplegia Required Braces or Orthoses: Other Brace/Splint Other Brace/Splint: [name] Restrictions Weight Bearing Restrictions: No Pain: Denies pain in both sessions ADL: See FIM for current functional status   Therapy/Group: Individual Therapy both sessions  Haylen Bellotti 12/24/2013, 4:12 PM

## 2013-12-24 NOTE — Progress Notes (Signed)
Physical Therapy Session Note  Patient Details  Name: [name] [name] MRN: [id number] Date of Birth: 03-17-1931  Today's Date: 12/24/2013 Time: [id number] Time Calculation (min): 42 min  Short Term Goals: Week 1:  PT Short Term Goal 1 (Week 1): Patient will perform supine <> sit with supervision PT Short Term Goal 2 (Week 1): Patient will perform sit to stand with supervision PT Short Term Goal 3 (Week 1): Patient will ambulate 50 feet with [name], RW, and min steady assist PT Short Term Goal 4 (Week 1): Patient will ambulate up and down 4 steps with 1 rail and mod assist  Skilled Therapeutic Interventions/Progress Updates:  Pt was seen bedside in the pm. Pt performed seated LE exercises, AAROM L LE and AROM R LE to increase strength and flexibility. Pt performed sit to stand transfers 4 sets x 3 reps for LE strengthening. Pt transferred w/c to edge of bed with min to mod A with rolling walker. Pt transferred edge of bed to supine with min A.   Therapy Documentation Precautions:  Precautions Precautions: Fall Precaution Comments: chronic left hemiplegia Required Braces or Orthoses: Other Brace/Splint Other Brace/Splint: patient's wife bringing [name] Restrictions Weight Bearing Restrictions: No General:   Pain: No c/o pain.   See FIM for current functional status  Therapy/Group: Individual Therapy  [name], Alyria Krack G 12/24/2013, 3:45 PM

## 2013-12-24 NOTE — Progress Notes (Signed)
[facility] Patient and family were very pleased with d/c plan; Notified RNCM and CSW signing off.  Lorri Frederickonna T. West PughCrowder, LCSWA  [phone]

## 2013-12-24 NOTE — Progress Notes (Deleted)
[facility] gave auth for patient to go to [facility]; patient and family were very pleased with this plan.  Nursing and RNCM were notified. CSW signing off.   Lorri Frederickonna T. West PughCrowder, LCSWA  [phone]

## 2013-12-24 NOTE — Progress Notes (Signed)
Physical Therapy Session Note  Patient Details  Name: [name] [name] MRN: [id number] Date of Birth: Dec 10, 1931  Today's Date: 12/24/2013 Time: 0800-0900 Time Calculation (min): 60 min  Short Term Goals: Week 1:  PT Short Term Goal 1 (Week 1): Patient will perform supine <> sit with supervision PT Short Term Goal 2 (Week 1): Patient will perform sit to stand with supervision PT Short Term Goal 3 (Week 1): Patient will ambulate 50 feet with [name], RW, and min steady assist PT Short Term Goal 4 (Week 1): Patient will ambulate up and down 4 steps with 1 rail and mod assist  Skilled Therapeutic Interventions/Progress Updates:  Pt was seen bedside in the am. Pt rolls with side rail and S. Pt transferred supine to edge of bed with mod A and side rail. Pt transferred sit to stand with min to mod A and rolling walker to assist with donning pants. Pt transferred edge of bed to w/c with rolling walker and min to mod A with L [name]. In gym, pt performed multiple sit to stand transfers with rolling walker and min to mod A with verbal cues for technique. Pt ambulated 26 feet x 2 with rolling walker and min to mod A, L [name], decreased step length, increased genu recurvatum noted as pt fatigues. PT ascended/descended 2 stairs with 2 rails and mod A with verbal cues for technique.   Therapy Documentation Precautions:  Precautions Precautions: Fall Precaution Comments: chronic left hemiplegia Required Braces or Orthoses: Other Brace/Splint Other Brace/Splint: patient's wife bringing [name] Restrictions Weight Bearing Restrictions: No General:   Pain: No c/o pain.    Locomotion : Ambulation Ambulation/Gait Assistance: 3: Mod assist   See FIM for current functional status  Therapy/Group: Individual Therapy  [name], [name] [name] 12/24/2013, 9:36 AM

## 2013-12-24 NOTE — Progress Notes (Signed)
Pt's last void was around 0600 this morning. Bladder scan at 1300 was 573cc. Pt denied urge to void and declined to stand and try, despite being encouraged to do so. I&O cath for 600cc. Mick SellShannon Elouise Divelbiss RN

## 2013-12-24 NOTE — Progress Notes (Signed)
At 2345 I & O cath=400. At 0315 incontinent of urine, bladder scan=209. At 0533 incontinent of urine, PVR=382. Foam dressing applied to sacrum. [name] MartinezMurray, Katya Rolston A

## 2013-12-24 NOTE — Progress Notes (Signed)
incontinent void, PVR=284, no I & O cath needed at this time. [name] MartinezMurray, [name] [name]

## 2013-12-25 ENCOUNTER — Inpatient Hospital Stay (HOSPITAL_COMMUNITY): Payer: Medicare Other | Admitting: Physical Therapy

## 2013-12-25 ENCOUNTER — Inpatient Hospital Stay (HOSPITAL_COMMUNITY): Payer: Medicare Other | Admitting: Occupational Therapy

## 2013-12-25 LAB — URINALYSIS, ROUTINE W REFLEX MICROSCOPIC
Bilirubin Urine: NEGATIVE
Glucose, UA: NEGATIVE mg/dL
Ketones, ur: NEGATIVE mg/dL
Leukocytes, UA: NEGATIVE
NITRITE: NEGATIVE
Protein, ur: NEGATIVE mg/dL
SPECIFIC GRAVITY, URINE: 1.016 (ref 1.005–1.030)
UROBILINOGEN UA: 0.2 mg/dL (ref 0.0–1.0)
pH: 5 (ref 5.0–8.0)

## 2013-12-25 LAB — BASIC METABOLIC PANEL
BUN: 56 mg/dL — ABNORMAL HIGH (ref 6–23)
CO2: 27 meq/L (ref 19–32)
Calcium: 9.3 mg/dL (ref 8.4–10.5)
Chloride: 90 mEq/L — ABNORMAL LOW (ref 96–112)
Creatinine, Ser: 1.62 mg/dL — ABNORMAL HIGH (ref 0.50–1.35)
GFR calc Af Amer: 45 mL/min — ABNORMAL LOW (ref >90)
GFR, EST NON AFRICAN AMERICAN: 38 mL/min — AB (ref >90)
Glucose, Bld: 148 mg/dL — ABNORMAL HIGH (ref 70–99)
Potassium: 4.1 mEq/L (ref 3.7–5.3)
SODIUM: 131 meq/L — AB (ref 137–147)

## 2013-12-25 LAB — GLUCOSE, CAPILLARY
GLUCOSE-CAPILLARY: 264 mg/dL — AB (ref 70–99)
GLUCOSE-CAPILLARY: 171 mg/dL — AB (ref 70–99)
Glucose-Capillary: 159 mg/dL — ABNORMAL HIGH (ref 70–99)
Glucose-Capillary: 107 mg/dL — ABNORMAL HIGH (ref 70–99)

## 2013-12-25 LAB — URINE MICROSCOPIC-ADD ON

## 2013-12-25 LAB — PROTIME-INR
INR: 2.55 — AB (ref 0.00–1.49)
PROTHROMBIN TIME: 26.6 s — AB (ref 11.6–15.2)

## 2013-12-25 MED ORDER — BACITRACIN ZINC 500 UNIT/GM EX OINT
TOPICAL_OINTMENT | Freq: Two times a day (BID) | CUTANEOUS | Status: DC
  Administered 2013-12-25 – 2014-01-03 (×18): via TOPICAL
  Filled 2013-12-25: qty 28.35

## 2013-12-25 MED ORDER — WARFARIN SODIUM 4 MG PO TABS
4.0000 mg | ORAL_TABLET | Freq: Once | ORAL | Status: AC
  Administered 2013-12-25: 4 mg via ORAL
  Filled 2013-12-25: qty 1

## 2013-12-25 MED ORDER — FUROSEMIDE 40 MG PO TABS
40.0000 mg | ORAL_TABLET | Freq: Two times a day (BID) | ORAL | Status: DC
  Administered 2013-12-25: 40 mg via ORAL
  Filled 2013-12-25 (×3): qty 1

## 2013-12-25 NOTE — Progress Notes (Signed)
Indwelling coude cath placed at 1100, u/a sent, pending. Pt with full thickness wound at penis; 2 areas, see FS. Swelling present at glans,, foreskin retracted.  PAC Pam Love aware, urology saw pt, able to move foreskin; monitor.

## 2013-12-25 NOTE — Progress Notes (Signed)
ANTICOAGULATION CONSULT NOTE - Follow Up Consult  Pharmacy Consult for Coumadin Indication: atrial fibrillation  No Known Allergies  Patient Measurements: Height: 5\' 7"  (170.2 cm) Weight: 166 lb 14.2 oz (75.7 kg) IBW/kg (Calculated) : 66.1  Vital Signs: Temp: 98.2 F (36.8 C) (01/12 16100632) Temp src: Oral (01/12 0632) BP: 120/70 mmHg (01/12 0920) Pulse Rate: 86 (01/12 0632)  Labs:  Recent Labs  12/23/13 0456 12/24/13 0600 12/25/13 0555  LABPROT 25.0* 25.6* 26.6*  INR 2.36* 2.43* 2.55*  CREATININE 1.54* 1.61* 1.62*    Estimated Creatinine Clearance: 34 ml/min (by C-G formula based on Cr of 1.62).   Medications:  Scheduled:  . antiseptic oral rinse  15 mL Mouth Rinse q12n4p  . atorvastatin  10 mg Oral Daily  . diltiazem  240 mg Oral Daily  . famotidine  20 mg Oral QHS  . guaiFENesin  600 mg Oral BID  . insulin aspart  0-9 Units Subcutaneous TID WC  . insulin aspart  5 Units Subcutaneous TID WC  . levofloxacin  750 mg Oral Q48H  . linagliptin  5 mg Oral Daily  . multivitamin with minerals  1 tablet Oral Daily  . oxymetazoline  1 spray Each Nare BID  . sodium chloride  4 spray Each Nare 6 X Daily  . tamsulosin  0.4 mg Oral QPC supper  . Warfarin - Pharmacist Dosing Inpatient   Does not apply q1800    Assessment: 80yo male on Coumadin for AFib and acute RLE DVT. INR (2.55) therapeutic. Pt also on D#10 Levofloxacin.   Goal of Therapy:  INR 2-3 Monitor platelets by anticoagulation protocol: Yes   Plan:  Coumadin 4mg  Cont daily INR  Bayard HuggerMei Gates Jividen, PharmD, BCPS  Clinical Pharmacist  Pager: [phone]

## 2013-12-25 NOTE — Progress Notes (Signed)
At 0015, large incontinent void, [id number]. Penile edema. Foreskin will not return to normal position. Irritated area around glans. Patient reports having trouble PTA with foreskin staying retracted. Patient without complaint of pain. At 0230 bladder scan = 492, voided large incontinent, rescan = 110. Alfredo MartinezMurray, Henna Derderian A

## 2013-12-25 NOTE — Progress Notes (Signed)
Physical Therapy Session Note  Patient Details  Name: [name] [name] MRN: [id number] Date of Birth: 11-20-1931  Today's Date: 12/25/2013 Time: 0901-1002 and 1330-1400 Time Calculation (min): 61 min and 30 min  Short Term Goals: Week 1:  PT Short Term Goal 1 (Week 1): Patient will perform supine <> sit with supervision PT Short Term Goal 2 (Week 1): Patient will perform sit to stand with supervision PT Short Term Goal 3 (Week 1): Patient will ambulate 50 feet with [name], RW, and min steady assist PT Short Term Goal 4 (Week 1): Patient will ambulate up and down 4 steps with 1 rail and mod assist  Skilled Therapeutic Interventions/Progress Updates:   Pt reporting therapy rough over the weekend but reports it wasn't any harder than anything else.  Had further discussion with pt about equipment needs at D/C.  Pt reporting that he doesn't feel very safe using 4WW and may want to order 2WW for community use.  Pt would still like to use SPC in house.  Transferred to gym in w/c.  Began with LE extensor training with sit <> stand and sit <> squat from w/c x 5 reps with min-mod A and verbal cues to decrease dependence on RUE for support and manual facilitation for lateral weight shift and increased WB through LLE during sit <> stand and squat for increased concentric and eccentric quad activation LLE.  Attempted gait with SPC; pt performed sit > stand and stood with SPC mod A but unable to initiate taking a step with LLE and pt presented with posterior LOB.  Discussed with pt that depending on progress and balance he may need use of RW in the home for safety and to prevent falls initially at D/C.  Performed standing dynamic balance and strengthening exercises with bilat UE support on // bars with seated rest break in between each exercise.  Performed 10 reps each: alternating hip flexion marches, hip ABD, hip extension.  Performed gait x 5' with RW and min-mod A but secondary to DOE pt requested to return to sitting  and to room.  Sp02 assessed: 96% on RA but HR: 118 bpm.  Pt also reporting pressure behind his eyes.  RN alerted of elevated HR and pressure behind eyes.  Pt returned to room and transferred stand pivot to recliner with mod A.    PM session:  Pt just finished having foley catheter inserted by RN staff.  Pt denies pain, reporting significant fatigue but willing to participate.  Performed supine > sit with bed rail with mod A overall to assist with advancement of LLE to EOB, rolling to L side and bring trunk to fully upright position on EOB; pt required rest break during bed mobility secondary to DOE.  Assisted with donning shoes and [name] EOB.  Performed transfer training sit <> stand and stand pivot bed <> w/c and w/c<>simulated low car with UE support on RW or car door and mod A for lifting assistance from chair, assistance for full pivot of RW and verbal cues for full pivot of feet prior to sitting.  Also required min A to bring LLE into and out of car secondary to hip flexion weakness.  Will need to practice with wife prior to D/C.  Back in room pt requested to return to bed.  Performed sit > supine mod A.  Discussed with wife possible need for RW for home mobility.  Wife and pt verbalized agreement; will order RW through social work.    Therapy Documentation  Precautions:  Precautions Precautions: Fall Precaution Comments: chronic left hemiplegia Required Braces or Orthoses: Other Brace/Splint Other Brace/Splint: patient's wife bringing [name] Restrictions Weight Bearing Restrictions: No Vital Signs: Therapy Vitals BP: 120/70 mmHg Patient Position, if appropriate: Sitting Pain: Pain Assessment Pain Assessment: No/denies pain  See FIM for current functional status  Therapy/Group: Individual Therapy  Edman CircleHall, [name] [name] [facility] 12/25/2013, 10:53 AM

## 2013-12-25 NOTE — Progress Notes (Signed)
Patient ID: [name] [name], male   DOB: 01/13/1931, 80 y.o.   MRN: [id number]   SUBJECTIVE:  80 y/o male admitted with a/c respiratory failure in the setting of acute bronchitis and a/c diastolic HF.   No weight available.   Complains of fatigue. Denies SOB/Orthopnea/PND.   Creatinine 1.6>1.6    . antiseptic oral rinse  15 mL Mouth Rinse q12n4p  . atorvastatin  10 mg Oral Daily  . diltiazem  240 mg Oral Daily  . famotidine  20 mg Oral QHS  . furosemide  40 mg Oral BID  . guaiFENesin  600 mg Oral BID  . insulin aspart  0-9 Units Subcutaneous TID WC  . insulin aspart  5 Units Subcutaneous TID WC  . levofloxacin  750 mg Oral Q48H  . linagliptin  5 mg Oral Daily  . multivitamin with minerals  1 tablet Oral Daily  . oxymetazoline  1 spray Each Nare BID  . potassium chloride  40 mEq Oral Daily  . sodium chloride  4 spray Each Nare 6 X Daily  . tamsulosin  0.4 mg Oral QPC supper  . Warfarin - Pharmacist Dosing Inpatient   Does not apply q1800    Filed Vitals:   12/24/13 0531 12/24/13 1457 12/25/13 0632 12/25/13 0920  BP: 109/48 105/67 120/74 120/70  Pulse: 79 74 86   Temp: 97.9 F (36.6 C) 97.1 F (36.2 C) 98.2 F (36.8 C)   TempSrc: Oral Oral Oral   Resp: 18 17 16    Height:      Weight: 75.7 kg (166 lb 14.2 oz)     SpO2: 91% 97% 91%     Intake/Output Summary (Last 24 hours) at 12/25/13 1225 Last data filed at 12/25/13 0616  Gross per 24 hour  Intake    720 ml  Output   1202 ml  Net   -482 ml    LABS: Basic Metabolic Panel:  Recent Labs  11/91/47 0600 12/25/13 0555  NA 133* 131*  K 4.2 4.1  CL 92* 90*  CO2 25 27  GLUCOSE 215* 148*  BUN 56* 56*  CREATININE 1.61* 1.62*  CALCIUM 9.4 9.3   Liver Function Tests: No results found for this basename: AST, ALT, ALKPHOS, BILITOT, PROT, ALBUMIN,  in the last 72 hours No results found for this basename: LIPASE, AMYLASE,  in the last 72 hours CBC: No results found for this basename: WBC, NEUTROABS, HGB, HCT, MCV,  PLT,  in the last 72 hours Cardiac Enzymes: No results found for this basename: CKTOTAL, CKMB, CKMBINDEX, TROPONINI,  in the last 72 hours BNP: No components found with this basename: POCBNP,  D-Dimer: No results found for this basename: DDIMER,  in the last 72 hours Hemoglobin A1C: No results found for this basename: HGBA1C,  in the last 72 hours Fasting Lipid Panel: No results found for this basename: CHOL, HDL, LDLCALC, TRIG, CHOLHDL, LDLDIRECT,  in the last 72 hours Thyroid Function Tests: No results found for this basename: TSH, T4TOTAL, FREET3, T3FREE, THYROIDAB,  in the last 72 hours Anemia Panel: No results found for this basename: VITAMINB12, FOLATE, FERRITIN, TIBC, IRON, RETICCTPCT,  in the last 72 hours  PHYSICAL EXAM General: NAD. Elderly. Lying ine bed.  Neck: JVP 5-6 no thyromegaly or thyroid nodule.  Lungs: Decreased in the bases CV: Nondisplaced PMI. Distant HS.Marland Kitchen Heart irregular S1/S2, no S3/S4, no murmur. NO edema   Abdomen: Soft, nontender, no hepatosplenomegaly, no distention.  Neurologic: Alert and oriented x 3. LUE weakness and  swelling;  Psych: Normal affect. Extremities: No clubbing or cyanosis.    ASSESSMENT AND PLAN: 80 yo with history of chronic atrial fibrillation, COPD, and diastolic CHF admitted with respiratory failure, suspect combination of acute/chronic diastolic CHF and acute exacerbation of COPD.    1. Pulmonary: Stable.   2. Acute on chronic diastolic CHF: Volume status stable. Creatinine remains elevated. Hold lasix. Continue to follow Cr closely.   3. Atrial fibrillation with RVR: Controlled, on diltiazem CD. On coumadin, INR therapeutic. 4. Acute on chronic kidney injury: Creatinine remains elevated. 1.6  5. Epistaxis: Packing removed 1/8 by ENT.  He has acute RLE DVT in posterior tibial vein. INR therapeutic.    CLEGG,AMY, NP-C  12:25 PM   Patient seen and examined with Tonye BecketAmy Clegg, NP. We discussed all aspects of the encounter. I agree  with the assessment and plan as stated above.   He is doing well. Looks a bit dry so will hold lasix for a day or two. Resume soon. Tolerating anticoagulation well without recurrent epistaxis.   We will continue to follow. Appreciate CIR's care.   Truman Haywardaniel Dallan Schonberg,MD 5:54 PM

## 2013-12-25 NOTE — Progress Notes (Signed)
Subjective/Complaints: 80 y.o. male with history of COPD--O2 at nights, h/o CVA with L-HP and memory deficits, chronic CHF, A fib with RVR as well as recent admission for ischemic RLE requiring R- femoral embolectomy due to embolic event. [name] on 12/10/13 with worsening of dyspnea, anasarca, acute renal failure and left foot pain. He was treated for acute on chronic renal failure with IV diuresis and started on antibiotics for AECOPD v/s viral bronchitis. Required BIPAP due to mucous plugging. Treated with nebs and steroids and respiratory status improving. He developed acute epistaxis on 01/02/124 treated with chemical cautery and nasal packing by Dr. Wilburn Cornelia with recommendations for continued antibiotic coverage. Follow up doppler with thrombus right posterior tibial vein and no DVT LLE  No problems overnite Patient states that physical therapy was tiring yesterday Objective: Vital Signs: Blood pressure 120/74, pulse 86, temperature 98.2 F (36.8 C), temperature source Oral, resp. rate 16, height 5' 7"  (1.702 m), weight 75.7 kg (166 lb 14.2 oz), SpO2 91.00%. No results found. Results for orders placed during the hospital encounter of 12/20/13 (from the past 72 hour(s))  BASIC METABOLIC PANEL     Status: Abnormal   Collection Time    12/22/13  6:55 AM      Result Value Range   Sodium 133 (*) 137 - 147 mEq/L   Potassium 4.2  3.7 - 5.3 mEq/L   Chloride 92 (*) 96 - 112 mEq/L   CO2 27  19 - 32 mEq/L   Glucose, Bld 252 (*) 70 - 99 mg/dL   BUN 47 (*) 6 - 23 mg/dL   Creatinine, Ser 1.48 (*) 0.50 - 1.35 mg/dL   Calcium 9.5  8.4 - 10.5 mg/dL   GFR calc non Af Amer 43 (*) >90 mL/min   GFR calc Af Amer 50 (*) >90 mL/min   Comment: (NOTE)     The eGFR has been calculated using the CKD EPI equation.     This calculation has not been validated in all clinical situations.     eGFR's persistently <90 mL/min signify possible Chronic Kidney     Disease.  PROTIME-INR     Status: Abnormal    Collection Time    12/22/13  6:55 AM      Result Value Range   Prothrombin Time 22.2 (*) 11.6 - 15.2 seconds   INR 2.02 (*) 0.00 - 1.49  GLUCOSE, CAPILLARY     Status: Abnormal   Collection Time    12/22/13  7:02 AM      Result Value Range   Glucose-Capillary 240 (*) 70 - 99 mg/dL   Comment 1 Notify RN    GLUCOSE, CAPILLARY     Status: Abnormal   Collection Time    12/22/13 11:36 AM      Result Value Range   Glucose-Capillary 222 (*) 70 - 99 mg/dL   Comment 1 Notify RN    GLUCOSE, CAPILLARY     Status: Abnormal   Collection Time    12/22/13  4:26 PM      Result Value Range   Glucose-Capillary 337 (*) 70 - 99 mg/dL   Comment 1 Notify RN    GLUCOSE, CAPILLARY     Status: Abnormal   Collection Time    12/22/13  8:55 PM      Result Value Range   Glucose-Capillary 281 (*) 70 - 99 mg/dL   Comment 1 Notify RN    BASIC METABOLIC PANEL     Status: Abnormal  Collection Time    12/23/13  4:56 AM      Result Value Range   Sodium 133 (*) 137 - 147 mEq/L   Potassium 4.7  3.7 - 5.3 mEq/L   Chloride 93 (*) 96 - 112 mEq/L   CO2 28  19 - 32 mEq/L   Glucose, Bld 171 (*) 70 - 99 mg/dL   BUN 50 (*) 6 - 23 mg/dL   Creatinine, Ser 1.54 (*) 0.50 - 1.35 mg/dL   Calcium 9.3  8.4 - 10.5 mg/dL   GFR calc non Af Amer 41 (*) >90 mL/min   GFR calc Af Amer 47 (*) >90 mL/min   Comment: (NOTE)     The eGFR has been calculated using the CKD EPI equation.     This calculation has not been validated in all clinical situations.     eGFR's persistently <90 mL/min signify possible Chronic Kidney     Disease.  PROTIME-INR     Status: Abnormal   Collection Time    12/23/13  4:56 AM      Result Value Range   Prothrombin Time 25.0 (*) 11.6 - 15.2 seconds   INR 2.36 (*) 0.00 - 1.49  GLUCOSE, CAPILLARY     Status: Abnormal   Collection Time    12/23/13  7:35 AM      Result Value Range   Glucose-Capillary 179 (*) 70 - 99 mg/dL  GLUCOSE, CAPILLARY     Status: Abnormal   Collection Time    12/23/13  11:54 AM      Result Value Range   Glucose-Capillary 178 (*) 70 - 99 mg/dL  GLUCOSE, CAPILLARY     Status: Abnormal   Collection Time    12/23/13  5:05 PM      Result Value Range   Glucose-Capillary 212 (*) 70 - 99 mg/dL  GLUCOSE, CAPILLARY     Status: Abnormal   Collection Time    12/23/13  8:44 PM      Result Value Range   Glucose-Capillary 105 (*) 70 - 99 mg/dL   Comment 1 Notify RN    BASIC METABOLIC PANEL     Status: Abnormal   Collection Time    12/24/13  6:00 AM      Result Value Range   Sodium 133 (*) 137 - 147 mEq/L   Potassium 4.2  3.7 - 5.3 mEq/L   Chloride 92 (*) 96 - 112 mEq/L   CO2 25  19 - 32 mEq/L   Glucose, Bld 215 (*) 70 - 99 mg/dL   BUN 56 (*) 6 - 23 mg/dL   Creatinine, Ser 1.61 (*) 0.50 - 1.35 mg/dL   Calcium 9.4  8.4 - 10.5 mg/dL   GFR calc non Af Amer 39 (*) >90 mL/min   GFR calc Af Amer 45 (*) >90 mL/min   Comment: (NOTE)     The eGFR has been calculated using the CKD EPI equation.     This calculation has not been validated in all clinical situations.     eGFR's persistently <90 mL/min signify possible Chronic Kidney     Disease.  PROTIME-INR     Status: Abnormal   Collection Time    12/24/13  6:00 AM      Result Value Range   Prothrombin Time 25.6 (*) 11.6 - 15.2 seconds   INR 2.43 (*) 0.00 - 1.49  GLUCOSE, CAPILLARY     Status: Abnormal   Collection Time    12/24/13  7:24 AM  Result Value Range   Glucose-Capillary 230 (*) 70 - 99 mg/dL   Comment 1 Notify RN    GLUCOSE, CAPILLARY     Status: Abnormal   Collection Time    12/24/13 12:13 PM      Result Value Range   Glucose-Capillary 215 (*) 70 - 99 mg/dL   Comment 1 Notify RN    GLUCOSE, CAPILLARY     Status: Abnormal   Collection Time    12/24/13  4:43 PM      Result Value Range   Glucose-Capillary 144 (*) 70 - 99 mg/dL  GLUCOSE, CAPILLARY     Status: Abnormal   Collection Time    12/24/13  8:23 PM      Result Value Range   Glucose-Capillary 242 (*) 70 - 99 mg/dL     HEENT:  normal, no evidence of epistaxis Cardio: irregular and no murmurs, rate controlled Resp: CTA B/L and unlabored GI: BS positive and non distended Extremity:  Pulses absent Bilat pedal and R pop and No Edema Skin:   Intact Neuro: Alert/Oriented, Cranial Nerve Abnormalities L central 7, Normal Sensory, Abnormal Motor 3/5 L deltoid, bi, tri, grip,L quad, HF 2- ankle DF,  and Abnormal FMC Ataxic/ dec FMC Musc/Skel:  Other no pain with UE and LE ROM Gen NAD   Assessment/Plan: 1. Functional deficits secondary to Deconditioning respiratory and renal failure as well as chronic Left Hemiparesis from CVA 60yr ago which require 3+ hours per day of interdisciplinary therapy in a comprehensive inpatient rehab setting. Physiatrist is providing close team supervision and 24 hour management of active medical problems listed below. Physiatrist and rehab team continue to assess barriers to discharge/monitor patient progress toward functional and medical goals. FIM: FIM - Bathing Bathing Steps Patient Completed: Chest;Right Arm;Left Arm;Abdomen;Right upper leg;Left upper leg;Front perineal area;Right lower leg (including foot) Bathing: 4: Min-Patient completes 8-9 [id number] parts or 75+ percent  FIM - Upper Body Dressing/Undressing Upper body dressing/undressing steps patient completed: Thread/unthread right sleeve of pullover shirt/dresss;Thread/unthread left sleeve of pullover shirt/dress;Put head through opening of pull over shirt/dress;Pull shirt over trunk Upper body dressing/undressing: 5: Set-up assist to: Obtain clothing/put away FIM - Lower Body Dressing/Undressing Lower body dressing/undressing steps patient completed: Thread/unthread right underwear leg;Thread/unthread left underwear leg;Thread/unthread right pants leg;Thread/unthread left pants leg;Don/Doff right shoe;Fasten/unfasten right shoe Lower body dressing/undressing: 3: Mod-Patient completed 50-74% of tasks  FIM - Toileting Toileting: 1:  Total-Patient completed zero steps, helper did all 3  FIM - ToRadio producerevices: Grab bars;Elevated toilet seat Toilet Transfers: 3-To toilet/BSC: Mod A (lift or lower assist);3-From toilet/BSC: Mod A (lift or lower assist)  FIM - Bed/Chair Transfer Bed/Chair Transfer Assistive Devices: HOB elevated;Bed rails;Arm rests Bed/Chair Transfer: 3: Supine > Sit: Mod A (lifting assist/Pt. 50-74%/lift 2 legs;3: Bed > Chair or W/C: Mod A (lift or lower assist);3: Chair or W/C > Bed: Mod A (lift or lower assist)  FIM - Locomotion: Wheelchair Locomotion: Wheelchair: 1: Total Assistance/staff pushes wheelchair (Pt<25%) FIM - Locomotion: Ambulation Locomotion: Ambulation Assistive Devices: Walker - Rolling;Orthosis Ambulation/Gait Assistance: 3: Mod assist Locomotion: Ambulation: 1: Travels less than 50 ft with moderate assistance (Pt: 50 - 74%)  Comprehension Comprehension Mode: Auditory Comprehension: 5-Follows basic conversation/direction: With extra time/assistive device  Expression Expression Mode: Verbal Expression: 5-Expresses basic needs/ideas: With extra time/assistive device  Social Interaction Social Interaction: 6-Interacts appropriately with others with medication or extra time (anti-anxiety, antidepressant).  Problem Solving Problem Solving: 5-Solves basic 90% of the time/requires cueing <  10% of the time  Memory Memory: 5-Recognizes or recalls 90% of the time/requires cueing < 10% of the time  Medical Problem List and Plan:  Deconditioning related acute renal failure, hypoxia, and multiple medical issues  1. RLE DVT/A fib/Anticoagulation: Pharmaceutical: Coumadin and Lovenox  2. Pain Management: prn tylenol    3. Mood: Will have LCSW follow for evaluation.  4. Neuropsych: This patient is capable of making decisions on his own behalf.  5. Epistaxis: Will monitor for now--may have to discuss risks v/s benefits if bleeding worsens/recurs as INR  rises to therapeutic levels. Nasal packing remains in place. Antibiotic coverage while packing in place.  6. Question multilobar PNA: Will check follow CXR. Antibiotic will do 14 day course. Has persistent cough  7. AECOPD: Respiratory status improving and slow taper of steroids. Continue nebs and IS.  8. Acute on chronic CHF: Compensated--continue low salt diet with 1500 cc fluid restriction. On lasix 80 mg bid. Check daily weights. Daily monitoring of I's and O's   getting dry will back off dose since fluid intake ~1043m/24 hr 9. A fib: monitor HR with bid checks. Continue diltiazem 240 mg daily.  10. GU: Foley d/c after rehab admit. Has frequency and urgency due to diuretics. . Toilet patient as unable to use urinal and needs to stand to empty. Condom cath at nights. Will check PVRs, requiring I/O caths at times   LOS (Days) 5 A FACE TO FACE EVALUATION WAS PERFORMED  [name] [name] 12/25/2013, 6:44 AM

## 2013-12-25 NOTE — Progress Notes (Signed)
Has continued to require cath due to urinary retention. Nursing reports difficult and painful caths--needs coude catheter. Will place foley for bladder rest and repeat voiding trial later this week. Recheck UCS. Nursing also questions phimosis. Will check once patient back in bed.

## 2013-12-25 NOTE — H&P (Signed)
Urology Consult  Requesting provider:  Delle ReiningPamela Love with Dr. Camille BalAndrew Kristeins  CC: Paraphimosis  HPI:  80 year old male with multiple medical problems was recently admitted for acute on chronic renal failure, bilateral bronchitis, and an ischemic leg.  I was counseled for paraphimosis.  This was discovered by the nursing staff today.  Nothing makes it better or worse.  Associated with edema of the glans.  The patient has had ongoing intermittent urinary retention.  This is been managed by intermittent catheterization.  The patient denies any history of difficulty urinating, but I suspect that he does not have the ability to give an adequate history.  No family members are present when I evaluated the patient.  Foley catheters in place draining clear yellow urine.  He does have a mild amount of paraphimosis which is easily reducible.  He denies having this problem previously.  Nothing makes it better or worse.  PMH: Past Medical History  Diagnosis Date  . Hypertension   . Stroke   . COPD (chronic obstructive pulmonary disease)   . Diabetes mellitus without complication   . GERD (gastroesophageal reflux disease)   . A-fib     PSH: Past Surgical History  Procedure Laterality Date  . Carotid stent    . Parotid gland tumor excision    . Embolectomy Right 11/23/2013    Procedure: Right Femoral EMBOLECTOMY;  Surgeon: Chuck Hinthristopher S Dickson, MD;  [facility];  Service: Vascular;  Laterality: Right;  Right Femoral Embolectomy with Bovine Paricardium patch angioplasty.    Allergies: No Known Allergies  Medications: Prescriptions prior to admission  Medication Sig Dispense Refill  . atorvastatin (LIPITOR) 10 MG tablet Take 10 mg by mouth daily.      Marland Kitchen. diltiazem (CARDIZEM CD) 240 MG 24 hr capsule Take 1 capsule (240 mg total) by mouth daily.  30 capsule  0  . enoxaparin (LOVENOX) 80 MG/0.8ML injection Inject 0.8 mLs (80 mg total) into the skin every 12 (twelve) hours.  22.4 Syringe    .  furosemide (LASIX) 80 MG tablet Take 1 tablet (80 mg total) by mouth 2 (two) times daily.      Marland Kitchen. glipiZIDE (GLUCOTROL XL) 10 MG 24 hr tablet Take 10 mg by mouth 2 (two) times daily.      Marland Kitchen. levalbuterol (XOPENEX) 0.63 MG/3ML nebulizer solution Take 3 mLs (0.63 mg total) by nebulization every 6 (six) hours as needed for wheezing or shortness of breath.      . levofloxacin (LEVAQUIN) 750 MG tablet Take 1 tablet (750 mg total) by mouth daily.      Marland Kitchen. oxymetazoline (AFRIN) 0.05 % nasal spray Place 1 spray into both nostrils 2 (two) times daily.  30 mL  0  . oxymetazoline (AFRIN) 0.05 % nasal spray Place 2 sprays into both nostrils every 6 (six) hours as needed for congestion.  30 mL  0  . potassium chloride SA (K-DUR,KLOR-CON) 20 MEQ tablet Take 20 mEq by mouth daily.      Marland Kitchen. terazosin (HYTRIN) 5 MG capsule Take 5 mg by mouth every other day.      . warfarin (COUMADIN) 7.5 MG tablet Take 1 tablet (7.5 mg total) by mouth one time only at 6 PM.         Social History: History   Social History  . Marital Status: Married    Spouse Name: N/A    Number of Children: N/A  . Years of Education: N/A   Occupational History  . Not on file.  Social History Main Topics  . Smoking status: Former Games developer  . Smokeless tobacco: Not on file  . Alcohol Use: No  . Drug Use: No  . Sexual Activity: Not on file   Other Topics Concern  . Not on file   Social History Narrative  . No narrative on file    Family History: No family history on file.  Review of Systems: Positive: Paraphimosis. SOB. Leg weakness. Weak stream. Frequency. Negative: Chest pain, fever, gross hematuria.    Physical Exam: Filed Vitals:   12/25/13 1545  BP: 119/69  Pulse: 75  Temp: 97.5 F (36.4 C)  Resp: 18    General: No acute distress.  Awake. Head:  Normocephalic.  Atraumatic. ENT:  EOMI.  Mucous membranes moist Neck:  Supple.  No lymphadenopathy. CV:  S1 present. S2 present. Regular rate. Pulmonary: Equal effort  bilaterally.  Clear to auscultation bilaterally. Abdomen: Soft.  Non- tender to palpation. Skin:  Normal turgor.  No visible rash. Extremity: No gross deformity of bilateral upper extremities.  No gross deformity of    bilateral lower extremities. Neurologic: Alert. Appropriate mood. Rectal:  Negative prostate nodules. Prostate flat without tenderness. Negative rectal masses. Penis:  Uncircumcised.  No lesions. Paraphimosis reduced. Minimal phimosis. Urethra: Foley catheter in place.  Orthotopic meatus.   Studies:  No results found for this basename: HGB, WBC, PLT,  in the last 72 hours  Recent Labs     12/24/13  0600  12/25/13  0555  NA  133*  131*  K  4.2  4.1  CL  92*  90*  CO2  25  27  BUN  56*  56*  CREATININE  1.61*  1.62*  CALCIUM  9.4  9.3  GFRNONAA  39*  38*  GFRAA  45*  45*     Recent Labs     12/24/13  0600  12/25/13  0555  INR  2.43*  2.55*     No components found with this basename: ABG,     Assessment:  Paraphimosis.  Plan: Paraphimosis reduced easily.  This does not require surgical intervention.  Nursing staff needs to ensure that his foreskin is reduced each time after it is retracted.  She had an indwelling Foley catheter for retention.  I would suggest waiting another several days before trying a voiding trial.  If the primary team feels the patient could tolerate an alpha-blocker, Flomax may be beneficial.  Please call with questions.    Pager: [phone]    CC: Delle Reining with Dr. Camille Bal

## 2013-12-25 NOTE — Progress Notes (Signed)
Occupational Therapy Session Note  Patient Details  Name: [name] [name] MRN: [id number] Date of Birth: 29-Dec-1930  Today's Date: 12/25/2013 Time: 0732-0830 and 1115-1200 Time Calculation (min): 58 min and 45 min  Short Term Goals: Week 1:  OT Short Term Goal 1 (Week 1): Patient will transfer to commode with mod assist OT Short Term Goal 2 (Week 1): Patient will wash lower body with mod assist OT Short Term Goal 3 (Week 1): Patient will dress lower body with max assist OT Short Term Goal 4 (Week 1): Patient will stand at sink for hygiene task with supervision  Skilled Therapeutic Interventions/Progress Updates:    1) Pt seen for ADL retraining with focus on transfers, sit <> stand, and standing balance during self-care tasks of bathing and dressing. Pt completed stand pivot transfer bed > w/c with max assist. Requested to bathe at shower level, performed stand pivot w/c > tub bench in room shower with mod assist with pt demonstrating increased lift off with use of UE support of grab bar. Sit> stand with UE support min/steady assist and this therapist washed buttocks while pt maintained static standing balance. Issued pt longhandled sponge to increase participation in LB bathing.  Engaged in dressing at sink with pt able to thread RLE of brief and pants this session with increased time.   2) Pt seen for 1:1 OT with focus on sit <> stand, standing balance, and functional use of LUE.  Pt in recliner upon arrival sliding forward, performed squat/scoot pivot to w/c with focus on forward weight shifting and lift off of buttocks with transfer.  Completed sit <> stand x6 at high low table with focus on pushing up chair with RUE and stabilizing self on table with LUE to increase forward weight shift.  Engaged in standing task with pt reaching across midline with RUE to promote weight shift onto LLE and then use of LUE at table top level.  O2 monitored throughout session with pt with labored breathing, however  sats remaining > 92%.  At end of session, transferred pt to straight back chair in room to increase positioning.  Therapy Documentation Precautions:  Precautions Precautions: Fall Precaution Comments: chronic left hemiplegia Required Braces or Orthoses: Other Brace/Splint Other Brace/Splint: patient's wife bringing [name] Restrictions Weight Bearing Restrictions: No General:   Vital Signs: Therapy Vitals Temp: 98.2 F (36.8 C) Temp src: Oral Pulse Rate: 86 Resp: 16 BP: 120/70 mmHg Patient Position, if appropriate: Sitting Oxygen Therapy SpO2: 91 % O2 Device: None (Room air) Pain: Pain Assessment Pain Assessment: No/denies pain  See FIM for current functional status  Therapy/Group: Individual Therapy  Rosalio LoudHOXIE, [name] 12/25/2013, 9:28 AM

## 2013-12-26 ENCOUNTER — Inpatient Hospital Stay (HOSPITAL_COMMUNITY): Payer: Medicare Other | Admitting: Physical Therapy

## 2013-12-26 ENCOUNTER — Inpatient Hospital Stay (HOSPITAL_COMMUNITY): Payer: Medicare Other | Admitting: Occupational Therapy

## 2013-12-26 DIAGNOSIS — I5033 Acute on chronic diastolic (congestive) heart failure: Secondary | ICD-10-CM

## 2013-12-26 DIAGNOSIS — I509 Heart failure, unspecified: Secondary | ICD-10-CM

## 2013-12-26 DIAGNOSIS — G8110 Spastic hemiplegia affecting unspecified side: Secondary | ICD-10-CM

## 2013-12-26 DIAGNOSIS — R5381 Other malaise: Secondary | ICD-10-CM

## 2013-12-26 LAB — URINE CULTURE
Colony Count: NO GROWTH
Culture: NO GROWTH

## 2013-12-26 LAB — GLUCOSE, CAPILLARY
GLUCOSE-CAPILLARY: 225 mg/dL — AB (ref 70–99)
GLUCOSE-CAPILLARY: 153 mg/dL — AB (ref 70–99)
Glucose-Capillary: 190 mg/dL — ABNORMAL HIGH (ref 70–99)
Glucose-Capillary: 180 mg/dL — ABNORMAL HIGH (ref 70–99)

## 2013-12-26 LAB — PROTIME-INR
INR: 2.64 — AB (ref 0.00–1.49)
Prothrombin Time: 27.3 seconds — ABNORMAL HIGH (ref 11.6–15.2)

## 2013-12-26 MED ORDER — SODIUM CHLORIDE 0.45 % IV BOLUS
250.0000 mL | Freq: Once | INTRAVENOUS | Status: AC
  Administered 2013-12-26: 250 mL via INTRAVENOUS

## 2013-12-26 MED ORDER — TRAMADOL-ACETAMINOPHEN 37.5-325 MG PO TABS
1.0000 | ORAL_TABLET | ORAL | Status: DC | PRN
  Administered 2013-12-28 – 2013-12-30 (×2): 1 via ORAL
  Filled 2013-12-26 (×2): qty 1

## 2013-12-26 MED ORDER — WARFARIN SODIUM 4 MG PO TABS
4.0000 mg | ORAL_TABLET | Freq: Once | ORAL | Status: AC
  Administered 2013-12-26: 4 mg via ORAL
  Filled 2013-12-26: qty 1

## 2013-12-26 MED ORDER — FUROSEMIDE 40 MG PO TABS
40.0000 mg | ORAL_TABLET | Freq: Two times a day (BID) | ORAL | Status: DC
  Administered 2013-12-27 – 2013-12-31 (×9): 40 mg via ORAL
  Filled 2013-12-26 (×11): qty 1

## 2013-12-26 MED ORDER — HYDROCERIN EX CREA
TOPICAL_CREAM | Freq: Two times a day (BID) | CUTANEOUS | Status: DC
  Administered 2013-12-26 – 2014-01-03 (×17): via TOPICAL
  Filled 2013-12-26: qty 113

## 2013-12-26 MED ORDER — GLIMEPIRIDE 1 MG PO TABS
1.0000 mg | ORAL_TABLET | Freq: Every day | ORAL | Status: DC
  Administered 2013-12-27 – 2014-01-03 (×8): 1 mg via ORAL
  Filled 2013-12-26 (×10): qty 1

## 2013-12-26 NOTE — Progress Notes (Signed)
Physical Therapy Session Note  Patient Details  Name: [name] GoodyBobby Deman MRN: [id number] Date of Birth: [birth year]  Today's Date: 12/26/2013 Time: 0902-1003 and [id number] Time Calculation (min): 61 min and 40 min   Short Term Goals: Week 1:  PT Short Term Goal 1 (Week 1): Patient will perform supine <> sit with supervision PT Short Term Goal 2 (Week 1): Patient will perform sit to stand with supervision PT Short Term Goal 3 (Week 1): Patient will ambulate 50 feet with [name], RW, and min steady assist PT Short Term Goal 4 (Week 1): Patient will ambulate up and down 4 steps with 1 rail and mod assist  Skilled Therapeutic Interventions/Progress Updates:   Pt seated in w/c; pt more lethargic this morning and requiring increased physical assistance for all mobility.  Transported to gym in w/c total A.  Performed stand pivot transfers w/c <> mat and w/c > bed at end of session with max A with assistance for lifting from w/c and to fully extend LLE, assistance to perform full pivot with RW and feet prior to sitting and assistance for controlled lowering to seat.  While pivoting pt becoming anxious about LLE weakness and attempting to sit too early.  Attempted to discuss with pt bed set up at home in order to simulate bed room and practice bed mobility on flat bed but at EOB pt unable to maintain trunk control or sitting balance without max-total A; pt with strong forward and R lateral lean/LOB.  Pt transitioned sit > supine with mod-max A.  In supine performed bilat LE exercises for strengthening with gravity minimized.  Performed 12 reps each R and L: hip IR/ADD, heel slides, SAQ and glute sets, closed chain isometric hip ABD, isometric hip ABD with bridging.  Performed rolling to R side and side > sit with mod-max A overall.  Pt very lethargic in supine and required constant cues to maintain arousal and alertness.  Attempted to have pt maintain attention to task with counting reps out loud.  Assess BP in supine  and sitting EOB with drop in BP in sitting position.  RN alerted.  Returned to w/c and to room.  IV team present to place IV; pt transferred to bed and back to supine in bed with mod-max A overall.    PM session: Pt resting/asleep in bed upon arrival.  Pt reporting feeling more alert than this am and willing to participate in therapy.  Performed rolling to L side and side > sit with mod-max A to bring LE off of bed and to bring trunk fully upright to EOB.  Performed stand pivot bed > w/c with RW and max A secondary to pt requiring increased lifting assistance to stand and assistance to pivot RW, for lateral weight shifting in standing with verbal cues for stepping sequence and assistance to maintain LE extension bilaterally.  The longer the pt stood the more flexed he became in his trunk, hips and knees.  Vitals assessed: BP: 109/63, 80 bpm and 96%.  Continued to gym in w/c.  Performed gait with RW x 12' with mod A overall with verbal cues to maintain upright trunk, safe distance to RW and full step length bilaterally.  Attempted second gait repetition but pt unable to maintain UE extension and unable to advance either LE secondary to weakness.  Returned to sitting and placed w/c beside pt.  Pt required total A to stand from chair and transfer with pivot to w/c secondary to inability to extend trunk or  LE and inability to advance each LE.  Returned to room in w/c and set up with all items within reach.  During each activity DOE noted but Sp02 remained >90% on RA.    Therapy Documentation Precautions:  Precautions Precautions: Fall Precaution Comments: chronic left hemiplegia Required Braces or Orthoses: Other Brace/Splint Other Brace/Splint: patient's wife bringing [name] Restrictions Weight Bearing Restrictions: No Vital Signs: Therapy Vitals Pulse Rate: 82 BP: 96/67 mmHg Patient Position, if appropriate: Sitting Oxygen Therapy SpO2: 91 % O2 Device: None (Room air) Pain: Pain Assessment Pain  Assessment: No/denies pain  See FIM for current functional status  Therapy/Group: Individual Therapy  Edman Circle [facility] 12/26/2013, 11:24 AM

## 2013-12-26 NOTE — Progress Notes (Signed)
ANTICOAGULATION CONSULT NOTE - Follow Up Consult  Pharmacy Consult for Coumadin Indication: atrial fibrillation  No Known Allergies  Patient Measurements: Height: 5\' 7"  (170.2 cm) Weight: 166 lb 3.6 oz (75.4 kg) IBW/kg (Calculated) : 66.1  Vital Signs: Temp: 97.4 F (36.3 C) (01/13 0557) Temp src: Oral (01/13 0557) BP: 96/67 mmHg (01/13 1123) Pulse Rate: 82 (01/13 1123)  Labs:  Recent Labs  12/24/13 0600 12/25/13 0555 12/26/13 0550  LABPROT 25.6* 26.6* 27.3*  INR 2.43* 2.55* 2.64*  CREATININE 1.61* 1.62*  --     Estimated Creatinine Clearance: 34 ml/min (by C-G formula based on Cr of 1.62).   Medications:  Scheduled:  . antiseptic oral rinse  15 mL Mouth Rinse q12n4p  . atorvastatin  10 mg Oral Daily  . bacitracin   Topical BID  . diltiazem  240 mg Oral Daily  . famotidine  20 mg Oral QHS  . [START ON 12/27/2013] furosemide  40 mg Oral BID  . guaiFENesin  600 mg Oral BID  . hydrocerin   Topical BID  . insulin aspart  0-9 Units Subcutaneous TID WC  . insulin aspart  5 Units Subcutaneous TID WC  . levofloxacin  750 mg Oral Q48H  . linagliptin  5 mg Oral Daily  . multivitamin with minerals  1 tablet Oral Daily  . oxymetazoline  1 spray Each Nare BID  . sodium chloride  4 spray Each Nare 6 X Daily  . sodium chloride  250 mL Intravenous Once  . tamsulosin  0.4 mg Oral QPC supper  . Warfarin - Pharmacist Dosing Inpatient   Does not apply q1800    Assessment: 80yo male on Coumadin for AFib and acute RLE DVT. INR (2.64) therapeutic. Pt also on D#11 Levofloxacin.   Goal of Therapy:  INR 2-3 Monitor platelets by anticoagulation protocol: Yes   Plan:  Coumadin 4mg  Cont daily INR  Leota SauersLisa Cael Worth Pharm.D. CPP, BCPS Clinical Pharmacist [phone] 12/26/2013 11:34 AM

## 2013-12-26 NOTE — Progress Notes (Addendum)
Subjective/Complaints: 80 y.o. male with history of COPD--O2 at nights, h/o CVA with L-HP and memory deficits, chronic CHF, A fib with RVR as well as recent admission for ischemic RLE requiring R- femoral embolectomy due to embolic event. [name] on 12/10/13 with worsening of dyspnea, anasarca, acute renal failure and left foot pain. He was treated for acute on chronic renal failure with IV diuresis and started on antibiotics for AECOPD v/s viral bronchitis. Required BIPAP due to mucous plugging. Treated with nebs and steroids and respiratory status improving. He developed acute epistaxis on 01/02/124 treated with chemical cautery and nasal packing by Dr. Wilburn Cornelia with recommendations for continued antibiotic coverage. Follow up doppler with thrombus right posterior tibial vein and no DVT LLE  No problems overnite Appreciate cardiology note, lasix held  Some dizziness today per OT, BP is ok, received Oxycodone for HA yesterday Objective: Vital Signs: Blood pressure 98/60, pulse 75, temperature 97.4 F (36.3 C), temperature source Oral, resp. rate 18, height 5' 7"  (1.702 m), weight 75.4 kg (166 lb 3.6 oz), SpO2 93.00%. No results found. Results for orders placed during the hospital encounter of 12/20/13 (from the past 72 hour(s))  GLUCOSE, CAPILLARY     Status: Abnormal   Collection Time    12/23/13 11:54 AM      Result Value Range   Glucose-Capillary 178 (*) 70 - 99 mg/dL  GLUCOSE, CAPILLARY     Status: Abnormal   Collection Time    12/23/13  5:05 PM      Result Value Range   Glucose-Capillary 212 (*) 70 - 99 mg/dL  GLUCOSE, CAPILLARY     Status: Abnormal   Collection Time    12/23/13  8:44 PM      Result Value Range   Glucose-Capillary 105 (*) 70 - 99 mg/dL   Comment 1 Notify RN    BASIC METABOLIC PANEL     Status: Abnormal   Collection Time    12/24/13  6:00 AM      Result Value Range   Sodium 133 (*) 137 - 147 mEq/L   Potassium 4.2  3.7 - 5.3 mEq/L   Chloride 92 (*)  96 - 112 mEq/L   CO2 25  19 - 32 mEq/L   Glucose, Bld 215 (*) 70 - 99 mg/dL   BUN 56 (*) 6 - 23 mg/dL   Creatinine, Ser 1.61 (*) 0.50 - 1.35 mg/dL   Calcium 9.4  8.4 - 10.5 mg/dL   GFR calc non Af Amer 39 (*) >90 mL/min   GFR calc Af Amer 45 (*) >90 mL/min   Comment: (NOTE)     The eGFR has been calculated using the CKD EPI equation.     This calculation has not been validated in all clinical situations.     eGFR's persistently <90 mL/min signify possible Chronic Kidney     Disease.  PROTIME-INR     Status: Abnormal   Collection Time    12/24/13  6:00 AM      Result Value Range   Prothrombin Time 25.6 (*) 11.6 - 15.2 seconds   INR 2.43 (*) 0.00 - 1.49  GLUCOSE, CAPILLARY     Status: Abnormal   Collection Time    12/24/13  7:24 AM      Result Value Range   Glucose-Capillary 230 (*) 70 - 99 mg/dL   Comment 1 Notify RN    GLUCOSE, CAPILLARY     Status: Abnormal   Collection Time    12/24/13  12:13 PM      Result Value Range   Glucose-Capillary 215 (*) 70 - 99 mg/dL   Comment 1 Notify RN    GLUCOSE, CAPILLARY     Status: Abnormal   Collection Time    12/24/13  4:43 PM      Result Value Range   Glucose-Capillary 144 (*) 70 - 99 mg/dL  GLUCOSE, CAPILLARY     Status: Abnormal   Collection Time    12/24/13  8:23 PM      Result Value Range   Glucose-Capillary 242 (*) 70 - 99 mg/dL  BASIC METABOLIC PANEL     Status: Abnormal   Collection Time    12/25/13  5:55 AM      Result Value Range   Sodium 131 (*) 137 - 147 mEq/L   Potassium 4.1  3.7 - 5.3 mEq/L   Chloride 90 (*) 96 - 112 mEq/L   CO2 27  19 - 32 mEq/L   Glucose, Bld 148 (*) 70 - 99 mg/dL   BUN 56 (*) 6 - 23 mg/dL   Creatinine, Ser 1.62 (*) 0.50 - 1.35 mg/dL   Calcium 9.3  8.4 - 10.5 mg/dL   GFR calc non Af Amer 38 (*) >90 mL/min   GFR calc Af Amer 45 (*) >90 mL/min   Comment: (NOTE)     The eGFR has been calculated using the CKD EPI equation.     This calculation has not been validated in all clinical situations.      eGFR's persistently <90 mL/min signify possible Chronic Kidney     Disease.  PROTIME-INR     Status: Abnormal   Collection Time    12/25/13  5:55 AM      Result Value Range   Prothrombin Time 26.6 (*) 11.6 - 15.2 seconds   INR 2.55 (*) 0.00 - 1.49  GLUCOSE, CAPILLARY     Status: Abnormal   Collection Time    12/25/13  7:16 AM      Result Value Range   Glucose-Capillary 159 (*) 70 - 99 mg/dL  GLUCOSE, CAPILLARY     Status: Abnormal   Collection Time    12/25/13 11:53 AM      Result Value Range   Glucose-Capillary 264 (*) 70 - 99 mg/dL  URINALYSIS, ROUTINE W REFLEX MICROSCOPIC     Status: Abnormal   Collection Time    12/25/13  1:27 PM      Result Value Range   Color, Urine YELLOW  YELLOW   APPearance CLEAR  CLEAR   Specific Gravity, Urine 1.016  1.005 - 1.030   pH 5.0  5.0 - 8.0   Glucose, UA NEGATIVE  NEGATIVE mg/dL   Hgb urine dipstick SMALL (*) NEGATIVE   Bilirubin Urine NEGATIVE  NEGATIVE   Ketones, ur NEGATIVE  NEGATIVE mg/dL   Protein, ur NEGATIVE  NEGATIVE mg/dL   Urobilinogen, UA 0.2  0.0 - 1.0 mg/dL   Nitrite NEGATIVE  NEGATIVE   Leukocytes, UA NEGATIVE  NEGATIVE  URINE MICROSCOPIC-ADD ON     Status: Abnormal   Collection Time    12/25/13  1:27 PM      Result Value Range   Squamous Epithelial / LPF FEW (*) RARE   WBC, UA 0-2  <3 WBC/hpf   RBC / HPF 0-2  <3 RBC/hpf   Bacteria, UA FEW (*) RARE   Casts HYALINE CASTS (*) NEGATIVE  GLUCOSE, CAPILLARY     Status: Abnormal   Collection Time  12/25/13  4:42 PM      Result Value Range   Glucose-Capillary 107 (*) 70 - 99 mg/dL  GLUCOSE, CAPILLARY     Status: Abnormal   Collection Time    12/25/13  8:43 PM      Result Value Range   Glucose-Capillary 171 (*) 70 - 99 mg/dL   Comment 1 Notify RN    PROTIME-INR     Status: Abnormal   Collection Time    12/26/13  5:50 AM      Result Value Range   Prothrombin Time 27.3 (*) 11.6 - 15.2 seconds   INR 2.64 (*) 0.00 - 1.49  GLUCOSE, CAPILLARY     Status: Abnormal    Collection Time    12/26/13  7:26 AM      Result Value Range   Glucose-Capillary 225 (*) 70 - 99 mg/dL   Comment 1 Notify RN       HEENT: normal, no evidence of epistaxis Cardio: irregular and no murmurs, rate controlled Resp: CTA B/L and unlabored GI: BS positive and non distended Extremity:  Pulses absent Bilat pedal and R pop and No Edema Skin:   Intact but dry Neuro: Alert/Oriented, Cranial Nerve Abnormalities L central 7, Normal Sensory, Abnormal Motor 3/5 L deltoid, bi, tri, grip,L quad, HF 2- ankle DF,  and Abnormal FMC Ataxic/ dec FMC Musc/Skel:  Other no pain with UE and LE ROM Gen NAD   Assessment/Plan: 1. Functional deficits secondary to Deconditioning respiratory and renal failure as well as chronic Left Hemiparesis from CVA 38yr ago which require 3+ hours per day of interdisciplinary therapy in a comprehensive inpatient rehab setting. Physiatrist is providing close team supervision and 24 hour management of active medical problems listed below. Physiatrist and rehab team continue to assess barriers to discharge/monitor patient progress toward functional and medical goals. FIM: FIM - Bathing Bathing Steps Patient Completed: Chest;Right Arm;Left Arm;Abdomen;Right upper leg;Left upper leg;Front perineal area;Right lower leg (including foot);Left lower leg (including foot) Bathing: 4: Min-Patient completes 8-9 [id number] parts or 75+ percent  FIM - Upper Body Dressing/Undressing Upper body dressing/undressing steps patient completed: Thread/unthread right sleeve of pullover shirt/dresss;Thread/unthread left sleeve of pullover shirt/dress;Put head through opening of pull over shirt/dress;Pull shirt over trunk Upper body dressing/undressing: 5: Set-up assist to: Obtain clothing/put away FIM - Lower Body Dressing/Undressing Lower body dressing/undressing steps patient completed: Thread/unthread right underwear leg;Thread/unthread right pants leg;Don/Doff right shoe Lower body  dressing/undressing: 2: Max-Patient completed 25-49% of tasks  FIM - Toileting Toileting: 1: Total-Patient completed zero steps, helper did all 3  FIM - ToRadio producerevices: Grab bars;Elevated toilet seat Toilet Transfers: 3-To toilet/BSC: Mod A (lift or lower assist);3-From toilet/BSC: Mod A (lift or lower assist)  FIM - BeControl and instrumentation engineerevices: Walker;Arm rests Bed/Chair Transfer: 3: Bed > Chair or W/C: Mod A (lift or lower assist);3: Chair or W/C > Bed: Mod A (lift or lower assist)  FIM - Locomotion: Wheelchair Locomotion: Wheelchair: 1: Total Assistance/staff pushes wheelchair (Pt<25%) FIM - Locomotion: Ambulation Locomotion: Ambulation Assistive Devices: Walker - Rolling;Orthosis Ambulation/Gait Assistance: 3: Mod assist Locomotion: Ambulation: 1: Travels less than 50 ft with moderate assistance (Pt: 50 - 74%)  Comprehension Comprehension Mode: Auditory Comprehension: 5-Follows basic conversation/direction: With extra time/assistive device  Expression Expression Mode: Verbal Expression: 5-Expresses basic needs/ideas: With extra time/assistive device  Social Interaction Social Interaction: 6-Interacts appropriately with others with medication or extra time (anti-anxiety, antidepressant).  Problem Solving Problem Solving: 5-Solves basic 90% of  the time/requires cueing < 10% of the time  Memory Memory: 5-Recognizes or recalls 90% of the time/requires cueing < 10% of the time  Medical Problem List and Plan:  Deconditioning related acute renal failure, hypoxia, and multiple medical issues  1. RLE DVT/A fib/Anticoagulation: Pharmaceutical: Coumadin and Lovenox  2. Pain Management: prn tylenol    3. Mood: Will have LCSW follow for evaluation.  4. Neuropsych: This patient is capable of making decisions on his own behalf.  5. Epistaxis:resolved                                                                   6.  Question multilobar PNA: Will check follow CXR. Antibiotic will do 14 day course. Has persistent cough  7. AECOPD: Respiratory status improving and slow taper of steroids. Continue nebs and IS.  8. Acute on chronic CHF: Compensated--continue low salt diet with 1500 cc fluid restriction.Lasix held per cardiology Check daily weights. Daily monitoring of I's and O's   gfluid intake ~1062m/24 hr 9. A fib: monitor HR with bid checks. Continue diltiazem 240 mg daily.  10. GU: Foley, s/p paraphimosis reduction per GU,UA -   LOS (Days) 6 A FACE TO FACE EVALUATION WAS PERFORMED  KIRSTEINS,ANDREW E 12/26/2013, 7:48 AM

## 2013-12-26 NOTE — Progress Notes (Signed)
Occupational Therapy Session Note  Patient Details  Name: [name] [name] MRN: [id number] Date of Birth: 01-Aug-1931  Today's Date: 12/26/2013 Time: 0730-0827 and 1130-1200 Time Calculation (min): 57 min and 30 min  Short Term Goals: Week 1:  OT Short Term Goal 1 (Week 1): Patient will transfer to commode with mod assist OT Short Term Goal 2 (Week 1): Patient will wash lower body with mod assist OT Short Term Goal 3 (Week 1): Patient will dress lower body with max assist OT Short Term Goal 4 (Week 1): Patient will stand at sink for hygiene task with supervision  Skilled Therapeutic Interventions/Progress Updates:    1) Pt seen for ADL retraining with focus on bed mobility, sitting balance, and increased participation in LB dressing.  Pt asleep upon arrival, upon waking pt reports not fully awake and not sleeping well over night.  Completed bed mobility with assist to position LLE over EOB and mod assist supine to sit.  While seated EOB pt required mod-max physical assist to maintain static sitting balance this session with leaning forward and inability to keep BLE stable on floor.  Pt reports feeling woozy.  Returned to supine in bed and notified RN of woozy and decreased sitting balance.  Vitals assessed: BP 116/60 supine and 110/60 seated EOB. RN reports having Oxycodone at 0600 and last night which may have been too much.  MD present and aware of situation, discussing IV fluids and decreasing Oxycodone.  Squat pivot transfer to w/c with +2 assist secondary to decreased trunk control this session.  Pt completed UB dressing with increased time and effort and due to decreased trunk control this session pt total assist for LB dressing.   2) Pt seen for 1:1 OT with focus on bed mobility, transfers, sit <> stand, and LB dressing tasks.  Pt received supine in bed, engaged in bed mobility with pt able to get LLE to EOB still requiring mod-max supine to sit.  Engaged in lateral scoots on bed in preparation  for transfer with pt requiring max-total assist with forward weight shift and scoots.  Unable to sequence squat pivot, therefore completed stand pivot to w/c with max assist.  Engaged in LB dressing with donning socks and shoes, pt reports would cross foot over opposite knee to don Rt sock and shoe but wife would assist with Lt sock and shoe.  Pt with decreased sequencing and problem solving with crossing legs this session, requiring assist to cross over opposite leg and tactile cues to keep Lt hip intact with chair.  Wife present during session and mentioning he appears different.  Pt would report needing to cross leg over opposite leg but then would proceed to bend forward to don shoes.  Question whether lasix, dehydration, low BP affecting pt's participation and cognition this session.  Therapy Documentation Precautions:  Precautions Precautions: Fall Precaution Comments: chronic left hemiplegia Required Braces or Orthoses: Other Brace/Splint Other Brace/Splint: patient's wife bringing [name] Restrictions Weight Bearing Restrictions: No General:   Vital Signs: Therapy Vitals Temp: 97.4 F (36.3 C) Temp src: Oral Pulse Rate: 75 Resp: 18 BP: 98/60 mmHg Patient Position, if appropriate: Lying Oxygen Therapy SpO2: 93 % O2 Device: None (Room air) Pain: Pain Assessment Pain Assessment: 0-10 Pain Score: 6  Pain Type: Acute pain Pain Location: Hip Pain Orientation: Left Pain Descriptors / Indicators: Aching Pain Frequency: Occasional Pain Onset: Awakened from sleep Patients Stated Pain Goal: 2 Pain Intervention(s): Medication (See eMAR);Repositioned Multiple Pain Sites: No  See FIM for current  functional status  Therapy/Group: Individual Therapy  Rosalio Loud 12/26/2013, 9:09 AM

## 2013-12-27 ENCOUNTER — Inpatient Hospital Stay (HOSPITAL_COMMUNITY): Payer: Medicare Other | Admitting: Occupational Therapy

## 2013-12-27 ENCOUNTER — Inpatient Hospital Stay (HOSPITAL_COMMUNITY): Payer: Medicare Other | Admitting: Physical Therapy

## 2013-12-27 LAB — BASIC METABOLIC PANEL
BUN: 42 mg/dL — ABNORMAL HIGH (ref 6–23)
CHLORIDE: 91 meq/L — AB (ref 96–112)
CO2: 26 meq/L (ref 19–32)
CREATININE: 1.47 mg/dL — AB (ref 0.50–1.35)
Calcium: 9.4 mg/dL (ref 8.4–10.5)
GFR calc Af Amer: 50 mL/min — ABNORMAL LOW (ref >90)
GFR calc non Af Amer: 43 mL/min — ABNORMAL LOW (ref >90)
Glucose, Bld: 182 mg/dL — ABNORMAL HIGH (ref 70–99)
Potassium: 5.1 mEq/L (ref 3.7–5.3)
Sodium: 130 mEq/L — ABNORMAL LOW (ref 137–147)

## 2013-12-27 LAB — GLUCOSE, CAPILLARY
GLUCOSE-CAPILLARY: 149 mg/dL — AB (ref 70–99)
GLUCOSE-CAPILLARY: 91 mg/dL (ref 70–99)
Glucose-Capillary: 130 mg/dL — ABNORMAL HIGH (ref 70–99)
Glucose-Capillary: 115 mg/dL — ABNORMAL HIGH (ref 70–99)

## 2013-12-27 LAB — PROTIME-INR
INR: 3.16 — ABNORMAL HIGH (ref 0.00–1.49)
PROTHROMBIN TIME: 31.3 s — AB (ref 11.6–15.2)

## 2013-12-27 NOTE — Patient Care Conference (Signed)
Inpatient RehabilitationTeam Conference and Plan of Care Update Date: 12/27/2013   Time: 11:00 AM    Patient Name: [name] [name]      Medical Record Number: [id number]  Date of Birth: [birth year] Sex: Male         Room/Bed: 4M11C/4M11C-01 Payor Info: Payor: [employer] MEDICARE / Plan: [employer] MEDICARE HMO / Product Type: *No Product type* /    Admitting Diagnosis: DECONDITIONED  Admit Date/Time:  12/20/2013  7:03 PM Admission Comments: No comment available   Primary Diagnosis:  <principal problem not specified> Principal Problem: <principal problem not specified>  Patient Active Problem List   Diagnosis Date Noted  . Malnutrition of moderate degree 12/20/2013  . Physical deconditioning 12/20/2013  . DVT (deep venous thrombosis) 12/18/2013  . Anasarca 12/10/2013  . Acute on chronic diastolic heart failure 12/10/2013  . Acute renal failure 12/10/2013  . Anemia 12/10/2013  . UTI (urinary tract infection) 12/10/2013  . Diabetes mellitus with hypoglycemia 12/10/2013  . Acute on chronic diastolic CHF (congestive heart failure) 12/10/2013  . Peripheral vascular disease 12/10/2013  . Acute on chronic diastolic congestive heart failure 12/10/2013  . H/O: CVA (cerebrovascular accident) 12/01/2013  . COPD (chronic obstructive pulmonary disease) 12/01/2013  . Acute pulmonary edema 12/01/2013  . DM type 2, controlled, with complication 12/01/2013  . Ischemic leg 11/24/2013  . Atrial fibrillation 11/24/2013  . Acute respiratory failure with hypoxia 11/24/2013  . Hypotension 11/24/2013    Expected Discharge Date: Expected Discharge Date: 01/03/14  Team Members Present: Physician leading conference: Dr. Claudette LawsAndrew Kirsteins Social Worker Present: Dossie DerBecky Alvine Mostafa, LCSW Nurse Present: Other (comment) Earnestine Mealing(Kom Avegno-RN) PT Present: Edman CircleAudra Hall, Clearnce HastenPT;Emily Parcell, PT OT Present: Rosalio LoudSarah Hoxie, Loistine ChanceT;Karen Pulaski, OT SLP Present: Fae PippinMelissa Bowie, SLP PPS Coordinator present : Tora DuckMarie Noel, RN, CRRN     Current  Status/Progress Goal Weekly Team Focus  Medical   progressing slowly, fluid management issues secondary to CHF  maintain stable fluid status  cardiology to manage CHF   Bowel/Bladder   Incontinent of bowel. LBM: 01/13/2014Has coude cath  Min assist  Timed toileting   Swallow/Nutrition/ Hydration     WFL        ADL's   mod assist transfers, supervision UB dressing, mod-total assist LB dressing. Pt limited by SOB and fatigue. Tues pt with decreased sitting balance and transfers requiring +2 transfer initially  supervision-min assist overall  activity tolerance/endurance, balance, LB bathing and dressing   Mobility   Min-mod A overall; gait very short distances with RW; limited by SOB, fatigue  supervision-min A overall  activity tolerance/endurance, balance, gait   Communication     [facility]        Safety/Cognition/ Behavioral Observations    No safety concerns        Pain   Denied pain  Managed pain at 3 or less  Assess for pain Q 4 hours   Skin   Bruise to left chest, penile wound, stage 2 to sacral area  No further skin breakdown/infection   Assess skin for breakdown/infection Q shift      *See Care Plan and progress notes for long and short-term goals.  Barriers to Discharge: see above, urinary retention    Possible Resolutions to Barriers:  see above    Discharge Planning/Teaching Needs:    Home with wife who is willing to assist but can not provide heavy assist.  Will need to have wife come in to do family education prior to discharge     Team Discussion:  Endurance and  balance better.  Better day today.  Making progress toward supervision/min level.  Voiding trial prior to d/c  Revisions to Treatment Plan:  None   Continued Need for Acute Rehabilitation Level of Care: The patient requires daily medical management by a physician with specialized training in physical medicine and rehabilitation for the following conditions: Daily direction of a multidisciplinary physical  rehabilitation program to ensure safe treatment while eliciting the highest outcome that is of practical value to the patient.: Yes Daily medical management of patient stability for increased activity during participation in an intensive rehabilitation regime.: Yes Daily analysis of laboratory values and/or radiology reports with any subsequent need for medication adjustment of medical intervention for : Cardiac problems;Other  Shaely Gadberry, Lemar Livings 12/27/2013, 2:15 PM

## 2013-12-27 NOTE — Progress Notes (Signed)
ANTICOAGULATION CONSULT NOTE - Follow Up Consult  Pharmacy Consult for Coumadin Indication: atrial fibrillation  No Known Allergies  Patient Measurements: Height: 5\' 7"  (170.2 cm) Weight: 169 lb 5 oz (76.8 kg) IBW/kg (Calculated) : 66.1  Vital Signs: Temp: 97.6 F (36.4 C) (01/14 0535) Temp src: Oral (01/14 0535) BP: 122/76 mmHg (01/14 0924) Pulse Rate: 103 (01/14 0551)  Labs:  Recent Labs  12/25/13 0555 12/26/13 0550 12/27/13 0555  LABPROT 26.6* 27.3* 31.3*  INR 2.55* 2.64* 3.16*  CREATININE 1.62*  --   --     Estimated Creatinine Clearance: 34 ml/min (by C-G formula based on Cr of 1.62).   Medications:  Scheduled:  . antiseptic oral rinse  15 mL Mouth Rinse q12n4p  . atorvastatin  10 mg Oral Daily  . bacitracin   Topical BID  . diltiazem  240 mg Oral Daily  . famotidine  20 mg Oral QHS  . furosemide  40 mg Oral BID  . glimepiride  1 mg Oral Q breakfast  . guaiFENesin  600 mg Oral BID  . hydrocerin   Topical BID  . insulin aspart  0-9 Units Subcutaneous TID WC  . insulin aspart  5 Units Subcutaneous TID WC  . levofloxacin  750 mg Oral Q48H  . linagliptin  5 mg Oral Daily  . multivitamin with minerals  1 tablet Oral Daily  . oxymetazoline  1 spray Each Nare BID  . sodium chloride  4 spray Each Nare 6 X Daily  . tamsulosin  0.4 mg Oral QPC supper  . Warfarin - Pharmacist Dosing Inpatient   Does not apply q1800    Assessment: 80yo male on Coumadin for AFib and acute RLE DVT. INR (3.16) increased to supratherapeutic range today. Pt also on D#12 Levofloxacin. No bleeding noted. H/o that he developed acute epistaxis on 01/02/124 treated with chemical cautery and nasal packing by Dr. Annalee GentaShoemaker with recommendations for continued antibiotic coverage. Epistaxis resolved. Last CBC done 12/21/13 was in normal limits.    Goal of Therapy:  INR 2-3 Monitor platelets by anticoagulation protocol: Yes   Plan:  No Coumadin today. Continue daily INR  [name] [name],  RPh Clinical Pharmacist Pager: [phone] 12/27/2013 9:58 AM

## 2013-12-27 NOTE — Progress Notes (Signed)
Physical Therapy Weekly Progress Note  Patient Details  Name: [name] [name] MRN: [id number] Date of Birth: [birth year]  Today's Date: 12/27/2013 Time: 1010-1109 Time Calculation (min): 59 min   Patient has made slow but steady progress and has met 0 of 4 short term goals.  Pt is currently mod A overall for bed mobility, bed <> w/c transfers and for gait very short distances with [name] and RW, and mod-max A for stair negotiation with two rails and [name].  Pt progress has been limited by multiple medical issues and significant deconditioning with decreased activity tolerance.  Pt continues to require multiple rest breaks during therapy sessions secondary to DOE and LE fatigue.  Upon discussion with OT it is felt that pt may require >12 days LOS to reach supervision-min A goals overall for D/C home with wife secondary to slow progress.    Patient continues to demonstrate the following deficits: decreased activity tolerance and cardiorespiratory endurance, impaired strength, balance, gait and therefore will continue to benefit from skilled PT intervention to enhance overall performance with activity tolerance, balance and functional use of  right lower extremity and left lower extremity.  Patient progressing toward long term goals..  Continue plan of care.  PT Short Term Goals Week 1:  PT Short Term Goal 1 (Week 1): Patient will perform supine <> sit with supervision PT Short Term Goal 1 - Progress (Week 1): Progressing toward goal PT Short Term Goal 2 (Week 1): Patient will perform sit to stand with supervision PT Short Term Goal 2 - Progress (Week 1): Progressing toward goal PT Short Term Goal 3 (Week 1): Patient will ambulate 50 feet with [name], RW, and min steady assist PT Short Term Goal 3 - Progress (Week 1): Progressing toward goal PT Short Term Goal 4 (Week 1): Patient will ambulate up and down 4 steps with 1 rail and mod assist PT Short Term Goal 4 - Progress (Week 1): Progressing toward  goal Week 2:  PT Short Term Goal 1 (Week 2): = LTG of supervision-min A overall with RW  Skilled Therapeutic Interventions/Progress Updates:   Pt more alert today with decreased DOE today.  Pt also reporting feeling stronger.  Performed gait and stair negotiation training; see below for details.  Continued to perform transfer training stand pivot to L and R x 2 reps each with RW with visual demonstration of sequence first and pt performing with mod-max A for lifting assistance from chair, assistance to pivot RW and assistance for controlled lowering to chair with max-total verbal cues for safe sequence.  Pt given multiple rest breaks for breathing and secondary to LE fatigue.  Returned to room and set up with all items within reach.     Therapy Documentation Precautions:  Precautions Precautions: Fall Precaution Comments: chronic left hemiplegia Required Braces or Orthoses: Other Brace/Splint Other Brace/Splint: patient's wife brought in [name] with metal uprights Restrictions Weight Bearing Restrictions: No Vital Signs: Therapy Vitals Pulse Rate: 110 BP: 122/76 mmHg Oxygen Therapy SpO2: 96 % O2 Device: None (Room air) Pain: Pain Assessment Pain Assessment: No/denies pain Locomotion : Ambulation Ambulation/Gait Assistance: 4: Min assist;3: Mod assist Ambulation Distance (Feet): 20 Feet Assistive device: Rolling walker;Other (Comment) ([name]) Ambulation/Gait Assistance Details: Required verbal cues and tactile cues to maintian upright trunk posture and verbal cues for full step and stride length.  As pt began to pivot to mat pt unable to safely pivot feet or RW and began to experience increased trunk flexion and pressure through UE; pt experienced  significant forward LOB and required total A to sit back onto mat.   High Level Ambulation High Level Ambulation: Backwards walking Backwards Walking: x 6ft with RW and mod A with verbal and tactile cues to maintain upright posture, lateral  weight shifting and full step length with LLE to maintain BOS under COG.  At end of 6ft pt experience posterior LOB and required total A to regain balance and perform controlled sit to chair.  Required assistance to advance RW backwards to maintain UE support. Stairs / Additional Locomotion Stairs: Yes Stairs Assistance: 3: Mod assist Stair Management Technique: Two rails;Step to pattern;Backwards;Forwards; ascending forwards and descending backwards secondary to inability to reach top of stairs to turn around.  Required verbal cues for safe step to sequence.   Number of Stairs: 2 (x 2) Height of Stairs: 4   See FIM for current functional status  Therapy/Group: Individual Therapy  Hall,  [place] 12/27/2013, 11:11 AM   

## 2013-12-27 NOTE — Progress Notes (Signed)
Occupational Therapy Session Note  Patient Details  Name: [name] [name] MRN: [id number] Date of Birth: 10-22-1931  Today's Date: 12/27/2013 Time: 0732-0830 and 1410-1435 Time Calculation (min): 58 min and 25 min  Short Term Goals: Week 1:  OT Short Term Goal 1 (Week 1): Patient will transfer to commode with mod assist OT Short Term Goal 2 (Week 1): Patient will wash lower body with mod assist OT Short Term Goal 3 (Week 1): Patient will dress lower body with max assist OT Short Term Goal 4 (Week 1): Patient will stand at sink for hygiene task with supervision  Skilled Therapeutic Interventions/Progress Updates:    1) Pt seen for ADL retraining with focus on bed mobility, transfers, sit <> stand, and use of AE to assist with LB bathing.  Pt in bed asleep upon arrival but easily aroused, pt reports feeling more alert today than yesterday.  Bed mobility mod assist with pt requiring physical assist to advance LLE to EOB and assist with sidelying to sit.  Used RW with stand pivot transfer to encourage upright standing balance and weight shift with sit > stand and decrease physical assist required for squat pivot at this time.  Engaged in bathing at sink with pt able to stand with min/steady assist each attempt with 1 episode of LLE buckling requiring mod assist to regain balance and return to sitting.  Pt demonstrated recall of long handled sponge to wash BLE.  Increased recall of routine with donning pants and socks this session with ability to cross leg over opposite knee with decreased cues and assist from previous sessions yesterday.  Pt more alert and cognitively with it this session, reports feeling "out of it" yesterday.    2) Pt seen for 1:1 OT with focus on activity tolerance/endurance and standing tolerance.  Performed sit > stand x4 with pt requiring min-mod assist with sit > stand with increased assist as he fatigued.  Pt completed simple table top activity in standing with min assist to  maintain standing and min-mod verbal cues for weight shifting as pt would hyperextend LLE and weight shift towards Rt as he fatigued.  Pt required rest breaks between each stand.    Therapy Documentation Precautions:  Precautions Precautions: Fall Precaution Comments: chronic left hemiplegia Required Braces or Orthoses: Other Brace/Splint Other Brace/Splint: patient's wife brought in [name] with metal uprights Restrictions Weight Bearing Restrictions: No General:   Vital Signs: Therapy Vitals Pulse Rate: 110 BP: 122/76 mmHg Oxygen Therapy SpO2: 96 % O2 Device: None (Room air) Pain: Pain Assessment Pain Assessment: No/denies pain  See FIM for current functional status  Therapy/Group: Individual Therapy  Rosalio LoudHOXIE, Raeven Pint 12/27/2013, 11:41 AM

## 2013-12-27 NOTE — Progress Notes (Signed)
Patient ID: [name] [name], male   DOB: [birth year], 80 y.o.   MRN: [id number]   SUBJECTIVE:  80 y/o male admitted with a/c respiratory failure in the setting of acute bronchitis and a/c diastolic HF.   Weight up 3 pounds.   Complains of fatigue. Denies SOB/Orthopnea/PND.   Creatinine 1.6>1.6 >1.4   . antiseptic oral rinse  15 mL Mouth Rinse q12n4p  . atorvastatin  10 mg Oral Daily  . bacitracin   Topical BID  . diltiazem  240 mg Oral Daily  . famotidine  20 mg Oral QHS  . furosemide  40 mg Oral BID  . glimepiride  1 mg Oral Q breakfast  . guaiFENesin  600 mg Oral BID  . hydrocerin   Topical BID  . insulin aspart  0-9 Units Subcutaneous TID WC  . insulin aspart  5 Units Subcutaneous TID WC  . levofloxacin  750 mg Oral Q48H  . linagliptin  5 mg Oral Daily  . multivitamin with minerals  1 tablet Oral Daily  . oxymetazoline  1 spray Each Nare BID  . sodium chloride  4 spray Each Nare 6 X Daily  . tamsulosin  0.4 mg Oral QPC supper  . Warfarin - Pharmacist Dosing Inpatient   Does not apply q1800    Filed Vitals:   12/26/13 1417 12/27/13 0535 12/27/13 0539 12/27/13 0551  BP: 105/68 127/74 105/70 114/70  Pulse: 71 99 95 103  Temp: 95.8 F (35.4 C) 97.6 F (36.4 C)    TempSrc: Axillary Oral    Resp: 18 17    Height:      Weight:   169 lb 5 oz (76.8 kg)   SpO2: 98% 94%      Intake/Output Summary (Last 24 hours) at 12/27/13 0920 Last data filed at 12/27/13 0541  Gross per 24 hour  Intake    480 ml  Output   1700 ml  Net  -1220 ml    LABS: Basic Metabolic Panel:  Recent Labs  29/51/8800/11/27 0555  NA 131*  K 4.1  CL 90*  CO2 27  GLUCOSE 148*  BUN 56*  CREATININE 1.62*  CALCIUM 9.3   Liver Function Tests: No results found for this basename: AST, ALT, ALKPHOS, BILITOT, PROT, ALBUMIN,  in the last 72 hours No results found for this basename: LIPASE, AMYLASE,  in the last 72 hours CBC: No results found for this basename: WBC, NEUTROABS, HGB, HCT, MCV, PLT,  in the last  72 hours Cardiac Enzymes: No results found for this basename: CKTOTAL, CKMB, CKMBINDEX, TROPONINI,  in the last 72 hours   PHYSICAL EXAM General: NAD. Elderly. Sitting in wheelchair.  Neck: JVP 6-7 no thyromegaly or thyroid nodule.  Lungs: Decreased in the bases CV: Nondisplaced PMI. Distant HS.Marland Kitchen. Heart irregular S1/S2, no S3/S4, no murmur. NO edema   Abdomen: Soft, nontender, no hepatosplenomegaly, no distention.  Neurologic: Alert and oriented x 3. LUE weakness and swelling;  Psych: Normal affect. Extremities: No clubbing or cyanosis.  GU: Foley   ASSESSMENT AND PLAN: 80 yo with history of chronic atrial fibrillation, COPD, and diastolic CHF admitted with respiratory failure, suspect combination of acute/chronic diastolic CHF and acute exacerbation of COPD.    1. Pulmonary: Stable.   2. Acute on chronic diastolic CHF: Volume status stable. Weight up 3 pounds. Restart lasix 40 mg daily.  Continue to follow Cr closely.   3. Atrial fibrillation with RVR: Controlled, on diltiazem CD. On coumadin, INR 3.1 4. Acute on chronic kidney  injury: Creatinine remains elevated. 1.6  5. Epistaxis: Packing removed 1/8 by ENT.  He has acute RLE DVT in posterior tibial vein. INR 3.1   CLEGG,AMY, NP-C  9:20 AM  Patient seen and examined with Tonye Becket, NP. We discussed all aspects of the encounter. I agree with the assessment and plan as stated above. He is improving steadily. Renal function improved. Volume up slightly. Restart lasix. Continue coumadin.   Daniel Bensimhon,MD 7:01 PM

## 2013-12-27 NOTE — Progress Notes (Signed)
Occupational Therapy Session Note  Patient Details  Name: Elie GoodyBobby Manganelli MRN: [id number] Date of Birth: [birth year]  Today's Date: 12/27/2013 Time: [id number] Time Calculation (min): 45 min  Short Term Goals: Week 1:  OT Short Term Goal 1 (Week 1): Patient will transfer to commode with mod assist OT Short Term Goal 2 (Week 1): Patient will wash lower body with mod assist OT Short Term Goal 3 (Week 1): Patient will dress lower body with max assist OT Short Term Goal 4 (Week 1): Patient will stand at sink for hygiene task with supervision  Skilled Therapeutic Interventions/Progress Updates:  Patient resting in w/c upon arrival.  Engaged in grooming tasks while standing and toilet transfers.  Focused session on activity tolerance, sit><stands, standing tolerance and balance, w/c><toilet transfers and toileting.  Patient performed 2 grooming tasks while standing at sink with min/steady assist to supervision and toilet transfers with mod assist and heavy use of grab bar.   Therapy Documentation Precautions:  Precautions Precautions: Fall Precaution Comments: chronic left hemiplegia Required Braces or Orthoses: Other Brace/Splint Other Brace/Splint: patient's wife brought in [name] with metal uprights Restrictions Weight Bearing Restrictions: No Pain: Pain Assessment Pain Assessment: No/denies pain ADL: See FIM for current functional status  Therapy/Group: Individual Therapy  Aleksey Newbern 12/27/2013, 12:41 PM

## 2013-12-27 NOTE — Progress Notes (Signed)
Social Work Patient ID: [name] [name], male   DOB: 09/24/1931, 80 y.o.   MRN: [id number] Met with pt to inform of team conference goals-supervision/min level and discharge 1/21.  He needs to be doing better before going home with wife. He does not want to be a burden to her.  Discussed will have wife come in and go through therapies with him prior to discharge.  He is agreeable to the Plan and having a much better day today.

## 2013-12-27 NOTE — Progress Notes (Signed)
Subjective/Complaints: 80 y.o. male with history of COPD--O2 at nights, h/o CVA with L-HP and memory deficits, chronic CHF, A fib with RVR as well as recent admission for ischemic RLE requiring R- femoral embolectomy due to embolic event. [name] on 12/10/13 with worsening of dyspnea, anasarca, acute renal failure and left foot pain. He was treated for acute on chronic renal failure with IV diuresis and started on antibiotics for AECOPD v/s viral bronchitis. Required BIPAP due to mucous plugging. Treated with nebs and steroids and respiratory status improving. He developed acute epistaxis on 01/02/124 treated with chemical cautery and nasal packing by Dr. Wilburn Cornelia with recommendations for continued antibiotic coverage. Follow up doppler with thrombus right posterior tibial vein and no DVT LLE  Drowsy this am but oriented x 3, recognizes me, remembers OT name   Objective: Vital Signs: Blood pressure 114/70, pulse 103, temperature 97.6 F (36.4 C), temperature source Oral, resp. rate 17, height 5' 7"  (1.702 m), weight 76.8 kg (169 lb 5 oz), SpO2 94.00%. No results found. Results for orders placed during the hospital encounter of 12/20/13 (from the past 72 hour(s))  GLUCOSE, CAPILLARY     Status: Abnormal   Collection Time    12/24/13 12:13 PM      Result Value Range   Glucose-Capillary 215 (*) 70 - 99 mg/dL   Comment 1 Notify RN    GLUCOSE, CAPILLARY     Status: Abnormal   Collection Time    12/24/13  4:43 PM      Result Value Range   Glucose-Capillary 144 (*) 70 - 99 mg/dL  GLUCOSE, CAPILLARY     Status: Abnormal   Collection Time    12/24/13  8:23 PM      Result Value Range   Glucose-Capillary 242 (*) 70 - 99 mg/dL  BASIC METABOLIC PANEL     Status: Abnormal   Collection Time    12/25/13  5:55 AM      Result Value Range   Sodium 131 (*) 137 - 147 mEq/L   Potassium 4.1  3.7 - 5.3 mEq/L   Chloride 90 (*) 96 - 112 mEq/L   CO2 27  19 - 32 mEq/L   Glucose, Bld 148 (*) 70 -  99 mg/dL   BUN 56 (*) 6 - 23 mg/dL   Creatinine, Ser 1.62 (*) 0.50 - 1.35 mg/dL   Calcium 9.3  8.4 - 10.5 mg/dL   GFR calc non Af Amer 38 (*) >90 mL/min   GFR calc Af Amer 45 (*) >90 mL/min   Comment: (NOTE)     The eGFR has been calculated using the CKD EPI equation.     This calculation has not been validated in all clinical situations.     eGFR's persistently <90 mL/min signify possible Chronic Kidney     Disease.  PROTIME-INR     Status: Abnormal   Collection Time    12/25/13  5:55 AM      Result Value Range   Prothrombin Time 26.6 (*) 11.6 - 15.2 seconds   INR 2.55 (*) 0.00 - 1.49  GLUCOSE, CAPILLARY     Status: Abnormal   Collection Time    12/25/13  7:16 AM      Result Value Range   Glucose-Capillary 159 (*) 70 - 99 mg/dL  GLUCOSE, CAPILLARY     Status: Abnormal   Collection Time    12/25/13 11:53 AM      Result Value Range   Glucose-Capillary 264 (*) 70 -  99 mg/dL  URINE CULTURE     Status: None   Collection Time    12/25/13  1:27 PM      Result Value Range   Specimen Description URINE, CATHETERIZED     Special Requests NONE     Culture  Setup Time       Value: 12/25/2013 17:23     Performed at [facility]       Value: NO GROWTH     Performed at [facility]   Culture       Value: NO GROWTH     Performed at [facility]   Report Status 12/26/2013 FINAL    URINALYSIS, ROUTINE W REFLEX MICROSCOPIC     Status: Abnormal   Collection Time    12/25/13  1:27 PM      Result Value Range   Color, Urine YELLOW  YELLOW   APPearance CLEAR  CLEAR   Specific Gravity, Urine 1.016  1.005 - 1.030   pH 5.0  5.0 - 8.0   Glucose, UA NEGATIVE  NEGATIVE mg/dL   Hgb urine dipstick SMALL (*) NEGATIVE   Bilirubin Urine NEGATIVE  NEGATIVE   Ketones, ur NEGATIVE  NEGATIVE mg/dL   Protein, ur NEGATIVE  NEGATIVE mg/dL   Urobilinogen, UA 0.2  0.0 - 1.0 mg/dL   Nitrite NEGATIVE  NEGATIVE   Leukocytes, UA NEGATIVE  NEGATIVE  URINE  MICROSCOPIC-ADD ON     Status: Abnormal   Collection Time    12/25/13  1:27 PM      Result Value Range   Squamous Epithelial / LPF FEW (*) RARE   WBC, UA 0-2  <3 WBC/hpf   RBC / HPF 0-2  <3 RBC/hpf   Bacteria, UA FEW (*) RARE   Casts HYALINE CASTS (*) NEGATIVE  GLUCOSE, CAPILLARY     Status: Abnormal   Collection Time    12/25/13  4:42 PM      Result Value Range   Glucose-Capillary 107 (*) 70 - 99 mg/dL  GLUCOSE, CAPILLARY     Status: Abnormal   Collection Time    12/25/13  8:43 PM      Result Value Range   Glucose-Capillary 171 (*) 70 - 99 mg/dL   Comment 1 Notify RN    PROTIME-INR     Status: Abnormal   Collection Time    12/26/13  5:50 AM      Result Value Range   Prothrombin Time 27.3 (*) 11.6 - 15.2 seconds   INR 2.64 (*) 0.00 - 1.49  GLUCOSE, CAPILLARY     Status: Abnormal   Collection Time    12/26/13  7:26 AM      Result Value Range   Glucose-Capillary 225 (*) 70 - 99 mg/dL   Comment 1 Notify RN    GLUCOSE, CAPILLARY     Status: Abnormal   Collection Time    12/26/13 12:01 PM      Result Value Range   Glucose-Capillary 190 (*) 70 - 99 mg/dL   Comment 1 Notify RN    GLUCOSE, CAPILLARY     Status: Abnormal   Collection Time    12/26/13  4:49 PM      Result Value Range   Glucose-Capillary 153 (*) 70 - 99 mg/dL   Comment 1 Notify RN    GLUCOSE, CAPILLARY     Status: Abnormal   Collection Time    12/26/13  8:47 PM  Result Value Range   Glucose-Capillary 180 (*) 70 - 99 mg/dL   Comment 1 Notify RN    PROTIME-INR     Status: Abnormal   Collection Time    12/27/13  5:55 AM      Result Value Range   Prothrombin Time 31.3 (*) 11.6 - 15.2 seconds   INR 3.16 (*) 0.00 - 1.49  GLUCOSE, CAPILLARY     Status: Abnormal   Collection Time    12/27/13  7:20 AM      Result Value Range   Glucose-Capillary 130 (*) 70 - 99 mg/dL   Comment 1 Notify RN       HEENT: normal, no evidence of epistaxis Cardio: irregular and no murmurs, rate controlled Resp: CTA B/L and  unlabored GI: BS positive and non distended Extremity:  Pulses absent Bilat pedal and R pop and No Edema Skin:   Intact but dry Neuro: Alert/Oriented, Cranial Nerve Abnormalities L central 7, Normal Sensory, Abnormal Motor 3/5 L deltoid, bi, tri, grip,L quad, HF 2- ankle DF,  and Abnormal FMC Ataxic/ dec FMC Musc/Skel:  Other no pain with UE and LE ROM Gen NAD   Assessment/Plan: 1. Functional deficits secondary to Deconditioning respiratory and renal failure as well as chronic Left Hemiparesis from CVA 12yr ago which require 3+ hours per day of interdisciplinary therapy in a comprehensive inpatient rehab setting. Physiatrist is providing close team supervision and 24 hour management of active medical problems listed below. Physiatrist and rehab team continue to assess barriers to discharge/monitor patient progress toward functional and medical goals. FIM: FIM - Bathing Bathing Steps Patient Completed: Chest;Right Arm;Left Arm;Abdomen;Right upper leg;Left upper leg;Front perineal area;Right lower leg (including foot);Left lower leg (including foot) Bathing: 4: Min-Patient completes 8-9 [id number] parts or 75+ percent  FIM - Upper Body Dressing/Undressing Upper body dressing/undressing steps patient completed: Thread/unthread right sleeve of pullover shirt/dresss;Thread/unthread left sleeve of pullover shirt/dress;Put head through opening of pull over shirt/dress;Pull shirt over trunk Upper body dressing/undressing: 5: Set-up assist to: Obtain clothing/put away FIM - Lower Body Dressing/Undressing Lower body dressing/undressing steps patient completed: Don/Doff left sock;Don/Doff right shoe;Fasten/unfasten right shoe Lower body dressing/undressing: 2: Max-Patient completed 25-49% of tasks  FIM - Toileting Toileting: 1: Total-Patient completed zero steps, helper did all 3  FIM - ToRadio producerevices: Grab bars;Elevated toilet seat Toilet Transfers: 3-To  toilet/BSC: Mod A (lift or lower assist);3-From toilet/BSC: Mod A (lift or lower assist)  FIM - Bed/Chair Transfer Bed/Chair Transfer Assistive Devices: WaAdult nurseransfer: 2: Supine > Sit: Max A (lifting assist/Pt. 25-49%);3: Sit > Supine: Mod A (lifting assist/Pt. 50-74%/lift 2 legs);2: Bed > Chair or W/C: Max A (lift and lower assist);2: Chair or W/C > Bed: Max A (lift and lower assist)  FIM - Locomotion: Wheelchair Locomotion: Wheelchair: 1: Total Assistance/staff pushes wheelchair (Pt<25%) FIM - Locomotion: Ambulation Locomotion: Ambulation Assistive Devices: Walker - Rolling;Orthosis Ambulation/Gait Assistance: 3: Mod assist Locomotion: Ambulation: 0: Activity did not occur  Comprehension Comprehension Mode: Auditory Comprehension: 5-Follows basic conversation/direction: With extra time/assistive device  Expression Expression Mode: Verbal Expression: 5-Expresses basic needs/ideas: With extra time/assistive device  Social Interaction Social Interaction: 6-Interacts appropriately with others with medication or extra time (anti-anxiety, antidepressant).  Problem Solving Problem Solving: 5-Solves basic 90% of the time/requires cueing < 10% of the time  Memory Memory: 5-Recognizes or recalls 90% of the time/requires cueing < 10% of the time  Medical Problem List and Plan:  Deconditioning related acute renal failure, hypoxia, and multiple  medical issues  1. RLE DVT/A fib/Anticoagulation: Pharmaceutical: Coumadin and Lovenox  2. Pain Management: prn tylenol    3. Mood: Will have LCSW follow for evaluation.  4. Neuropsych: This patient is capable of making decisions on his own behalf.  5. Epistaxis:resolved                                                                   6. Question multilobar PNA: Will check follow CXR. Antibiotic will do 14 day course. Has persistent cough  7. AECOPD: Respiratory status improving and slow taper of steroids. Continue nebs and IS.  8.  Acute on chronic CHF: Compensated--continue low salt diet with 1500 cc fluid restriction.Lasix held per cardiology Check daily weights. Daily monitoring of I's and O's   gfluid intake ~1036m/24 hr 9. A fib: monitor HR with bid checks. Continue diltiazem 240 mg daily.  10. GU: Foley, s/p paraphimosis reduction per GU,UA - 11. DM improving , glimipiride and linagliptin  LOS (Days) 7 A FACE TO FACE EVALUATION WAS PERFORMED  Sonam Huelsmann E 12/27/2013, 7:39 AM

## 2013-12-28 ENCOUNTER — Inpatient Hospital Stay (HOSPITAL_COMMUNITY): Payer: Medicare Other | Admitting: Occupational Therapy

## 2013-12-28 ENCOUNTER — Inpatient Hospital Stay (HOSPITAL_COMMUNITY): Payer: Medicare Other

## 2013-12-28 ENCOUNTER — Inpatient Hospital Stay (HOSPITAL_COMMUNITY): Payer: Medicare HMO | Admitting: Occupational Therapy

## 2013-12-28 LAB — GLUCOSE, CAPILLARY
GLUCOSE-CAPILLARY: 124 mg/dL — AB (ref 70–99)
GLUCOSE-CAPILLARY: 81 mg/dL (ref 70–99)
GLUCOSE-CAPILLARY: 98 mg/dL (ref 70–99)
Glucose-Capillary: 104 mg/dL — ABNORMAL HIGH (ref 70–99)

## 2013-12-28 LAB — PROTIME-INR
INR: 2.64 — AB (ref 0.00–1.49)
Prothrombin Time: 27.3 seconds — ABNORMAL HIGH (ref 11.6–15.2)

## 2013-12-28 MED ORDER — WARFARIN SODIUM 4 MG PO TABS
4.0000 mg | ORAL_TABLET | Freq: Once | ORAL | Status: AC
  Administered 2013-12-28: 4 mg via ORAL
  Filled 2013-12-28: qty 1

## 2013-12-28 NOTE — Progress Notes (Signed)
Occupational Therapy Session Note  Patient Details  Name: [name] GoodyBobby [name] MRN: [id number] Date of Birth: 07-23-1931  Today's Date: 12/28/2013 Time: 1100-1130 Time Calculation (min): 30 min  Short Term Goals: Week 2:  OT Short Term Goal 1 (Week 2): Patient will stand at sink for hygiene task with supervision OT Short Term Goal 2 (Week 2): Pt will wash LB with min assist using AE as needed OT Short Term Goal 3 (Week 2): Pt will dress LB with min assist using AE as needed OT Short Term Goal 4 (Week 2): Pt will complete toilet transfer with min assist using appropriate AD  Skilled Therapeutic Interventions/Progress Updates:  Upon arrival, patient resting in recliner with wife in the room.  Engaged in standing at sink for grooming tasks.  Focused session on activity tolerance, sit><stands, standing tolerance and standing balance and pursed lip breathing.  Patient able to stand at sink 2 times: 2 min 50 seconds then 2 min 10 sec.  First stand, patient leaning on counter most of the time.  Second stand, patient not leaning and practiced maintaining squat position while weight shifting left and right.  Patient with decreased postural control in sitting and standing.  Patient with "C" curve to the right->right shoulder down, right pelvis up and left rib cage flared.  This posture does not appear fixed as patient able to straighten up with facilitation.  Patient made comfortable in recliner and all items within reach.  Therapy Documentation Precautions:  Precautions Precautions: Fall Precaution Comments: chronic left hemiplegia Required Braces or Orthoses: Other Brace/Splint Other Brace/Splint: patient's wife brought in [name] with metal uprights Restrictions Weight Bearing Restrictions: No Pain: Denies pain, reports premedicated  Therapy/Group: Individual Therapy  Remas Sobel 12/28/2013, 1:39 PM

## 2013-12-28 NOTE — Progress Notes (Signed)
NUTRITION FOLLOW UP  Intervention:   1. General healthful diet; encourage intake of foods and beverages as able. RD to follow and assess for nutritional adequacy.  2. MVI daily   NUTRITION DIAGNOSIS:  Unintentional weight loss related to CHF as evidenced by >12% weight loss in less than one month.   Goal:  Pt to meet >/= 90% of their estimated nutrition needs, Met.    Monitor:  PO intake  Weight trends  Labs  Assessment:   80 y.o. male with history of CAD, PVD, atrial fibrillation on Coumadin, hypertension, DM 2, former smoker, CVA with residual left hemiparesis, was recently hospitalized at [facility] between 11/23/2013-12/05/2013 for ischemic right leg. Cardiology had consulted for A. Fib & acute on chronic diastolic CHF. He was discharged to [facility] in [place]. He presented to the [facility] ED with complaints of worsening dyspnea of 2 days' duration, worsening generalized body swelling, no chest pain or cough and left foot pain.  Pt has continued with excellent intake at 100% of meals.  His weight has increased to 172 lbs.  MD adjusting lasix. He is not requiring any nutrition supplements to support intake at this time.  Pt reports he is eating well- "cleaning my plate because your food is really good."  He does state it is taking him a few more minutes to eat due to not having his dentures but he is ordering appropriate foods and managing well.  Moderate malnutrition is resolving.   Height: Ht Readings from Last 1 Encounters:  12/20/13 5' 7"  (1.702 m)    Weight Status:   Wt Readings from Last 1 Encounters:  12/28/13 172 lb 1.6 oz (78.064 kg)    Re-estimated needs:  Kcal: 1900-2100  Protein: 95-105 grams  Fluid: 2.3 L/day  Skin: intact, generalized edema  Diet Order: Carb Control   Intake/Output Summary (Last 24 hours) at 12/28/13 1530 Last data filed at 12/28/13 1506  Gross per 24 hour  Intake    960 ml  Output   2550 ml  Net  -1590 ml     Last BM: 1/13   Labs:   Recent Labs Lab 12/24/13 0600 12/25/13 0555 12/27/13 0920  NA 133* 131* 130*  K 4.2 4.1 5.1  CL 92* 90* 91*  CO2 25 27 26   BUN 56* 56* 42*  CREATININE 1.61* 1.62* 1.47*  CALCIUM 9.4 9.3 9.4  GLUCOSE 215* 148* 182*    CBG (last 3)   Recent Labs  12/27/13 2131 12/28/13 0741 12/28/13 1154  GLUCAP 115* 124* 104*    Scheduled Meds: . antiseptic oral rinse  15 mL Mouth Rinse q12n4p  . atorvastatin  10 mg Oral Daily  . bacitracin   Topical BID  . diltiazem  240 mg Oral Daily  . famotidine  20 mg Oral QHS  . furosemide  40 mg Oral BID  . glimepiride  1 mg Oral Q breakfast  . guaiFENesin  600 mg Oral BID  . hydrocerin   Topical BID  . insulin aspart  0-9 Units Subcutaneous TID WC  . insulin aspart  5 Units Subcutaneous TID WC  . levofloxacin  750 mg Oral Q48H  . linagliptin  5 mg Oral Daily  . multivitamin with minerals  1 tablet Oral Daily  . oxymetazoline  1 spray Each Nare BID  . sodium chloride  4 spray Each Nare 6 X Daily  . tamsulosin  0.4 mg Oral QPC supper  . warfarin  4 mg Oral ONCE-1800  .  Warfarin - Pharmacist Dosing Inpatient   Does not apply q1800    Continuous Infusions:   Brynda Greathouse, MS RD LDN Clinical Inpatient Dietitian Pager: [phone] Weekend/After hours pager: [phone]

## 2013-12-28 NOTE — Progress Notes (Signed)
Occupational Therapy Weekly Progress Note  Patient Details  Name: [name] [name] MRN: [id number] Date of Birth: 1931-05-04  Today's Date: 12/28/2013 Time: 0703-0800 Time Calculation (min): 57 min  Patient has met 3 of 4 short term goals.  Pt is making slow but steady progress towards goals. Pt is currently mod assist overall for bed mobility, stand pivot transfers with UE support, and max assist for transfers without UE support.  Pt is supervision with UB dressing, and mod assist with LB dressing with mod-max verbal cues for sequencing of LB dressing.  Pt progress has been limited by multiple medical issues and significant deconditioning with decreased activity tolerance. Pt continues to require multiple rest breaks during therapy sessions secondary to DOE and BLE fatigue. Treatment team feels that pt may require >12 days LOS to reach supervision-min A goals overall for D/C home with wife secondary to slow progress.   Patient continues to demonstrate the following deficits: decreased activity tolerance and cardiorespiratory endurance, impaired strength, balance and therefore will continue to benefit from skilled OT intervention to enhance overall performance with BADL and Reduce care partner burden.  Patient progressing toward long term goals..  Continue plan of care.  OT Short Term Goals Week 1:  OT Short Term Goal 1 (Week 1): Patient will transfer to commode with mod assist OT Short Term Goal 1 - Progress (Week 1): Met OT Short Term Goal 2 (Week 1): Patient will wash lower body with mod assist OT Short Term Goal 2 - Progress (Week 1): Met OT Short Term Goal 3 (Week 1): Patient will dress lower body with max assist OT Short Term Goal 3 - Progress (Week 1): Met OT Short Term Goal 4 (Week 1): Patient will stand at sink for hygiene task with supervision OT Short Term Goal 4 - Progress (Week 1): Progressing toward goal Week 2:  OT Short Term Goal 1 (Week 2): Patient will stand at sink for hygiene  task with supervision OT Short Term Goal 2 (Week 2): Pt will wash LB with min assist using AE as needed OT Short Term Goal 3 (Week 2): Pt will dress LB with min assist using AE as needed OT Short Term Goal 4 (Week 2): Pt will complete toilet transfer with min assist using appropriate AD  Skilled Therapeutic Interventions/Progress Updates:    Pt seen for ADL retraining with focus on bed mobility, transfers, sit <> stand, and use of AE to assist with LB bathing. Pt in bed asleep upon arrival but easily aroused. Bed mobility to pt's Rt this session with min assist with pt requiring physical assist only with sidelying to sit. Used RW with stand pivot transfer to encourage upright standing balance and weight shift with sit > stand and decrease physical assist required for squat pivot at this time. Pt with hyperextension in Lt knee in standing this session, engaged in weight shifting to promote midline stance and decrease hyperextension.  Engaged in bathing at sink with pt able to stand with min/steady assist each attempt. Increased recall of routine with donning pants and socks this session with ability to cross leg over opposite knee with decreased cues, pt unable to cross RLE over Lt knee this session requiring physical assist with threading pants.   Therapy Documentation Precautions:  Precautions Precautions: Fall Precaution Comments: chronic left hemiplegia Required Braces or Orthoses: Other Brace/Splint Other Brace/Splint: patient's wife brought in [name] with metal uprights Restrictions Weight Bearing Restrictions: No General:   Vital Signs: Therapy Vitals Temp: 97.4 F (36.3  C) Temp src: Oral Pulse Rate: 94 Resp: 19 BP: 111/69 mmHg Patient Position, if appropriate: Standing Oxygen Therapy SpO2: 92 % O2 Device: None (Room air) Pain:  Pt reports pain in LLE at rest.  See FIM for current functional status  Therapy/Group: Individual Therapy  [name] [name] 12/28/2013, 8:01 AM

## 2013-12-28 NOTE — Progress Notes (Signed)
Subjective/Complaints: 80 y.o. male with history of COPD--O2 at nights, h/o CVA with L-HP and memory deficits, chronic CHF, A fib with RVR as well as recent admission for ischemic RLE requiring R- femoral embolectomy due to embolic event. [name] on 12/10/13 with worsening of dyspnea, anasarca, acute renal failure and left foot pain. He was treated for acute on chronic renal failure with IV diuresis and started on antibiotics for AECOPD v/s viral bronchitis. Required BIPAP due to mucous plugging. Treated with nebs and steroids and respiratory status improving. He developed acute epistaxis on 01/02/124 treated with chemical cautery and nasal packing by Dr. Wilburn Cornelia with recommendations for continued antibiotic coverage. Follow up doppler with thrombus right posterior tibial vein and no DVT LLE  Slept very well, look alert without c/o of dizziness in therapy Appreciate cardiology note  Review of Systems - Negative except occ cough Objective: Vital Signs: Blood pressure 104/74, pulse 94, temperature 97.4 F (36.3 C), temperature source Oral, resp. rate 19, height 5' 7"  (1.702 m), weight 79.1 kg (174 lb 6.1 oz), SpO2 92.00%. No results found. Results for orders placed during the hospital encounter of 12/20/13 (from the past 72 hour(s))  GLUCOSE, CAPILLARY     Status: Abnormal   Collection Time    12/25/13 11:53 AM      Result Value Range   Glucose-Capillary 264 (*) 70 - 99 mg/dL  URINE CULTURE     Status: None   Collection Time    12/25/13  1:27 PM      Result Value Range   Specimen Description URINE, CATHETERIZED     Special Requests NONE     Culture  Setup Time       Value: 12/25/2013 17:23     Performed at [facility]       Value: NO GROWTH     Performed at [facility]   Culture       Value: NO GROWTH     Performed at [facility]   Report Status 12/26/2013 FINAL    URINALYSIS, ROUTINE W REFLEX MICROSCOPIC     Status: Abnormal   Collection Time    12/25/13  1:27 PM      Result Value Range   Color, Urine YELLOW  YELLOW   APPearance CLEAR  CLEAR   Specific Gravity, Urine 1.016  1.005 - 1.030   pH 5.0  5.0 - 8.0   Glucose, UA NEGATIVE  NEGATIVE mg/dL   Hgb urine dipstick SMALL (*) NEGATIVE   Bilirubin Urine NEGATIVE  NEGATIVE   Ketones, ur NEGATIVE  NEGATIVE mg/dL   Protein, ur NEGATIVE  NEGATIVE mg/dL   Urobilinogen, UA 0.2  0.0 - 1.0 mg/dL   Nitrite NEGATIVE  NEGATIVE   Leukocytes, UA NEGATIVE  NEGATIVE  URINE MICROSCOPIC-ADD ON     Status: Abnormal   Collection Time    12/25/13  1:27 PM      Result Value Range   Squamous Epithelial / LPF FEW (*) RARE   WBC, UA 0-2  <3 WBC/hpf   RBC / HPF 0-2  <3 RBC/hpf   Bacteria, UA FEW (*) RARE   Casts HYALINE CASTS (*) NEGATIVE  GLUCOSE, CAPILLARY     Status: Abnormal   Collection Time    12/25/13  4:42 PM      Result Value Range   Glucose-Capillary 107 (*) 70 - 99 mg/dL  GLUCOSE, CAPILLARY     Status: Abnormal   Collection Time  12/25/13  8:43 PM      Result Value Range   Glucose-Capillary 171 (*) 70 - 99 mg/dL   Comment 1 Notify RN    PROTIME-INR     Status: Abnormal   Collection Time    12/26/13  5:50 AM      Result Value Range   Prothrombin Time 27.3 (*) 11.6 - 15.2 seconds   INR 2.64 (*) 0.00 - 1.49  GLUCOSE, CAPILLARY     Status: Abnormal   Collection Time    12/26/13  7:26 AM      Result Value Range   Glucose-Capillary 225 (*) 70 - 99 mg/dL   Comment 1 Notify RN    GLUCOSE, CAPILLARY     Status: Abnormal   Collection Time    12/26/13 12:01 PM      Result Value Range   Glucose-Capillary 190 (*) 70 - 99 mg/dL   Comment 1 Notify RN    GLUCOSE, CAPILLARY     Status: Abnormal   Collection Time    12/26/13  4:49 PM      Result Value Range   Glucose-Capillary 153 (*) 70 - 99 mg/dL   Comment 1 Notify RN    GLUCOSE, CAPILLARY     Status: Abnormal   Collection Time    12/26/13  8:47 PM      Result Value Range   Glucose-Capillary 180 (*) 70 -  99 mg/dL   Comment 1 Notify RN    PROTIME-INR     Status: Abnormal   Collection Time    12/27/13  5:55 AM      Result Value Range   Prothrombin Time 31.3 (*) 11.6 - 15.2 seconds   INR 3.16 (*) 0.00 - 1.49  GLUCOSE, CAPILLARY     Status: Abnormal   Collection Time    12/27/13  7:20 AM      Result Value Range   Glucose-Capillary 130 (*) 70 - 99 mg/dL   Comment 1 Notify RN    BASIC METABOLIC PANEL     Status: Abnormal   Collection Time    12/27/13  9:20 AM      Result Value Range   Sodium 130 (*) 137 - 147 mEq/L   Potassium 5.1  3.7 - 5.3 mEq/L   Chloride 91 (*) 96 - 112 mEq/L   CO2 26  19 - 32 mEq/L   Glucose, Bld 182 (*) 70 - 99 mg/dL   BUN 42 (*) 6 - 23 mg/dL   Creatinine, Ser 1.47 (*) 0.50 - 1.35 mg/dL   Calcium 9.4  8.4 - 10.5 mg/dL   GFR calc non Af Amer 43 (*) >90 mL/min   GFR calc Af Amer 50 (*) >90 mL/min   Comment: (NOTE)     The eGFR has been calculated using the CKD EPI equation.     This calculation has not been validated in all clinical situations.     eGFR's persistently <90 mL/min signify possible Chronic Kidney     Disease.  GLUCOSE, CAPILLARY     Status: Abnormal   Collection Time    12/27/13 11:34 AM      Result Value Range   Glucose-Capillary 149 (*) 70 - 99 mg/dL   Comment 1 Notify RN    GLUCOSE, CAPILLARY     Status: None   Collection Time    12/27/13  5:05 PM      Result Value Range   Glucose-Capillary 91  70 - 99 mg/dL  Comment 1 Notify RN    GLUCOSE, CAPILLARY     Status: Abnormal   Collection Time    12/27/13  9:31 PM      Result Value Range   Glucose-Capillary 115 (*) 70 - 99 mg/dL  PROTIME-INR     Status: Abnormal   Collection Time    12/28/13  4:50 AM      Result Value Range   Prothrombin Time 27.3 (*) 11.6 - 15.2 seconds   INR 2.64 (*) 0.00 - 1.49  GLUCOSE, CAPILLARY     Status: Abnormal   Collection Time    12/28/13  7:41 AM      Result Value Range   Glucose-Capillary 124 (*) 70 - 99 mg/dL   Comment 1 Notify RN       HEENT:  normal, no evidence of epistaxis Cardio: irregular and no murmurs, rate controlled Resp: CTA B/L and unlabored GI: BS positive and non distended Extremity:  Pulses absent Bilat pedal and R pop and No Edema Skin:   Intact but dry Neuro: Alert/Oriented, Cranial Nerve Abnormalities L central 7, Normal Sensory, Abnormal Motor 3/5 L deltoid, bi, tri, grip,L quad, HF 2- ankle DF,  and Abnormal FMC Ataxic/ dec FMC Musc/Skel:  Other no pain with UE and LE ROM Gen NAD   Assessment/Plan: 1. Functional deficits secondary to Deconditioning respiratory and renal failure as well as chronic Left Hemiparesis from CVA 33yr ago which require 3+ hours per day of interdisciplinary therapy in a comprehensive inpatient rehab setting. Physiatrist is providing close team supervision and 24 hour management of active medical problems listed below. Physiatrist and rehab team continue to assess barriers to discharge/monitor patient progress toward functional and medical goals. FIM: FIM - Bathing Bathing Steps Patient Completed: Chest;Right Arm;Left Arm;Abdomen;Right upper leg;Left upper leg;Front perineal area;Buttocks Bathing: 4: Min-Patient completes 8-9 [id number] parts or 75+ percent  FIM - Upper Body Dressing/Undressing Upper body dressing/undressing steps patient completed: Thread/unthread right sleeve of pullover shirt/dresss;Thread/unthread left sleeve of pullover shirt/dress;Put head through opening of pull over shirt/dress;Pull shirt over trunk Upper body dressing/undressing: 5: Set-up assist to: Obtain clothing/put away FIM - Lower Body Dressing/Undressing Lower body dressing/undressing steps patient completed: Thread/unthread left underwear leg;Thread/unthread left pants leg;Pull pants up/down;Don/Doff right sock;Don/Doff left sock;Don/Doff right shoe;Fasten/unfasten right shoe Lower body dressing/undressing: 3: Mod-Patient completed 50-74% of tasks  FIM - Toileting Toileting steps completed by patient:  Adjust clothing prior to toileting Toileting Assistive Devices: Grab bar or rail for support Toileting: 2: Max-Patient completed 1 of 3 steps  FIM - ToRadio producerevices: Grab bars;Elevated toilet seat Toilet Transfers: 3-To toilet/BSC: Mod A (lift or lower assist);3-From toilet/BSC: Mod A (lift or lower assist)  FIM - Bed/Chair Transfer Bed/Chair Transfer Assistive Devices: WaCopy4: Supine > Sit: Min A (steadying Pt. > 75%/lift 1 leg);3: Bed > Chair or W/C: Mod A (lift or lower assist)  FIM - Locomotion: Wheelchair Locomotion: Wheelchair: 1: Total Assistance/staff pushes wheelchair (Pt<25%) FIM - Locomotion: Ambulation Locomotion: Ambulation Assistive Devices: Walker - Rolling;Orthosis Ambulation/Gait Assistance: 4: Min assist;3: Mod assist Locomotion: Ambulation: 1: Travels less than 50 ft with moderate assistance (Pt: 50 - 74%)  Comprehension Comprehension Mode: Auditory Comprehension: 5-Understands basic 90% of the time/requires cueing < 10% of the time  Expression Expression Mode: Verbal Expression: 5-Expresses basic needs/ideas: With extra time/assistive device  Social Interaction Social Interaction: 6-Interacts appropriately with others with medication or extra time (anti-anxiety, antidepressant).  Problem Solving Problem Solving: 5-Solves basic 90% of  the time/requires cueing < 10% of the time  Memory Memory: 5-Recognizes or recalls 90% of the time/requires cueing < 10% of the time  Medical Problem List and Plan:  Deconditioning related acute renal failure, hypoxia, and multiple medical issues  1. RLE DVT/A fib/Anticoagulation: Pharmaceutical: Coumadin and Lovenox  2. Pain Management: prn tylenol    3. Mood: Will have LCSW follow for evaluation.  4. Neuropsych: This patient is capable of making decisions on his own behalf.  5. Epistaxis:resolved                                                                   6.  Question multilobar PNA: Will check follow CXR. Antibiotic will do 14 day course. Has persistent cough  7. AECOPD: Respiratory status improving and slow taper of steroids. Continue nebs and IS.  8. Acute on chronic CHF: Compensated--continue low salt diet with 1500 cc fluid restriction.Lasix held per cardiology Check daily weights. Daily monitoring of I's and O's   fluid intake ~1067m/24 hr 9. A fib: monitor HR with bid checks. Continue diltiazem 240 mg daily.  10. GU: Foley, s/p paraphimosis reduction per GU,UA - 11. DM improving , glimipiride and linagliptin  LOS (Days) 8 A FACE TO FACE EVALUATION WAS PERFORMED  KIRSTEINS,ANDREW E 12/28/2013, 9:54 AM

## 2013-12-28 NOTE — Progress Notes (Signed)
ANTICOAGULATION CONSULT NOTE - Follow Up Consult  Pharmacy Consult for Coumadin Indication: atrial fibrillation  No Known Allergies  Patient Measurements: Height: 5\' 7"  (170.2 cm) Weight: 174 lb 6.1 oz (79.1 kg) IBW/kg (Calculated) : 66.1  Vital Signs: Temp: 97.4 F (36.3 C) (01/15 0424) Temp src: Oral (01/15 0424) BP: 104/74 mmHg (01/15 0833) Pulse Rate: 94 (01/15 0424)  Labs:  Recent Labs  12/26/13 0550 12/27/13 0555 12/27/13 0920 12/28/13 0450  LABPROT 27.3* 31.3*  --  27.3*  INR 2.64* 3.16*  --  2.64*  CREATININE  --   --  1.47*  --     Estimated Creatinine Clearance: 37.5 ml/min (by C-G formula based on Cr of 1.47).   Medications:  Scheduled:  . antiseptic oral rinse  15 mL Mouth Rinse q12n4p  . atorvastatin  10 mg Oral Daily  . bacitracin   Topical BID  . diltiazem  240 mg Oral Daily  . famotidine  20 mg Oral QHS  . furosemide  40 mg Oral BID  . glimepiride  1 mg Oral Q breakfast  . guaiFENesin  600 mg Oral BID  . hydrocerin   Topical BID  . insulin aspart  0-9 Units Subcutaneous TID WC  . insulin aspart  5 Units Subcutaneous TID WC  . levofloxacin  750 mg Oral Q48H  . linagliptin  5 mg Oral Daily  . multivitamin with minerals  1 tablet Oral Daily  . oxymetazoline  1 spray Each Nare BID  . sodium chloride  4 spray Each Nare 6 X Daily  . tamsulosin  0.4 mg Oral QPC supper  . Warfarin - Pharmacist Dosing Inpatient   Does not apply q1800    Assessment: 80yo male on Coumadin for AFib and acute RLE DVT. INR (2.64) therapeutic. Will stop levaquin after today's dose. No bleeding noted.   Goal of Therapy:  INR 2-3 Monitor platelets by anticoagulation protocol: Yes   Plan:  Coumadin 4mg  po x 1. Continue daily INR  Bayard HuggerMei Vi Whitesel, PharmD, BCPS  Clinical Pharmacist  Pager: [phone]  12/28/2013 12:24 PM

## 2013-12-28 NOTE — Progress Notes (Signed)
Physical Therapy Note  Patient Details  Name: [name] [name] MRN: [id number] Date of Birth: 1931-10-31 Today's Date: 12/28/2013  8:30 - 9:30 60 minutes Individual session Patient denies pain. Patient did c/o left leg pain with ambulation. Unable to give a number. Patient sitting in wheelchair upon entering room. Patient pushed by staff to gym. Patient exercised facing Kinetron for 3 sets of 12 reps working on LE extension and aerobic activity. Patient sit to stand with mod assist and ambulated with rolling walker and left [name] 5 feet. Patient reported pain in left knee and needing to sit. Patient worked on sit to stand from wheelchair to rolling walker and maintaining standing x 1 minute x 5 reps each. Patient required minimal cueing for sequencing but able to perform sit to stand with min steady assist - no lifting. Patient performed stand  Pivot transfers wheelchair <> mat x 2 with min steady assist. Patient required rest breaks in between each activity. Patient pushed back to room in wheelchair. Patient performed stand pivot transfer with RW wheelchair to recliner with min steady assist. Patient left in recliner with all items in reach.  1:45 - 2:30 45 minutes Individual session Patient denies pain.   Patient sitting in recliner upon entering room. Patient mod assist sit to stand from recliner and transfer to wheelchair. [name] present so she observed therapy session. Patient performed transfer from wheelchair to regular double bed in simulate home setting with min steady assist. Patient performed transfer from wheelchair to simulated car with min steady assist. Patient did require assist to lift left LE into car. Patient transferred wheelchair to hospital bed with min steady assist and RW. Patient sit to supine with min assist for left LE. Patient left in bed with all items in reach.   Arelia LongestWindsor, [name] [name] M 12/28/2013, 2:58 PM

## 2013-12-29 ENCOUNTER — Inpatient Hospital Stay (HOSPITAL_COMMUNITY): Payer: Medicare Other | Admitting: Physical Therapy

## 2013-12-29 ENCOUNTER — Inpatient Hospital Stay (HOSPITAL_COMMUNITY): Payer: Medicare Other | Admitting: Occupational Therapy

## 2013-12-29 DIAGNOSIS — R5381 Other malaise: Secondary | ICD-10-CM

## 2013-12-29 DIAGNOSIS — G8110 Spastic hemiplegia affecting unspecified side: Secondary | ICD-10-CM

## 2013-12-29 LAB — BASIC METABOLIC PANEL
BUN: 33 mg/dL — ABNORMAL HIGH (ref 6–23)
CHLORIDE: 93 meq/L — AB (ref 96–112)
CO2: 28 meq/L (ref 19–32)
CREATININE: 1.45 mg/dL — AB (ref 0.50–1.35)
Calcium: 9.3 mg/dL (ref 8.4–10.5)
GFR calc non Af Amer: 44 mL/min — ABNORMAL LOW (ref >90)
GFR, EST AFRICAN AMERICAN: 51 mL/min — AB (ref >90)
Glucose, Bld: 58 mg/dL — ABNORMAL LOW (ref 70–99)
POTASSIUM: 4.7 meq/L (ref 3.7–5.3)
Sodium: 133 mEq/L — ABNORMAL LOW (ref 137–147)

## 2013-12-29 LAB — PROTIME-INR
INR: 2.08 — AB (ref 0.00–1.49)
PROTHROMBIN TIME: 22.7 s — AB (ref 11.6–15.2)

## 2013-12-29 LAB — GLUCOSE, CAPILLARY
GLUCOSE-CAPILLARY: 108 mg/dL — AB (ref 70–99)
Glucose-Capillary: 70 mg/dL (ref 70–99)
Glucose-Capillary: 265 mg/dL — ABNORMAL HIGH (ref 70–99)
Glucose-Capillary: 132 mg/dL — ABNORMAL HIGH (ref 70–99)

## 2013-12-29 MED ORDER — WARFARIN SODIUM 6 MG PO TABS
6.0000 mg | ORAL_TABLET | Freq: Once | ORAL | Status: AC
  Administered 2013-12-29: 6 mg via ORAL
  Filled 2013-12-29: qty 1

## 2013-12-29 NOTE — Progress Notes (Signed)
Social Work Patient ID: [name] [name], male   DOB: 1931-07-19, 80 y.o.   MRN: [id number] Met with pt and left a message for wife to plan on family education on Tuesday to make sure comfortable with pt's care and discharge Wed. Wife was here today and did some education according to Audra-PT, but will need more.  Pt reports: " You just missed her."  Will talk with her Monday.

## 2013-12-29 NOTE — Progress Notes (Signed)
ANTICOAGULATION CONSULT NOTE - Follow Up Consult  Pharmacy Consult for Coumadin Indication: atrial fibrillation  No Known Allergies  Patient Measurements: Height: 5\' 7"  (170.2 cm) Weight: 170 lb 3.1 oz (77.2 kg) IBW/kg (Calculated) : 66.1  Vital Signs: Temp: 97.4 F (36.3 C) (01/16 0454) BP: 129/92 mmHg (01/16 0900) Pulse Rate: 84 (01/16 0925)  Labs:  Recent Labs  12/27/13 0555 12/27/13 0920 12/28/13 0450 12/29/13 0545  LABPROT 31.3*  --  27.3* 22.7*  INR 3.16*  --  2.64* 2.08*  CREATININE  --  1.47*  --  1.45*    Estimated Creatinine Clearance: 38 ml/min (by C-G formula based on Cr of 1.45).   Medications:  Scheduled:  . antiseptic oral rinse  15 mL Mouth Rinse q12n4p  . atorvastatin  10 mg Oral Daily  . bacitracin   Topical BID  . diltiazem  240 mg Oral Daily  . famotidine  20 mg Oral QHS  . furosemide  40 mg Oral BID  . glimepiride  1 mg Oral Q breakfast  . guaiFENesin  600 mg Oral BID  . hydrocerin   Topical BID  . insulin aspart  0-9 Units Subcutaneous TID WC  . insulin aspart  5 Units Subcutaneous TID WC  . linagliptin  5 mg Oral Daily  . multivitamin with minerals  1 tablet Oral Daily  . oxymetazoline  1 spray Each Nare BID  . sodium chloride  4 spray Each Nare 6 X Daily  . tamsulosin  0.4 mg Oral QPC supper  . Warfarin - Pharmacist Dosing Inpatient   Does not apply q1800    Assessment: 80yo male on Coumadin for AFib and acute RLE DVT. INR 2.64 > 2.07 therapeutic but trending down. Levaquin stopped. No bleeding noted.    Goal of Therapy:  INR 2-3 Monitor platelets by anticoagulation protocol: Yes   Plan:  Coumadin 6mg  po x 1. Continue daily INR  Bayard HuggerMei Leydy Worthey, PharmD, BCPS  Clinical Pharmacist  Pager: [phone]  12/29/2013 11:19 AM

## 2013-12-29 NOTE — Progress Notes (Signed)
Discussed with Marissa NestlePam Love, PA  Patient with orthostatic hypotension, heart rate elevated, oxygen saturation down to 83% with quick recovery at rest to 97%, see vital signs flow sheet for details. No new orders given at this time. Roberts-VonCannon, Rumaisa Schnetzer Elon JesterMichele

## 2013-12-29 NOTE — Progress Notes (Addendum)
Physical Therapy Session Note  Patient Details  Name: [name] [name] MRN: [id number] Date of Birth: 1931/05/28  Today's Date: 12/29/2013 Time: 4098-1191 and 1507-1600 Time Calculation (min): 46 min and 53 min  Short Term Goals: Week 2:  PT Short Term Goal 1 (Week 2): = LTG of supervision-min A overall with RW  Skilled Therapeutic Interventions/Progress Updates:   Pt reporting that wife feels pt is making good progress.  Pt reports feeling stronger today.  Transported to gym in w/c total A.  Performed stand pivot transfers w/c <> Nustep with RW and min-mod A with slight lifting assistance needed to stand fully from w/c and pt only required one verbal cues for full pivot with RW prior to sitting on Nustep.  Performed bilat UE and LE strengthening and endurance at level 5 resistance x 6 minutes + 2 minutes more with bilat LE only for extensor strengthening with one rest break to assess Sp02.  Following exercise on Nustep performed stair negotiation training with 1 rail on L.  Demonstrated to pt stair negotiation laterally with L rail.  Pt gave repeat demonstration up/down 3 short stairs with bilat UE support on L rail ascending and descending laterally with step to sequencing and mod A to assist with LLE clearance and placement on step and verbal cues for safe sequence.  Pt Sp02 did drop to 80% following stairs but able to increase to 90% with deep breathing.  Returned to w/c and to room to rest.  Pt left with all items within reach.   PM session: Pt reporting needing to be in bed at end of session secondary to nursing need to examine foley catheter.  Transported to gym in w/c total A.  Performed transfer to mat stand pivot with RW mod A.  Performed supine <> sit/bed mobility training.  Pt able to verbalize efficient sequence but when asked to demonstrate pt performed sit > supine by throwing himself back on the bed and then gradually lifting each LE on the bed and performing multiple scoots to reposition  in bed.  For supine > sit pt threw his legs over the edge of the mat and attempted to perform straight sit up from mat.  Returned pt to supine and verbally discussed efficient supine <> sit sequence.  Guided pt through one sequence of supine <> sit with pt rolling first, lowering bilat LE off mat and then pushing to sit upright with manual facilitation for pelvic depression.  For sit > supine educated pt on use of RLE hooked under LLE to lift it onto mat with multiple repetitions.  Had pt perform second repetition with decreased verbal cues for recall.  Pt attempted to perform sit up again and continued to require max cuing to recall and perform efficient sequence.  Pt required mod A overall for supine <> sit.  Following bed mobility training performed gait x 10' with RW and min A overall with improved step and stride length overall and improved sequence with pivoting.  Returned to room and to bed with mod A overall.  RN notified pt back in bed.    Therapy Documentation Precautions:  Precautions Precautions: Fall Precaution Comments: chronic left hemiplegia Required Braces or Orthoses: Other Brace/Splint Other Brace/Splint: patient's wife brought in [name] with metal uprights Restrictions Weight Bearing Restrictions: No Vital Signs: Therapy Vitals Temp:  (temp. wouldn't register, oral or axillary) Pulse Rate: 84 Resp: 20 BP: 129/92 mmHg (RN notified) Patient Position, if appropriate: Standing Oxygen Therapy SpO2: 93 % (with activity) O2  Device: None (Room air) Pain: Pain Assessment Pain Assessment: No/denies pain Pain Score: 0-No pain Patients Stated Pain Goal: 3 Multiple Pain Sites: No  See FIM for current functional status  Therapy/Group: Individual Therapy  Edman CircleHall, Wirt Hemmerich Domin[facility] HospitalFaucette 12/29/2013, 12:13 PM

## 2013-12-29 NOTE — Progress Notes (Signed)
Subjective/Complaints: 80 y.o. male with history of COPD--O2 at nights, h/o CVA with L-HP and memory deficits, chronic CHF, A fib with RVR as well as recent admission for ischemic RLE requiring R- femoral embolectomy due to embolic event. [name] on 12/10/13 with worsening of dyspnea, anasarca, acute renal failure and left foot pain. He was treated for acute on chronic renal failure with IV diuresis and started on antibiotics for AECOPD v/s viral bronchitis. Required BIPAP due to mucous plugging. Treated with nebs and steroids and respiratory status improving. He developed acute epistaxis on 01/02/124 treated with chemical cautery and nasal packing by Dr. Wilburn Cornelia with recommendations for continued antibiotic coverage. Follow up doppler with thrombus right posterior tibial vein and no DVT LLE  Slept ok No pain c/o, no SOB Discussed foley and antibiotics  Review of Systems - Negative except occ cough Objective: Vital Signs: Blood pressure 118/68, pulse 94, temperature 97.4 F (36.3 C), temperature source Oral, resp. rate 18, height 5' 7"  (1.702 m), weight 77.2 kg (170 lb 3.1 oz), SpO2 96.00%. No results found. Results for orders placed during the hospital encounter of 12/20/13 (from the past 72 hour(s))  GLUCOSE, CAPILLARY     Status: Abnormal   Collection Time    12/26/13  7:26 AM      Result Value Range   Glucose-Capillary 225 (*) 70 - 99 mg/dL   Comment 1 Notify RN    GLUCOSE, CAPILLARY     Status: Abnormal   Collection Time    12/26/13 12:01 PM      Result Value Range   Glucose-Capillary 190 (*) 70 - 99 mg/dL   Comment 1 Notify RN    GLUCOSE, CAPILLARY     Status: Abnormal   Collection Time    12/26/13  4:49 PM      Result Value Range   Glucose-Capillary 153 (*) 70 - 99 mg/dL   Comment 1 Notify RN    GLUCOSE, CAPILLARY     Status: Abnormal   Collection Time    12/26/13  8:47 PM      Result Value Range   Glucose-Capillary 180 (*) 70 - 99 mg/dL   Comment 1 Notify RN     PROTIME-INR     Status: Abnormal   Collection Time    12/27/13  5:55 AM      Result Value Range   Prothrombin Time 31.3 (*) 11.6 - 15.2 seconds   INR 3.16 (*) 0.00 - 1.49  GLUCOSE, CAPILLARY     Status: Abnormal   Collection Time    12/27/13  7:20 AM      Result Value Range   Glucose-Capillary 130 (*) 70 - 99 mg/dL   Comment 1 Notify RN    BASIC METABOLIC PANEL     Status: Abnormal   Collection Time    12/27/13  9:20 AM      Result Value Range   Sodium 130 (*) 137 - 147 mEq/L   Potassium 5.1  3.7 - 5.3 mEq/L   Chloride 91 (*) 96 - 112 mEq/L   CO2 26  19 - 32 mEq/L   Glucose, Bld 182 (*) 70 - 99 mg/dL   BUN 42 (*) 6 - 23 mg/dL   Creatinine, Ser 1.47 (*) 0.50 - 1.35 mg/dL   Calcium 9.4  8.4 - 10.5 mg/dL   GFR calc non Af Amer 43 (*) >90 mL/min   GFR calc Af Amer 50 (*) >90 mL/min   Comment: (NOTE)  The eGFR has been calculated using the CKD EPI equation.     This calculation has not been validated in all clinical situations.     eGFR's persistently <90 mL/min signify possible Chronic Kidney     Disease.  GLUCOSE, CAPILLARY     Status: Abnormal   Collection Time    12/27/13 11:34 AM      Result Value Range   Glucose-Capillary 149 (*) 70 - 99 mg/dL   Comment 1 Notify RN    GLUCOSE, CAPILLARY     Status: None   Collection Time    12/27/13  5:05 PM      Result Value Range   Glucose-Capillary 91  70 - 99 mg/dL   Comment 1 Notify RN    GLUCOSE, CAPILLARY     Status: Abnormal   Collection Time    12/27/13  9:31 PM      Result Value Range   Glucose-Capillary 115 (*) 70 - 99 mg/dL  PROTIME-INR     Status: Abnormal   Collection Time    12/28/13  4:50 AM      Result Value Range   Prothrombin Time 27.3 (*) 11.6 - 15.2 seconds   INR 2.64 (*) 0.00 - 1.49  GLUCOSE, CAPILLARY     Status: Abnormal   Collection Time    12/28/13  7:41 AM      Result Value Range   Glucose-Capillary 124 (*) 70 - 99 mg/dL   Comment 1 Notify RN    GLUCOSE, CAPILLARY     Status: Abnormal    Collection Time    12/28/13 11:54 AM      Result Value Range   Glucose-Capillary 104 (*) 70 - 99 mg/dL   Comment 1 Notify RN    GLUCOSE, CAPILLARY     Status: None   Collection Time    12/28/13  4:47 PM      Result Value Range   Glucose-Capillary 81  70 - 99 mg/dL   Comment 1 Notify RN    GLUCOSE, CAPILLARY     Status: None   Collection Time    12/28/13 10:43 PM      Result Value Range   Glucose-Capillary 98  70 - 99 mg/dL  PROTIME-INR     Status: Abnormal   Collection Time    12/29/13  5:45 AM      Result Value Range   Prothrombin Time 22.7 (*) 11.6 - 15.2 seconds   INR 2.08 (*) 0.00 - 1.49     HEENT: normal, no evidence of epistaxis Cardio: irregular and no murmurs, rate controlled Resp: CTA B/L and unlabored GI: BS positive and non distended Extremity:  Pulses absent Bilat pedal and R pop and No Edema Skin:   Intact but dry Neuro: Alert/Oriented, Cranial Nerve Abnormalities L central 7, Normal Sensory, Abnormal Motor 3/5 L deltoid, bi, tri, grip,L quad, HF 2- ankle DF,  and Abnormal FMC Ataxic/ dec FMC Musc/Skel:  Other no pain with UE and LE ROM Gen NAD   Assessment/Plan: 1. Functional deficits secondary to Deconditioning respiratory and renal failure as well as chronic Left Hemiparesis from CVA 35yr ago which require 3+ hours per day of interdisciplinary therapy in a comprehensive inpatient rehab setting. Physiatrist is providing close team supervision and 24 hour management of active medical problems listed below. Physiatrist and rehab team continue to assess barriers to discharge/monitor patient progress toward functional and medical goals. FIM: FIM - Bathing Bathing Steps Patient Completed: Chest;Right Arm;Left Arm;Abdomen;Right upper leg;Left  upper leg;Front perineal area;Buttocks Bathing: 4: Min-Patient completes 8-9 18f10 parts or 75+ percent  FIM - Upper Body Dressing/Undressing Upper body dressing/undressing steps patient completed: Thread/unthread right  sleeve of pullover shirt/dresss;Thread/unthread left sleeve of pullover shirt/dress;Put head through opening of pull over shirt/dress;Pull shirt over trunk Upper body dressing/undressing: 5: Set-up assist to: Obtain clothing/put away FIM - Lower Body Dressing/Undressing Lower body dressing/undressing steps patient completed: Thread/unthread left underwear leg;Thread/unthread left pants leg;Pull pants up/down;Don/Doff right sock;Don/Doff left sock;Don/Doff right shoe;Fasten/unfasten right shoe Lower body dressing/undressing: 3: Mod-Patient completed 50-74% of tasks  FIM - Toileting Toileting steps completed by patient: Adjust clothing prior to toileting Toileting Assistive Devices: Grab bar or rail for support Toileting: 2: Max-Patient completed 1 of 3 steps  FIM - TRadio producerDevices: Grab bars;Elevated toilet seat Toilet Transfers: 3-To toilet/BSC: Mod A (lift or lower assist);3-From toilet/BSC: Mod A (lift or lower assist)  FIM - Bed/Chair Transfer Bed/Chair Transfer Assistive Devices: WAdult nurseTransfer: 4: Sit > Supine: Min A (steadying pt. > 75%/lift 1 leg);4: Bed > Chair or W/C: Min A (steadying Pt. > 75%);4: Chair or W/C > Bed: Min A (steadying Pt. > 75%)  FIM - Locomotion: Wheelchair Locomotion: Wheelchair: 1: Total Assistance/staff pushes wheelchair (Pt<25%) FIM - Locomotion: Ambulation Locomotion: Ambulation Assistive Devices: WAdministratorAmbulation/Gait Assistance: 3: Mod assist Locomotion: Ambulation: 1: Travels less than 50 ft with moderate assistance (Pt: 50 - 74%)  Comprehension Comprehension Mode: Auditory Comprehension: 5-Understands complex 90% of the time/Cues < 10% of the time  Expression Expression Mode: Verbal Expression: 6-Expresses complex ideas: With extra time/assistive device  Social Interaction Social Interaction: 6-Interacts appropriately with others with medication or extra time (anti-anxiety,  antidepressant).  Problem Solving Problem Solving: 5-Solves basic 90% of the time/requires cueing < 10% of the time  Memory Memory: 4-Recognizes or recalls 75 - 89% of the time/requires cueing 10 - 24% of the time  Medical Problem List and Plan:  Deconditioning related acute renal failure, hypoxia, and multiple medical issues  1. RLE DVT/A fib/Anticoagulation: Pharmaceutical: Coumadin per pharm protocol 2. Pain Management: prn tylenol    3. Mood: Will have LCSW follow for evaluation.  4. Neuropsych: This patient is capable of making decisions on his own behalf.  5. Epistaxis:resolved                                                                   6. Question multilobar PNA: Will check follow CXR. Antibiotic will do 14 day course. Has persistent cough  7. AECOPD: Respiratory status improving and slow taper of steroids. Continue nebs and IS.  8. Acute on chronic CHF: Compensated--continue low salt diet with 1500 cc fluid restriction.Lasix held per cardiology Check daily weights. Daily monitoring of I's and O's   fluid intake ~10031m24 hr 9. A fib: monitor HR with bid checks. Continue diltiazem 240 mg daily.  10. GU: Foley, s/p paraphimosis reduction per GU,UA -, plan voiding trial on 1/19 11. DM improving , glimipiride and linagliptin  LOS (Days) 9 A FACE TO FACE EVALUATION WAS PERFORMED  KIRSTEINS,ANDREW E 12/29/2013, 6:41 AM

## 2013-12-29 NOTE — Progress Notes (Signed)
Occupational Therapy Session Note  Patient Details  Name: Elie GoodyBobby Conant MRN: [id number] Date of Birth: 1931/04/05  Today's Date: 12/29/2013 Time: 7829-56210735-0830 and 1420-1450 Time Calculation (min): 55 min and 30 min  Short Term Goals: Week 2:  OT Short Term Goal 1 (Week 2): Patient will stand at sink for hygiene task with supervision OT Short Term Goal 2 (Week 2): Pt will wash LB with min assist using AE as needed OT Short Term Goal 3 (Week 2): Pt will dress LB with min assist using AE as needed OT Short Term Goal 4 (Week 2): Pt will complete toilet transfer with min assist using appropriate AD  Skilled Therapeutic Interventions/Progress Updates:    1) Pt seen for ADL retraining with focus on bed mobility, transfers, sit <> stand, and self-care retraining with bathing and dressing.  Pt in bed upon arrival, performed supine to sit with use of bed rails and min assist at trunk when scooting to EOB.  Stand pivot to w/c with use of RW with min/steady assist. Engaged in bathing at sit > stand level in walk-in shower.  Performed stand step transfer into shower with use of grab bars and min assist when sidestepping.  Pt with increased independence with sit <> stand this session requiring only min/steady assist (no lifting) throughout session.  LB dressing completed by crossing BLE over opposite knee with pt with increased dynamic sitting balance this session. Pt continues to require assist with pulling pants over hips while maintaining standing balance and assist with donning Lt shoe due to [name].  2) Pt seen for 1:1 OT with focus on functional transfers in ADL bathroom.  Pt's wife present and observed session.  Pt ambulated from bathroom doorway to tub/shower (3-5 feet) with RW and min assist.  Physical assist to lift LLE over tub ledge and mod verbal cues for sequencing of transfer.  Min/steady assist sit > stand with cues for hand placement. Pt ambulated to toilet (5-8 feet) with min assist with RW with cues  to not let RW get too far in front.  Pt attempted to have BM with no success, however noted blood in brief. Returned to room and notified RN of blood.  Therapy Documentation Precautions:  Precautions Precautions: Fall Precaution Comments: chronic left hemiplegia Required Braces or Orthoses: Other Brace/Splint Other Brace/Splint: patient's wife brought in [name] with metal uprights Restrictions Weight Bearing Restrictions: No General:   Vital Signs: Therapy Vitals Temp:  (temp. wouldn't register, oral or axillary) Pulse Rate: 84 Resp: 20 BP: 129/92 mmHg (RN notified) Patient Position, if appropriate: Standing Oxygen Therapy SpO2: 93 % (with activity) O2 Device: None (Room air) Pain: Pain Assessment Pain Assessment: No/denies pain  See FIM for current functional status  Therapy/Group: Individual Therapy  Rosalio LoudHOXIE, Durwood Dittus 12/29/2013, 9:49 AM

## 2013-12-29 NOTE — Progress Notes (Signed)
Patient ID: Hillel Card, male   DOB: [birth year], 80 y.o.   MRN: [id number]   SUBJECTIVE:  80 y/o male admitted with a/c respiratory failure in the setting of acute bronchitis and a/c diastolic HF.   Complains of fatigue. Denies SOB.   Creatinine 1.6>1.6 >1.4>1.4   . antiseptic oral rinse  15 mL Mouth Rinse q12n4p  . atorvastatin  10 mg Oral Daily  . bacitracin   Topical BID  . diltiazem  240 mg Oral Daily  . famotidine  20 mg Oral QHS  . furosemide  40 mg Oral BID  . glimepiride  1 mg Oral Q breakfast  . guaiFENesin  600 mg Oral BID  . hydrocerin   Topical BID  . insulin aspart  0-9 Units Subcutaneous TID WC  . insulin aspart  5 Units Subcutaneous TID WC  . linagliptin  5 mg Oral Daily  . multivitamin with minerals  1 tablet Oral Daily  . oxymetazoline  1 spray Each Nare BID  . sodium chloride  4 spray Each Nare 6 X Daily  . tamsulosin  0.4 mg Oral QPC supper  . Warfarin - Pharmacist Dosing Inpatient   Does not apply q1800    Filed Vitals:   12/28/13 1501 12/28/13 2028 12/29/13 0454 12/29/13 0500  BP: 111/54 98/60 118/68   Pulse:      Temp:   97.4 F (36.3 C)   TempSrc:      Resp: 19 18 18    Height:      Weight:    170 lb 3.1 oz (77.2 kg)  SpO2: 96% 93% 96%     Intake/Output Summary (Last 24 hours) at 12/29/13 0819 Last data filed at 12/29/13 0655  Gross per 24 hour  Intake   1440 ml  Output   1850 ml  Net   -410 ml    LABS: Basic Metabolic Panel:  Recent Labs  13/08/65 0920 12/29/13 0545  NA 130* 133*  K 5.1 4.7  CL 91* 93*  CO2 26 28  GLUCOSE 182* 58*  BUN 42* 33*  CREATININE 1.47* 1.45*  CALCIUM 9.4 9.3   Liver Function Tests: No results found for this basename: AST, ALT, ALKPHOS, BILITOT, PROT, ALBUMIN,  in the last 72 hours No results found for this basename: LIPASE, AMYLASE,  in the last 72 hours CBC: No results found for this basename: WBC, NEUTROABS, HGB, HCT, MCV, PLT,  in the last 72 hours Cardiac Enzymes: No results found for this  basename: CKTOTAL, CKMB, CKMBINDEX, TROPONINI,  in the last 72 hours   PHYSICAL EXAM General: NAD. Elderly. Sitting in wheelchair  Neck: JVP 5-6 no thyromegaly or thyroid nodule.  Lungs: Decreased in the bases CV: Nondisplaced PMI. Distant HS.Marland Kitchen Heart irregular S1/S2, no S3/S4, no murmur. NO edema   Abdomen: Soft, nontender, no hepatosplenomegaly, no distention.  Neurologic: Alert and oriented x 3. LUE weakness and swelling;  Psych: Normal affect. Extremities: No clubbing or cyanosis. LLE brace GU: Foley   ASSESSMENT AND PLAN: 80 yo with history of chronic atrial fibrillation, COPD, and diastolic CHF admitted with respiratory failure, suspect combination of acute/chronic diastolic CHF and acute exacerbation of COPD.  Continuing on rehab.   1. Pulmonary: Stable.   2. Acute on chronic diastolic CHF: Volume status stable. Would like to keep his weight 168-172 pounds. Continue lasix  40 mg daily.  Renal function ok.    3. Atrial fibrillation with RVR: Controlled, on diltiazem CD. On coumadin, INR 2.08 4. Acute on chronic  kidney injury: Creatinine down to 1.4 5. Epistaxis: Packing removed 1/8 by ENT.  He has acute RLE DVT in posterior tibial vein. INR  2.08  We will not see over the weekend. Call if needed.    CLEGG,AMY, NP-C  8:19 AM  Patient seen and examined with Tonye BecketAmy Clegg, NP. We discussed all aspects of the encounter. I agree with the assessment and plan as stated above.   Doing well. Volume status looks good. Back on lasix. Continue current regimen and rehab.  HF team will see again next week. Please call over the weekend with questions.  Janye Maynor,MD 9:55 AM

## 2013-12-30 ENCOUNTER — Inpatient Hospital Stay (HOSPITAL_COMMUNITY): Payer: Medicare Other | Admitting: Physical Therapy

## 2013-12-30 LAB — GLUCOSE, CAPILLARY
GLUCOSE-CAPILLARY: 98 mg/dL (ref 70–99)
Glucose-Capillary: 174 mg/dL — ABNORMAL HIGH (ref 70–99)
Glucose-Capillary: 137 mg/dL — ABNORMAL HIGH (ref 70–99)
Glucose-Capillary: 92 mg/dL (ref 70–99)

## 2013-12-30 LAB — PROTIME-INR
INR: 2.43 — ABNORMAL HIGH (ref 0.00–1.49)
Prothrombin Time: 25.6 seconds — ABNORMAL HIGH (ref 11.6–15.2)

## 2013-12-30 MED ORDER — WARFARIN SODIUM 5 MG PO TABS
5.0000 mg | ORAL_TABLET | Freq: Once | ORAL | Status: AC
  Administered 2013-12-30: 5 mg via ORAL
  Filled 2013-12-30: qty 1

## 2013-12-30 NOTE — Progress Notes (Signed)
ANTICOAGULATION CONSULT NOTE - Follow Up Consult  Pharmacy Consult for Coumadin Indication: atrial fibrillation  No Known Allergies  Patient Measurements: Height: 5\' 7"  (170.2 cm) Weight: 170 lb (77.111 kg) IBW/kg (Calculated) : 66.1  Vital Signs: Temp: 97.9 F (36.6 C) (01/17 0448) Temp src: Oral (01/17 0448) BP: 89/47 mmHg (01/17 0450) Pulse Rate: 88 (01/17 0450)  Labs:  Recent Labs  12/28/13 0450 12/29/13 0545 12/30/13 0519  LABPROT 27.3* 22.7* 25.6*  INR 2.64* 2.08* 2.43*  CREATININE  --  1.45*  --     Estimated Creatinine Clearance: 38 ml/min (by C-G formula based on Cr of 1.45).   Medications:  Scheduled:  . antiseptic oral rinse  15 mL Mouth Rinse q12n4p  . atorvastatin  10 mg Oral Daily  . bacitracin   Topical BID  . diltiazem  240 mg Oral Daily  . famotidine  20 mg Oral QHS  . furosemide  40 mg Oral BID  . glimepiride  1 mg Oral Q breakfast  . guaiFENesin  600 mg Oral BID  . hydrocerin   Topical BID  . insulin aspart  0-9 Units Subcutaneous TID WC  . linagliptin  5 mg Oral Daily  . multivitamin with minerals  1 tablet Oral Daily  . oxymetazoline  1 spray Each Nare BID  . sodium chloride  4 spray Each Nare 6 X Daily  . tamsulosin  0.4 mg Oral QPC supper  . Warfarin - Pharmacist Dosing Inpatient   Does not apply q1800    Assessment: 80yo male on Coumadin for AFib and acute RLE DVT. INR 2.43 therapeutic. Levaquin stopped. No bleeding noted.    Goal of Therapy:  INR 2-3 Monitor platelets by anticoagulation protocol: Yes   Plan:  Coumadin 5mg  po x 1. Continue daily INR  Talbert CageLora Ustin Cruickshank, PharmD Clinical Pharmacist  Pager: [phone]  12/30/2013 10:03 AM

## 2013-12-30 NOTE — Progress Notes (Signed)
Physical Therapy Session Note  Patient Details  Name: Elie GoodyBobby Scalia MRN: [id number] Date of Birth: [birth year]  Today's Date: 12/30/2013 Time: 1100-1200 Time Calculation (min): 60 min  Short Term Goals: Week 1:  PT Short Term Goal 1 (Week 1): Patient will perform supine <> sit with supervision PT Short Term Goal 1 - Progress (Week 1): Progressing toward goal PT Short Term Goal 2 (Week 1): Patient will perform sit to stand with supervision PT Short Term Goal 2 - Progress (Week 1): Progressing toward goal PT Short Term Goal 3 (Week 1): Patient will ambulate 50 feet with [name], RW, and min steady assist PT Short Term Goal 3 - Progress (Week 1): Progressing toward goal PT Short Term Goal 4 (Week 1): Patient will ambulate up and down 4 steps with 1 rail and mod assist PT Short Term Goal 4 - Progress (Week 1): Progressing toward goal  Therapy Documentation Precautions:  Precautions Precautions: Fall Precaution Comments: chronic left hemiplegia Required Braces or Orthoses: Other Brace/Splint Other Brace/Splint: patient's wife brought in [name] with metal uprights Restrictions Weight Bearing Restrictions: No Pain: Pain Assessment Pain Assessment: 0-10 Pain Score: 0-No pain Patients Stated Pain Goal: 3 Multiple Pain Sites: No   Therapeutic Exercise; (60') B LE's exercised on mat table including Active and AAROM. Patient performed the following exercises 1-2 sets of 10 reps, in supine: Short Arc Quads, Bent Knee "marching" in supine, supine LE abduction on slide-board.    Transfers from w/c to mat table via squat pivot transfer with Max-assist.  Transfer from mat table to w/c post exercise via squat pivot transfer required Max-assist.  Rehab Aide/Tech assisted throughout group exercise class.   Therapy/Group: Group Therapy  Jiana Lemaire J 12/30/2013, 12:56 PM

## 2013-12-30 NOTE — Plan of Care (Signed)
Problem: RH BLADDER ELIMINATION Goal: RH STG MANAGE BLADDER WITH EQUIPMENT WITH ASSISTANCE STG Manage Bladder With Equipment With mod Assistance  Outcome: Not Progressing Total assist for catheter management

## 2013-12-30 NOTE — Progress Notes (Signed)
Subjective/Complaints: 80 y.o. male with history of COPD--O2 at nights, h/o CVA with L-HP and memory deficits, chronic CHF, A fib with RVR as well as recent admission for ischemic RLE requiring R- femoral embolectomy due to embolic event. [name] on 12/10/13 with worsening of dyspnea, anasarca, acute renal failure and left foot pain. He was treated for acute on chronic renal failure with IV diuresis and started on antibiotics for AECOPD v/s viral bronchitis. Required BIPAP due to mucous plugging. Treated with nebs and steroids and respiratory status improving. He developed acute epistaxis on 01/02/124 treated with chemical cautery and nasal packing by Dr. Wilburn Cornelia with recommendations for continued antibiotic coverage. Follow up doppler with thrombus right posterior tibial vein and no DVT LLE  Slept ok No pain c/o, no SOB Discussed foley   Review of Systems - Negative except occ cough Objective: Vital Signs: Blood pressure 89/47, pulse 88, temperature 97.9 F (36.6 C), temperature source Oral, resp. rate 19, height 5' 7"  (1.702 m), weight 77.111 kg (170 lb), SpO2 98.00%. No results found. Results for orders placed during the hospital encounter of 12/20/13 (from the past 72 hour(s))  GLUCOSE, CAPILLARY     Status: None   Collection Time    12/27/13  5:05 PM      Result Value Range   Glucose-Capillary 91  70 - 99 mg/dL   Comment 1 Notify RN    GLUCOSE, CAPILLARY     Status: Abnormal   Collection Time    12/27/13  9:31 PM      Result Value Range   Glucose-Capillary 115 (*) 70 - 99 mg/dL  PROTIME-INR     Status: Abnormal   Collection Time    12/28/13  4:50 AM      Result Value Range   Prothrombin Time 27.3 (*) 11.6 - 15.2 seconds   INR 2.64 (*) 0.00 - 1.49  GLUCOSE, CAPILLARY     Status: Abnormal   Collection Time    12/28/13  7:41 AM      Result Value Range   Glucose-Capillary 124 (*) 70 - 99 mg/dL   Comment 1 Notify RN    GLUCOSE, CAPILLARY     Status: Abnormal    Collection Time    12/28/13 11:54 AM      Result Value Range   Glucose-Capillary 104 (*) 70 - 99 mg/dL   Comment 1 Notify RN    GLUCOSE, CAPILLARY     Status: None   Collection Time    12/28/13  4:47 PM      Result Value Range   Glucose-Capillary 81  70 - 99 mg/dL   Comment 1 Notify RN    GLUCOSE, CAPILLARY     Status: None   Collection Time    12/28/13 10:43 PM      Result Value Range   Glucose-Capillary 98  70 - 99 mg/dL  PROTIME-INR     Status: Abnormal   Collection Time    12/29/13  5:45 AM      Result Value Range   Prothrombin Time 22.7 (*) 11.6 - 15.2 seconds   INR 2.08 (*) 0.00 - 1.61  BASIC METABOLIC PANEL     Status: Abnormal   Collection Time    12/29/13  5:45 AM      Result Value Range   Sodium 133 (*) 137 - 147 mEq/L   Potassium 4.7  3.7 - 5.3 mEq/L   Chloride 93 (*) 96 - 112 mEq/L   CO2 28  19 - 32 mEq/L   Glucose, Bld 58 (*) 70 - 99 mg/dL   BUN 33 (*) 6 - 23 mg/dL   Creatinine, Ser 1.45 (*) 0.50 - 1.35 mg/dL   Calcium 9.3  8.4 - 10.5 mg/dL   GFR calc non Af Amer 44 (*) >90 mL/min   GFR calc Af Amer 51 (*) >90 mL/min   Comment: (NOTE)     The eGFR has been calculated using the CKD EPI equation.     This calculation has not been validated in all clinical situations.     eGFR's persistently <90 mL/min signify possible Chronic Kidney     Disease.  GLUCOSE, CAPILLARY     Status: None   Collection Time    12/29/13  7:43 AM      Result Value Range   Glucose-Capillary 70  70 - 99 mg/dL  GLUCOSE, CAPILLARY     Status: Abnormal   Collection Time    12/29/13 11:59 AM      Result Value Range   Glucose-Capillary 265 (*) 70 - 99 mg/dL  GLUCOSE, CAPILLARY     Status: Abnormal   Collection Time    12/29/13  4:43 PM      Result Value Range   Glucose-Capillary 108 (*) 70 - 99 mg/dL  GLUCOSE, CAPILLARY     Status: Abnormal   Collection Time    12/29/13 10:13 PM      Result Value Range   Glucose-Capillary 132 (*) 70 - 99 mg/dL  PROTIME-INR     Status: Abnormal    Collection Time    12/30/13  5:19 AM      Result Value Range   Prothrombin Time 25.6 (*) 11.6 - 15.2 seconds   INR 2.43 (*) 0.00 - 1.49  GLUCOSE, CAPILLARY     Status: None   Collection Time    12/30/13  8:01 AM      Result Value Range   Glucose-Capillary 98  70 - 99 mg/dL     HEENT: normal, no evidence of epistaxis Cardio: irregular and no murmurs, rate controlled Resp: CTA B/L and unlabored GI: BS positive and non distended Extremity:  Pulses absent Bilat pedal and R pop and No Edema Skin:   Intact but dry Neuro: Alert/Oriented, Cranial Nerve Abnormalities L central 7, Normal Sensory, Abnormal Motor 3/5 L deltoid, bi, tri, grip,L quad, HF 2- ankle DF,  and Abnormal FMC Ataxic/ dec FMC Musc/Skel:  Other no pain with UE and LE ROM Gen NAD   Assessment/Plan: 1. Functional deficits secondary to Deconditioning respiratory and renal failure as well as chronic Left Hemiparesis from CVA 106yr ago which require 3+ hours per day of interdisciplinary therapy in a comprehensive inpatient rehab setting. Physiatrist is providing close team supervision and 24 hour management of active medical problems listed below. Physiatrist and rehab team continue to assess barriers to discharge/monitor patient progress toward functional and medical goals. FIM: FIM - Bathing Bathing Steps Patient Completed: Chest;Right Arm;Left Arm;Abdomen;Right upper leg;Left upper leg;Front perineal area;Buttocks Bathing: 4: Min-Patient completes 8-9 059f0 parts or 75+ percent  FIM - Upper Body Dressing/Undressing Upper body dressing/undressing steps patient completed: Thread/unthread right sleeve of pullover shirt/dresss;Thread/unthread left sleeve of pullover shirt/dress;Put head through opening of pull over shirt/dress;Pull shirt over trunk Upper body dressing/undressing: 5: Set-up assist to: Obtain clothing/put away FIM - Lower Body Dressing/Undressing Lower body dressing/undressing steps patient completed:  Thread/unthread right underwear leg;Thread/unthread left underwear leg;Thread/unthread right pants leg;Thread/unthread left pants leg;Don/Doff right sock;Don/Doff left sock;Don/Doff  right shoe;Fasten/unfasten right shoe Lower body dressing/undressing: 3: Mod-Patient completed 50-74% of tasks  FIM - Toileting Toileting steps completed by patient: Adjust clothing prior to toileting Toileting Assistive Devices: Grab bar or rail for support Toileting: 2: Max-Patient completed 1 of 3 steps  FIM - Radio producer Devices: Elevated toilet seat;Walker Toilet Transfers: 3-To toilet/BSC: Mod A (lift or lower assist);4-From toilet/BSC: Min A (steadying Pt. > 75%)  FIM - Bed/Chair Transfer Bed/Chair Transfer Assistive Devices: Copy: 4: Chair or W/C > Bed: Min A (steadying Pt. > 75%);4: Bed > Chair or W/C: Min A (steadying Pt. > 75%)  FIM - Locomotion: Wheelchair Locomotion: Wheelchair: 1: Total Assistance/staff pushes wheelchair (Pt<25%) FIM - Locomotion: Ambulation Locomotion: Ambulation Assistive Devices: Administrator Ambulation/Gait Assistance: 3: Mod assist Locomotion: Ambulation: 0: Activity did not occur  Comprehension Comprehension Mode: Auditory Comprehension: 5-Understands basic 90% of the time/requires cueing < 10% of the time  Expression Expression Mode: Verbal Expression: 6-Expresses complex ideas: With extra time/assistive device  Social Interaction Social Interaction: 6-Interacts appropriately with others with medication or extra time (anti-anxiety, antidepressant).  Problem Solving Problem Solving: 5-Solves basic 90% of the time/requires cueing < 10% of the time  Memory Memory: 4-Recognizes or recalls 75 - 89% of the time/requires cueing 10 - 24% of the time  Medical Problem List and Plan:  Deconditioning related acute renal failure, hypoxia, and multiple medical issues  1. RLE DVT/A fib/Anticoagulation: Pharmaceutical:  Coumadin per pharm protocol 2. Pain Management: prn tylenol    3. Mood: Will have LCSW follow for evaluation.  4. Neuropsych: This patient is capable of making decisions on his own behalf.  5. Epistaxis:resolved                                                                   6. Question multilobar PNA: Will check follow CXR. Antibiotic will do 14 day course. Has persistent cough  7. AECOPD: Respiratory status improving and slow taper of steroids. Continue nebs and IS.  8. Acute on chronic CHF: Compensated--continue low salt diet with 1500 cc fluid restriction.Lasix held per cardiology Check daily weights. Daily monitoring of I's and O's   fluid intake ~109m/24 hr 9. A fib: monitor HR with bid checks. Continue diltiazem 240 mg daily.  10. GU: Foley, s/p paraphimosis reduction per GU,UA -, plan voiding trial on 1/19 11. DM improving , glimipiride and linagliptin  LOS (Days) 10 A FACE TO FACE EVALUATION WAS PERFORMED  Shalita Notte E 12/30/2013, 11:38 AM

## 2013-12-31 LAB — GLUCOSE, CAPILLARY
GLUCOSE-CAPILLARY: 82 mg/dL (ref 70–99)
GLUCOSE-CAPILLARY: 119 mg/dL — AB (ref 70–99)
Glucose-Capillary: 173 mg/dL — ABNORMAL HIGH (ref 70–99)
Glucose-Capillary: 100 mg/dL — ABNORMAL HIGH (ref 70–99)

## 2013-12-31 LAB — PROTIME-INR
INR: 2.54 — ABNORMAL HIGH (ref 0.00–1.49)
PROTHROMBIN TIME: 26.5 s — AB (ref 11.6–15.2)

## 2013-12-31 MED ORDER — FUROSEMIDE 40 MG PO TABS
40.0000 mg | ORAL_TABLET | Freq: Every day | ORAL | Status: DC
  Administered 2014-01-01 – 2014-01-03 (×3): 40 mg via ORAL
  Filled 2013-12-31 (×4): qty 1

## 2013-12-31 MED ORDER — WARFARIN SODIUM 5 MG PO TABS
5.0000 mg | ORAL_TABLET | Freq: Once | ORAL | Status: AC
  Administered 2013-12-31: 5 mg via ORAL
  Filled 2013-12-31: qty 1

## 2013-12-31 NOTE — Plan of Care (Signed)
Problem: RH BLADDER ELIMINATION Goal: RH STG MANAGE BLADDER WITH EQUIPMENT WITH ASSISTANCE STG Manage Bladder With Equipment With mod Assistance  Outcome: Not Progressing Total assist for indwelling catheter

## 2013-12-31 NOTE — Progress Notes (Signed)
ANTICOAGULATION CONSULT NOTE - Follow Up Consult  Pharmacy Consult for Coumadin Indication: atrial fibrillation  No Known Allergies  Patient Measurements: Height: 5\' 7"  (170.2 cm) Weight: 171 lb 4.8 oz (77.7 kg) IBW/kg (Calculated) : 66.1  Vital Signs: Temp: 98.2 F (36.8 C) (01/18 0504) Temp src: Oral (01/18 0504) BP: 79/45 mmHg (01/18 0510) Pulse Rate: 100 (01/18 0510)  Labs:  Recent Labs  12/29/13 0545 12/30/13 0519 12/31/13 0621  LABPROT 22.7* 25.6* 26.5*  INR 2.08* 2.43* 2.54*  CREATININE 1.45*  --   --     Estimated Creatinine Clearance: 38 ml/min (by C-G formula based on Cr of 1.45).   Medications:  Scheduled:  . antiseptic oral rinse  15 mL Mouth Rinse q12n4p  . atorvastatin  10 mg Oral Daily  . bacitracin   Topical BID  . diltiazem  240 mg Oral Daily  . famotidine  20 mg Oral QHS  . furosemide  40 mg Oral BID  . glimepiride  1 mg Oral Q breakfast  . guaiFENesin  600 mg Oral BID  . hydrocerin   Topical BID  . insulin aspart  0-9 Units Subcutaneous TID WC  . linagliptin  5 mg Oral Daily  . multivitamin with minerals  1 tablet Oral Daily  . oxymetazoline  1 spray Each Nare BID  . sodium chloride  4 spray Each Nare 6 X Daily  . tamsulosin  0.4 mg Oral QPC supper  . Warfarin - Pharmacist Dosing Inpatient   Does not apply q1800    Assessment: 80yo male on Coumadin for AFib and acute RLE DVT. INR 2.54 therapeutic. Levaquin stopped. No bleeding noted.    Goal of Therapy:  INR 2-3 Monitor platelets by anticoagulation protocol: Yes   Plan:  Coumadin 5mg  po x 1. Continue daily INR  Talbert CageLora Geraldin Habermehl, PharmD Clinical Pharmacist  Pager: [phone]  12/31/2013 8:22 AM

## 2013-12-31 NOTE — Progress Notes (Signed)
Subjective/Complaints: 80 y.o. male with history of COPD--O2 at nights, h/o CVA with L-HP and memory deficits, chronic CHF, A fib with RVR as well as recent admission for ischemic RLE requiring R- femoral embolectomy due to embolic event. [name] on 12/10/13 with worsening of dyspnea, anasarca, acute renal failure and left foot pain. He was treated for acute on chronic renal failure with IV diuresis and started on antibiotics for AECOPD v/s viral bronchitis. Required BIPAP due to mucous plugging. Treated with nebs and steroids and respiratory status improving. He developed acute epistaxis on 01/02/124 treated with chemical cautery and nasal packing by Dr. Wilburn Cornelia with recommendations for continued antibiotic coverage. Follow up doppler with thrombus right posterior tibial vein and no DVT LLE  orthostasis noted by nsg this am, has foley [name] Review of Systems - Negative except occ cough Objective: Vital Signs: Blood pressure 79/45, pulse 100, temperature 98.2 F (36.8 C), temperature source Oral, resp. rate 18, height _0  (1.702 m), weight 77.7 kg (171 lb 4.8 oz), SpO2 95.00%. No results found. Results for orders placed during the hospital encounter of 12/20/13 (from the past 72 hour(s))  GLUCOSE, CAPILLARY     Status: Abnormal   Collection Time    12/28/13 11:54 AM      Result Value Range   Glucose-Capillary 104 (*) 70 - 99 mg/dL   Comment 1 Notify RN    GLUCOSE, CAPILLARY     Status: None   Collection Time    12/28/13  4:47 PM      Result Value Range   Glucose-Capillary 81  70 - 99 mg/dL   Comment 1 Notify RN    GLUCOSE, CAPILLARY     Status: None   Collection Time    12/28/13 10:43 PM      Result Value Range   Glucose-Capillary 98  70 - 99 mg/dL  PROTIME-INR     Status: Abnormal   Collection Time    12/29/13  5:45 AM      Result Value Range   Prothrombin Time 22.7 (*) 11.6 - 15.2 seconds   INR 2.08 (*) 0.00 - 3.66  BASIC METABOLIC PANEL      Status: Abnormal   Collection Time    12/29/13  5:45 AM      Result Value Range   Sodium 133 (*) 137 - 147 mEq/L   Potassium 4.7  3.7 - 5.3 mEq/L   Chloride 93 (*) 96 - 112 mEq/L   CO2 28  19 - 32 mEq/L   Glucose, Bld 58 (*) 70 - 99 mg/dL   BUN 33 (*) 6 - 23 mg/dL   Creatinine, Ser 1.45 (*) 0.50 - 1.35 mg/dL   Calcium 9.3  8.4 - 10.5 mg/dL   GFR calc non Af Amer 44 (*) >90 mL/min   GFR calc Af Amer 51 (*) >90 mL/min   Comment: (NOTE)     The eGFR has been calculated using the CKD EPI equation.     This calculation has not been validated in all clinical situations.     eGFR's persistently <90 mL/min signify possible Chronic Kidney     Disease.  GLUCOSE, CAPILLARY     Status: None   Collection Time    12/29/13  7:43 AM      Result Value Range   Glucose-Capillary 70  70 - 99 mg/dL  GLUCOSE, CAPILLARY     Status: Abnormal   Collection Time    12/29/13 11:59 AM  Result Value Range   Glucose-Capillary 265 (*) 70 - 99 mg/dL  GLUCOSE, CAPILLARY     Status: Abnormal   Collection Time    12/29/13  4:43 PM      Result Value Range   Glucose-Capillary 108 (*) 70 - 99 mg/dL  GLUCOSE, CAPILLARY     Status: Abnormal   Collection Time    12/29/13 10:13 PM      Result Value Range   Glucose-Capillary 132 (*) 70 - 99 mg/dL  PROTIME-INR     Status: Abnormal   Collection Time    12/30/13  5:19 AM      Result Value Range   Prothrombin Time 25.6 (*) 11.6 - 15.2 seconds   INR 2.43 (*) 0.00 - 1.49  GLUCOSE, CAPILLARY     Status: None   Collection Time    12/30/13  8:01 AM      Result Value Range   Glucose-Capillary 98  70 - 99 mg/dL  GLUCOSE, CAPILLARY     Status: Abnormal   Collection Time    12/30/13 12:06 PM      Result Value Range   Glucose-Capillary 174 (*) 70 - 99 mg/dL  GLUCOSE, CAPILLARY     Status: Abnormal   Collection Time    12/30/13  4:35 PM      Result Value Range   Glucose-Capillary 137 (*) 70 - 99 mg/dL  GLUCOSE, CAPILLARY     Status: None   Collection Time     12/30/13  9:20 PM      Result Value Range   Glucose-Capillary 92  70 - 99 mg/dL   Comment 1 Notify RN    PROTIME-INR     Status: Abnormal   Collection Time    12/31/13  6:21 AM      Result Value Range   Prothrombin Time 26.5 (*) 11.6 - 15.2 seconds   INR 2.54 (*) 0.00 - 1.49  GLUCOSE, CAPILLARY     Status: None   Collection Time    12/31/13  7:03 AM      Result Value Range   Glucose-Capillary 82  70 - 99 mg/dL   Comment 1 Notify RN    GLUCOSE, CAPILLARY     Status: Abnormal   Collection Time    12/31/13 11:26 AM      Result Value Range   Glucose-Capillary 173 (*) 70 - 99 mg/dL     HEENT: normal, no evidence of epistaxis Cardio: irregular and no murmurs, rate controlled Resp: CTA B/L and unlabored GI: BS positive and non distended Extremity:  Pulses absent Bilat pedal and R pop and No Edema Skin:   Intact but dry Neuro: Alert/Oriented, Cranial Nerve Abnormalities L central 7, Normal Sensory, Abnormal Motor 3/5 L deltoid, bi, tri, grip,L quad, HF 2- ankle DF,  and Abnormal FMC Ataxic/ dec FMC Musc/Skel:  Other no pain with UE and LE ROM Gen NAD   Assessment/Plan: 1. Functional deficits secondary to Deconditioning respiratory and renal failure as well as chronic Left Hemiparesis from CVA 61yr ago which require 3+ hours per day of interdisciplinary therapy in a comprehensive inpatient rehab setting. Physiatrist is providing close team supervision and 24 hour management of active medical problems listed below. Physiatrist and rehab team continue to assess barriers to discharge/monitor patient progress toward functional and medical goals. FIM: FIM - Bathing Bathing Steps Patient Completed: Chest;Right Arm;Left Arm;Abdomen;Right upper leg;Left upper leg;Front perineal area;Buttocks Bathing: 4: Min-Patient completes 8-9 011f0 parts or 75+ percent  FIM - Upper Body Dressing/Undressing Upper body dressing/undressing steps patient completed: Thread/unthread right sleeve of pullover  shirt/dresss;Thread/unthread left sleeve of pullover shirt/dress;Put head through opening of pull over shirt/dress;Pull shirt over trunk Upper body dressing/undressing: 5: Set-up assist to: Obtain clothing/put away FIM - Lower Body Dressing/Undressing Lower body dressing/undressing steps patient completed: Thread/unthread right underwear leg;Thread/unthread left underwear leg;Thread/unthread right pants leg;Thread/unthread left pants leg;Don/Doff right sock;Don/Doff left sock;Don/Doff right shoe;Fasten/unfasten right shoe Lower body dressing/undressing: 3: Mod-Patient completed 50-74% of tasks  FIM - Toileting Toileting steps completed by patient: Adjust clothing prior to toileting Toileting Assistive Devices: Grab bar or rail for support Toileting: 2: Max-Patient completed 1 of 3 steps  FIM - Radio producer Devices: Elevated toilet seat;Walker Toilet Transfers: 3-To toilet/BSC: Mod A (lift or lower assist);4-From toilet/BSC: Min A (steadying Pt. > 75%)  FIM - Bed/Chair Transfer Bed/Chair Transfer Assistive Devices: Copy: 4: Chair or W/C > Bed: Min A (steadying Pt. > 75%);4: Bed > Chair or W/C: Min A (steadying Pt. > 75%)  FIM - Locomotion: Wheelchair Locomotion: Wheelchair: 1: Total Assistance/staff pushes wheelchair (Pt<25%) FIM - Locomotion: Ambulation Locomotion: Ambulation Assistive Devices: Administrator Ambulation/Gait Assistance: 3: Mod assist Locomotion: Ambulation: 0: Activity did not occur  Comprehension Comprehension Mode: Auditory Comprehension: 5-Understands basic 90% of the time/requires cueing < 10% of the time  Expression Expression Mode: Verbal Expression: 6-Expresses complex ideas: With extra time/assistive device  Social Interaction Social Interaction: 6-Interacts appropriately with others with medication or extra time (anti-anxiety, antidepressant).  Problem Solving Problem Solving: 5-Solves basic 90% of  the time/requires cueing < 10% of the time  Memory Memory: 4-Recognizes or recalls 75 - 89% of the time/requires cueing 10 - 24% of the time  Medical Problem List and Plan:  Deconditioning related acute renal failure, hypoxia, and multiple medical issues  1. RLE DVT/A fib/Anticoagulation: Pharmaceutical: Coumadin per pharm protocol 2. Pain Management: prn tylenol    3. Mood: Will have LCSW follow for evaluation.  4. Neuropsych: This patient is capable of making decisions on his own behalf.  5. Epistaxis:resolved                                                                   6. Question multilobar PNA: Will check follow CXR. Antibiotic will do 14 day course. Has persistent cough  7. AECOPD: Respiratory status improving and slow taper of steroids. Continue nebs and IS.  8. Acute on chronic CHF: Compensated--continue low salt diet with 1500 cc fluid restriction.Lasix held per cardiology Check daily weights stable at 77kg. Daily monitoring of I's and O's    9. A fib: monitor HR with bid checks. Continue diltiazem 240 mg daily.  10. GU: Foley, s/p paraphimosis reduction per GU,UA -, plan voiding trial on 1/19 11. DM improving , glimipiride and linagliptin 12.  Orhtostaic hypotension, multifactorial, meds, mild dehydration, will stop flomax since pt has foley LOS (Days) 11 A FACE TO FACE EVALUATION WAS PERFORMED  [name] [name] E 12/31/2013, 11:44 AM

## 2013-12-31 NOTE — Progress Notes (Signed)
   Chart reviewed. Orthostasis noted.  Will decrease lasix to once a day. Weight is stable.   Daniel Bensimhon,MD 11:53 AM

## 2014-01-01 ENCOUNTER — Inpatient Hospital Stay (HOSPITAL_COMMUNITY): Payer: Medicare Other | Admitting: Occupational Therapy

## 2014-01-01 ENCOUNTER — Inpatient Hospital Stay (HOSPITAL_COMMUNITY): Payer: Medicare Other | Admitting: Physical Therapy

## 2014-01-01 ENCOUNTER — Inpatient Hospital Stay (HOSPITAL_COMMUNITY): Payer: Medicare HMO | Admitting: Occupational Therapy

## 2014-01-01 DIAGNOSIS — R5381 Other malaise: Secondary | ICD-10-CM

## 2014-01-01 DIAGNOSIS — G8110 Spastic hemiplegia affecting unspecified side: Secondary | ICD-10-CM

## 2014-01-01 LAB — BASIC METABOLIC PANEL
BUN: 28 mg/dL — ABNORMAL HIGH (ref 6–23)
CALCIUM: 9.1 mg/dL (ref 8.4–10.5)
CO2: 27 mEq/L (ref 19–32)
CREATININE: 1.23 mg/dL (ref 0.50–1.35)
Chloride: 96 mEq/L (ref 96–112)
GFR calc non Af Amer: 54 mL/min — ABNORMAL LOW (ref >90)
GFR, EST AFRICAN AMERICAN: 62 mL/min — AB (ref >90)
Glucose, Bld: 62 mg/dL — ABNORMAL LOW (ref 70–99)
Potassium: 4.2 mEq/L (ref 3.7–5.3)
Sodium: 134 mEq/L — ABNORMAL LOW (ref 137–147)

## 2014-01-01 LAB — GLUCOSE, CAPILLARY
GLUCOSE-CAPILLARY: 159 mg/dL — AB (ref 70–99)
Glucose-Capillary: 87 mg/dL (ref 70–99)
Glucose-Capillary: 120 mg/dL — ABNORMAL HIGH (ref 70–99)
Glucose-Capillary: 104 mg/dL — ABNORMAL HIGH (ref 70–99)

## 2014-01-01 LAB — PROTIME-INR
INR: 2.71 — AB (ref 0.00–1.49)
Prothrombin Time: 27.8 seconds — ABNORMAL HIGH (ref 11.6–15.2)

## 2014-01-01 MED ORDER — WARFARIN SODIUM 4 MG PO TABS
4.0000 mg | ORAL_TABLET | Freq: Every day | ORAL | Status: DC
  Administered 2014-01-01: 4 mg via ORAL
  Filled 2014-01-01 (×2): qty 1

## 2014-01-01 NOTE — Plan of Care (Signed)
Problem: RH BLADDER ELIMINATION Goal: RH STG MANAGE BLADDER WITH EQUIPMENT WITH ASSISTANCE STG Manage Bladder With Equipment With mod Assistance  Outcome: Not Progressing Total assist for in and out cath, no spontaneous voids for 7 am - 7 pm shift

## 2014-01-01 NOTE — Progress Notes (Signed)
Occupational Therapy Session Note  Patient Details  Name: [name] [name] MRN: [id number] Date of Birth: 22-May-1931  Today's Date: 01/01/2014 Time: [id number] Time Calculation (min): 43 min   Skilled Therapeutic Interventions/Progress Updates:    Pt worked on toilet, tub shower, and bed transfers during session.  Pt's wife present for session and was educated on assisting pt with all transfers using the RW and gait belt in place.  Pt needs mod instructional cueing for hand placement with sit to stand during most transfers as well as making sure he backs all the way up and squares himself to the surface he's transferring to.  Decreased ability to advance the LLE with all transfers.  Wife assist with transferring pt out of the tub shower and to and from the toilet.  Encouraged pt's wife to purchase gait belt for safety as well. Therapy Documentation Precautions:  Precautions Precautions: Fall Precaution Comments: chronic left hemiplegia Required Braces or Orthoses: Other Brace/Splint Other Brace/Splint: patient's wife brought in [name] with metal uprights Restrictions Weight Bearing Restrictions: No  Pain: Pain Assessment Pain Assessment: No/denies pain ADL: See FIM for current functional status  Therapy/Group: Individual Therapy  Quinita Kostelecky OTR/L 01/01/2014, 4:23 PM

## 2014-01-01 NOTE — Progress Notes (Signed)
Social Work Patient ID: Elie GoodyBobby Petrakis, male   DOB: [birth year], 80 y.o.   MRN: [id number] Wife to be here tomorrow at 8;30am for OT training.  Pt reports PT and OT this afternoon went well.  Will discuss with wife tomorrow.

## 2014-01-01 NOTE — Progress Notes (Signed)
Social Work Patient ID: [name] [name], male   DOB: January 12, 1931, 80 y.o.   MRN: [id number] Met with pt and daughter in-law to discuss family education, wife to stay for afternoon therapies to begin and come in tomorrow also. Main goal is for wife and pt to be comfortable with his care.  He is having a better day today, was more assist over the weekend, RN felt medication issues. Wife can do min assist if pt is more than this she will not be able to handle him.  Pt also concerned he is not too much care for his wife.

## 2014-01-01 NOTE — Plan of Care (Signed)
Problem: RH BOWEL ELIMINATION Goal: RH STG MANAGE BOWEL WITH ASSISTANCE STG Manage Bowel with Assistance.maxx assist  Outcome: Progressing Pt is incontinent @ times in brief.  Problem: RH BLADDER ELIMINATION Goal: RH STG MANAGE BLADDER WITH ASSISTANCE STG Manage Bladder With Assistance maxx assist  Coude cath in place

## 2014-01-01 NOTE — Progress Notes (Signed)
ANTICOAGULATION CONSULT NOTE - Follow Up Consult  Pharmacy Consult for Coumadin Indication: atrial fibrillation  No Known Allergies  Patient Measurements: Height: 5\' 7"  (170.2 cm) Weight: 168 lb 3.4 oz (76.3 kg) IBW/kg (Calculated) : 66.1  Vital Signs: Temp: 97.3 F (36.3 C) (01/19 0632) Temp src: Oral (01/19 0632) BP: 98/61 mmHg (01/19 0936) Pulse Rate: 99 (01/19 0936)  Labs:  Recent Labs  12/30/13 0519 12/31/13 0621 01/01/14 0339  LABPROT 25.6* 26.5* 27.8*  INR 2.43* 2.54* 2.71*  CREATININE  --   --  1.23    Estimated Creatinine Clearance: 44.8 ml/min (by C-G formula based on Cr of 1.23).   Medications:  Scheduled:  . antiseptic oral rinse  15 mL Mouth Rinse q12n4p  . atorvastatin  10 mg Oral Daily  . bacitracin   Topical BID  . diltiazem  240 mg Oral Daily  . famotidine  20 mg Oral QHS  . furosemide  40 mg Oral Daily  . glimepiride  1 mg Oral Q breakfast  . guaiFENesin  600 mg Oral BID  . hydrocerin   Topical BID  . insulin aspart  0-9 Units Subcutaneous TID WC  . linagliptin  5 mg Oral Daily  . multivitamin with minerals  1 tablet Oral Daily  . oxymetazoline  1 spray Each Nare BID  . sodium chloride  4 spray Each Nare 6 X Daily  . Warfarin - Pharmacist Dosing Inpatient   Does not apply q1800    Assessment: 80yo male on Coumadin for AFib and acute RLE DVT. INR 2.71 therapeutic. No further epistaxis.   Goal of Therapy:  INR 2-3 Monitor platelets by anticoagulation protocol: Yes   Plan:  Try coumadin 4 daily Continue daily INR  Bayard HuggerMei Syvanna Ciolino, PharmD, BCPS  Clinical Pharmacist  Pager: [phone]  01/01/2014 12:43 PM

## 2014-01-01 NOTE — Progress Notes (Signed)
Subjective/Complaints: 80 y.o. male with history of COPD--O2 at nights, h/o CVA with L-HP and memory deficits, chronic CHF, A fib with RVR as well as recent admission for ischemic RLE requiring R- femoral embolectomy due to embolic event. [name] on 12/10/13 with worsening of dyspnea, anasarca, acute renal failure and left foot pain. He was treated for acute on chronic renal failure with IV diuresis and started on antibiotics for AECOPD v/s viral bronchitis. Required BIPAP due to mucous plugging. Treated with nebs and steroids and respiratory status improving. He developed acute epistaxis on 01/02/124 treated with chemical cautery and nasal packing by Dr. Wilburn Cornelia with recommendations for continued antibiotic coverage. Follow up doppler with thrombus right posterior tibial vein and no DVT LLE Discussed with cardiology today [name] Review of Systems - Negative except occ cough Objective: Vital Signs: Blood pressure 91/49, pulse 90, temperature 97.3 F (36.3 C), temperature source Oral, resp. rate 20, height 5' 7"  (1.702 m), weight 76.3 kg (168 lb 3.4 oz), SpO2 96.00%. No results found. Results for orders placed during the hospital encounter of 12/20/13 (from the past 72 hour(s))  GLUCOSE, CAPILLARY     Status: Abnormal   Collection Time    12/29/13 11:59 AM      Result Value Range   Glucose-Capillary 265 (*) 70 - 99 mg/dL  GLUCOSE, CAPILLARY     Status: Abnormal   Collection Time    12/29/13  4:43 PM      Result Value Range   Glucose-Capillary 108 (*) 70 - 99 mg/dL  GLUCOSE, CAPILLARY     Status: Abnormal   Collection Time    12/29/13 10:13 PM      Result Value Range   Glucose-Capillary 132 (*) 70 - 99 mg/dL  PROTIME-INR     Status: Abnormal   Collection Time    12/30/13  5:19 AM      Result Value Range   Prothrombin Time 25.6 (*) 11.6 - 15.2 seconds   INR 2.43 (*) 0.00 - 1.49  GLUCOSE, CAPILLARY     Status: None   Collection Time    12/30/13  8:01 AM       Result Value Range   Glucose-Capillary 98  70 - 99 mg/dL  GLUCOSE, CAPILLARY     Status: Abnormal   Collection Time    12/30/13 12:06 PM      Result Value Range   Glucose-Capillary 174 (*) 70 - 99 mg/dL  GLUCOSE, CAPILLARY     Status: Abnormal   Collection Time    12/30/13  4:35 PM      Result Value Range   Glucose-Capillary 137 (*) 70 - 99 mg/dL  GLUCOSE, CAPILLARY     Status: None   Collection Time    12/30/13  9:20 PM      Result Value Range   Glucose-Capillary 92  70 - 99 mg/dL   Comment 1 Notify RN    PROTIME-INR     Status: Abnormal   Collection Time    12/31/13  6:21 AM      Result Value Range   Prothrombin Time 26.5 (*) 11.6 - 15.2 seconds   INR 2.54 (*) 0.00 - 1.49  GLUCOSE, CAPILLARY     Status: None   Collection Time    12/31/13  7:03 AM      Result Value Range   Glucose-Capillary 82  70 - 99 mg/dL   Comment 1 Notify RN    GLUCOSE, CAPILLARY  Status: Abnormal   Collection Time    12/31/13 11:26 AM      Result Value Range   Glucose-Capillary 173 (*) 70 - 99 mg/dL  GLUCOSE, CAPILLARY     Status: Abnormal   Collection Time    12/31/13  4:59 PM      Result Value Range   Glucose-Capillary 119 (*) 70 - 99 mg/dL  GLUCOSE, CAPILLARY     Status: Abnormal   Collection Time    12/31/13  9:10 PM      Result Value Range   Glucose-Capillary 100 (*) 70 - 99 mg/dL   Comment 1 Notify RN    PROTIME-INR     Status: Abnormal   Collection Time    01/01/14  3:39 AM      Result Value Range   Prothrombin Time 27.8 (*) 11.6 - 15.2 seconds   INR 2.71 (*) 0.00 - 4.08  BASIC METABOLIC PANEL     Status: Abnormal   Collection Time    01/01/14  3:39 AM      Result Value Range   Sodium 134 (*) 137 - 147 mEq/L   Potassium 4.2  3.7 - 5.3 mEq/L   Chloride 96  96 - 112 mEq/L   CO2 27  19 - 32 mEq/L   Glucose, Bld 62 (*) 70 - 99 mg/dL   BUN 28 (*) 6 - 23 mg/dL   Creatinine, Ser 1.23  0.50 - 1.35 mg/dL   Calcium 9.1  8.4 - 10.5 mg/dL   GFR calc non Af Amer 54 (*) >90  mL/min   GFR calc Af Amer 62 (*) >90 mL/min   Comment: (NOTE)     The eGFR has been calculated using the CKD EPI equation.     This calculation has not been validated in all clinical situations.     eGFR's persistently <90 mL/min signify possible Chronic Kidney     Disease.  GLUCOSE, CAPILLARY     Status: None   Collection Time    01/01/14  7:37 AM      Result Value Range   Glucose-Capillary 87  70 - 99 mg/dL   Comment 1 Notify RN       HEENT: normal, no evidence of epistaxis Cardio: irregular and no murmurs, rate controlled Resp: CTA B/L and unlabored GI: BS positive and non distended Extremity:  Pulses absent Bilat pedal and R pop and No Edema Skin:   Intact but dry Neuro: Alert/Oriented, Cranial Nerve Abnormalities L central 7, Normal Sensory, Abnormal Motor 3/5 L deltoid, bi, tri, grip,L quad, HF 2- ankle DF,  and Abnormal FMC Ataxic/ dec FMC Musc/Skel:  Other no pain with UE and LE ROM Gen NAD   Assessment/Plan: 1. Functional deficits secondary to Deconditioning respiratory and renal failure as well as chronic Left Hemiparesis from CVA 28yr ago which require 3+ hours per day of interdisciplinary therapy in a comprehensive inpatient rehab setting. Physiatrist is providing close team supervision and 24 hour management of active medical problems listed below. Physiatrist and rehab team continue to assess barriers to discharge/monitor patient progress toward functional and medical goals. FIM: FIM - Bathing Bathing Steps Patient Completed: Chest;Right Arm;Left Arm;Abdomen;Right upper leg;Left upper leg;Front perineal area;Buttocks Bathing: 4: Min-Patient completes 8-9 [id number] parts or 75+ percent  FIM - Upper Body Dressing/Undressing Upper body dressing/undressing steps patient completed: Thread/unthread right sleeve of pullover shirt/dresss;Thread/unthread left sleeve of pullover shirt/dress;Put head through opening of pull over shirt/dress;Pull shirt over trunk Upper body  dressing/undressing: 5:  Set-up assist to: Obtain clothing/put away FIM - Lower Body Dressing/Undressing Lower body dressing/undressing steps patient completed: Thread/unthread left underwear leg;Thread/unthread left pants leg;Don/Doff right sock;Don/Doff left sock;Don/Doff right shoe;Fasten/unfasten right shoe;Fasten/unfasten left shoe Lower body dressing/undressing: 3: Mod-Patient completed 50-74% of tasks  FIM - Toileting Toileting steps completed by patient: Adjust clothing prior to toileting Toileting Assistive Devices: Grab bar or rail for support Toileting: 2: Max-Patient completed 1 of 3 steps  FIM - Radio producer Devices: Elevated toilet seat;Walker Toilet Transfers: 3-To toilet/BSC: Mod A (lift or lower assist);4-From toilet/BSC: Min A (steadying Pt. > 75%)  FIM - Bed/Chair Transfer Bed/Chair Transfer Assistive Devices: Copy: 5: Supine > Sit: Supervision (verbal cues/safety issues);3: Bed > Chair or W/C: Mod A (lift or lower assist)  FIM - Locomotion: Wheelchair Locomotion: Wheelchair: 1: Total Assistance/staff pushes wheelchair (Pt<25%) FIM - Locomotion: Ambulation Locomotion: Ambulation Assistive Devices: Administrator Ambulation/Gait Assistance: 3: Mod assist Locomotion: Ambulation: 0: Activity did not occur  Comprehension Comprehension Mode: Auditory Comprehension: 5-Understands basic 90% of the time/requires cueing < 10% of the time  Expression Expression Mode: Verbal Expression: 5-Expresses basic needs/ideas: With extra time/assistive device  Social Interaction Social Interaction: 6-Interacts appropriately with others with medication or extra time (anti-anxiety, antidepressant).  Problem Solving Problem Solving: 5-Solves basic 90% of the time/requires cueing < 10% of the time  Memory Memory: 4-Recognizes or recalls 75 - 89% of the time/requires cueing 10 - 24% of the time  Medical Problem List and Plan:   Deconditioning related acute renal failure, hypoxia, and multiple medical issues  1. RLE DVT/A fib/Anticoagulation: Pharmaceutical: Coumadin per pharm protocol 2. Pain Management: prn tylenol    3. Mood: Will have LCSW follow for evaluation.  4. Neuropsych: This patient is capable of making decisions on his own behalf.  5. Epistaxis:resolved                                                                   6. Question multilobar PNA: Will check follow CXR. Antibiotic will do 14 day course. Has persistent cough  7. AECOPD: Respiratory status improving and slow taper of steroids. Continue nebs and IS.  8. Acute on chronic CHF: Compensated--continue low salt diet with 1500 cc fluid restriction.Lasix held per cardiology daily weights 1kg down at 76kg. Daily monitoring of I's and O's    9. A fib: monitor HR with bid checks. Continue diltiazem 240 mg daily.  10. GU: Foley, s/p paraphimosis reduction per GU,UA -,  voiding trial on 1/19 11. DM improving , glimipiride and linagliptin 12.  Orthostaic hypotension, multifactorial, meds, mild dehydration, off flomax LOS (Days) 12 A FACE TO FACE EVALUATION WAS PERFORMED  [name] [name] 01/01/2014, 8:54 AM

## 2014-01-01 NOTE — Progress Notes (Signed)
Patient with orthostatic hypotension. Lying 108/56, pulse 70, sitting 94/49,pulse 65 standing 91/49 , pulse 90. Discussed medication with Dr. Wynn BankerKirsteins, instructed to hold today's dose of Cardizem 240 mg, dose held. Roberts-VonCannon, Teonia Yager Elon JesterMichele

## 2014-01-01 NOTE — Progress Notes (Signed)
Occupational Therapy Session Note  Patient Details  Name: [name] [name] MRN: [id number] Date of Birth: [birth year]  Today's Date: 01/01/2014 Time: [id number] Time Calculation (min): 55 min  Short Term Goals: Week 2:  OT Short Term Goal 1 (Week 2): Patient will stand at sink for hygiene task with supervision OT Short Term Goal 2 (Week 2): Pt will wash LB with min assist using AE as needed OT Short Term Goal 3 (Week 2): Pt will dress LB with min assist using AE as needed OT Short Term Goal 4 (Week 2): Pt will complete toilet transfer with min assist using appropriate AD  Skilled Therapeutic Interventions/Progress Updates:    Pt seen for ADL retraining with focus on bed mobility, transfers, and increased independence with self-care tasks of bathing and dressing (mostly LB bathing and dressing).  Pt with difficulty crossing RLE over knee this session requiring physical assist to lift RLE to knee and then to stabilize it there while donning socks and attempting to wash feet.  Pt unable to thread RLE through underwear or pants this session, plan to focus more on adaptive techniques to improve task.  Mod assist sit > stand from bed initially, however progressed to min/steady assist as session progressed.  Utilized RW with stand pivot transfer to w/c with min/steady assist.  Pt with increased recall of proper hand placement with sit > stand with increased time and occasional 1 question cue.  Pt continues to require multiple rest breaks during self-care tasks due to decreased activity tolerance.  Therapy Documentation Precautions:  Precautions Precautions: Fall Precaution Comments: chronic left hemiplegia Required Braces or Orthoses: Other Brace/Splint Other Brace/Splint: patient's wife brought in [name] with metal uprights Restrictions Weight Bearing Restrictions: No General:   Vital Signs: Therapy Vitals Temp: 97.3 F (36.3 C) Temp src: Oral Pulse Rate: 90 Resp: 20 BP: 91/49 mmHg Patient  Position, if appropriate: Standing Oxygen Therapy SpO2: 96 % O2 Device: None (Room air) Pain:  Pt with no c/o pain this session.  See FIM for current functional status  Therapy/Group: Individual Therapy  Rosalio LoudHOXIE, Lizanne Erker 01/01/2014, 9:33 AM

## 2014-01-01 NOTE — Progress Notes (Signed)
Patient ID: [name] [name], male   DOB: June 27, 1931, 80 y.o.   MRN: [id number]   SUBJECTIVE:  80 y/o male admitted with a/c respiratory failure in the setting of acute bronchitis and a/c diastolic HF.   Orthostatic over weekend and lasix cut back. Feels fine. Denies dizziness. Denies SOB. No further epistaxis.  Creatinine 1.6>1.6 >1.4>1.4>1.2   . antiseptic oral rinse  15 mL Mouth Rinse q12n4p  . atorvastatin  10 mg Oral Daily  . bacitracin   Topical BID  . diltiazem  240 mg Oral Daily  . famotidine  20 mg Oral QHS  . furosemide  40 mg Oral Daily  . glimepiride  1 mg Oral Q breakfast  . guaiFENesin  600 mg Oral BID  . hydrocerin   Topical BID  . insulin aspart  0-9 Units Subcutaneous TID WC  . linagliptin  5 mg Oral Daily  . multivitamin with minerals  1 tablet Oral Daily  . oxymetazoline  1 spray Each Nare BID  . sodium chloride  4 spray Each Nare 6 X Daily  . Warfarin - Pharmacist Dosing Inpatient   Does not apply q1800    Filed Vitals:   01/01/14 0500 01/01/14 0632 01/01/14 0638 01/01/14 0642  BP:  108/56 94/49 91/49   Pulse:  70 65 90  Temp:  97.3 F (36.3 C)    TempSrc:  Oral    Resp:  19 19 20   Height:      Weight: 76.3 kg (168 lb 3.4 oz)     SpO2:  94% 95% 96%    Intake/Output Summary (Last 24 hours) at 01/01/14 0925 Last data filed at 01/01/14 [id number]  Gross per 24 hour  Intake    638 ml  Output   2751 ml  Net  -2113 ml    LABS: Basic Metabolic Panel:  Recent Labs  11/91/4701/19/15 0339  NA 134*  K 4.2  CL 96  CO2 27  GLUCOSE 62*  BUN 28*  CREATININE 1.23  CALCIUM 9.1   Liver Function Tests: No results found for this basename: AST, ALT, ALKPHOS, BILITOT, PROT, ALBUMIN,  in the last 72 hours No results found for this basename: LIPASE, AMYLASE,  in the last 72 hours CBC: No results found for this basename: WBC, NEUTROABS, HGB, HCT, MCV, PLT,  in the last 72 hours Cardiac Enzymes: No results found for this basename: CKTOTAL, CKMB, CKMBINDEX, TROPONINI,  in  the last 72 hours   PHYSICAL EXAM General: NAD. Elderly. Sitting in wheelchair  Neck: JVP 5-6 no thyromegaly or thyroid nodule.  Lungs: Decreased in the bases CV: Nondisplaced PMI. Distant HS.Marland Kitchen. Heart irregular S1/S2, no S3/S4, no murmur. NO edema   Abdomen: Soft, nontender, no hepatosplenomegaly, no distention.  Neurologic: Alert and oriented x 3. LUE weakness and swelling;  Psych: Normal affect. Extremities: No clubbing or cyanosis. LLE brace GU: Foley   ASSESSMENT AND PLAN: 80 yo with history of chronic atrial fibrillation, COPD, and diastolic CHF admitted with respiratory failure, suspect combination of acute/chronic diastolic CHF and acute exacerbation of COPD.  Continuing on rehab.   1. Pulmonary: Stable.   2. Acute on chronic diastolic CHF:  3. Atrial fibrillation with RVR: Controlled, on diltiazem CD. On coumadin 4. Acute on chronic kidney injury: Creatinine stable/improved 5. Epistaxis: Packing removed 1/8 by ENT. Resolved.   6. Urinary retention.   Doing well. Orthostatic over the weekend. Lasix cut back to 40 daily. Now improved. Renal function stable. Will continue.   Daniel Bensimhon,MD 9:25 AM

## 2014-01-01 NOTE — Plan of Care (Signed)
Problem: RH BLADDER ELIMINATION Goal: RH STG MANAGE BLADDER WITH ASSISTANCE STG Manage Bladder With Assistance maxx assist  Outcome: Not Progressing Coude foley discontinued today @ 1000 am, no void, in and cath performed with 16 French coude catheter, total assist of staff, patient tolerated without difficulty drained 250 cc

## 2014-01-01 NOTE — Progress Notes (Signed)
Physical Therapy Session Note  Patient Details  Name: [name] [name] MRN: [id number] Date of Birth: 03/04/1931  Today's Date: 01/01/2014 Time: 0900-1000 and [id number]  Time Calculation (min): 60 min and 41 min   Short Term Goals: Week 2:  PT Short Term Goal 1 (Week 2): = LTG of supervision-min A overall with RW  Skilled Therapeutic Interventions/Progress Updates:   Pt reporting rough day yesterday.  Pt BP low again today.  Vitals assessed in sitting and standing with slight drop in BP; pt denies dizziness or lightheadedness.  Discussed with pt progress towards goals and concern about falls at home as LE fatigue and wife's ability to assist physically.  Pt reports that he has a w/c at home that stays in the carport for community distances/use.  Discussed with pt if he would be able to use the w/c in the home for mobility if he was feeling weak.  Pt feels that his home is w/c accessible but will confirm with wife.  Had pt perform w/c mobility and stair negotiation training to simulate home.  See below for details.  Pt given multiple rest breaks secondary to fatigue and for deep breathing; Sp02 remained above 90% during mobility.  Performed 15 reps standing terminal hip and knee closed chain extension against resistance of orange theraband pulling from behind knee (knee flexion moment) with focus on trunk elongation and sustained L hip and knee extension activation with bilat UE support on RW.  Returned to room total A to rest in w/c.  Second session: Pt wife present for further family education; discussed pt current functional level and possible use of w/c for home mobility.  Discussed home accessibility for w/c.  Wife reports that pt has used w/c in the home but he mainly stays at the kitchen table and ambulates short distances to bedroom and bathroom right beside kitchen.  Discussed pt's falls risk as he fatigues.  Wife verbalized agreement and reports that her son in law can help her bring it into the  house and out of the house if the pt has a MD appointment.  Wife reports that 2WW should fit in bathroom but w/c would not.  Attempted lateral stepping to L and R 2-3 feet each direction with RW in room with mod A; upon standing pt demonstrated DOE and began to report dizziness.  Returned to w/c and guided pt through pursed lip breathing.  Unable to get BP or Sp02 upon sitting but RN alerted.  Recommending that pt be mainly w/c level at home, ambulate very short distances when not fatigued and continue ambulation training with HHPT.  Transported pt to gym in w/c total A; demonstrated to wife how pt has been performing stairs laterally with bilat UE support on L rail ascending with RLE and descending with LLE.  Pt gave repeat demonstration up/down 4 stairs 6.5 inches tall with max A overall secondary to increased height of stairs.  Pt required increased physical assistance to advance weight forwards/up to the next step secondary to RLE weakness with extension and assistance during descent to fully abduct LLE.  One episode of RLE collapsing and inability to advance LLE or stand on RLE and required total A to recover.  Wife does not feel comfortable providing this much assistance.  Recommending that son in law be at home when pt D/C to assist with home entry.  Returned to room in w/c to rest before OT session.  Wife to return tomorrow for more family education.  Therapy Documentation Precautions:  Precautions Precautions: Fall Precaution Comments: chronic left hemiplegia Required Braces or Orthoses: Other Brace/Splint Other Brace/Splint: patient's wife brought in [name] with metal uprights Restrictions Weight Bearing Restrictions: No Vital Signs: Therapy Vitals Temp: 97.3 F (36.3 C) Temp src: Oral Pulse Rate: 99 Resp: 20 BP: 98/61 mmHg Patient Position, if appropriate: Standing Oxygen Therapy SpO2: 96 % O2 Device: None (Room air) Pulse Oximetry Type: Intermittent Pain: Pain Assessment Pain  Assessment: No/denies pain Locomotion : Stairs / Management consultant Assistance: 4: Min assist for lower steps but pt progressively required mod-max A as step height increased and pt experience LE fatigue and required increased assistance to place L foot during descent and assistance to advance weight forward while ascending. Stair Management Technique: One rail Left;Step to pattern;Sideways Number of Stairs: 3 (x 2) at 4" and 1 step at 6" tall with L rail to simulate taller step height Height of Stairs: 4 and 6 in Wheelchair Mobility Wheelchair Mobility: Yes Wheelchair Assistance: 3: Mod Education officer, museum: Right upper extremity;Right lower extremity Wheelchair Parts Management: Needs assistance Distance: 25 with verbal and tactile cues for R hemi technique for possible home w/c mobility training.  Will discuss further with wife about accessibility of home for w/c mobility.     See FIM for current functional status  Therapy/Group: Individual Therapy  Edman Circle [facility] 01/01/2014, 9:38 AM

## 2014-01-01 NOTE — Plan of Care (Signed)
Problem: RH BOWEL ELIMINATION Goal: RH STG MANAGE BOWEL WITH ASSISTANCE STG Manage Bowel with Assistance.maxx assist  Outcome: Not Progressing Pt disimpacted 12/31/13, incontinent of stool 01/01/14, total assist of staff

## 2014-01-02 ENCOUNTER — Inpatient Hospital Stay (HOSPITAL_COMMUNITY): Payer: Medicare HMO | Admitting: Occupational Therapy

## 2014-01-02 ENCOUNTER — Inpatient Hospital Stay (HOSPITAL_COMMUNITY): Payer: Medicare Other

## 2014-01-02 ENCOUNTER — Inpatient Hospital Stay (HOSPITAL_COMMUNITY): Payer: Medicare Other | Admitting: Occupational Therapy

## 2014-01-02 ENCOUNTER — Encounter: Payer: Self-pay | Admitting: Vascular Surgery

## 2014-01-02 LAB — GLUCOSE, CAPILLARY
GLUCOSE-CAPILLARY: 118 mg/dL — AB (ref 70–99)
Glucose-Capillary: 78 mg/dL (ref 70–99)
Glucose-Capillary: 144 mg/dL — ABNORMAL HIGH (ref 70–99)
Glucose-Capillary: 135 mg/dL — ABNORMAL HIGH (ref 70–99)

## 2014-01-02 LAB — PROTIME-INR
INR: 3.06 — ABNORMAL HIGH (ref 0.00–1.49)
Prothrombin Time: 30.5 seconds — ABNORMAL HIGH (ref 11.6–15.2)

## 2014-01-02 MED ORDER — WARFARIN SODIUM 4 MG PO TABS
4.0000 mg | ORAL_TABLET | Freq: Every day | ORAL | Status: DC
  Filled 2014-01-02: qty 1

## 2014-01-02 MED ORDER — WARFARIN SODIUM 1 MG PO TABS
1.0000 mg | ORAL_TABLET | Freq: Once | ORAL | Status: AC
  Administered 2014-01-02: 1 mg via ORAL
  Filled 2014-01-02: qty 1

## 2014-01-02 NOTE — Progress Notes (Signed)
Social Work Patient ID: [name] [name], male   DOB: 14-Sep-1931, 80 y.o.   MRN: [id number] Met with pt and wife to discuss how family education went this am.  Both very pleased with how well it went and pt feels he has rebounded from this Weekend.  Preparing for discharge tomorrow.

## 2014-01-02 NOTE — Progress Notes (Signed)
x ANTICOAGULATION CONSULT NOTE - Follow Up Consult  Pharmacy Consult for Coumadin Indication: atrial fibrillation and RLE DVT  No Known Allergies  Patient Measurements: Height: 5\' 7"  (170.2 cm) Weight: 168 lb 3.4 oz (76.3 kg) IBW/kg (Calculated) : 66.1 Heparin Dosing Weight:   Vital Signs: Temp: 97 F (36.1 C) (01/20 0445) Temp src: Oral (01/20 0445) BP: 111/62 mmHg (01/20 0746) Pulse Rate: 109 (01/20 0451)  Labs:  Recent Labs  12/31/13 0621 01/01/14 0339 01/02/14 0625  LABPROT 26.5* 27.8* 30.5*  INR 2.54* 2.71* 3.06*  CREATININE  --  1.23  --     Estimated Creatinine Clearance: 44.8 ml/min (by C-G formula based on Cr of 1.23).   Medications:  Scheduled:  . antiseptic oral rinse  15 mL Mouth Rinse q12n4p  . atorvastatin  10 mg Oral Daily  . bacitracin   Topical BID  . diltiazem  240 mg Oral Daily  . famotidine  20 mg Oral QHS  . furosemide  40 mg Oral Daily  . glimepiride  1 mg Oral Q breakfast  . guaiFENesin  600 mg Oral BID  . hydrocerin   Topical BID  . insulin aspart  0-9 Units Subcutaneous TID WC  . linagliptin  5 mg Oral Daily  . multivitamin with minerals  1 tablet Oral Daily  . sodium chloride  4 spray Each Nare 6 X Daily  . warfarin  4 mg Oral q1800  . Warfarin - Pharmacist Dosing Inpatient   Does not apply q1800    Assessment: 80yo male with AFib and RLE DVT.  INR 3.06- accumulated with recent higher doses.  No bleeding problems noted.  Will give smaller dose of COumadin today then resume 4mg  daily.  Goal of Therapy:  INR 2-3 Monitor platelets by anticoagulation protocol: Yes   Plan:  Coumadin 1mg  today Resume Coumadin 4mg  daily on 1/21 Daily INR  Marisue HumbleKendra Mert Dietrick, PharmD Clinical Pharmacist [facility]

## 2014-01-02 NOTE — Progress Notes (Signed)
Occupational Therapy Discharge Summary  Patient Details  Name: [name] [name] MRN: [id number] Date of Birth: 01/02/1931  Today's Date: 01/02/2014  Patient has met 10 of 10 long term goals due to improved activity tolerance, improved balance and postural control.  Patient to discharge at [facility] Assist level.  Patient's care partner is independent to provide the necessary physical and cognitive assistance at discharge.    Reasons goals not met: N/A  Recommendation:  Patient will benefit from ongoing skilled OT services in home health setting to continue to advance functional skills in the area of BADL and Reduce care partner burden.  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) Visual History: Cataracts Patient Visual Report: No change from baseline Vision - Assessment Vision Assessment: Vision not tested Praxis Praxis: Impaired Praxis Impairment Details: Motor planning Praxis-Other Comments: Requires increased time and min cues for motor planning  Cognition Overall Cognitive Status: History of cognitive impairments - at baseline Arousal/Alertness: Awake/alert Orientation Level: Oriented X4 Attention: Sustained Memory: Impaired Memory Impairment: Retrieval deficit Problem Solving: Impaired Problem Solving Impairment: Functional complex Sequencing: Impaired Safety/Judgment: Appears intact Sensation Sensation Light Touch: Appears  Intact Stereognosis: Not tested Hot/Cold: Not tested Proprioception: Appears Intact Coordination Gross Motor Movements are Fluid and Coordinated: No Fine Motor Movements are Fluid and Coordinated: No Coordination and Movement Description: left hemiparesis Finger Nose Finger Test: able to perform bilaterally; some slowing on left Motor  Motor Motor - Skilled Clinical Observations: avoids weight shift over left hip in sitting Extremity/Trunk Assessment RUE Assessment RUE Assessment: Within Functional Limits LUE Assessment LUE Assessment: Exceptions to [facility] LUE Strength LUE Overall Strength: Deficits;Due to premorbid status LUE Overall Strength Comments: shld flexion to ~ 90 degrees; active elbow flexion against gravity; gross grasp and release  See FIM for current functional status  Marlos Carmen, [facility] 01/02/2014, 12:24 PM

## 2014-01-02 NOTE — Progress Notes (Signed)
Occupational Therapy Session Note  Patient Details  Name: [name] GoodyBobby Gitto MRN: [id number] Date of Birth: 1931/06/05  Today's Date: 01/02/2014 Time: 0730-0830 and [id number] Time Calculation (min): 60 min and 25 min  Short Term Goals: Week 2:  OT Short Term Goal 1 (Week 2): Patient will stand at sink for hygiene task with supervision OT Short Term Goal 2 (Week 2): Pt will wash LB with min assist using AE as needed OT Short Term Goal 3 (Week 2): Pt will dress LB with min assist using AE as needed OT Short Term Goal 4 (Week 2): Pt will complete toilet transfer with min assist using appropriate AD  Skilled Therapeutic Interventions/Progress Updates:    1) Pt seen for family education with wife in preparation for d/c home tomorrow.  Completed bed mobility, toilet transfer, and bathing/dressing with focus on safe techniques and education of wife.  Pt completed bed mobility with mod assist this session due to decreased carryover of technique from yesterday. Stand pivot transfer bed > w/c with RW with min/steady assist.  While bathing at sink pt reports need to have BM. Performed stand pivot transfer to toilet with BSC over toilet to raise height with min assist with use of RW.  Pt able to complete hygiene with steady assist.  Bathing and dressing completed at sink with pt only requiring min/steady assist while standing to complete perineal hygiene and then assist to pull pants over hips.  Pt's wife stood with pt while he completed LB dressing and assisted with pants, reporting she feels comfortable assisting this way.  Pt donned socks and shoes with increased time and assist only to don Lt shoe, which wife assisted with PTA.  Wife reports comfortable with assisting pt at this level.  Educated on w/c level mainly with exception of ambulating in bathroom with RW short distance due to w/c unable to fit through bathroom door.  2) Pt seen for family education with wife with focus on sit > stand and stand pivot  transfers with RW.  Engaged in sit> stand at sink with pt with improved carryover of hand placement and able to verbalize appropriate hand placement.  Wife able to provide steady assist when needed.  Wife assisted with transferring pt to and from toilet in room bathroom with use of RW.  Again discussed BSC next to bed at night and over toilet during day to increase safety and decreased need for lifting. Pt's wife reports all questions have been answered and feels ready for d/c tomorrow.  Therapy Documentation Precautions:  Precautions Precautions: Fall Precaution Comments: chronic left hemiplegia Required Braces or Orthoses: Other Brace/Splint Other Brace/Splint: patient's wife brought in [name] with metal uprights Restrictions Weight Bearing Restrictions: No General:   Vital Signs: Therapy Vitals BP: 111/62 mmHg Pain:  Pt with no c/o pain  See FIM for current functional status  Therapy/Group: Individual Therapy  Rosalio LoudHOXIE, Toneshia Coello 01/02/2014, 9:42 AM

## 2014-01-02 NOTE — Progress Notes (Signed)
Subjective/Complaints:  Wife training with OT now feels comfortable with care, Voided three times since last noc Review of Systems - Negative except occ cough Objective: Vital Signs: Blood pressure 111/62, pulse 109, temperature 97 F (36.1 C), temperature source Oral, resp. rate 19, height 5' 7"  (1.702 m), weight 76.3 kg (168 lb 3.4 oz), SpO2 92.00%. No results found. Results for orders placed during the hospital encounter of 12/20/13 (from the past 72 hour(s))  GLUCOSE, CAPILLARY     Status: Abnormal   Collection Time    12/30/13 12:06 PM      Result Value Range   Glucose-Capillary 174 (*) 70 - 99 mg/dL  GLUCOSE, CAPILLARY     Status: Abnormal   Collection Time    12/30/13  4:35 PM      Result Value Range   Glucose-Capillary 137 (*) 70 - 99 mg/dL  GLUCOSE, CAPILLARY     Status: None   Collection Time    12/30/13  9:20 PM      Result Value Range   Glucose-Capillary 92  70 - 99 mg/dL   Comment 1 Notify RN    PROTIME-INR     Status: Abnormal   Collection Time    12/31/13  6:21 AM      Result Value Range   Prothrombin Time 26.5 (*) 11.6 - 15.2 seconds   INR 2.54 (*) 0.00 - 1.49  GLUCOSE, CAPILLARY     Status: None   Collection Time    12/31/13  7:03 AM      Result Value Range   Glucose-Capillary 82  70 - 99 mg/dL   Comment 1 Notify RN    GLUCOSE, CAPILLARY     Status: Abnormal   Collection Time    12/31/13 11:26 AM      Result Value Range   Glucose-Capillary 173 (*) 70 - 99 mg/dL  GLUCOSE, CAPILLARY     Status: Abnormal   Collection Time    12/31/13  4:59 PM      Result Value Range   Glucose-Capillary 119 (*) 70 - 99 mg/dL  GLUCOSE, CAPILLARY     Status: Abnormal   Collection Time    12/31/13  9:10 PM      Result Value Range   Glucose-Capillary 100 (*) 70 - 99 mg/dL   Comment 1 Notify RN    PROTIME-INR     Status: Abnormal   Collection Time    01/01/14  3:39 AM      Result Value Range   Prothrombin Time 27.8 (*) 11.6 - 15.2 seconds   INR 2.71 (*) 0.00 - 9.62   BASIC METABOLIC PANEL     Status: Abnormal   Collection Time    01/01/14  3:39 AM      Result Value Range   Sodium 134 (*) 137 - 147 mEq/L   Potassium 4.2  3.7 - 5.3 mEq/L   Chloride 96  96 - 112 mEq/L   CO2 27  19 - 32 mEq/L   Glucose, Bld 62 (*) 70 - 99 mg/dL   BUN 28 (*) 6 - 23 mg/dL   Creatinine, Ser 1.23  0.50 - 1.35 mg/dL   Calcium 9.1  8.4 - 10.5 mg/dL   GFR calc non Af Amer 54 (*) >90 mL/min   GFR calc Af Amer 62 (*) >90 mL/min   Comment: (NOTE)     The eGFR has been calculated using the CKD EPI equation.     This calculation has not been validated  in all clinical situations.     eGFR's persistently <90 mL/min signify possible Chronic Kidney     Disease.  GLUCOSE, CAPILLARY     Status: None   Collection Time    01/01/14  7:37 AM      Result Value Range   Glucose-Capillary 87  70 - 99 mg/dL   Comment 1 Notify RN    GLUCOSE, CAPILLARY     Status: Abnormal   Collection Time    01/01/14 11:15 AM      Result Value Range   Glucose-Capillary 159 (*) 70 - 99 mg/dL   Comment 1 Notify RN    GLUCOSE, CAPILLARY     Status: Abnormal   Collection Time    01/01/14  4:56 PM      Result Value Range   Glucose-Capillary 120 (*) 70 - 99 mg/dL  GLUCOSE, CAPILLARY     Status: Abnormal   Collection Time    01/01/14  9:16 PM      Result Value Range   Glucose-Capillary 104 (*) 70 - 99 mg/dL  PROTIME-INR     Status: Abnormal   Collection Time    01/02/14  6:25 AM      Result Value Range   Prothrombin Time 30.5 (*) 11.6 - 15.2 seconds   INR 3.06 (*) 0.00 - 1.49  GLUCOSE, CAPILLARY     Status: None   Collection Time    01/02/14  7:23 AM      Result Value Range   Glucose-Capillary 78  70 - 99 mg/dL   Comment 1 Notify RN       HEENT: normal, no evidence of epistaxis Cardio: irregular and no murmurs, rate controlled Resp: CTA B/L and unlabored GI: BS positive and non distended Extremity:  Pulses absent Bilat pedal and R pop and No Edema Skin:   Intact but dry Neuro:  Alert/Oriented, Cranial Nerve Abnormalities L central 7, Normal Sensory, Abnormal Motor 3/5 L deltoid, bi, tri, grip,L quad, HF 2- ankle DF,  and Abnormal FMC Ataxic/ dec FMC Musc/Skel:  Other no pain with UE and LE ROM Gen NAD   Assessment/Plan: 1. 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 Physiatrist is providing close team supervision and 24 hour management of active medical problems listed below. Physiatrist and rehab team continue to assess barriers to discharge/monitor patient progress toward functional and medical goals. Should be ready for D/C in am FIM: FIM - Bathing Bathing Steps Patient Completed: Chest;Right Arm;Left Arm;Abdomen;Right upper leg;Left upper leg;Front perineal area;Buttocks Bathing: 4: Min-Patient completes 8-9 [id number] parts or 75+ percent  FIM - Upper Body Dressing/Undressing Upper body dressing/undressing steps patient completed: Thread/unthread right sleeve of pullover shirt/dresss;Thread/unthread left sleeve of pullover shirt/dress;Put head through opening of pull over shirt/dress;Pull shirt over trunk Upper body dressing/undressing: 5: Set-up assist to: Obtain clothing/put away FIM - Lower Body Dressing/Undressing Lower body dressing/undressing steps patient completed: Thread/unthread left underwear leg;Thread/unthread left pants leg;Don/Doff right sock;Don/Doff left sock;Don/Doff right shoe;Fasten/unfasten right shoe;Fasten/unfasten left shoe Lower body dressing/undressing: 3: Mod-Patient completed 50-74% of tasks  FIM - Toileting Toileting steps completed by patient: Adjust clothing prior to toileting Toileting Assistive Devices: Grab bar or rail for support Toileting: 2: Max-Patient completed 1 of 3 steps  FIM - ToRadio producerevices:  BeNurse, learning disabilityransfers: 4-To toilet/BSC: Min A (steadying Pt. >  75%);4-From toilet/BSC: Min A (steadying Pt. > 75%)  FIM - Bed/Chair Transfer Bed/Chair Transfer Assistive Devices: Copy: 3: Chair or W/C > Bed: Mod A (lift or lower assist);3: Bed > Chair or W/C: Mod A (lift or lower assist)  FIM - Locomotion: Wheelchair Distance: 25 Locomotion: Wheelchair: 1: Travels less than 50 ft with moderate assistance (Pt: 50 - 74%) FIM - Locomotion: Ambulation Locomotion: Ambulation Assistive Devices: Administrator Ambulation/Gait Assistance: 3: Mod assist Locomotion: Ambulation: 0: Activity did not occur  Comprehension Comprehension Mode: Auditory Comprehension: 5-Understands basic 90% of the time/requires cueing < 10% of the time  Expression Expression Mode: Verbal Expression: 5-Expresses basic needs/ideas: With extra time/assistive device  Social Interaction Social Interaction: 6-Interacts appropriately with others with medication or extra time (anti-anxiety, antidepressant).  Problem Solving Problem Solving: 5-Solves basic 90% of the time/requires cueing < 10% of the time  Memory Memory: 4-Recognizes or recalls 75 - 89% of the time/requires cueing 10 - 24% of the time  Medical Problem List and Plan:  Deconditioning related acute renal failure, hypoxia, and multiple medical issues  1. RLE DVT/A fib/Anticoagulation: Pharmaceutical: Coumadin per pharm protocol 2. Pain Management: prn tylenol    3. Mood: Will have LCSW follow for evaluation.  4. Neuropsych: This patient is capable of making decisions on his own behalf.  5. Epistaxis:resolved                                                                   6. Cough resolved off abx today 7. AECOPD: Respiratory status improving and slow taper of steroids. Continue nebs and IS.  8. Acute on chronic CHF: Compensated--continue low salt diet with 1500 cc fluid restriction.Lasix held per cardiology daily  weights 1kg down at 76kg. Daily monitoring of I's and O's    9. A fib: monitor HR with bid checks. Continue diltiazem 240 mg daily.  10. GU: Foley, s/p paraphimosis reduction per GU,UA -,  voiding trial in progress 11. DM improving , glimipiride and linagliptin 12.  Orthostaic hypotension, multifactorial, meds, mild dehydration, off flomax LOS (Days) 13 A FACE TO FACE EVALUATION WAS PERFORMED  KIRSTEINS,ANDREW E 01/02/2014, 8:26 AM

## 2014-01-02 NOTE — Progress Notes (Signed)
Physical Therapy Session Note  Patient Details  Name: [name] [name] MRN: [id number] Date of Birth: [birth year]  Today's Date: 01/02/2014 Time: 9:00 - 10:00 and 1300-1345 Time Calculation (min): 60 min and 45 min  Short Term Goals: Week 2:  PT Short Term Goal 1 (Week 2): = LTG of supervision-min A overall with RW  Skilled Therapeutic Interventions/Progress Updates:  Session 1: Focused on family education with wife and grad day activities. Patient sitting in wheelchair upon entering room. Patient propelled wheelchair using hemi technique 40 feet in controlled environment with supervision. Patient and wife performed wheelchair to bed transfer x 2 stand pivot with RW and double upright brace on left. Patient required occasional min assist from wife for sit to stand. Wife required some cueing for wheelchair set up (removing footrests, locking brakes, etc) and technique (scooting to edge and hand placement). Wife also needed to be reminded to stay close to patient during transfer while he is on his feet. Patient performed car transfer with wife and min assist to lift left leg into car. Wife demonstrated how to fold wheelchair and remove/replace footrests. Patient ambulated up and down 2 steps with 1 rail and mod to max assist. Patient reported having to go to bathroom. Patient assisted to toilet with min assist. Patient able to adjust clothing prior to toileting and performed hygiene. Patient left in wheelchair with wife in room.  Session 2: Patient in wheelchair. Patient ambulated up and down 3 steps with mod to max assist. Wife reports that son in law or grandson will assist on stairs. Plan to review with them tomorrow prior to discharge. Patient ambulated 12 feet and 15 feet with rolling walker and min assist. Patient with narrow base of support and required cueing for foot placement. Patient worked on side stepping with RW through simulated narrow doorway to assist with bathroom entry at home. Patient  assisted to toilet with min assist. Patient had to use bathroom again. Patient able to adjust clothing prior to toileting and performed hygiene. Patient left in wheelchair with wife in room and all items in reach.  Therapy Documentation Precautions:  Precautions Precautions: Fall Precaution Comments: chronic left hemiplegia Required Braces or Orthoses: Other Brace/Splint Other Brace/Splint: patient's wife brought in [name] with metal uprights Restrictions Weight Bearing Restrictions: No Pain: Pain Assessment Pain Assessment: No/denies pain Locomotion : Ambulation Ambulation/Gait Assistance: 4: Min assist Wheelchair Mobility Distance: 40   See FIM for current functional status  Therapy/Group: Individual Therapy  Alma FriendlyWindsor, Himani Corona M 01/02/2014, 3:43 PM

## 2014-01-02 NOTE — Discharge Summary (Signed)
Physician Discharge Summary  Patient ID: [name] [name] MRN: [id number] DOB/AGE: Sep 29, 1931 80 y.o.  Admit date: 12/20/2013 Discharge date: 01/03/2014  Discharge Diagnoses:  Principal Problem:   Physical deconditioning Active Problems:   Hypotension   DM type 2, controlled, with complication   Acute renal failure   Anemia   Acute on chronic diastolic CHF (congestive heart failure)   Epistaxis   Paraphimosis   Discharged Condition: Stable  Significant Diagnostic Studies: Dg Chest 2 View  12/21/2013   CLINICAL DATA:  Pneumonia  EXAM: CHEST  2 VIEW  COMPARISON:  12/18/2003  FINDINGS: Mild improvement in right lower lobe airspace disease. Underlying COPD is suspected. There is cardiac enlargement without heart failure or effusion.  IMPRESSION: Slight improvement in right lower lobe infiltrate, consistent with pneumonia.   Electronically Signed   By: Marlan Palau M.D.   On: 12/21/2013 08:43    Labs:  Basic Metabolic Panel:  Recent Labs Lab 12/29/13 0545 01/01/14 0339  NA 133* 134*  K 4.7 4.2  CL 93* 96  CO2 28 27  GLUCOSE 58* 62*  BUN 33* 28*  CREATININE 1.45* 1.23  CALCIUM 9.3 9.1    CBC:     Component Value Date/Time   WBC 15.3* 12/21/2013 0604   RBC 4.57 12/21/2013 0604   HGB 14.1 12/21/2013 0604   HCT 41.8 12/21/2013 0604   PLT 230 12/21/2013 0604   MCV 91.5 12/21/2013 0604   MCH 30.9 12/21/2013 0604   MCHC 33.7 12/21/2013 0604   RDW 14.9 12/21/2013 0604   LYMPHSABS 2.4 12/21/2013 0604   MONOABS 1.2* 12/21/2013 0604   EOSABS 0.0 12/21/2013 0604   BASOSABS 0.0 12/21/2013 0604     CBG:  Recent Labs Lab 01/02/14 0723 01/02/14 1112 01/02/14 1634 01/02/14 2222 01/03/14 0723  GLUCAP 78 144* 135* 118* 80    Brief HPI:   [name] [name] is a 80 y.o. male with history of COPD--O2 at nights, h/o CVA with L-HP and memory deficits, chronic CHF, A fib with RVR as well as recent admission for ischemic RLE requiring R- femoral embolectomy due to embolic event. [name] on  12/10/13 with worsening of dyspnea, anasarca, acute renal failure and left foot pain. He was treated for acute on chronic renal failure with IV diuresis and started on antibiotics for AECOPD v/s viral bronchitis and treated with nebs and steroids with improvement. He developed acute epistaxis on 01/02/124 treated with chemical cautery and nasal packing by Dr. Annalee Genta with recommendations for continued antibiotic coverage. Follow up doppler with thrombus right posterior tibial vein and no DVT LLE. Coumadin resumed 12/18/13 --he has had intermittent bleeding. Patient was noted to be deconditioned and CIR was recommended by rehab team.   Hospital Course: [name] [name] was admitted to rehab 12/20/2013 for inpatient therapies to consist of PT, ST and OT at least three hours five days a week. Past admission physiatrist, therapy team and rehab RN have worked together to provide customized collaborative inpatient rehab. He was maintained on coumadin and INR is therapeutic at discharge. Nasal packing was removed by Dr. Annalee Genta on 12/21/13 and he has had resolution of epistaxis.  Diabetes was monitored with ac/hs checks and SSI initially. As blood sugars started  trending up, Trajenta and Amaryl were added with improvement in blood sugar control.  He has had problems with overflow incontinence and has required in and out cath on qid basis. Urine culture was negative. He did develop paraphimosis requiring reduction by Dr. Margarita Grizzle on 12/25/13. Foley  was replaced for bladder rest and repeat voiding trail was initiated on 01/08/14 with scheduled toileting. He is currently voiding without difficulty. He was noted to have strong urine at discharge and was started on keflex for UTI.   He was monitored closely for signs of overload and daily weights were checked. He has had problems with dizziness due to orthostasis and low blood pressure. Dr. Clarise Cruz has followed for input and medications were adjusted to help reduce  symptoms. His lasix was decreased to 40 mg daily and weight is stable at 76.3 kg. Heart rate has been controlled on Cardizem. Acute renal failure is resolving and patient has required encouragement to drink at least 1500 cc of fluid daily. Follow up CXR done past admission showed some improvement in RLL infiltrate and he was treated with 2 week course of Levaquin for probable pneumonia.  Respiratory status has improved but he continues to have poor endurance levels.   Patient and family were educated on energy conservation measures and were advised not to walk at home at this time.  Activity to be limited to transfers and safety with ambulation to be decided by home health therapist.  Dr. Mathis Bud has graciously agreed to manage coumadin past discharge. INR is supratherapeutic at 3.12 therefore patient is to hold coumadin today and resume at 4 mg daily tomorrow pm. Next protime is to be drawn by [facility] on 01/05/13 with results to Dr. Mathis Bud.    Rehab course: During patient's stay in rehab weekly team conferences were held to monitor patient's progress, set goals and discuss barriers to discharge. Patient has had improvement in activity tolerance, balance, postural control, as well as ability to compensate for deficits. He is able to complete bathing and dressing tasks with min assist.  He is able to ambulate 40 feet with supervision. He occasionally requires min assist for sit to stand. He is able to navigate two stairs with mod to max assist. Wife has been educated on all aspects of self care, mobility as well as wheelchair management. Son was educated on with stair navigation and male family members to assist with this activity. Patient will continue to receive Home Health PT/OT by [facility] past discharge.    Disposition: 01-Home or Self Care   Diet:  Cardiac diet. Low salt. 1500 cc fluid/day.   Special Instructions: 1. Weight yourself daily .  2. Limit walking to transfers and toileting tasks.         Future Appointments Provider Department Dept Phone   01/17/2014 1:30 PM Chuck Hint, MD [facility] [phone]       Medication List    STOP taking these medications       enoxaparin 80 MG/0.8ML injection  Commonly known as:  LOVENOX     glipiZIDE 10 MG 24 hr tablet  Commonly known as:  GLUCOTROL XL     levofloxacin 750 MG tablet  Commonly known as:  LEVAQUIN     oxymetazoline 0.05 % nasal spray  Commonly known as:  AFRIN     potassium chloride SA 20 MEQ tablet  Commonly known as:  K-DUR,KLOR-CON     terazosin 5 MG capsule  Commonly known as:  HYTRIN      TAKE these medications       atorvastatin 10 MG tablet  Commonly known as:  LIPITOR  Take 10 mg by mouth daily.     cephALEXin 250 MG capsule  Commonly known as:  KEFLEX  Take 1  capsule (250 mg total) by mouth 3 (three) times daily.     diltiazem 240 MG 24 hr capsule  Commonly known as:  CARDIZEM CD  Take 1 capsule (240 mg total) by mouth daily.     furosemide 80 MG tablet  Commonly known as:  LASIX  Take 0.5 tablets (40 mg total) by mouth daily.     glimepiride 1 MG tablet  Commonly known as:  AMARYL  Take 1 tablet (1 mg total) by mouth daily with breakfast.     guaiFENesin 600 MG 12 hr tablet  Commonly known as:  MUCINEX  Take 1 tablet (600 mg total) by mouth 2 (two) times daily.     levalbuterol 0.63 MG/3ML nebulizer solution  Commonly known as:  XOPENEX  Take 3 mLs (0.63 mg total) by nebulization every 6 (six) hours as needed for wheezing or shortness of breath.     linagliptin 5 MG Tabs tablet  Commonly known as:  TRADJENTA  Take 1 tablet (5 mg total) by mouth daily.     multivitamin with minerals Tabs tablet  Take 1 tablet by mouth daily.     ranitidine 150 MG tablet  Commonly known as:  ZANTAC  Take 1 tablet (150 mg total) by mouth 2 (two) times daily.     sodium chloride 0.65 % Soln nasal spray  Commonly known as:  OCEAN  Place 4 sprays  into both nostrils 6 (six) times daily.     traMADol-acetaminophen 37.5-325 MG per tablet  Commonly known as:  ULTRACET  Take 1 tablet by mouth every 8 (eight) hours as needed for moderate pain or severe pain.     warfarin 2 MG tablet  Commonly known as:  COUMADIN  - Do not take any coumadin today.   - Take two pills with supper starting 01/04/14.       Follow-up Information   Follow up with Lucianne LeiUPPIN,NINA, MD On 01/16/2014. (APPT @ 12:30 PM)    Specialty:  Internal Medicine   Contact information:   [address] [place] [address] [phone]       Call Erick ColaceKIRSTEINS,ANDREW E, MD. (As needed)    Specialty:  Physical Medicine and Rehabilitation   Contact information:   [address] [place] [address] [phone]       Follow up with Arvilla Meresaniel Bensimhon, MD. Call today. (for appointment)    Specialty:  Cardiology   Contact information:   [address] [place] [address] [phone]       Signed: Jacquelynn CreeLove, Rishit Burkhalter S 01/03/2014, 4:19 PM

## 2014-01-03 ENCOUNTER — Encounter: Payer: Medicare HMO | Admitting: Vascular Surgery

## 2014-01-03 ENCOUNTER — Inpatient Hospital Stay (HOSPITAL_COMMUNITY): Payer: Medicare Other | Admitting: Physical Therapy

## 2014-01-03 DIAGNOSIS — N472 Paraphimosis: Secondary | ICD-10-CM | POA: Diagnosis not present

## 2014-01-03 DIAGNOSIS — R040 Epistaxis: Secondary | ICD-10-CM | POA: Diagnosis present

## 2014-01-03 LAB — GLUCOSE, CAPILLARY: Glucose-Capillary: 80 mg/dL (ref 70–99)

## 2014-01-03 LAB — PROTIME-INR
INR: 3.12 — AB (ref 0.00–1.49)
PROTHROMBIN TIME: 31 s — AB (ref 11.6–15.2)

## 2014-01-03 MED ORDER — GUAIFENESIN ER 600 MG PO TB12: 600.0000 mg | ORAL_TABLET | Freq: Two times a day (BID) | ORAL | Status: AC

## 2014-01-03 MED ORDER — GLIMEPIRIDE 1 MG PO TABS: 1.0000 mg | ORAL_TABLET | Freq: Every day | ORAL | Status: AC

## 2014-01-03 MED ORDER — SALINE SPRAY 0.65 % NA SOLN: 4.0000 | Freq: Every day | NASAL | Status: AC

## 2014-01-03 MED ORDER — ADULT MULTIVITAMIN W/MINERALS CH: 1.0000 | ORAL_TABLET | Freq: Every day | ORAL | Status: AC

## 2014-01-03 MED ORDER — RANITIDINE HCL 150 MG PO TABS: 150.0000 mg | ORAL_TABLET | Freq: Two times a day (BID) | ORAL | Status: AC

## 2014-01-03 MED ORDER — DILTIAZEM HCL ER COATED BEADS 240 MG PO CP24: 240.0000 mg | ORAL_CAPSULE | Freq: Every day | ORAL | Status: AC

## 2014-01-03 MED ORDER — LINAGLIPTIN 5 MG PO TABS: 5.0000 mg | ORAL_TABLET | Freq: Every day | ORAL | Status: DC

## 2014-01-03 MED ORDER — FUROSEMIDE 80 MG PO TABS: 40.0000 mg | ORAL_TABLET | Freq: Every day | ORAL | Status: DC

## 2014-01-03 MED ORDER — CEPHALEXIN 250 MG PO CAPS: 250.0000 mg | ORAL_CAPSULE | Freq: Three times a day (TID) | ORAL | Status: DC

## 2014-01-03 MED ORDER — WARFARIN SODIUM 2 MG PO TABS: ORAL_TABLET | ORAL | Status: DC

## 2014-01-03 MED ORDER — WARFARIN SODIUM 3 MG PO TABS
3.0000 mg | ORAL_TABLET | Freq: Once | ORAL | Status: DC
  Filled 2014-01-03: qty 1

## 2014-01-03 MED ORDER — TRAMADOL-ACETAMINOPHEN 37.5-325 MG PO TABS: 1.0000 | ORAL_TABLET | Freq: Three times a day (TID) | ORAL | Status: DC | PRN

## 2014-01-03 NOTE — Progress Notes (Signed)
Physical Therapy Discharge Summary  Patient Details  Name: [name] [name] MRN: [id number] Date of Birth: 25-Nov-1931  Today's Date: 01/03/2014 Time: 6546-5035 Time Calculation (min): 41 min  AM session: Wife and son in law present for family education.  Upon entering a strong urine odor was noted.  Pt noted to be incontinent of urine and gown and bed soaked in urine.  Pt reports not calling and requesting assistance to transfer to toilet secondary to urgency of urination but also reports not alerting nursing staff to incontinence.  Wife reports that PTA pt would have incontinent episodes at home and she was able to assist him with hygiene and changing his clothes.  Pt transferred to EOB with min A and performed multiple sit <> stands from bed with min A to doff wet brief and don clean brief and clothing and to transfer to w/c stand pivot with RW min A.  Discussed with pt and wife the presence of wound on buttocks and risk for infection or more wounds if skin remains wet/moist from incontinence and importance of cleaning and drying area promptly after incontinence.  Wife reports that she had not undergone education about how to dress wound on buttocks.  Alerted RN that wife requires skin care education prior to D/C.  Also discussed with wife and pt importance of pressure relief during the day with static standing from w/c with UE support on table or leaning forward bringing elbows to knees to relieve pressure on sacrum while in w/c.  Pt and wife verbalized understanding.  Wife and pt also reporting that RW had not arrived yet for home.  Alerted Education officer, museum who reports that it was coming from a different company and would arrive late.  Transferred to gym in w/c total A.  Reviewed stair negotiation sequence with son in law and pt and son gave repeat demonstration up/down 4 stairs with L rail laterally but required +2 for safety secondary to pt inability to maintain RLE extension or advance LLE to next step  secondary to fatigue and LE weakness.  Recommending that son in law have second person available to assist if needed.  Also demonstrated to son in law proper positioning of LLE for sit <> stand to maximize pt effort/efficiency.  Pt and family verbalized understanding and did not have any concerns with going home despite pt continued need for increased physical assistance.    Patient has met 8 of 9 long term goals due to improved activity tolerance, improved balance, improved postural control, increased strength and functional use of  right lower extremity and left lower extremity.  Patient to discharge at a wheelchair level Supervision for household and community mobility with wife's min A for basic and car transfers and gait with RW for very short distances within the home secondary to increased falls risk.   Patient's care partner (wife) requires assistance of son in law or grandson to assist pt in/out of house for stair negotiation secondary to pt requiring increased physical assistance to perform safely and is independent to provide min A for basic and car transfers, gait short distances with RW to provide the necessary physical and supervision assistance at discharge.  Reasons goals not met: Stair goal: pt requires +2 for stairs for safety secondary to fatigue and pt LE weakness causing LE to give out during WB and requiring increased assistance to weight shift and advance LLE.  Son in law able to assist with stairs but recommending that second person be available to assist with  ascending the stairs into the house.  Son in law verbalized agreement.    Recommendation:  Patient will benefit from ongoing skilled PT services in home health setting to continue to advance safe functional mobility, address ongoing impairments in impaired activity tolerance and cardiorespiratory endurance, impaired strength, postural control, balance and balance reactions, gait and minimize fall  risk.  Equipment: 2WW  Reasons for discharge: treatment goals met and discharge from hospital  Patient/family agrees with progress made and goals achieved: Yes  PT Discharge  See Navigator dated 01/02/14  Vital Signs Therapy Vitals BP: 116/67 mmHg Pain  No reports of pain  See FIM for current functional status  Raylene Everts [facility] 01/03/2014, 12:07 PM

## 2014-01-03 NOTE — Progress Notes (Signed)
Subjective/Complaints: Pt without new issues,excited about d.c Review of Systems - Negative except occ cough Objective: Vital Signs: Blood pressure 97/55, pulse 78, temperature 96.7 F (35.9 C), temperature source Oral, resp. rate 18, height 5' 7" (1.702 m), weight 76.3 kg (168 lb 3.4 oz), SpO2 94.00%. No results found. Results for orders placed during the hospital encounter of 12/20/13 (from the past 72 hour(s))  GLUCOSE, CAPILLARY     Status: None   Collection Time    12/31/13  7:03 AM      Result Value Range   Glucose-Capillary 82  70 - 99 mg/dL   Comment 1 Notify RN    GLUCOSE, CAPILLARY     Status: Abnormal   Collection Time    12/31/13 11:26 AM      Result Value Range   Glucose-Capillary 173 (*) 70 - 99 mg/dL  GLUCOSE, CAPILLARY     Status: Abnormal   Collection Time    12/31/13  4:59 PM      Result Value Range   Glucose-Capillary 119 (*) 70 - 99 mg/dL  GLUCOSE, CAPILLARY     Status: Abnormal   Collection Time    12/31/13  9:10 PM      Result Value Range   Glucose-Capillary 100 (*) 70 - 99 mg/dL   Comment 1 Notify RN    PROTIME-INR     Status: Abnormal   Collection Time    01/01/14  3:39 AM      Result Value Range   Prothrombin Time 27.8 (*) 11.6 - 15.2 seconds   INR 2.71 (*) 0.00 - 0.25  BASIC METABOLIC PANEL     Status: Abnormal   Collection Time    01/01/14  3:39 AM      Result Value Range   Sodium 134 (*) 137 - 147 mEq/L   Potassium 4.2  3.7 - 5.3 mEq/L   Chloride 96  96 - 112 mEq/L   CO2 27  19 - 32 mEq/L   Glucose, Bld 62 (*) 70 - 99 mg/dL   BUN 28 (*) 6 - 23 mg/dL   Creatinine, Ser 1.23  0.50 - 1.35 mg/dL   Calcium 9.1  8.4 - 10.5 mg/dL   GFR calc non Af Amer 54 (*) >90 mL/min   GFR calc Af Amer 62 (*) >90 mL/min   Comment: (NOTE)     The eGFR has been calculated using the CKD EPI equation.     This calculation has not been validated in all clinical situations.     eGFR's persistently <90 mL/min signify possible Chronic Kidney     Disease.   GLUCOSE, CAPILLARY     Status: None   Collection Time    01/01/14  7:37 AM      Result Value Range   Glucose-Capillary 87  70 - 99 mg/dL   Comment 1 Notify RN    GLUCOSE, CAPILLARY     Status: Abnormal   Collection Time    01/01/14 11:15 AM      Result Value Range   Glucose-Capillary 159 (*) 70 - 99 mg/dL   Comment 1 Notify RN    GLUCOSE, CAPILLARY     Status: Abnormal   Collection Time    01/01/14  4:56 PM      Result Value Range   Glucose-Capillary 120 (*) 70 - 99 mg/dL  GLUCOSE, CAPILLARY     Status: Abnormal   Collection Time    01/01/14  9:16 PM      Result  Value Range   Glucose-Capillary 104 (*) 70 - 99 mg/dL  PROTIME-INR     Status: Abnormal   Collection Time    01/02/14  6:25 AM      Result Value Range   Prothrombin Time 30.5 (*) 11.6 - 15.2 seconds   INR 3.06 (*) 0.00 - 1.49  GLUCOSE, CAPILLARY     Status: None   Collection Time    01/02/14  7:23 AM      Result Value Range   Glucose-Capillary 78  70 - 99 mg/dL   Comment 1 Notify RN    GLUCOSE, CAPILLARY     Status: Abnormal   Collection Time    01/02/14 11:12 AM      Result Value Range   Glucose-Capillary 144 (*) 70 - 99 mg/dL   Comment 1 Notify RN    GLUCOSE, CAPILLARY     Status: Abnormal   Collection Time    01/02/14  4:34 PM      Result Value Range   Glucose-Capillary 135 (*) 70 - 99 mg/dL  GLUCOSE, CAPILLARY     Status: Abnormal   Collection Time    01/02/14 10:22 PM      Result Value Range   Glucose-Capillary 118 (*) 70 - 99 mg/dL     HEENT: normal, no evidence of epistaxis Cardio: irregular and no murmurs, rate controlled Resp: CTA B/L and unlabored GI: BS positive and non distended Extremity:  Pulses absent Bilat pedal and R pop and No Edema Skin:   Intact but dry Neuro: Alert/Oriented, Cranial Nerve Abnormalities L central 7, Normal Sensory, Abnormal Motor 3/5 L deltoid, bi, tri, grip,L quad, HF 2- ankle DF,  and Abnormal FMC Ataxic/ dec FMC Musc/Skel:  Other no pain with UE and LE  ROM Gen NAD   Assessment/Plan: 1. Functional deficits secondary to Deconditioning respiratory and renal failure as well as chronic Left Hemiparesis from CVA 64yr ago which require 3+ hours per day of interdisciplinary therapy in a comprehensive inpatient rehab setting. Physiatrist is providing close team supervision and 24 hour management of active medical problems listed below. Physiatrist and rehab team continue to assess barriers to discharge/monitor patient progress toward functional and medical goals. Stable for D/C today F/u PCP in 1-2 weeks F/u cardiology 3 weeks See D/C summary See D/C instructions FIM: FIM - Bathing Bathing Steps Patient Completed: Chest;Right Arm;Left Arm;Abdomen;Right upper leg;Left upper leg;Front perineal area;Buttocks;Right lower leg (including foot);Left lower leg (including foot) Bathing: 4: Steadying assist  FIM - Upper Body Dressing/Undressing Upper body dressing/undressing steps patient completed: Thread/unthread right sleeve of pullover shirt/dresss;Thread/unthread left sleeve of pullover shirt/dress;Put head through opening of pull over shirt/dress;Pull shirt over trunk Upper body dressing/undressing: 5: Set-up assist to: Obtain clothing/put away FIM - Lower Body Dressing/Undressing Lower body dressing/undressing steps patient completed: Thread/unthread right underwear leg;Thread/unthread left underwear leg;Thread/unthread right pants leg;Thread/unthread left pants leg;Don/Doff right sock;Don/Doff left sock;Don/Doff right shoe;Fasten/unfasten right shoe;Fasten/unfasten left shoe Lower body dressing/undressing: 4: Min-Patient completed 75 plus % of tasks  FIM - Toileting Toileting steps completed by patient: Adjust clothing prior to toileting;Performs perineal hygiene Toileting Assistive Devices: Grab bar or rail for support Toileting: 3: Mod-Patient completed 2 of 3 steps  FIM - TRadio producerDevices: Bedside  commode;Grab bars Toilet Transfers: 4-To toilet/BSC: Min A (steadying Pt. > 75%);4-From toilet/BSC: Min A (steadying Pt. > 75%)  FIM - Bed/Chair Transfer Bed/Chair Transfer Assistive Devices: WCopy 5: Supine > Sit: Supervision (verbal cues/safety issues);4: Sit > Supine:  Min A (steadying pt. > 75%/lift 1 leg);4: Bed > Chair or W/C: Min A (steadying Pt. > 75%);4: Chair or W/C > Bed: Min A (steadying Pt. > 75%)  FIM - Locomotion: Wheelchair Distance: 40 Locomotion: Wheelchair: 1: Travels less than 50 ft with supervision, cueing or coaxing FIM - Locomotion: Ambulation Locomotion: Ambulation Assistive Devices: Administrator Ambulation/Gait Assistance: 4: Min assist Locomotion: Ambulation: 1: Travels less than 50 ft with minimal assistance (Pt.>75%)  Comprehension Comprehension Mode: Auditory Comprehension: 5-Follows basic conversation/direction: With extra time/assistive device  Expression Expression Mode: Verbal Expression: 5-Expresses basic needs/ideas: With extra time/assistive device  Social Interaction Social Interaction: 6-Interacts appropriately with others with medication or extra time (anti-anxiety, antidepressant).  Problem Solving Problem Solving: 5-Solves basic 90% of the time/requires cueing < 10% of the time  Memory Memory: 5-Recognizes or recalls 90% of the time/requires cueing < 10% of the time  Medical Problem List and Plan:  Deconditioning related acute renal failure, hypoxia, and multiple medical issues  1. RLE DVT/A fib/Anticoagulation: Pharmaceutical: Coumadin per pharm protocol 2. Pain Management: prn tylenol    3. Mood: Will have LCSW follow for evaluation.  4. Neuropsych: This patient is capable of making decisions on his own behalf.  5. Epistaxis:resolved                                                                   6. Cough resolved off abx today 7. AECOPD: Respiratory status improving and slow taper of steroids. Continue nebs  and IS.  8. Acute on chronic CHF: Compensated--continue low salt diet with 1500 cc fluid restriction.Lasix held per cardiology daily weights 1kg down at 76kg. Daily monitoring of I's and O's    9. A fib: monitor HR with bid checks. Continue diltiazem 240 mg daily.  10. GU: Foley, s/p paraphimosis reduction per GU,UA -,  voiding trial in progress 11. DM improving , glimipiride and linagliptin 12.  Orthostatic hypotension, multifactorial, meds, mild dehydration, off flomax, asymptomatic LOS (Days) 14 A FACE TO FACE EVALUATION WAS PERFORMED  , E 01/03/2014, 7:00 AM

## 2014-01-03 NOTE — Progress Notes (Signed)
Coumadin per pharmacy  Assessment: [age] YOM admitted with PNA treated with ABX, HFpEF with volume overload and Afib with CVR on diltiazem. He was continued on warfarin for afib but developed epistaxis - his nose was packed by ENT and warfarin was held. Epistaxis resolved, INR 1.53 and new RLE DVT found 1/4. Decision was made to restart anticoagulation and monitior closely.   AC: Warfarin for afib and new RLE DVT, INR 3.12 from 2.54, finished levaquin (ends 1/15); No bleeding, h/o epistaxis 1/2-resolved s/p cauterization/packing Packing removed 1/8, no further epistaxis  PTA 7.5mg  daily - previously on amio - stopped day of last d/c 12/05/13  INR goal 2-3  Plan:  Will try coumadin 3mg  po x1 Continue daily INR

## 2014-01-03 NOTE — Progress Notes (Signed)
Social Work Patient ID: [name] GoodyBobby Frederic, male   DOB: 16-Mar-1931, 80 y.o.   MRN: [id number] Requested Ed-RN to educate pt, wife and son on skin issues and breakdown on his bottom.  Will also have HHRN follow up with this and reinforce teaching.

## 2014-01-03 NOTE — Progress Notes (Signed)
Social Work Discharge Note Discharge Note  The overall goal for the admission was met for:   Discharge location: [facility] PROVIDE 24 HR CARE  Length of Stay: Yes-14 DAYS  Discharge activity level: Yes-MOD/I W/C AND SUPERVISION/MIN AMBULATION  Home/community participation: Yes  Services provided included: MD, RD, PT, OT, RN, CM, Pharmacy and SW  Financial Services: Private Insurance: [employer]  Follow-up services arranged: Home Health: [facility], DME: Deeann Cree and Patient/Family has no preference for HH/DME agencies  Comments (or additional information):FAMILY EDUCATION COMPLETED AND WIFE FEELS COMFORTABLE WITH HIS CARE.  PT VERY EXCITED TO GO HOME AFTER LONG HOSPITALIZATION  Patient/Family verbalized understanding of follow-up arrangements: Yes  Individual responsible for coordination of the follow-up plan: SELF & MARLENE-WIFE  Confirmed correct DME delivered: Elease Hashimoto 01/03/2014    Elease Hashimoto

## 2014-01-03 NOTE — Discharge Instructions (Signed)
Inpatient Rehab Discharge Instructions  [name] [name] Discharge date and time:  01/03/14  Activities/Precautions/ Functional Status: Activity: activity as tolerated at wheelchair level. Limit walking to transfers and to go to the bathroom due to endurance issues. Home therapist will clear for walking household distances.  Diet: cardiac diet Low salt. 1500 cc/day Wound Care: none needed  Functional status:  ___ No restrictions     ___ Walk up steps independently _X__ 24/7 supervision/assistance   ___ Walk up steps with assistance ___ Intermittent supervision/assistance  ___ Bathe/dress independently _X__ Walk with walker    _X__ Bathe/dress with assistance ___ Walk Independently    ___ Shower independently _X__ Walk with assistance    ___ Shower with assistance _X__ No alcohol     ___ Return to work/school ________  Special Instructions: 1. Use zinc oxide cream to buttocks twice a day.  2.  Go to bathroom to urinate every 4 hours while awake. 3. Coumadin level draw on Friday---Dr. Uppin's office will call regarding dosing of medication.   COMMUNITY REFERRALS UPON DISCHARGE:    Home Health:   PT, OT, RN    Agency:[facility] Phone:[phone] Date of last service:01/03/2014   Medical Equipment/Items Ordered:ROLLING WALKER  Agency/Supplier; [facility]   GENERAL COMMUNITY RESOURCES FOR PATIENT/FAMILY: Support Groups:COPD/CHF SUPPORT GROUP     Heart Failure Heart failure is a condition in which the heart has trouble pumping blood. This means your heart does not pump blood efficiently for your body to work well. In some cases of heart failure, fluid may back up into your lungs or you may have swelling (edema) in your lower legs. Heart failure is usually a long-term (chronic) condition. It is important for you to take good care of yourself and follow your caregiver's treatment plan. CAUSES  Some health conditions can cause heart failure. Those health conditions include:  High  blood pressure (hypertension) causes the heart muscle to work harder than normal. When pressure in the blood vessels is high, the heart needs to pump (contract) with more force in order to circulate blood throughout the body. High blood pressure eventually causes the heart to become stiff and weak.  Coronary artery disease (CAD) is the buildup of cholesterol and fat (plaque) in the arteries of the heart. The blockage in the arteries deprives the heart muscle of oxygen and blood. This can cause chest pain and may lead to a heart attack. High blood pressure can also contribute to CAD.  Heart attack (myocardial infarction) occurs when 1 or more arteries in the heart become blocked. The loss of oxygen damages the muscle tissue of the heart. When this happens, part of the heart muscle dies. The injured tissue does not contract as well and weakens the heart's ability to pump blood.  Abnormal heart valves can cause heart failure when the heart valves do not open and close properly. This makes the heart muscle pump harder to keep the blood flowing.  Heart muscle disease (cardiomyopathy or myocarditis) is damage to the heart muscle from a variety of causes. These can include drug or alcohol abuse, infections, or unknown reasons. These can increase the risk of heart failure.  Lung disease makes the heart work harder because the lungs do not work properly. This can cause a strain on the heart, leading it to fail.  Diabetes increases the risk of heart failure. High blood sugar contributes to high fat (lipid) levels in the blood. Diabetes can also cause slow damage to tiny blood vessels that carry important  nutrients to the heart muscle. When the heart does not get enough oxygen and food, it can cause the heart to become weak and stiff. This leads to a heart that does not contract efficiently.  Other conditions can contribute to heart failure. These include abnormal heart rhythms, thyroid problems, and low blood  counts (anemia). Certain unhealthy behaviors can increase the risk of heart failure. Those unhealthy behaviors include:  Being overweight.  Smoking or chewing tobacco.  Eating foods high in fat and cholesterol.  Abusing illicit drugs or alcohol.  Lacking physical activity. SYMPTOMS  Heart failure symptoms may vary and can be hard to detect. Symptoms may include:  Shortness of breath with activity, such as climbing stairs.  Persistent cough.  Swelling of the feet, ankles, legs, or abdomen.  Unexplained weight gain.  Difficulty breathing when lying flat (orthopnea).  Waking from sleep because of the need to sit up and get more air.  Rapid heartbeat.  Fatigue and loss of energy.  Feeling lightheaded, dizzy, or close to fainting.  Loss of appetite.  Nausea.  Increased urination during the night (nocturia). DIAGNOSIS  A diagnosis of heart failure is based on your history, symptoms, physical examination, and diagnostic tests. Diagnostic tests for heart failure may include:  Echocardiography.  Electrocardiography.  Chest X-ray.  Blood tests.  Exercise stress test.  Cardiac angiography.  Radionuclide scans. TREATMENT  Treatment is aimed at managing the symptoms of heart failure. Medicines, behavioral changes, or surgical intervention may be necessary to treat heart failure.  Medicines to help treat heart failure may include:  Angiotensin-converting enzyme (ACE) inhibitors. This type of medicine blocks the effects of a blood protein called angiotensin-converting enzyme. ACE inhibitors relax (dilate) the blood vessels and help lower blood pressure.  Angiotensin receptor blockers. This type of medicine blocks the actions of a blood protein called angiotensin. Angiotensin receptor blockers dilate the blood vessels and help lower blood pressure.  Water pills (diuretics). Diuretics cause the kidneys to remove salt and water from the blood. The extra fluid is removed  through urination. This loss of extra fluid lowers the volume of blood the heart pumps.  Beta blockers. These prevent the heart from beating too fast and improve heart muscle strength.  Digitalis. This increases the force of the heartbeat.  Healthy behavior changes include:  Obtaining and maintaining a healthy weight.  Stopping smoking or chewing tobacco.  Eating heart healthy foods.  Limiting or avoiding alcohol.  Stopping illicit drug use.  Physical activity as directed by your caregiver.  Surgical treatment for heart failure may include:  A procedure to open blocked arteries, repair damaged heart valves, or remove damaged heart muscle tissue.  A pacemaker to improve heart muscle function and control certain abnormal heart rhythms.  An internal cardioverter defibrillator to treat certain serious abnormal heart rhythms.  A left ventricular assist device to assist the pumping ability of the heart. HOME CARE INSTRUCTIONS   Take your medicine as directed by your caregiver. Medicines are important in reducing the workload of your heart, slowing the progression of heart failure, and improving your symptoms.  Do not stop taking your medicine unless directed by your caregiver.  Do not skip any dose of medicine.  Refill your prescriptions before you run out of medicine. Your medicines are needed every day.  Take over-the-counter medicine only as directed by your caregiver or pharmacist.  Engage in moderate physical activity if directed by your caregiver. Moderate physical activity can benefit some people. The elderly and people  with severe heart failure should consult with a caregiver for physical activity recommendations.  Eat heart healthy foods. Food choices should be free of trans fat and low in saturated fat, cholesterol, and salt (sodium). Healthy choices include fresh or frozen fruits and vegetables, fish, lean meats, legumes, fat-free or low-fat dairy products, and whole  grain or high fiber foods. Talk to a dietitian to learn more about heart healthy foods.  Limit sodium if directed by your caregiver. Sodium restriction may reduce symptoms of heart failure in some people. Talk to a dietitian to learn more about heart healthy seasonings.  Use healthy cooking methods. Healthy cooking methods include roasting, grilling, broiling, baking, poaching, steaming, or stir-frying. Talk to a dietitian to learn more about healthy cooking methods.  Limit fluids if directed by your caregiver. Fluid restriction may reduce symptoms of heart failure in some people.  Weigh yourself every day. Daily weights are important in the early recognition of excess fluid. You should weigh yourself every morning after you urinate and before you eat breakfast. Wear the same amount of clothing each time you weigh yourself. Record your daily weight. Provide your caregiver with your weight record.  Monitor and record your blood pressure if directed by your caregiver.  Check your pulse if directed by your caregiver.  Lose weight if directed by your caregiver. Weight loss may reduce symptoms of heart failure in some people.  Stop smoking or chewing tobacco. Nicotine makes your heart work harder by causing your blood vessels to constrict. Do not use nicotine gum or patches before talking to your caregiver.  Schedule and attend follow-up visits as directed by your caregiver. It is important to keep all your appointments.  Limit alcohol intake to no more than 1 drink per day for nonpregnant women and 2 drinks per day for men. Drinking more than that is harmful to your heart. Tell your caregiver if you drink alcohol several times a week. Talk with your caregiver about whether alcohol is safe for you. If your heart has already been damaged by alcohol or you have severe heart failure, drinking alcohol should be stopped completely.  Stop illicit drug use.  Stay up-to-date with immunizations. It is  especially important to prevent respiratory infections through current pneumococcal and influenza immunizations.  Manage other health conditions such as hypertension, diabetes, thyroid disease, or abnormal heart rhythms as directed by your caregiver.  Learn to manage stress.  Plan rest periods when fatigued.  Learn strategies to manage high temperatures. If the weather is extremely hot:  Avoid vigorous physical activity.  Use air conditioning or fans or seek a cooler location.  Avoid caffeine and alcohol.  Wear loose-fitting, lightweight, and light-colored clothing.  Learn strategies to manage cold temperatures. If the weather is extremely cold:  Avoid vigorous physical activity.  Layer clothes.  Wear mittens or gloves, a hat, and a scarf when going outside.  Avoid alcohol.  Obtain ongoing education and support as needed.  Participate or seek rehabilitation as needed to maintain or improve independence and quality of life. SEEK MEDICAL CARE IF:   Your weight increases by 03 lb/1.4 kg in 1 day or 05 lb/2.3 kg in a week.  You have increasing shortness of breath that is unusual for you.  You are unable to participate in your usual physical activities.  You tire easily.  You cough more than normal, especially with physical activity.  You have any or more swelling in areas such as your hands, feet, ankles, or abdomen.  You are unable to sleep because it is hard to breathe.  You feel like your heart is beating fast (palpitations).  You become dizzy or lightheaded upon standing up. SEEK IMMEDIATE MEDICAL CARE IF:   You have difficulty breathing.  There is a change in mental status such as decreased alertness or difficulty with concentration.  You have a pain or discomfort in your chest.  You have an episode of fainting (syncope). MAKE SURE YOU:   Understand these instructions.  Will watch your condition.  Will get help right away if you are not doing well or  get worse. Document Released: 11/30/2005 Document Revised: 03/27/2013 Document Reviewed: 12/22/2012 [facility] Patient Information 2014 [place], [place].  My questions have been answered and I understand these instructions. I will adhere to these goals and the provided educational materials after my discharge from the hospital.  Patient/Caregiver Signature _______________________________ Date __________  Clinician Signature _______________________________________ Date __________  Please bring this form and your medication list with you to all your follow-up doctor's appointments.

## 2014-01-03 NOTE — Progress Notes (Signed)
Pt discharged to home, accompanied by his wife and son. 

## 2014-01-09 ENCOUNTER — Encounter (HOSPITAL_COMMUNITY): Payer: Self-pay

## 2014-01-09 ENCOUNTER — Encounter: Payer: Self-pay | Admitting: Physical Medicine and Rehabilitation

## 2014-01-09 ENCOUNTER — Ambulatory Visit (HOSPITAL_COMMUNITY)
Admission: RE | Admit: 2014-01-09 | Discharge: 2014-01-09 | Disposition: A | Discharge status: Home / Self Care | Payer: Medicare Other | Source: Ambulatory Visit | Attending: Internal Medicine | Admitting: Internal Medicine

## 2014-01-09 VITALS — BP 111/57 | HR 60 | Resp 18 | Wt 177.2 lb

## 2014-01-09 DIAGNOSIS — I4891 Unspecified atrial fibrillation: Secondary | ICD-10-CM

## 2014-01-09 DIAGNOSIS — I2510 Atherosclerotic heart disease of native coronary artery without angina pectoris: Secondary | ICD-10-CM | POA: Insufficient documentation

## 2014-01-09 DIAGNOSIS — J449 Chronic obstructive pulmonary disease, unspecified: Secondary | ICD-10-CM | POA: Insufficient documentation

## 2014-01-09 DIAGNOSIS — Z86718 Personal history of other venous thrombosis and embolism: Secondary | ICD-10-CM | POA: Insufficient documentation

## 2014-01-09 DIAGNOSIS — K219 Gastro-esophageal reflux disease without esophagitis: Secondary | ICD-10-CM | POA: Insufficient documentation

## 2014-01-09 DIAGNOSIS — I509 Heart failure, unspecified: Secondary | ICD-10-CM | POA: Insufficient documentation

## 2014-01-09 DIAGNOSIS — Z8673 Personal history of transient ischemic attack (TIA), and cerebral infarction without residual deficits: Secondary | ICD-10-CM | POA: Insufficient documentation

## 2014-01-09 DIAGNOSIS — I82409 Acute embolism and thrombosis of unspecified deep veins of unspecified lower extremity: Secondary | ICD-10-CM

## 2014-01-09 DIAGNOSIS — N183 Chronic kidney disease, stage 3 unspecified: Secondary | ICD-10-CM | POA: Insufficient documentation

## 2014-01-09 DIAGNOSIS — I129 Hypertensive chronic kidney disease with stage 1 through stage 4 chronic kidney disease, or unspecified chronic kidney disease: Secondary | ICD-10-CM | POA: Insufficient documentation

## 2014-01-09 DIAGNOSIS — I5032 Chronic diastolic (congestive) heart failure: Secondary | ICD-10-CM

## 2014-01-09 DIAGNOSIS — Z79899 Other long term (current) drug therapy: Secondary | ICD-10-CM | POA: Insufficient documentation

## 2014-01-09 DIAGNOSIS — I10 Essential (primary) hypertension: Secondary | ICD-10-CM

## 2014-01-09 DIAGNOSIS — I739 Peripheral vascular disease, unspecified: Secondary | ICD-10-CM | POA: Insufficient documentation

## 2014-01-09 DIAGNOSIS — N179 Acute kidney failure, unspecified: Secondary | ICD-10-CM

## 2014-01-09 DIAGNOSIS — J4489 Other specified chronic obstructive pulmonary disease: Secondary | ICD-10-CM | POA: Insufficient documentation

## 2014-01-09 DIAGNOSIS — E119 Type 2 diabetes mellitus without complications: Secondary | ICD-10-CM | POA: Insufficient documentation

## 2014-01-09 HISTORY — DX: Chronic kidney disease, stage 3 unspecified: N18.30

## 2014-01-09 HISTORY — DX: Atherosclerotic heart disease of native coronary artery without angina pectoris: I25.10

## 2014-01-09 HISTORY — DX: Peripheral vascular disease, unspecified: I73.9

## 2014-01-09 HISTORY — DX: Acute embolism and thrombosis of unspecified deep veins of unspecified lower extremity: I82.409

## 2014-01-09 HISTORY — DX: Chronic kidney disease, stage 3 (moderate): N18.3

## 2014-01-09 HISTORY — DX: Chronic diastolic (congestive) heart failure: I50.32

## 2014-01-09 LAB — BASIC METABOLIC PANEL
BUN: 15 mg/dL (ref 6–23)
CO2: 26 mEq/L (ref 19–32)
Calcium: 8.9 mg/dL (ref 8.4–10.5)
Chloride: 98 mEq/L (ref 96–112)
Creatinine, Ser: 0.95 mg/dL (ref 0.50–1.35)
GFR calc Af Amer: 89 mL/min — ABNORMAL LOW (ref >90)
GFR calc non Af Amer: 77 mL/min — ABNORMAL LOW (ref >90)
Glucose, Bld: 119 mg/dL — ABNORMAL HIGH (ref 70–99)
Potassium: 3.9 mEq/L (ref 3.7–5.3)
Sodium: 137 mEq/L (ref 137–147)

## 2014-01-09 NOTE — Patient Instructions (Signed)
Take an extra 40 mg (1 tablet) of lasix today.  Check labs.  Follow up in 1 month.  Call any issues. [phone]  Do the following things EVERYDAY: 1) Weigh yourself in the morning before breakfast. Write it down and keep it in a log. 2) Take your medicines as prescribed 3) Eat low salt foods-Limit salt (sodium) to 2000 mg per day.  4) Stay as active as you can everyday 5) Limit all fluids for the day to less than 2 liters

## 2014-01-09 NOTE — Progress Notes (Addendum)
Patient ID: [name] [name], male   DOB: 1931/07/16, 80 y.o.   MRN: [id number]  Referring Physician Dr. Waymon Amato  Primary Physician: Dr. Mathis Bud Primary Cardiologist Dr. Patty Sermons  HPI: [name] is a 80 year old male with history of chronic atrial fibrillation, CAD, PVD, COPD, HTN, DM, CKD stage III, CVA, chronic diastolic heart failure and lower extremity embolic event likely from atrial fibrillation s/p R femoral embolectomy (11/2013)  Admitted 12/10/2013-01/03/14 for A/C respiratory failure in the setting of Afib RVR, and a/c diastolic HF. During stay found to have R DVT, could not afford NOAC so continued on coumadin. Had quite a bit of epistaxis which ENT saw and packed. Was transferred to Inpatient Rehab for PT/OT. Discharge weight 168 lbs.   Post Hosptial Follow up: Just discharged after long hospital admission which required inpatient rehab. He was admitted for a/c respiratory failure in the setting of acute bronchitis and a/c diastolic HF. Doing well. Went home last Wednesday from Inpatient Rehab, discharge weight 168 lbs.  Denies CP, PND, edema or SOB. +orthopnea. Starting PT this week. Weight at home 173 lbs. Follows a low salt diet and drinking less than 2L a day.    ROS: All systems negative except as listed in HPI, PMH and Problem List.  Past Medical History  Diagnosis Date  . Hypertension   . Stroke   . COPD (chronic obstructive pulmonary disease)   . Diabetes mellitus without complication   . GERD (gastroesophageal reflux disease)   . A-fib     Current Outpatient Prescriptions  Medication Sig Dispense Refill  . atorvastatin (LIPITOR) 10 MG tablet Take 10 mg by mouth daily.      . cephALEXin (KEFLEX) 250 MG capsule Take 1 capsule (250 mg total) by mouth 3 (three) times daily.  21 capsule  0  . diltiazem (CARDIZEM CD) 240 MG 24 hr capsule Take 1 capsule (240 mg total) by mouth daily.  30 capsule  1  . furosemide (LASIX) 80 MG tablet Take 0.5 tablets (40 mg total) by mouth daily.   30 tablet  1  . glimepiride (AMARYL) 1 MG tablet Take 1 tablet (1 mg total) by mouth daily with breakfast.  30 tablet  1  . guaiFENesin (MUCINEX) 600 MG 12 hr tablet Take 1 tablet (600 mg total) by mouth 2 (two) times daily.  30 tablet  1  . levalbuterol (XOPENEX) 0.63 MG/3ML nebulizer solution Take 3 mLs (0.63 mg total) by nebulization every 6 (six) hours as needed for wheezing or shortness of breath.      . linagliptin (TRADJENTA) 5 MG TABS tablet Take 1 tablet (5 mg total) by mouth daily.  30 tablet  1  . Multiple Vitamin (MULTIVITAMIN WITH MINERALS) TABS tablet Take 1 tablet by mouth daily.      . ranitidine (ZANTAC) 150 MG tablet Take 1 tablet (150 mg total) by mouth 2 (two) times daily.  60 tablet  1  . sodium chloride (OCEAN) 0.65 % SOLN nasal spray Place 4 sprays into both nostrils 6 (six) times daily.    0  . traMADol-acetaminophen (ULTRACET) 37.5-325 MG per tablet Take 1 tablet by mouth every 8 (eight) hours as needed for moderate pain or severe pain.  15 tablet  0  . warfarin (COUMADIN) 2 MG tablet Do not take any coumadin today.  Take two pills with supper starting 01/04/14.  60 tablet  1   No current facility-administered medications for this encounter.    Filed Vitals:   01/09/14 1452  BP:  111/57  Pulse: 60  Resp: 18  Weight: 177 lb 4 oz (80.4 kg)  SpO2: 96%    PHYSICAL EXAM: General:  Chronically ill appearing. No resp difficulty; wife present; in wheelchair HEENT: normal Neck: supple. JVP 8. Carotids 2+ bilaterally; no bruits. No lymphadenopathy or thryomegaly appreciated. Cor: PMI normal. Regular rate & irregular rhythm. No rubs, gallops or murmurs. Lungs: clear, decreased in the bases Abdomen: soft, nontender, nondistended. No hepatosplenomegaly. No bruits or masses. Good bowel sounds. Extremities: no cyanosis, clubbing, rash, trace LE edema; frail skin Neuro: alert & orientedx3, cranial nerves grossly intact. Moves all 4 extremities w/o difficulty. Affect  pleasant.  ASSESSMENT & PLAN:  1) Chronic diastolic HF:  - Reviewed discharge summary from recent hospitalization. Weight is up about 9 lbs since discahrge and volume status mildly elevated. Will have the patient take an extra 40 mg of lasix today and then go back to 40 mg daily. Currently NYHA II-III symptoms. He is not very active d/t residuals from CVA. Awaiting PT to start this week. - SBP controlled. - BMET and pro-BNP today. - Reinforced the need and importance of daily weights, a low sodium diet, and fluid restriction (less than 2 L a day). Instructed to call the HF clinic if weight increases more than 3 lbs overnight or 5 lbs in a week. Provided with weight sheets to track weights. 2) Chronic afib - Chronic, rate controlled. Continue Cardizem CD and Coumadin. No s/s of bleeding. INR to be managed by PCP Dr. Mathis BudUppin 3) R DVT (12/2013) - continue coumadin 4) HTN -controlled on current medications.  5) Chronic kidney failure, stage III - Baseline Cr 1.4-1.6. Will check BMET today.  F/U 1 month. Next visit needs to make sure following up with Dr. Patty SermonsBrackbill for CAD and PAD.  Ulla Pot[place]grove, Dyani Babel B NP-C 4:57 PM

## 2014-01-15 DIAGNOSIS — I509 Heart failure, unspecified: Secondary | ICD-10-CM

## 2014-01-15 DIAGNOSIS — I739 Peripheral vascular disease, unspecified: Secondary | ICD-10-CM

## 2014-01-15 DIAGNOSIS — I4891 Unspecified atrial fibrillation: Secondary | ICD-10-CM

## 2014-01-15 DIAGNOSIS — J449 Chronic obstructive pulmonary disease, unspecified: Secondary | ICD-10-CM

## 2014-01-16 ENCOUNTER — Encounter: Payer: Self-pay | Admitting: Vascular Surgery

## 2014-01-17 ENCOUNTER — Ambulatory Visit (INDEPENDENT_AMBULATORY_CARE_PROVIDER_SITE_OTHER): Payer: Medicare Other | Admitting: Vascular Surgery

## 2014-01-17 ENCOUNTER — Encounter: Payer: Self-pay | Admitting: Vascular Surgery

## 2014-01-17 VITALS — BP 102/53 | HR 71 | Ht 67.0 in | Wt 177.0 lb

## 2014-01-17 DIAGNOSIS — I739 Peripheral vascular disease, unspecified: Secondary | ICD-10-CM

## 2014-01-17 NOTE — Addendum Note (Signed)
Addended by: Sharee PimpleMCCHESNEY, MARILYN K on: 01/17/2014 05:36 PM   Modules accepted: Orders

## 2014-01-17 NOTE — Progress Notes (Signed)
   Patient name: [name] GoodyBobby List MRN: [id number] DOB: Feb 03, 1931 Sex: male  REASON FOR VISIT: follow up after right femoral embolectomy  HPI: [name] [name] is a 80 y.o. male who developed the sudden onset of paralysis of his right lower extremity. A subsequent duplex scan showed no arterial flow in the right lower extremity and he was transferred to our hospital for urgent vascular evaluation. He underwent right femoral embolectomy and bovine pericardial patch angioplasty on 11/23/2013. He comes in for a one-month follow up visit.  He denies any significant leg pain. He has been getting home health physical therapy. Apparently after his discharge she was readmitted with some hypoglycemia.  REVIEW OF SYSTEMS: Arly.Keller[X ] denotes positive finding; [  ] denotes negative finding  CARDIOVASCULAR:  [ ]  chest pain   [ ]  dyspnea on exertion    CONSTITUTIONAL:  [ ]  fever   [ ]  chills  PHYSICAL EXAM: Filed Vitals:   01/17/14 1345  BP: 102/53  Pulse: 71  Height: 5\' 7"  (1.702 m)  Weight: 177 lb (80.287 kg)  SpO2: 97%   Body mass index is 27.72 kg/(m^2). GENERAL: The patient is a well-nourished male, in no acute distress. The vital signs are documented above. CARDIOVASCULAR: There is a regular rate and rhythm. He has palpable femoral pulses. PULMONARY: There is good air exchange bilaterally without wheezing or rales. His right groin incision is healed nicely. Both feet appear adequately perfused. The left foot is slightly cooler than the right.  MEDICAL ISSUES: Peripheral vascular disease The patient is doing well status post right femoral embolectomy. He is on Coumadin. He is not a smoker. I've encouraged him to stay as active as possible. I've ordered follow up ABIs in 6 months and I'll see him back at that time. He knows to call sooner if he has problems.   Return in about 6 months (around 07/17/2014).  Granite Godman S [facility] Beeper: [phone]

## 2014-01-17 NOTE — Assessment & Plan Note (Signed)
The patient is doing well status post right femoral embolectomy. He is on Coumadin. He is not a smoker. I've encouraged him to stay as active as possible. I've ordered follow up ABIs in 6 months and I'll see him back at that time. He knows to call sooner if he has problems.

## 2014-01-26 ENCOUNTER — Telehealth: Payer: Self-pay

## 2014-01-26 NOTE — Telephone Encounter (Signed)
[facility] to extend PT for 2 more weeks. Is this okay?

## 2014-01-26 NOTE — Telephone Encounter (Signed)
OK 

## 2014-01-31 ENCOUNTER — Telehealth: Payer: Self-pay

## 2014-01-31 NOTE — Telephone Encounter (Signed)
[name] [name] called toe get verbal order to extend home health physical therapy.

## 2014-01-31 NOTE — Telephone Encounter (Signed)
[facility] and gave [facility] to extend PT on 2/18.

## 2014-01-31 NOTE — Telephone Encounter (Signed)
Contacted Alan @ [facility] to give him the verbal okay to extend patient's PT per Dr. Jodean LimaKirstein's.

## 2014-02-07 ENCOUNTER — Encounter (HOSPITAL_COMMUNITY): Payer: Self-pay

## 2014-02-08 ENCOUNTER — Encounter (HOSPITAL_COMMUNITY): Payer: Medicare Other

## 2014-02-14 ENCOUNTER — Inpatient Hospital Stay (HOSPITAL_COMMUNITY)
Admission: EM | Admit: 2014-02-14 | Discharge: 2014-02-20 | DRG: 293 | Disposition: A | Discharge status: Skilled Nursing Facility | Payer: Medicare Other | Attending: Cardiology | Admitting: Cardiology

## 2014-02-14 ENCOUNTER — Emergency Department (HOSPITAL_COMMUNITY): Payer: Medicare Other

## 2014-02-14 ENCOUNTER — Encounter (HOSPITAL_COMMUNITY): Payer: Self-pay | Admitting: Emergency Medicine

## 2014-02-14 DIAGNOSIS — R601 Generalized edema: Secondary | ICD-10-CM

## 2014-02-14 DIAGNOSIS — Z9981 Dependence on supplemental oxygen: Secondary | ICD-10-CM

## 2014-02-14 DIAGNOSIS — Z79899 Other long term (current) drug therapy: Secondary | ICD-10-CM

## 2014-02-14 DIAGNOSIS — I4891 Unspecified atrial fibrillation: Secondary | ICD-10-CM

## 2014-02-14 DIAGNOSIS — E118 Type 2 diabetes mellitus with unspecified complications: Secondary | ICD-10-CM | POA: Diagnosis present

## 2014-02-14 DIAGNOSIS — Z87891 Personal history of nicotine dependence: Secondary | ICD-10-CM

## 2014-02-14 DIAGNOSIS — J4489 Other specified chronic obstructive pulmonary disease: Secondary | ICD-10-CM | POA: Diagnosis present

## 2014-02-14 DIAGNOSIS — N183 Chronic kidney disease, stage 3 unspecified: Secondary | ICD-10-CM | POA: Diagnosis present

## 2014-02-14 DIAGNOSIS — E876 Hypokalemia: Secondary | ICD-10-CM | POA: Diagnosis present

## 2014-02-14 DIAGNOSIS — J9601 Acute respiratory failure with hypoxia: Secondary | ICD-10-CM

## 2014-02-14 DIAGNOSIS — I129 Hypertensive chronic kidney disease with stage 1 through stage 4 chronic kidney disease, or unspecified chronic kidney disease: Secondary | ICD-10-CM | POA: Diagnosis present

## 2014-02-14 DIAGNOSIS — E11649 Type 2 diabetes mellitus with hypoglycemia without coma: Secondary | ICD-10-CM

## 2014-02-14 DIAGNOSIS — I4821 Permanent atrial fibrillation: Secondary | ICD-10-CM | POA: Diagnosis present

## 2014-02-14 DIAGNOSIS — I509 Heart failure, unspecified: Secondary | ICD-10-CM

## 2014-02-14 DIAGNOSIS — I5032 Chronic diastolic (congestive) heart failure: Secondary | ICD-10-CM | POA: Diagnosis present

## 2014-02-14 DIAGNOSIS — E119 Type 2 diabetes mellitus without complications: Secondary | ICD-10-CM | POA: Diagnosis present

## 2014-02-14 DIAGNOSIS — J449 Chronic obstructive pulmonary disease, unspecified: Secondary | ICD-10-CM | POA: Diagnosis present

## 2014-02-14 DIAGNOSIS — I959 Hypotension, unspecified: Secondary | ICD-10-CM | POA: Diagnosis not present

## 2014-02-14 DIAGNOSIS — I739 Peripheral vascular disease, unspecified: Secondary | ICD-10-CM | POA: Diagnosis present

## 2014-02-14 DIAGNOSIS — I5033 Acute on chronic diastolic (congestive) heart failure: Principal | ICD-10-CM | POA: Diagnosis present

## 2014-02-14 DIAGNOSIS — Z7901 Long term (current) use of anticoagulants: Secondary | ICD-10-CM

## 2014-02-14 DIAGNOSIS — R5381 Other malaise: Secondary | ICD-10-CM

## 2014-02-14 DIAGNOSIS — I10 Essential (primary) hypertension: Secondary | ICD-10-CM

## 2014-02-14 DIAGNOSIS — Z86718 Personal history of other venous thrombosis and embolism: Secondary | ICD-10-CM

## 2014-02-14 DIAGNOSIS — M7989 Other specified soft tissue disorders: Secondary | ICD-10-CM

## 2014-02-14 DIAGNOSIS — I2510 Atherosclerotic heart disease of native coronary artery without angina pectoris: Secondary | ICD-10-CM | POA: Diagnosis present

## 2014-02-14 DIAGNOSIS — I82409 Acute embolism and thrombosis of unspecified deep veins of unspecified lower extremity: Secondary | ICD-10-CM | POA: Diagnosis present

## 2014-02-14 DIAGNOSIS — K219 Gastro-esophageal reflux disease without esophagitis: Secondary | ICD-10-CM | POA: Diagnosis present

## 2014-02-14 DIAGNOSIS — I70209 Unspecified atherosclerosis of native arteries of extremities, unspecified extremity: Secondary | ICD-10-CM | POA: Diagnosis present

## 2014-02-14 DIAGNOSIS — Z8673 Personal history of transient ischemic attack (TIA), and cerebral infarction without residual deficits: Secondary | ICD-10-CM

## 2014-02-14 LAB — CBC WITH DIFFERENTIAL/PLATELET
Basophils Absolute: 0 10*3/uL (ref 0.0–0.1)
Basophils Relative: 0 % (ref 0–1)
Eosinophils Absolute: 0.4 10*3/uL (ref 0.0–0.7)
Eosinophils Relative: 4 % (ref 0–5)
HEMATOCRIT: 36.6 % — AB (ref 39.0–52.0)
HEMOGLOBIN: 11.6 g/dL — AB (ref 13.0–17.0)
LYMPHS PCT: 9 % — AB (ref 12–46)
Lymphs Abs: 0.8 10*3/uL (ref 0.7–4.0)
MCH: 28.9 pg (ref 26.0–34.0)
MCHC: 31.7 g/dL (ref 30.0–36.0)
MCV: 91 fL (ref 78.0–100.0)
MONO ABS: 0.8 10*3/uL (ref 0.1–1.0)
MONOS PCT: 10 % (ref 3–12)
Neutro Abs: 6.7 10*3/uL (ref 1.7–7.7)
Neutrophils Relative %: 77 % (ref 43–77)
Platelets: 267 10*3/uL (ref 150–400)
RBC: 4.02 MIL/uL — ABNORMAL LOW (ref 4.22–5.81)
RDW: 16.3 % — AB (ref 11.5–15.5)
WBC: 8.7 10*3/uL (ref 4.0–10.5)

## 2014-02-14 LAB — COMPREHENSIVE METABOLIC PANEL
ALT: 15 U/L (ref 0–53)
AST: 28 U/L (ref 0–37)
Albumin: 3.3 g/dL — ABNORMAL LOW (ref 3.5–5.2)
Alkaline Phosphatase: 79 U/L (ref 39–117)
BILIRUBIN TOTAL: 1.4 mg/dL — AB (ref 0.3–1.2)
BUN: 19 mg/dL (ref 6–23)
CO2: 31 meq/L (ref 19–32)
CREATININE: 0.87 mg/dL (ref 0.50–1.35)
Calcium: 10 mg/dL (ref 8.4–10.5)
Chloride: 103 mEq/L (ref 96–112)
GFR calc Af Amer: >90 mL/min (ref >90)
GFR calc non Af Amer: 79 mL/min — ABNORMAL LOW (ref >90)
Glucose, Bld: 104 mg/dL — ABNORMAL HIGH (ref 70–99)
Potassium: 3 mEq/L — ABNORMAL LOW (ref 3.7–5.3)
Sodium: 146 mEq/L (ref 137–147)
Total Protein: 6.8 g/dL (ref 6.0–8.3)

## 2014-02-14 LAB — TROPONIN I: Troponin I: <0.3 ng/mL (ref <0.30)

## 2014-02-14 LAB — PROTIME-INR
INR: 2.4 — AB (ref 0.00–1.49)
Prothrombin Time: 25.4 seconds — ABNORMAL HIGH (ref 11.6–15.2)

## 2014-02-14 LAB — PRO B NATRIURETIC PEPTIDE: PRO B NATRI PEPTIDE: 4452 pg/mL — AB (ref 0–450)

## 2014-02-14 MED ORDER — IPRATROPIUM BROMIDE 0.02 % IN SOLN
0.5000 mg | Freq: Once | RESPIRATORY_TRACT | Status: AC
  Administered 2014-02-14: 0.5 mg via RESPIRATORY_TRACT
  Filled 2014-02-14: qty 2.5

## 2014-02-14 MED ORDER — FUROSEMIDE 10 MG/ML IJ SOLN
60.0000 mg | Freq: Once | INTRAMUSCULAR | Status: AC
  Administered 2014-02-14: 60 mg via INTRAVENOUS
  Filled 2014-02-14: qty 6

## 2014-02-14 MED ORDER — ALBUTEROL SULFATE (2.5 MG/3ML) 0.083% IN NEBU
5.0000 mg | INHALATION_SOLUTION | Freq: Once | RESPIRATORY_TRACT | Status: AC
  Administered 2014-02-14: 5 mg via RESPIRATORY_TRACT
  Filled 2014-02-14: qty 6

## 2014-02-14 NOTE — H&P (Signed)
History and Physical  Patient ID: [name] GoodyBobby [name] MRN: [id number], SOB: Oct 08, 1931 80 y.o. Date of Encounter: 02/14/2014, 11:37 PM  Primary Physician: Lucianne LeiUPPIN,NINA, MD Primary Cardiologist: Patty SermonsBrackbill and CHF  Chief Complaint: SOB  HPI: 80 y.o. male w/ PMHx significant for CHF with nl EF, afib, COPD on home 02, h/o CVA, Dm2, HTN, PVD s/p recent atherectomy who [facility] with complaints of SOB and LE swelling.  Reports that this is be going on for approx 1 week. Does not weigh himself. Has home health visiting frequently due to recent hospitalization. No change in medications. Does not follow any sort of low salt diet (eats frozen prepared meals including chicken pot pie). Compliant with medications, no change in lasix dosing. ]  Had fall approx 5 days ago. Sounds mechanical. No head trauma. Head CT negative for acute process.  No chest pain.  Received 60 IV lasix in ER with good response.   EKG revealed afib with anterior q waves, flattening in inferor lateral leads. CXR consistent with fluid overload and efffusions. Labs are significant for BNP of 4400, INR of 2.4, Cr 0.9.   Past Medical History  Diagnosis Date  . Hypertension   . Stroke   . COPD (chronic obstructive pulmonary disease)   . Diabetes mellitus without complication   . GERD (gastroesophageal reflux disease)   . A-fib   . Chronic diastolic HF (heart failure)   . CAD (coronary artery disease)   . PAD (peripheral artery disease)   . CKD (chronic kidney disease), stage III   . DVT (deep venous thrombosis) 12/2013    R posterior tibial vein (12/2013)     Surgical History:  Past Surgical History  Procedure Laterality Date  . Carotid stent    . Parotid gland tumor excision    . Embolectomy Right 11/23/2013    Procedure: Right Femoral EMBOLECTOMY;  Surgeon: Chuck Hinthristopher S Dickson, MD;  [facility];  Service: Vascular;  Laterality: Right;  Right Femoral Embolectomy with Bovine Paricardium  patch angioplasty.     Home Meds: Prior to Admission medications   Medication Sig Start Date End Date Taking? Authorizing Provider  atorvastatin (LIPITOR) 10 MG tablet Take 10 mg by mouth daily at 6 PM.    Yes Historical Provider, MD  diltiazem (CARDIZEM CD) 240 MG 24 hr capsule Take 1 capsule (240 mg total) by mouth daily. 01/03/14  Yes Evlyn KannerPamela S Love, PA-C  furosemide (LASIX) 80 MG tablet Take 0.5 tablets (40 mg total) by mouth daily. 01/03/14  Yes Evlyn KannerPamela S Love, PA-C  glimepiride (AMARYL) 1 MG tablet Take 1 tablet (1 mg total) by mouth daily with breakfast. 01/03/14  Yes Evlyn KannerPamela S Love, PA-C  guaiFENesin (MUCINEX) 600 MG 12 hr tablet Take 1 tablet (600 mg total) by mouth 2 (two) times daily. 01/03/14  Yes Evlyn KannerPamela S Love, PA-C  levalbuterol (XOPENEX) 0.63 MG/3ML nebulizer solution Take 3 mLs (0.63 mg total) by nebulization every 6 (six) hours as needed for wheezing or shortness of breath. 12/05/13  Yes Vassie Lollarlos Madera, MD  linagliptin (TRADJENTA) 5 MG TABS tablet Take 1 tablet (5 mg total) by mouth daily. 01/03/14  Yes Evlyn KannerPamela S Love, PA-C  Multiple Vitamin (MULTIVITAMIN WITH MINERALS) TABS tablet Take 1 tablet by mouth daily. 01/03/14  Yes Evlyn KannerPamela S Love, PA-C  ranitidine (ZANTAC) 150 MG tablet Take 1 tablet (150 mg total) by mouth 2 (two) times daily. 01/03/14  Yes Evlyn KannerPamela S Love, PA-C  sodium chloride (OCEAN) 0.65 % SOLN nasal  spray Place 4 sprays into both nostrils 6 (six) times daily. 01/03/14  Yes Evlyn Kanner Love, PA-C  warfarin (COUMADIN) 2 MG tablet Take 4 mg by mouth daily at 6 PM.   Yes Historical Provider, MD    Allergies: No Known Allergies  History   Social History  . Marital Status: Married    Spouse Name: N/A    Number of Children: N/A  . Years of Education: N/A   Occupational History  . Not on file.   Social History Main Topics  . Smoking status: Former Games developer  . Smokeless tobacco: Not on file  . Alcohol Use: No  . Drug Use: No  . Sexual Activity: Not on file   Other Topics  Concern  . Not on file   Social History Narrative  . No narrative on file     No family history on file.  Review of Systems: General: negative for chills, fever, night sweats or weight changes.  Cardiovascular: see HPI Dermatological: negative for rash Respiratory: see HPI. Urologic: negative for hematuria Abdominal: negative for nausea, vomiting, diarrhea, bright red blood per rectum, melena, or hematemesis Neurologic: negative for visual changes, syncope, or dizziness All other systems reviewed and are otherwise negative except as noted above.  Labs:   Lab Results  Component Value Date   WBC 8.7 02/14/2014   HGB 11.6* 02/14/2014   HCT 36.6* 02/14/2014   MCV 91.0 02/14/2014   PLT 267 02/14/2014    Recent Labs Lab 02/14/14 1531  NA 146  K 3.0*  CL 103  CO2 31  BUN 19  CREATININE 0.87  CALCIUM 10.0  PROT 6.8  BILITOT 1.4*  ALKPHOS 79  ALT 15  AST 28  GLUCOSE 104*    Recent Labs  02/14/14 1531  TROPONINI <0.30   No results found for this basename: CHOL, HDL, LDLCALC, TRIG   No results found for this basename: DDIMER    Radiology/Studies:  Dg Chest 2 View  02/14/2014   CLINICAL DATA:  Status post fall 4 days ago. Shortness of breath for 4 days.  EXAM: CHEST  2 VIEW  COMPARISON:  PA and lateral chest 12/21/2013.  FINDINGS: There is new bilateral airspace disease and effusions, worse on the left. Cardiomegaly is noted. No pneumothorax.  IMPRESSION: Findings most consistent with congestive heart failure with associated left worse than right pleural effusions and basilar atelectasis.   Electronically Signed   By: Drusilla Kanner M.D.   On: 02/14/2014 17:49   Ct Head Wo Contrast  02/14/2014   CLINICAL DATA:  Status post fall 5 days ago.  EXAM: CT HEAD WITHOUT CONTRAST  TECHNIQUE: Contiguous axial images were obtained from the base of the skull through the vertex without intravenous contrast.  COMPARISON:  CT HEAD W/O CM dated 12/11/2013  FINDINGS: There is no evidence of  mass effect, midline shift, or extra-axial fluid collections. There is no evidence of a space-occupying lesion or intracranial hemorrhage. There is no evidence of a cortical-based area of acute infarction. There is generalized cerebral atrophy. There is periventricular white matter low attenuation likely secondary to microangiopathy.  The ventricles and sulci are appropriate for the patient's age. The basal cisterns are patent.  Visualized portions of the orbits are unremarkable. The visualized portions of the paranasal sinuses and mastoid air cells are unremarkable. Cerebrovascular atherosclerotic calcifications are noted.  The osseous structures are unremarkable.  IMPRESSION: No acute intracranial pathology.   Electronically Signed   By: Elige Ko   On:  02/14/2014 16:43     EKG: see HPI  Physical Exam: Blood pressure 119/60, pulse 83, temperature 97.6 F (36.4 C), temperature source Oral, resp. rate 16, SpO2 94.00%. General: elderly, nAD Head: Normocephalic, atraumatic, sclera non-icteric, nares are without discharge Neck: Supple. Negative for carotid bruits. JVD up to jaw at 45 deg Lungs: decreased at bases bilaterally Heart: irreg, irreg, 2/6 SEM at base Abdomen: Soft, non-tender, non-distended with normoactive bowel sounds. No rebound/guarding. No obvious abdominal masses. Msk:  Strength and tone appear normal for age. Extremities: +3 edema to thighs.. No clubbing or cyanosis.  Neuro: Alert and oriented X 3. Moves all extremities spontaneously. Psych:  Responds to questions appropriately with a normal affect.   Problem List 1. CHF with nl EF, acutely decompensated 2. Afib, rate controlled, on coumadin 3. Fall, mechanical, negative head CT 4. COPD 5. DM2 6. Chart history of CKD but currently with nl renal fxn 7. PVD s/p recent thrombectomy 8. Recent prolonged hospitalizations  ASSESSMENT AND PLAN:  80 y.o. male w/ PMHx significant for CHF with nl EF, afib, COPD on home 02, h/o  CVA, Dm2, HTN, PVD s/p recent atherectomy who presented to [facility] on 02/14/2014 with complaints of SOB and LE swelling --> clearly in acute decompensated heart failure by exam and labs.  Cause of his decompensation appears to be clearly diet related and both him and his wife need to be re-educated. Weight monitoring also needed (?why isn't home health helping with this?)  Continue IV lasix at this time, goal negative of 1 liter/day. Anticipate several days if diuresis.  PVD: continue statin, warfarin.  Chronic afib. Continue warfarin and diltiazem.  Continue home diabetic meds (except for linagliptin). Cover with sliding scale.  Continue nebs.  Prophylaxis: Warfarin H2 blocker  Full code.  Signed, Adolm Joseph, Troy Sine MD 02/14/2014, 11:37 PM

## 2014-02-14 NOTE — ED Notes (Signed)
Cardiologist in room

## 2014-02-14 NOTE — ED Notes (Signed)
Pt is on home o2 and home health has  been worried about his congestion and low o2 sats  He has fallen on sun bumped his head and wife  States that he is on coumadin and tha he has been slurring his words for a week

## 2014-02-14 NOTE — ED Notes (Signed)
Pt and wife report increased SOB, lethargy and bilateral lower extremity swelling x 1 week. Pt has history of CHF and COPD.

## 2014-02-14 NOTE — ED Notes (Signed)
Patient transported to X-ray 

## 2014-02-14 NOTE — ED Provider Notes (Signed)
CSN: [id number]     Arrival date & time 02/14/14  1415 History   First MD Initiated Contact with Patient 02/14/14 [phone]     Chief Complaint  Patient presents with  . Shortness of Breath  . Fall      HPI Family reports worsening shortness of breath her the past week to 2 weeks.  Home health that his oxygen level was low.  Patient is on 2 L home oxygen as he has a history of COPD.  On Coumadin.  His course has been complicated as of recently as he had a arterial thrombus in December 2014.  He still currently taking Coumadin.  He has a history of chronic diastolic heart failure chronic atrial fibrillation.  He also has a history of COPD, stroke, hypertension, diabetes.  He does have a history of coronary disease.  He denies active chest pain at this time.  He reports worsening orthopnea and increasing lower extremity edema.   Past Medical History  Diagnosis Date  . Hypertension   . Stroke   . COPD (chronic obstructive pulmonary disease)   . Diabetes mellitus without complication   . GERD (gastroesophageal reflux disease)   . A-fib   . Chronic diastolic HF (heart failure)   . CAD (coronary artery disease)   . PAD (peripheral artery disease)   . CKD (chronic kidney disease), stage III   . DVT (deep venous thrombosis) 12/2013    R posterior tibial vein (12/2013)   Past Surgical History  Procedure Laterality Date  . Carotid stent    . Parotid gland tumor excision    . Embolectomy Right 11/23/2013    Procedure: Right Femoral EMBOLECTOMY;  Surgeon: Chuck Hint, MD;  Location: [facility] OR;  Service: Vascular;  Laterality: Right;  Right Femoral Embolectomy with Bovine Paricardium patch angioplasty.   No family history on file. History  Substance Use Topics  . Smoking status: Former Games developer  . Smokeless tobacco: Not on file  . Alcohol Use: No    Review of Systems  All other systems reviewed and are negative.      Allergies  Review of patient's allergies indicates no known  allergies.  Home Medications   Current Outpatient Rx  Name  Route  Sig  Dispense  Refill  . atorvastatin (LIPITOR) 10 MG tablet   Oral   Take 10 mg by mouth daily at 6 PM.          . diltiazem (CARDIZEM CD) 240 MG 24 hr capsule   Oral   Take 1 capsule (240 mg total) by mouth daily.   30 capsule   1   . furosemide (LASIX) 80 MG tablet   Oral   Take 0.5 tablets (40 mg total) by mouth daily.   30 tablet   1   . glimepiride (AMARYL) 1 MG tablet   Oral   Take 1 tablet (1 mg total) by mouth daily with breakfast.   30 tablet   1   . guaiFENesin (MUCINEX) 600 MG 12 hr tablet   Oral   Take 1 tablet (600 mg total) by mouth 2 (two) times daily.   30 tablet   1   . levalbuterol (XOPENEX) 0.63 MG/3ML nebulizer solution   Nebulization   Take 3 mLs (0.63 mg total) by nebulization every 6 (six) hours as needed for wheezing or shortness of breath.         . linagliptin (TRADJENTA) 5 MG TABS tablet   Oral   Take 1  tablet (5 mg total) by mouth daily.   30 tablet   1   . Multiple Vitamin (MULTIVITAMIN WITH MINERALS) TABS tablet   Oral   Take 1 tablet by mouth daily.         . ranitidine (ZANTAC) 150 MG tablet   Oral   Take 1 tablet (150 mg total) by mouth 2 (two) times daily.   60 tablet   1   . sodium chloride (OCEAN) 0.65 % SOLN nasal spray   Each Nare   Place 4 sprays into both nostrils 6 (six) times daily.      0   . warfarin (COUMADIN) 2 MG tablet   Oral   Take 4 mg by mouth daily at 6 PM.          BP 140/77  Pulse 71  Temp(Src) 97.6 F (36.4 C) (Oral)  Resp 15  SpO2 93% Physical Exam  Nursing note and vitals reviewed. Constitutional: He is oriented to person, place, and time. He appears well-developed and well-nourished.  HENT:  Head: Normocephalic and atraumatic.  Eyes: EOM are normal.  Neck: Normal range of motion.  Cardiovascular: Normal rate, regular rhythm, normal heart sounds and intact distal pulses.   Pulmonary/Chest: Effort normal and  breath sounds normal.  Rales in the bases.  Mild tachypnea  Abdominal: Soft. He exhibits no distension. There is no tenderness.  Musculoskeletal: Normal range of motion.  2+ pitting edema bilaterally  Neurological: He is alert and oriented to person, place, and time.  Skin: Skin is warm and dry.  Psychiatric: He has a normal mood and affect. Judgment normal.    ED Course  Procedures (including critical care time) Labs Review Labs Reviewed  CBC WITH DIFFERENTIAL - Abnormal; Notable for the following:    RBC 4.02 (*)    Hemoglobin 11.6 (*)    HCT 36.6 (*)    RDW 16.3 (*)    Lymphocytes Relative 9 (*)    All other components within normal limits  COMPREHENSIVE METABOLIC PANEL - Abnormal; Notable for the following:    Potassium 3.0 (*)    Glucose, Bld 104 (*)    Albumin 3.3 (*)    Total Bilirubin 1.4 (*)    GFR calc non Af Amer 79 (*)    All other components within normal limits  PROTIME-INR - Abnormal; Notable for the following:    Prothrombin Time 25.4 (*)    INR 2.40 (*)    All other components within normal limits  PRO B NATRIURETIC PEPTIDE - Abnormal; Notable for the following:    Pro B Natriuretic peptide (BNP) 4452.0 (*)    All other components within normal limits  TROPONIN I   Imaging Review Dg Chest 2 View  02/14/2014   CLINICAL DATA:  Status post fall 4 days ago. Shortness of breath for 4 days.  EXAM: CHEST  2 VIEW  COMPARISON:  PA and lateral chest 12/21/2013.  FINDINGS: There is new bilateral airspace disease and effusions, worse on the left. Cardiomegaly is noted. No pneumothorax.  IMPRESSION: Findings most consistent with congestive heart failure with associated left worse than right pleural effusions and basilar atelectasis.   Electronically Signed   By: Drusilla Kannerhomas  Dalessio M.D.   On: 02/14/2014 17:49   Ct Head Wo Contrast  02/14/2014   CLINICAL DATA:  Status post fall 5 days ago.  EXAM: CT HEAD WITHOUT CONTRAST  TECHNIQUE: Contiguous axial images were obtained from  the base of the skull through the vertex without  intravenous contrast.  COMPARISON:  CT HEAD W/O CM dated 12/11/2013  FINDINGS: There is no evidence of mass effect, midline shift, or extra-axial fluid collections. There is no evidence of a space-occupying lesion or intracranial hemorrhage. There is no evidence of a cortical-based area of acute infarction. There is generalized cerebral atrophy. There is periventricular white matter low attenuation likely secondary to microangiopathy.  The ventricles and sulci are appropriate for the patient's age. The basal cisterns are patent.  Visualized portions of the orbits are unremarkable. The visualized portions of the paranasal sinuses and mastoid air cells are unremarkable. Cerebrovascular atherosclerotic calcifications are noted.  The osseous structures are unremarkable.  IMPRESSION: No acute intracranial pathology.   Electronically Signed   By: Elige Ko   On: 02/14/2014 16:43  I personally reviewed the imaging tests through PACS system I reviewed available ER/hospitalization records through the EMR    EKG Interpretation   Date/Time:  Wednesday February 14 2014 15:37:07 EST Ventricular Rate:  70 PR Interval:    QRS Duration: 80 QT Interval:  422 QTC Calculation: 455 R Axis:   78 Text Interpretation:  Atrial fibrillation Anterior infarct, old  Nonspecific T abnormalities, lateral leads No significant change was found  Confirmed by Anel Purohit  MD, Zeffie Bickert ([id number]) on 02/14/2014 8:36:21 PM      MDM   Final diagnoses:  Acute on chronic diastolic CHF (congestive heart failure)    The patient will be admitted for a congestive heart failure exacerbation.  IV Lasix in the emergency department.  Cardiology consultation    Lyanne Co, MD 02/14/14 2036

## 2014-02-14 NOTE — Progress Notes (Signed)
*  Preliminary Results* Bilateral lower extremity venous duplex completed. Bilateral lower extremities are negative for deep vein thrombosis. There is no evidence of Baker's cyst bilaterally.  02/14/2014  Gertie FeyMichelle Jahvon Gosline, RVT, RDCS, RDMS

## 2014-02-14 NOTE — ED Notes (Signed)
Patient transported to Ultrasound 

## 2014-02-15 ENCOUNTER — Encounter (HOSPITAL_COMMUNITY): Payer: Self-pay | Admitting: *Deleted

## 2014-02-15 DIAGNOSIS — I5033 Acute on chronic diastolic (congestive) heart failure: Principal | ICD-10-CM

## 2014-02-15 DIAGNOSIS — R609 Edema, unspecified: Secondary | ICD-10-CM

## 2014-02-15 DIAGNOSIS — I4891 Unspecified atrial fibrillation: Secondary | ICD-10-CM

## 2014-02-15 DIAGNOSIS — E1169 Type 2 diabetes mellitus with other specified complication: Secondary | ICD-10-CM

## 2014-02-15 DIAGNOSIS — I509 Heart failure, unspecified: Secondary | ICD-10-CM

## 2014-02-15 DIAGNOSIS — J449 Chronic obstructive pulmonary disease, unspecified: Secondary | ICD-10-CM

## 2014-02-15 DIAGNOSIS — I739 Peripheral vascular disease, unspecified: Secondary | ICD-10-CM

## 2014-02-15 LAB — BASIC METABOLIC PANEL
BUN: 14 mg/dL (ref 6–23)
CO2: 28 meq/L (ref 19–32)
CREATININE: 0.81 mg/dL (ref 0.50–1.35)
Calcium: 9.8 mg/dL (ref 8.4–10.5)
Chloride: 101 mEq/L (ref 96–112)
GFR calc non Af Amer: 81 mL/min — ABNORMAL LOW (ref >90)
Glucose, Bld: 69 mg/dL — ABNORMAL LOW (ref 70–99)
POTASSIUM: 2.9 meq/L — AB (ref 3.7–5.3)
Sodium: 147 mEq/L (ref 137–147)

## 2014-02-15 LAB — PROTIME-INR
INR: 1.75 — ABNORMAL HIGH (ref 0.00–1.49)
PROTHROMBIN TIME: 19.9 s — AB (ref 11.6–15.2)

## 2014-02-15 LAB — GLUCOSE, CAPILLARY
GLUCOSE-CAPILLARY: 103 mg/dL — AB (ref 70–99)
GLUCOSE-CAPILLARY: 203 mg/dL — AB (ref 70–99)
Glucose-Capillary: 73 mg/dL (ref 70–99)
Glucose-Capillary: 114 mg/dL — ABNORMAL HIGH (ref 70–99)
Glucose-Capillary: 142 mg/dL — ABNORMAL HIGH (ref 70–99)

## 2014-02-15 MED ORDER — POTASSIUM CHLORIDE CRYS ER 20 MEQ PO TBCR
40.0000 meq | EXTENDED_RELEASE_TABLET | ORAL | Status: AC
  Administered 2014-02-15 (×3): 40 meq via ORAL
  Filled 2014-02-15 (×2): qty 2

## 2014-02-15 MED ORDER — DILTIAZEM HCL ER COATED BEADS 240 MG PO CP24
240.0000 mg | ORAL_CAPSULE | Freq: Every day | ORAL | Status: DC
  Administered 2014-02-15 – 2014-02-20 (×6): 240 mg via ORAL
  Filled 2014-02-15 (×6): qty 1

## 2014-02-15 MED ORDER — WARFARIN SODIUM 4 MG PO TABS
4.0000 mg | ORAL_TABLET | Freq: Every day | ORAL | Status: DC
  Administered 2014-02-15: 4 mg via ORAL
  Filled 2014-02-15 (×2): qty 1

## 2014-02-15 MED ORDER — WARFARIN - PHARMACIST DOSING INPATIENT
Freq: Every day | Status: DC
  Administered 2014-02-18: 18:00:00

## 2014-02-15 MED ORDER — POTASSIUM CHLORIDE CRYS ER 20 MEQ PO TBCR
40.0000 meq | EXTENDED_RELEASE_TABLET | ORAL | Status: DC
  Filled 2014-02-15: qty 2

## 2014-02-15 MED ORDER — FUROSEMIDE 10 MG/ML IJ SOLN
80.0000 mg | Freq: Two times a day (BID) | INTRAMUSCULAR | Status: DC
  Administered 2014-02-15 – 2014-02-16 (×3): 80 mg via INTRAVENOUS
  Filled 2014-02-15 (×6): qty 8

## 2014-02-15 MED ORDER — WARFARIN SODIUM 6 MG PO TABS
6.0000 mg | ORAL_TABLET | Freq: Once | ORAL | Status: AC
  Administered 2014-02-15: 6 mg via ORAL
  Filled 2014-02-15: qty 1

## 2014-02-15 MED ORDER — SODIUM CHLORIDE 0.9 % IJ SOLN
3.0000 mL | INTRAMUSCULAR | Status: DC | PRN
Start: 2014-02-15 — End: 2014-02-20
  Administered 2014-02-20: 3 mL via INTRAVENOUS

## 2014-02-15 MED ORDER — INSULIN ASPART 100 UNIT/ML ~~LOC~~ SOLN
0.0000 [IU] | Freq: Three times a day (TID) | SUBCUTANEOUS | Status: DC
  Administered 2014-02-15: 1 [IU] via SUBCUTANEOUS
  Administered 2014-02-17: 17:00:00 via SUBCUTANEOUS
  Administered 2014-02-18 – 2014-02-20 (×3): 3 [IU] via SUBCUTANEOUS

## 2014-02-15 MED ORDER — SODIUM CHLORIDE 0.9 % IJ SOLN
3.0000 mL | Freq: Two times a day (BID) | INTRAMUSCULAR | Status: DC
  Administered 2014-02-15 – 2014-02-19 (×11): 3 mL via INTRAVENOUS

## 2014-02-15 MED ORDER — ALBUTEROL SULFATE (2.5 MG/3ML) 0.083% IN NEBU
2.5000 mg | INHALATION_SOLUTION | Freq: Four times a day (QID) | RESPIRATORY_TRACT | Status: DC | PRN
  Administered 2014-02-17: 2.5 mg via RESPIRATORY_TRACT
  Filled 2014-02-15: qty 3

## 2014-02-15 MED ORDER — ATORVASTATIN CALCIUM 10 MG PO TABS
10.0000 mg | ORAL_TABLET | Freq: Every day | ORAL | Status: DC
  Administered 2014-02-15 – 2014-02-19 (×5): 10 mg via ORAL
  Filled 2014-02-15 (×6): qty 1

## 2014-02-15 MED ORDER — FUROSEMIDE 10 MG/ML IJ SOLN
60.0000 mg | Freq: Two times a day (BID) | INTRAMUSCULAR | Status: DC
  Administered 2014-02-15: 60 mg via INTRAVENOUS
  Filled 2014-02-15 (×2): qty 6

## 2014-02-15 MED ORDER — FAMOTIDINE 20 MG PO TABS
20.0000 mg | ORAL_TABLET | Freq: Two times a day (BID) | ORAL | Status: DC
Start: 2014-02-15 — End: 2014-02-20
  Administered 2014-02-15 – 2014-02-20 (×12): 20 mg via ORAL
  Filled 2014-02-15 (×13): qty 1

## 2014-02-15 MED ORDER — GLIMEPIRIDE 1 MG PO TABS
1.0000 mg | ORAL_TABLET | Freq: Every day | ORAL | Status: DC
  Administered 2014-02-15 – 2014-02-20 (×4): 1 mg via ORAL
  Filled 2014-02-15 (×9): qty 1

## 2014-02-15 MED ORDER — SODIUM CHLORIDE 0.9 % IV SOLN: 250.0000 mL | INTRAVENOUS | Status: DC | PRN

## 2014-02-15 MED ORDER — WARFARIN SODIUM 4 MG PO TABS: 4.0000 mg | ORAL_TABLET | Freq: Every day | ORAL | Status: DC

## 2014-02-15 NOTE — Progress Notes (Signed)
CRITICAL VALUE ALERT  Critical value received:  K=2.9  Date of notification:  02/15/2014  Time of notification:  0646  Critical value read back:yes  Nurse who received alert:  Phillips GroutYasemia Lebron, RN  MD notified (1st page):  Ronie Spiesayna Dunn, PA  Time of first page:  [phone]  Awaiting new orders per Dunn.  RN will continue to monitor. Louretta ParmaAshley Pesach Frisch, RN

## 2014-02-15 NOTE — Progress Notes (Signed)
Primary Physician: Dr. Mathis BudUppin  Primary Cardiologist Dr. Patty SermonsBrackbill CHF clinic: Seen by Ulla PotashAli Cosgrove, NP  Subjective:  Breathing better. No CP. Knows that he does not eat well at home.   Objective:  Vital Signs in the last 24 hours: Temp:  [97.5 F (36.4 C)-97.6 F (36.4 C)] 97.6 F (36.4 C) (03/05 0656) Pulse Rate:  [48-101] 96 (03/05 0656) Resp:  [14-22] 17 (03/05 0656) BP: (119-140)/(40-86) 127/75 mmHg (03/05 1038) SpO2:  [89 %-100 %] 94 % (03/05 0656) Weight:  [191 lb 2.2 oz (86.7 kg)] 191 lb 2.2 oz (86.7 kg) (03/05 0656)  Intake/Output from previous day: 03/04 0701 - 03/05 0700 In: 222 [P.O.:222] Out: 890 [Urine:890]   Physical Exam: General: Well developed, well nourished, in no acute distress. Head:  Normocephalic and atraumatic. JVD to Jaw line Lungs: Clear to auscultation and percussion. Heart: Irreg irreg.  2/6 SEM no rubs or gallops.  Abdomen: soft, non-tender, positive bowel sounds. Extremities: No clubbing or cyanosis. 2+ BLE edema. Neurologic: Alert. Left arm weakness.     Lab Results:  Recent Labs  02/14/14 1531  WBC 8.7  HGB 11.6*  PLT 267    Recent Labs  02/14/14 1531 02/15/14 0500  NA 146 147  K 3.0* 2.9*  CL 103 101  CO2 31 28  GLUCOSE 104* 69*  BUN 19 14  CREATININE 0.87 0.81    Recent Labs  02/14/14 1531  TROPONINI <0.30   Hepatic Function Panel  Recent Labs  02/14/14 1531  PROT 6.8  ALBUMIN 3.3*  AST 28  ALT 15  ALKPHOS 79  BILITOT 1.4*   Imaging: Dg Chest 2 View  02/14/2014   CLINICAL DATA:  Status post fall 4 days ago. Shortness of breath for 4 days.  EXAM: CHEST  2 VIEW  COMPARISON:  PA and lateral chest 12/21/2013.  FINDINGS: There is new bilateral airspace disease and effusions, worse on the left. Cardiomegaly is noted. No pneumothorax.  IMPRESSION: Findings most consistent with congestive heart failure with associated left worse than right pleural effusions and basilar atelectasis.   Electronically Signed   By:  Drusilla Kannerhomas  Dalessio M.D.   On: 02/14/2014 17:49   Ct Head Wo Contrast  02/14/2014   CLINICAL DATA:  Status post fall 5 days ago.  EXAM: CT HEAD WITHOUT CONTRAST  TECHNIQUE: Contiguous axial images were obtained from the base of the skull through the vertex without intravenous contrast.  COMPARISON:  CT HEAD W/O CM dated 12/11/2013  FINDINGS: There is no evidence of mass effect, midline shift, or extra-axial fluid collections. There is no evidence of a space-occupying lesion or intracranial hemorrhage. There is no evidence of a cortical-based area of acute infarction. There is generalized cerebral atrophy. There is periventricular white matter low attenuation likely secondary to microangiopathy.  The ventricles and sulci are appropriate for the patient's age. The basal cisterns are patent.  Visualized portions of the orbits are unremarkable. The visualized portions of the paranasal sinuses and mastoid air cells are unremarkable. Cerebrovascular atherosclerotic calcifications are noted.  The osseous structures are unremarkable.  IMPRESSION: No acute intracranial pathology.   Electronically Signed   By: Elige KoHetal  Patel   On: 02/14/2014 16:43   Personally viewed.   Telemetry: AFIB Personally viewed.   EKG:  AFIB 70, NSSTW changes  Cardiac Studies:   ECHO 11/24/13 - Left ventricle: Systolic function was normal. The estimated ejection fraction was in the range of 60% to 65%. - Mitral valve: Mildly to moderately calcified annulus. -  Pericardium, extracardiac: A trivial pericardial effusion was identified.  Marland Kitchen atorvastatin  10 mg Oral q1800  . diltiazem  240 mg Oral Daily  . famotidine  20 mg Oral BID  . furosemide  60 mg Intravenous BID  . glimepiride  1 mg Oral Q breakfast  . insulin aspart  0-15 Units Subcutaneous TID WC  . potassium chloride  40 mEq Oral Q2H  . sodium chloride  3 mL Intravenous Q12H  . warfarin  4 mg Oral q1800  . Warfarin - Pharmacist Dosing Inpatient   Does not apply q1800    Weight: 02/16/14 190. Lowest was 165 on 1/15.   Assessment/Plan:   80 year old with acute on chronic diastolic heart failure, weight up 35 pounds, AFIB, anticoagulation, DM, dietary misgivings, PVD, HTN, COPD on home O2, recent fall.   1) Acute on chronic diastolic HF  - diet education  - Increase lasix to 80 IV BID  - Replete potassium aggressively  - Normal EF  2) AFIB  - stable rate.   - Continue diltiazem  - Coumadin per pharm. Slightly sub tx this am.   3) PVD  - stable  4) DM  - no change  5) Hypokalemia  - replete. Will need to reorder tomorrow.   6) COPD  - home o2  At high risk for readmission.   SKAINS, MARK 02/15/2014, 10:39 AM

## 2014-02-15 NOTE — Progress Notes (Signed)
Utilization Review Completed.  Pt meets for CHF. Tobby Fawcett J. Lucretia RoersWood, RN, BSN, [facility] [phone].

## 2014-02-15 NOTE — Progress Notes (Signed)
[facility]  Patient Status: Active (receiving services up to time of hospitalization)  [facility] is providing the following services: RN and PT  If patient discharges after hours, please call [phone].   Kizzie FurnishDonna Fellmy 02/15/2014, 9:37 AM

## 2014-02-15 NOTE — Progress Notes (Signed)
Report given to receiving RN. Patient in bed watching TV. No verbal complaints or signs of distress. 

## 2014-02-15 NOTE — Progress Notes (Signed)
ANTICOAGULATION CONSULT NOTE - Initial Consult  Pharmacy Consult for Coumadin Indication: atrial fibrillation  No Known Allergies  Patient Measurements: Height: 5\' 7"  (170.2 cm) Weight: 191 lb 2.2 oz (86.7 kg) (bed scale, pt unable to stand) IBW/kg (Calculated) : 66.1  Vital Signs: Temp: 97.5 F (36.4 C) (03/05 0021) Temp src: Oral (03/05 0021) BP: 130/69 mmHg (03/05 0021) Pulse Rate: 77 (03/05 0021)  Labs:  Recent Labs  02/14/14 1531  HGB 11.6*  HCT 36.6*  PLT 267  LABPROT 25.4*  INR 2.40*  CREATININE 0.87  TROPONINI <0.30    Estimated Creatinine Clearance: 70 ml/min (by C-G formula based on Cr of 0.87).   Medical History: Past Medical History  Diagnosis Date  . Hypertension   . Stroke   . COPD (chronic obstructive pulmonary disease)   . Diabetes mellitus without complication   . GERD (gastroesophageal reflux disease)   . A-fib   . Chronic diastolic HF (heart failure)   . CAD (coronary artery disease)   . PAD (peripheral artery disease)   . CKD (chronic kidney disease), stage III   . DVT (deep venous thrombosis) 12/2013    R posterior tibial vein (12/2013)    Medications:  Prescriptions prior to admission  Medication Sig Dispense Refill  . atorvastatin (LIPITOR) 10 MG tablet Take 10 mg by mouth daily at 6 PM.       . diltiazem (CARDIZEM CD) 240 MG 24 hr capsule Take 1 capsule (240 mg total) by mouth daily.  30 capsule  1  . furosemide (LASIX) 80 MG tablet Take 0.5 tablets (40 mg total) by mouth daily.  30 tablet  1  . glimepiride (AMARYL) 1 MG tablet Take 1 tablet (1 mg total) by mouth daily with breakfast.  30 tablet  1  . guaiFENesin (MUCINEX) 600 MG 12 hr tablet Take 1 tablet (600 mg total) by mouth 2 (two) times daily.  30 tablet  1  . levalbuterol (XOPENEX) 0.63 MG/3ML nebulizer solution Take 3 mLs (0.63 mg total) by nebulization every 6 (six) hours as needed for wheezing or shortness of breath.      . linagliptin (TRADJENTA) 5 MG TABS tablet Take 1  tablet (5 mg total) by mouth daily.  30 tablet  1  . Multiple Vitamin (MULTIVITAMIN WITH MINERALS) TABS tablet Take 1 tablet by mouth daily.      . ranitidine (ZANTAC) 150 MG tablet Take 1 tablet (150 mg total) by mouth 2 (two) times daily.  60 tablet  1  . sodium chloride (OCEAN) 0.65 % SOLN nasal spray Place 4 sprays into both nostrils 6 (six) times daily.    0  . warfarin (COUMADIN) 2 MG tablet Take 4 mg by mouth daily at 6 PM.       Scheduled:  . atorvastatin  10 mg Oral q1800  . diltiazem  240 mg Oral Daily  . famotidine  20 mg Oral BID  . furosemide  60 mg Intravenous BID  . glimepiride  1 mg Oral Q breakfast  . insulin aspart  0-15 Units Subcutaneous TID WC  . sodium chloride  3 mL Intravenous Q12H    Assessment: 80yo male c/o SOB and LE swelling, EKG reveals Afib, CXR c/w fluid overload and effusions, admitted for HF decompensation, to continue Coumadin for Afib; admitted w/ therapeutic INR.  Goal of Therapy:  INR 2-3   Plan:  Will continue home Coumadin dose of 4mg  daily and monitor INR for dose adjustments.  Vernard GamblesVeronda Robey Massmann, PharmD, BCPS  02/15/2014,12:52 AM

## 2014-02-15 NOTE — Progress Notes (Signed)
[facility] to provide [facility].  Patient noted to be in acute distress.  Will follow up at later date. Of note, [facility] Care Management services does not replace or interfere with any services that are arranged by inpatient case management or social work.  For additional questions or referrals please contact Anibal Hendersonim Henderson BSN RN [facility] Hospital Liaison at [phone].

## 2014-02-15 NOTE — Progress Notes (Signed)
I stopped in to see Mr. [name] [name].  He is a AHF clinic patient.  He missed 02/08/14 appt secondary to bad weather.  I spoke with he, his wife, and son regarding his HF diagnosis. His wife says that they have been unable to weigh because of a brace that he is wearing --digital scale won't read.  They have not yet gotten a "regular scale" to enable daily weights.  His wife claims that when they have weighed he has been around the same as previous discharge weight.  He is up 13.74 pounds from his appt in January.  He was experiencing some shortness of breath and had an episode of increased HR while I was in room with HR up to 130 Afib.  I will plan to see him again to reinforce HF education as clearly they seem confused about when to call the physician.  Driscilla Moatsaphne Dontell Mian RN, BSN, PCCN--Raiden Haydu RN, BSN, PCCN--Heart Failure Statisticianurse Navigator

## 2014-02-15 NOTE — Progress Notes (Signed)
ANTICOAGULATION CONSULT NOTE - Initial Consult  Pharmacy Consult for Coumadin Indication: atrial fibrillation  No Known Allergies  Patient Measurements: Height: 5\' 7"  (170.2 cm) Weight: 191 lb 2.2 oz (86.7 kg) IBW/kg (Calculated) : 66.1  Vital Signs: Temp: 97.6 F (36.4 C) (03/05 0656) Temp src: Oral (03/05 0656) BP: 127/75 mmHg (03/05 1038) Pulse Rate: 108 (03/05 1038)  Labs:  Recent Labs  02/14/14 1531 02/15/14 0500  HGB 11.6*  --   HCT 36.6*  --   PLT 267  --   LABPROT 25.4* 19.9*  INR 2.40* 1.75*  CREATININE 0.87 0.81  TROPONINI <0.30  --     Estimated Creatinine Clearance: 75.2 ml/min (by C-G formula based on Cr of 0.81).   Assessment: 80yo male c/o SOB and LE swelling, EKG reveals Afib, CXR c/w fluid overload and effusions, admitted for HF decompensation, to continue Coumadin for Afib; admitted w/ therapeutic INR although it was sub-therapeutic at 1.75 this AM. Will give a boost dose today. All other labs as above.   Goal of Therapy:  INR 2-3   Plan:  Coumadin 6 mg x 1 dose  Daily INR, and s/s of bleeding   Vinnie LevelBenjamin Toua Stites, PharmD.  Clinical Pharmacist Pager [phone]

## 2014-02-15 NOTE — Progress Notes (Signed)
Pt admitted to 3E12 from ER, came by stretcher, VSS, denies any pain or discomfort at this time. Pt oriented to the unit and to his room and encouraged to call for assistance to get OOB to prevent any fall or injury while in the hospital. We'll continue with POC.

## 2014-02-16 DIAGNOSIS — Z7901 Long term (current) use of anticoagulants: Secondary | ICD-10-CM

## 2014-02-16 LAB — GLUCOSE, CAPILLARY
GLUCOSE-CAPILLARY: 107 mg/dL — AB (ref 70–99)
GLUCOSE-CAPILLARY: 135 mg/dL — AB (ref 70–99)
Glucose-Capillary: 56 mg/dL — ABNORMAL LOW (ref 70–99)
Glucose-Capillary: 114 mg/dL — ABNORMAL HIGH (ref 70–99)
Glucose-Capillary: 98 mg/dL (ref 70–99)
Glucose-Capillary: 106 mg/dL — ABNORMAL HIGH (ref 70–99)

## 2014-02-16 LAB — BASIC METABOLIC PANEL
BUN: 16 mg/dL (ref 6–23)
CHLORIDE: 99 meq/L (ref 96–112)
CO2: 33 mEq/L — ABNORMAL HIGH (ref 19–32)
Calcium: 9.8 mg/dL (ref 8.4–10.5)
Creatinine, Ser: 1.06 mg/dL (ref 0.50–1.35)
GFR calc Af Amer: 74 mL/min — ABNORMAL LOW (ref >90)
GFR, EST NON AFRICAN AMERICAN: 64 mL/min — AB (ref >90)
Glucose, Bld: 57 mg/dL — ABNORMAL LOW (ref 70–99)
Potassium: 3.7 mEq/L (ref 3.7–5.3)
Sodium: 143 mEq/L (ref 137–147)

## 2014-02-16 LAB — PROTIME-INR
INR: 2.2 — ABNORMAL HIGH (ref 0.00–1.49)
Prothrombin Time: 23.7 seconds — ABNORMAL HIGH (ref 11.6–15.2)

## 2014-02-16 MED ORDER — WARFARIN SODIUM 4 MG PO TABS
4.0000 mg | ORAL_TABLET | Freq: Every day | ORAL | Status: AC
  Administered 2014-02-16: 4 mg via ORAL
  Filled 2014-02-16: qty 1

## 2014-02-16 NOTE — Progress Notes (Signed)
Patient alert and oriented x4 throughout shift; family at bedside this evening.  Patient denies any questions or concerns and is anxious for his potential discharge home tomorrow.  Patient resting comfortably in bed; phone and call bell within reach.  Will continue to monitor.

## 2014-02-16 NOTE — Progress Notes (Signed)
    Subjective:  He says he is feeling better.   Objective:  Vital Signs in the last 24 hours: Temp:  [97.4 F (36.3 C)-97.9 F (36.6 C)] 97.9 F (36.6 C) (03/06 0550) Pulse Rate:  [72-115] 85 (03/06 0924) Resp:  [16-20] 18 (03/06 0550) BP: (94-147)/(50-78) 122/61 mmHg (03/06 0924) SpO2:  [96 %-99 %] 99 % (03/06 0924) Weight:  [189 lb 11.2 oz (86.047 kg)] 189 lb 11.2 oz (86.047 kg) (03/06 0550)  Intake/Output from previous day:  Intake/Output Summary (Last 24 hours) at 02/16/14 1109 Last data filed at 02/16/14 1021  Gross per 24 hour  Intake    723 ml  Output   3400 ml  Net  -2677 ml    Physical Exam: General appearance: alert, cooperative, no distress and chronically ill appearing Lungs: Decreased breath sounds Rt > Lt Heart: irregularly irregular rhythm and 2/6 AS murmur   Rate: 82  Rhythm: atrial fibrillation  Lab Results:  Recent Labs  02/14/14 1531  WBC 8.7  HGB 11.6*  PLT 267    Recent Labs  02/15/14 0500 02/16/14 0331  NA 147 143  K 2.9* 3.7  CL 101 99  CO2 28 33*  GLUCOSE 69* 57*  BUN 14 16  CREATININE 0.81 1.06    Recent Labs  02/14/14 1531  TROPONINI <0.30    Recent Labs  02/16/14 0331  INR 2.20*    Imaging: Imaging results have been reviewed  Cardiac Studies:  Assessment/Plan:   Principal Problem:   Acute on chronic diastolic CHF (congestive heart failure) Active Problems:   Permanent atrial fibrillation   COPD (chronic obstructive pulmonary disease)   DM type 2, controlled, with complication   Chronic diastolic heart failure   H/O: CVA (cerebrovascular accident)   PVD- s/p Rt fem embolectomy 12/14   DVT Rt leg Jan 2015   Chronic anticoagulation    PLAN: Diuresing on IV Lasix. Hard to know where the baseline is as he can't be weighed arcuately. His CO2 is up to 33, BUN/SCr still WNL. Not sure he is ready for discharge, MD to see. He has a follow up with CHF scheduled.   Corine ShelterLuke Kilroy PA-C Beeper [phone] 02/16/2014,  11:09 AM  Personally seen and examined, agree with above.  I would like to continue with IV lasix today. Could consider DC tomorrow. He really wants to go home.  He feels better but he is no where near his base weight in 1/15 of 170 pounds.   Donato SchultzSKAINS, Markey Deady, MD

## 2014-02-16 NOTE — Progress Notes (Signed)
ANTICOAGULATION CONSULT NOTE - Follow up Consult  Pharmacy Consult for Coumadin Indication: atrial fibrillation  No Known Allergies  Patient Measurements: Height: 5\' 7"  (170.2 cm) Weight: 189 lb 11.2 oz (86.047 kg) (bed scale) IBW/kg (Calculated) : 66.1  Vital Signs: Temp: 97.9 F (36.6 C) (03/06 0550) Temp src: Oral (03/06 0550) BP: 122/61 mmHg (03/06 0924) Pulse Rate: 85 (03/06 0924)  Labs:  Recent Labs  02/14/14 1531 02/15/14 0500 02/16/14 0331  HGB 11.6*  --   --   HCT 36.6*  --   --   PLT 267  --   --   LABPROT 25.4* 19.9* 23.7*  INR 2.40* 1.75* 2.20*  CREATININE 0.87 0.81 1.06  TROPONINI <0.30  --   --     Estimated Creatinine Clearance: 57.3 ml/min (by C-G formula based on Cr of 1.06).   Assessment: 80yo male c/o SOB and LE swelling, EKG reveals Afib, CXR c/w fluid overload and effusions, admitted for HF decompensation, to continue Coumadin for Afib; INR is therapeutic today at 2.2 after yesterday's boost dose. Will resume home coumadin dose. Pt reported no signs and symptoms of bleeding.   Goal of Therapy:  INR 2-3   Plan:  Coumadin 4 mg daily  Daily INR, and s/s of bleeding   Vinnie LevelBenjamin Shawnelle Spoerl, PharmD.  Clinical Pharmacist Pager [phone]

## 2014-02-16 NOTE — Progress Notes (Signed)
Patient evaluated for community based chronic disease management services with [facility] as a benefit of patient's [employer]. Spoke with patient at bedside to explain [facility].  80 y.o. male with a past medical history for significant for CHF, afib, COPD on home oxygen, CVA, type 2 diabetes, HTN, PVD, status post recent atherectomy who [facility] with complaints of SOB and LE swelling.  [facility]  Patient will receive a post discharge transition of care call and will be evaluated for monthly home visits for assessments and disease process education.  Patient agrees to his wife being the primary contact in the home.  Virgel PalingMarlene Moorehouse [phone].  Left contact information and [facility] literature at bedside. Made Inpatient Case Manager aware that [facility] Care Management following. Of note, [facility] Care Management services does not replace or interfere with any services that are arranged by inpatient case management or social work.  For additional questions or referrals please contact Anibal Hendersonim Henderson BSN RN [facility] Hospital Liaison at [phone].

## 2014-02-16 NOTE — Progress Notes (Signed)
Inpatient Diabetes Program Recommendations  AACE/ADA: New Consensus Statement on Inpatient Glycemic Control (2013)  Target Ranges:  Prepandial:   less than 140 mg/dL      Peak postprandial:   less than 180 mg/dL (1-2 hours)      Critically ill patients:  140 - 180 mg/dL   Reason for Visit: Hypoglycemia  Results for [name] [name], [name] [name] (MRN [id number]) as of 02/16/2014 13:29  Ref. Range 02/15/2014 21:13 02/16/2014 05:53 02/16/2014 06:24 02/16/2014 09:03 02/16/2014 11:29  Glucose-Capillary Latest Range: 70-99 mg/dL 829203 (H) 56 (L) 562107 (H) 114 (H) 98    Diabetes history: Type 2 Outpatient Diabetes medications: Amaryl 1 mg QD and tradjenta 5 mg QD Current orders for Inpatient glycemic control: Novolog sensitive tidwc and Amaryl 1 mg QD  Inpatient Diabetes Program Recommendations Oral Agents: D/C Amaryl  while in hospital.  Note: Please add CHO mod medium to heart healthy diet.  Thank you. Ailene Ardshonda Mercadez Heitman, RD, LDN, CDE Inpatient Diabetes Coordinator [phone]

## 2014-02-16 NOTE — Progress Notes (Signed)
Pt resting comfortable on bed, VSS, no distress noticed. We'll continue to monitor.

## 2014-02-17 LAB — BASIC METABOLIC PANEL
BUN: 16 mg/dL (ref 6–23)
CHLORIDE: 97 meq/L (ref 96–112)
CO2: 35 meq/L — AB (ref 19–32)
Calcium: 9.9 mg/dL (ref 8.4–10.5)
Creatinine, Ser: 1 mg/dL (ref 0.50–1.35)
GFR calc Af Amer: 79 mL/min — ABNORMAL LOW (ref >90)
GFR calc non Af Amer: 68 mL/min — ABNORMAL LOW (ref >90)
GLUCOSE: 72 mg/dL (ref 70–99)
POTASSIUM: 3.9 meq/L (ref 3.7–5.3)
SODIUM: 142 meq/L (ref 137–147)

## 2014-02-17 LAB — PROTIME-INR
INR: 2.32 — AB (ref 0.00–1.49)
Prothrombin Time: 24.7 seconds — ABNORMAL HIGH (ref 11.6–15.2)

## 2014-02-17 LAB — GLUCOSE, CAPILLARY
GLUCOSE-CAPILLARY: 107 mg/dL — AB (ref 70–99)
GLUCOSE-CAPILLARY: 133 mg/dL — AB (ref 70–99)
Glucose-Capillary: 74 mg/dL (ref 70–99)
Glucose-Capillary: 77 mg/dL (ref 70–99)

## 2014-02-17 MED ORDER — WARFARIN SODIUM 4 MG PO TABS
4.0000 mg | ORAL_TABLET | Freq: Once | ORAL | Status: AC
  Administered 2014-02-17: 4 mg via ORAL
  Filled 2014-02-17: qty 1

## 2014-02-17 MED ORDER — FUROSEMIDE 40 MG PO TABS
40.0000 mg | ORAL_TABLET | Freq: Two times a day (BID) | ORAL | Status: DC
  Administered 2014-02-17 – 2014-02-18 (×3): 40 mg via ORAL
  Filled 2014-02-17 (×6): qty 1

## 2014-02-17 NOTE — Evaluation (Signed)
Physical Therapy Evaluation Patient Details Name: [name] GoodyBobby [name] MRN: [id number] DOB: 1931/04/17 Today's Date: 02/17/2014 Time: [id number] PT Time Calculation (min): 24 min  PT Assessment / Plan / Recommendation History of Present Illness    80 y.o. male w/ PMHx significant for CHF with nl EF, afib, COPD on home 02, h/o CVA, Dm2, HTN, PVD s/p recent atherectomy who [facility] with complaints of SOB and LE swelling.      Clinical Impression  Pt adm due to the above. Presents with decline in functional mobility secondary to deficits indicated below. Pt to benefit form skilled acute PT to address deficits indicated below (see PT problem list). Pt greatly limited during evaluation due to fatigue and weakness. Pt with Lt sided weakness secondary to previous CVA. Pt and wife agreeable at this time to SNF for post acute rehab due to overall deconditioning and (A) level needed at this time.     PT Assessment  Patient needs continued PT services    Follow Up Recommendations  SNF    Does the patient have the potential to tolerate intense rehabilitation      Barriers to Discharge Decreased caregiver support wife able to provide limited physical (A)     Equipment Recommendations  None recommended by PT    Recommendations for Other Services OT consult   Frequency Min 3X/week    Precautions / Restrictions Precautions Precautions: Fall Precaution Comments: pt has Lt foot drop; Lt hemiparesis due to previous CVA Restrictions Weight Bearing Restrictions: No   Pertinent Vitals/Pain On 2L O2; SOB with activity. No c/o pain at this time.       Mobility  Bed Mobility Overal bed mobility: Needs Assistance Bed Mobility: Supine to Sit;Sit to Supine Supine to sit: Mod assist;HOB elevated Sit to supine: Mod assist General bed mobility comments: pt requires (A) to come to sitting position to EOB; bed mobiltiy performed to Lt which was increasingly difficult  secondary to Lt sided weakness; bed mobility very effortful for pt; (A) to elevate trunk and bring Lt LE off bed  Transfers Overall transfer level: Needs assistance Equipment used: Rolling walker (2 wheeled) Transfers: Sit to/from Stand Sit to Stand: Mod assist General transfer comment: performed sit to stand x2 with mod (A); pt with difficulty with transfer due to Lt LE weakness; (A) on lt side to clear hips; unable to maintain standing >30 secs secondary to fatigue; max cues for sequencing and hand placement  Ambulation/Gait Ambulation/Gait assistance: Mod assist Ambulation Distance (Feet): 2 Feet (lateral) Assistive device: Rolling walker (2 wheeled) Gait Pattern/deviations:  (lateral steps to [facility] ) Gait velocity: very decreased Gait velocity interpretation: Below normal speed for age/gender General Gait Details: pt limited to steps to [facility]; fwd flexed posture throughout session; pt with lt LE buckling; pt wearing Lt [name] but unable to increase ambulation due to fatigue; max cues for sequencing and upright posture          PT Diagnosis: Difficulty walking;Generalized weakness  PT Problem List: Decreased strength;Decreased balance;Decreased activity tolerance;Decreased mobility;Decreased knowledge of use of DME;Decreased safety awareness;Impaired sensation PT Treatment Interventions: DME instruction;Gait training;Functional mobility training;Therapeutic activities;Therapeutic exercise;Balance training;Neuromuscular re-education;Patient/family education     PT Goals(Current goals can be found in the care plan section) Acute Rehab PT Goals Patient Stated Goal: to get stronger like i was before i came in here PT Goal Formulation: With patient Time For Goal Achievement: 03/03/14 Potential to Achieve Goals: Good  Visit Information  Last PT Received On:  02/17/14 Assistance Needed: +1       Prior Functioning  Home Living Family/patient expects to be discharged to:: Private  residence Living Arrangements: Spouse/significant other Available Help at Discharge: Family Type of Home: House Home Access: Stairs to enter Secretary/administrator of Steps: 5 Entrance Stairs-Rails: Right Home Layout: One level Home Equipment: Environmental consultant - 2 wheels;Tub bench;Bedside commode;Wheelchair - manual Prior Function Level of Independence: Needs assistance Gait / Transfers Assistance Needed: used rollator ADL's / Homemaking Assistance Needed: assist for dressing and bathing; primarily with back  Communication Communication: No difficulties Dominant Hand: Right    Cognition  Cognition Arousal/Alertness: Awake/alert Behavior During Therapy: WFL for tasks assessed/performed Overall Cognitive Status: Within Functional Limits for tasks assessed    Extremity/Trunk Assessment Upper Extremity Assessment Upper Extremity Assessment: Defer to OT evaluation (Lt UE weakness ) Lower Extremity Assessment Lower Extremity Assessment: LLE deficits/detail LLE Deficits / Details: Lt hemiparesis secondary to previous CVA; Ankle 2/5; knee 2+/5 Cervical / Trunk Assessment Cervical / Trunk Assessment: Kyphotic   Balance Balance Overall balance assessment: Needs assistance;History of Falls Sitting-balance support: Feet supported;Single extremity supported Sitting balance-Leahy Scale: Poor Sitting balance - Comments: pt with lean anteriorly and to Lt throughout session; (A) at times to ensure pt was not falling out of the bed; pt very unsteady sitting EOB  Postural control: Left lateral lean;Other (comment) (anterior lean) Standing balance support: During functional activity;Bilateral upper extremity supported Standing balance-Leahy Scale: Poor Standing balance comment: pt very unsetady with standing and demo decreased activity tolerance; unable to stand >30 seconds due to fatigue and Lt LE weakness; bil UEs supported and (A) to maintain balance  General Comments General comments (skin integrity,  edema, etc.): on 2L O2; (A) to donn socks shoes and Lt [name]  End of Session PT - End of Session Activity Tolerance: Patient limited by fatigue Patient left: in bed;with call bell/phone within reach;with family/visitor present Nurse Communication: Mobility status  GP     Donell Sievert, Henderson [phone] 02/17/2014, 1:55 PM

## 2014-02-17 NOTE — Progress Notes (Signed)
Subjective: Feels like he is at baseline.  Objective: Vital signs in last 24 hours: Temp:  [97.9 F (36.6 C)-98 F (36.7 C)] 98 F (36.7 C) (03/07 0557) Pulse Rate:  [77-87] 85 (03/07 0557) Resp:  [18-20] 20 (03/07 0557) BP: (86-122)/(54-61) 86/60 mmHg (03/07 0557) SpO2:  [94 %-99 %] 96 % (03/07 0557) Weight:  [183 lb 4.8 oz (83.144 kg)] 183 lb 4.8 oz (83.144 kg) (03/07 0500) Last BM Date: 02/16/14  Intake/Output from previous day: 03/06 0701 - 03/07 0700 In: 721 [P.O.:718; I.V.:3] Out: 6100 [Urine:6100] Intake/Output this shift:    Medications Current Facility-Administered Medications  Medication Dose Route Frequency Provider Last Rate Last Dose  . 0.9 %  sodium chloride infusion  250 mL Intravenous PRN Ardis RowanMatthew Whitlock, MD      . albuterol (PROVENTIL) (2.5 MG/3ML) 0.083% nebulizer solution 2.5 mg  2.5 mg Nebulization Q6H PRN Ardis RowanMatthew Whitlock, MD      . atorvastatin (LIPITOR) tablet 10 mg  10 mg Oral q1800 Ardis RowanMatthew Whitlock, MD   10 mg at 02/16/14 1738  . diltiazem (CARDIZEM CD) 24 hr capsule 240 mg  240 mg Oral Daily Ardis RowanMatthew Whitlock, MD   240 mg at 02/16/14 [id number]  . famotidine (PEPCID) tablet 20 mg  20 mg Oral BID Ardis RowanMatthew Whitlock, MD   20 mg at 02/16/14 2208  . furosemide (LASIX) injection 80 mg  80 mg Intravenous BID Donato SchultzMark Skains, MD   80 mg at 02/16/14 1738  . glimepiride (AMARYL) tablet 1 mg  1 mg Oral Q breakfast Ardis RowanMatthew Whitlock, MD   1 mg at 02/15/14 1038  . insulin aspart (novoLOG) injection 0-15 Units  0-15 Units Subcutaneous TID WC Ardis RowanMatthew Whitlock, MD   1 Units at 02/15/14 1730  . sodium chloride 0.9 % injection 3 mL  3 mL Intravenous Q12H Ardis RowanMatthew Whitlock, MD   3 mL at 02/16/14 2208  . sodium chloride 0.9 % injection 3 mL  3 mL Intravenous PRN Ardis RowanMatthew Whitlock, MD      . warfarin (COUMADIN) tablet 4 mg  4 mg Oral ONCE-1800 Kaylyn LayerNathan E Cope, RPH      . Warfarin - Pharmacist Dosing Inpatient   Does not apply q1800 Colleen CanVeronda Pauline Bryk, [facility]        PE: General  appearance: alert, cooperative and no distress Neck: no JVD Lungs: Bilateral wheeze. Heart: irregularly irregular rhythm and 2/6 sys MM Abdomen: +BS, Nontender Extremities: Trace LEE Pulses: 2+ and symmetric Skin: Warm and dry Neurologic: right sided weakness, chronic  Lab Results:   Recent Labs  02/14/14 1531  WBC 8.7  HGB 11.6*  HCT 36.6*  PLT 267   BMET  Recent Labs  02/15/14 0500 02/16/14 0331 02/17/14 0530  NA 147 143 142  K 2.9* 3.7 3.9  CL 101 99 97  CO2 28 33* 35*  GLUCOSE 69* 57* 72  BUN 14 16 16   CREATININE 0.81 1.06 1.00  CALCIUM 9.8 9.8 9.9   PT/INR  Recent Labs  02/15/14 0500 02/16/14 0331 02/17/14 0530  LABPROT 19.9* 23.7* 24.7*  INR 1.75* 2.20* 2.32*   Cholesterol No results found for this basename: CHOL,  in the last 72 hours Cardiac Enzymes No components found with this basename: TROPONIN,  CKMB,   Studies/Results: @RISRSLT2 @   Assessment/Plan   Principal Problem:   Acute on chronic diastolic CHF (congestive heart failure) Active Problems:   Permanent atrial fibrillation   H/O: CVA (cerebrovascular accident)   COPD (chronic obstructive pulmonary disease)   DM type  2, controlled, with complication   PVD- s/p Rt fem embolectomy 12/14   DVT Rt leg Jan 2015   Chronic diastolic heart failure   Chronic anticoagulation  Plan: Net fluids:  -5.4L/-7.7L.  He had a lot of diuresis in the last 24 hours!  SCr stable.   INR therapeutic.  Wt decreased from 189 yesterday to 183# today.  Change to PO lasix.  Hold this morning due to hypotension.  Ambulate.  PT eval.  monitor SCr.  DC home next 24-48hrs.    LOS: 3 days    HAGER, BRYAN 02/17/2014 8:31 AM  Patient examined chart reviewed.  Has not been OOB and still on iv diuretics Change to PO  Have PT ambulate Likely d/c on Monday   Charlton Haws

## 2014-02-17 NOTE — Progress Notes (Signed)
ANTICOAGULATION CONSULT NOTE - Follow up Consult  Pharmacy Consult for Coumadin Indication: atrial fibrillation  No Known Allergies  Patient Measurements: Height: 5\' 7"  (170.2 cm) Weight: 183 lb 4.8 oz (83.144 kg) (bed scale) IBW/kg (Calculated) : 66.1  Vital Signs: Temp: 98 F (36.7 C) (03/07 0557) Temp src: Oral (03/07 0557) BP: 86/60 mmHg (03/07 0557) Pulse Rate: 85 (03/07 0557)  Labs:  Recent Labs  02/14/14 1531 02/15/14 0500 02/16/14 0331 02/17/14 0530  HGB 11.6*  --   --   --   HCT 36.6*  --   --   --   PLT 267  --   --   --   LABPROT 25.4* 19.9* 23.7* 24.7*  INR 2.40* 1.75* 2.20* 2.32*  CREATININE 0.87 0.81 1.06 1.00  TROPONINI <0.30  --   --   --     Estimated Creatinine Clearance: 59.7 ml/min (by C-G formula based on Cr of 1).   Assessment: 80 yo male admitted with SOB and LE swelling.  EKG revealed Afib and CXR with fluid overload/effusions.  Patient admitted for HF decompensation.  Pharmacy consulted to continue PTA Coumadin for Afib.  INR is therapeutic today at 2.32 after 3/5 boost dose and then resumption of PTA regimen. No reported signs and symptoms of bleeding.   Goal of Therapy:  INR 2-3   Plan:  - continue warfarin PO 4 mg x1 dose tonight, and if discharged, would recommend to resume PTA regimen of 4mg  daily - daily INR - monitor for s/s of bleeding   Harrold DonathNathan E. Achilles Dunkope, PharmD Clinical Pharmacist - Resident Pager: [phone] Pharmacy: [phone] 02/17/2014 7:44 AM

## 2014-02-18 LAB — GLUCOSE, CAPILLARY
Glucose-Capillary: 90 mg/dL (ref 70–99)
Glucose-Capillary: 185 mg/dL — ABNORMAL HIGH (ref 70–99)
Glucose-Capillary: 162 mg/dL — ABNORMAL HIGH (ref 70–99)
Glucose-Capillary: 124 mg/dL — ABNORMAL HIGH (ref 70–99)

## 2014-02-18 LAB — PROTIME-INR
INR: 2.2 — ABNORMAL HIGH (ref 0.00–1.49)
Prothrombin Time: 23.7 seconds — ABNORMAL HIGH (ref 11.6–15.2)

## 2014-02-18 MED ORDER — WARFARIN SODIUM 4 MG PO TABS
4.0000 mg | ORAL_TABLET | Freq: Once | ORAL | Status: AC
  Administered 2014-02-18: 4 mg via ORAL
  Filled 2014-02-18: qty 1

## 2014-02-18 NOTE — Progress Notes (Signed)
Subjective: Feels weak.  No orthpnea  Objective: Vital signs in last 24 hours: Temp:  [97.4 F (36.3 C)-98.1 F (36.7 C)] 97.5 F (36.4 C) (03/08 0618) Pulse Rate:  [69-100] 69 (03/08 0618) Resp:  [18-22] 22 (03/08 0618) BP: (100-122)/(51-59) 122/59 mmHg (03/08 0618) SpO2:  [94 %-97 %] 95 % (03/08 0618) Weight:  [178 lb 3.2 oz (80.831 kg)] 178 lb 3.2 oz (80.831 kg) (03/08 0618) Last BM Date: 02/16/14  Intake/Output from previous day: 03/07 0701 - 03/08 0700 In: 560 [P.O.:560] Out: 1025 [Urine:1025] Intake/Output this shift:    Medications Current Facility-Administered Medications  Medication Dose Route Frequency Provider Last Rate Last Dose  . 0.9 %  sodium chloride infusion  250 mL Intravenous PRN Ardis Rowan, MD      . albuterol (PROVENTIL) (2.5 MG/3ML) 0.083% nebulizer solution 2.5 mg  2.5 mg Nebulization Q6H PRN Ardis Rowan, MD   2.5 mg at 02/17/14 1610  . atorvastatin (LIPITOR) tablet 10 mg  10 mg Oral q1800 Ardis Rowan, MD   10 mg at 02/17/14 1800  . diltiazem (CARDIZEM CD) 24 hr capsule 240 mg  240 mg Oral Daily Ardis Rowan, MD   240 mg at 02/17/14 0904  . famotidine (PEPCID) tablet 20 mg  20 mg Oral BID Ardis Rowan, MD   20 mg at 02/17/14 2209  . furosemide (LASIX) tablet 40 mg  40 mg Oral BID Wilburt Finlay, PA-C   40 mg at 02/17/14 1727  . glimepiride (AMARYL) tablet 1 mg  1 mg Oral Q breakfast Ardis Rowan, MD   1 mg at 02/15/14 1038  . insulin aspart (novoLOG) injection 0-15 Units  0-15 Units Subcutaneous TID WC Ardis Rowan, MD      . sodium chloride 0.9 % injection 3 mL  3 mL Intravenous Q12H Ardis Rowan, MD   3 mL at 02/17/14 2210  . sodium chloride 0.9 % injection 3 mL  3 mL Intravenous PRN Ardis Rowan, MD      . Warfarin - Pharmacist Dosing Inpatient   Does not apply q1800 Colleen Can, [facility]        PE: General appearance: alert, cooperative and no distress  Neck: no JVD  Lungs: Bilateral wheeze.  Heart:  irregularly irregular rhythm and 2/6 sys MM  Extremities: No LEE  Pulses: 2+ and symmetric  Skin: Warm and dry  Neurologic: right sided weakness, chronic   Lab Results:  No results found for this basename: WBC, HGB, HCT, PLT,  in the last 72 hours BMET  Recent Labs  02/16/14 0331 02/17/14 0530  NA 143 142  K 3.7 3.9  CL 99 97  CO2 33* 35*  GLUCOSE 57* 72  BUN 16 16  CREATININE 1.06 1.00  CALCIUM 9.8 9.9   PT/INR  Recent Labs  02/16/14 0331 02/17/14 0530 02/18/14 0510  LABPROT 23.7* 24.7* 23.7*  INR 2.20* 2.32* 2.20*     Assessment/Plan  Principal Problem:   Acute on chronic diastolic CHF (congestive heart failure) Active Problems:   Permanent atrial fibrillation   H/O: CVA (cerebrovascular accident)   COPD (chronic obstructive pulmonary disease)   DM type 2, controlled, with complication   PVD- s/p Rt fem embolectomy 12/14   DVT Rt leg Jan 2015   Chronic diastolic heart failure   Chronic anticoagulation  Plan:  Net fluids: -05L/-8.2L.  SCr stable. INR therapeutic. Wt decreased from 191(Adm) to 178# today.  PO lasix. Hold this morning due to hypotension. Ambulate. PT eval. Recommended SNF  placement.  The patient seems to be ok with this.  Will ask SW to arrange.  INR therapeutic.  BP and HR stable.  BMET tomorrow.   LOS: 4 days    HAGER, BRYAN 02/18/2014 7:13 AM  Agree with need for SNF.  Follow BP and Cr  Significant weight loss 13 lbs  Hold lasix today  Charlton HawsPeter Kionte Baumgardner

## 2014-02-18 NOTE — Progress Notes (Signed)
ANTICOAGULATION CONSULT NOTE - Follow up Consult  Pharmacy Consult for Coumadin Indication: atrial fibrillation  No Known Allergies  Patient Measurements: Height: 5\' 7"  (170.2 cm) Weight: 178 lb 3.2 oz (80.831 kg) IBW/kg (Calculated) : 66.1  Vital Signs: Temp: 97.5 F (36.4 C) (03/08 0618) Temp src: Oral (03/08 0618) BP: 122/59 mmHg (03/08 0618) Pulse Rate: 69 (03/08 0618)  Labs:  Recent Labs  02/16/14 0331 02/17/14 0530 02/18/14 0510  LABPROT 23.7* 24.7* 23.7*  INR 2.20* 2.32* 2.20*  CREATININE 1.06 1.00  --     Estimated Creatinine Clearance: 59 ml/min (by C-G formula based on Cr of 1).   Assessment: 80 yo male admitted with SOB and LE swelling.  EKG revealed Afib and CXR with fluid overload/effusions.  Patient admitted for HF decompensation.  Pharmacy consulted to continue PTA Coumadin for Afib.  INR remains therapeutic today at 2.2 after 3/5 boost dose and then resumption of PTA regimen. No reported signs and symptoms of bleeding.   Goal of Therapy:  INR 2-3   Plan:  - continue warfarin PO 4 mg x1 dose tonight, and if discharged, would recommend to resume PTA regimen of 4mg  daily - daily INR - monitor for s/s of bleeding   Harrold DonathNathan E. Achilles Dunkope, PharmD Clinical Pharmacist - Resident Pager: [phone] Pharmacy: [phone] 02/18/2014 7:20 AM

## 2014-02-18 NOTE — Progress Notes (Signed)
Patient rested quietly last night. No complaints.

## 2014-02-19 DIAGNOSIS — E876 Hypokalemia: Secondary | ICD-10-CM

## 2014-02-19 DIAGNOSIS — I10 Essential (primary) hypertension: Secondary | ICD-10-CM

## 2014-02-19 LAB — GLUCOSE, CAPILLARY
GLUCOSE-CAPILLARY: 88 mg/dL (ref 70–99)
GLUCOSE-CAPILLARY: 82 mg/dL (ref 70–99)
GLUCOSE-CAPILLARY: 110 mg/dL — AB (ref 70–99)
GLUCOSE-CAPILLARY: 146 mg/dL — AB (ref 70–99)

## 2014-02-19 LAB — BASIC METABOLIC PANEL
BUN: 19 mg/dL (ref 6–23)
CO2: 32 meq/L (ref 19–32)
Calcium: 10.5 mg/dL (ref 8.4–10.5)
Chloride: 95 mEq/L — ABNORMAL LOW (ref 96–112)
Creatinine, Ser: 1.1 mg/dL (ref 0.50–1.35)
GFR calc non Af Amer: 61 mL/min — ABNORMAL LOW (ref >90)
GFR, EST AFRICAN AMERICAN: 71 mL/min — AB (ref >90)
GLUCOSE: 73 mg/dL (ref 70–99)
POTASSIUM: 3.6 meq/L — AB (ref 3.7–5.3)
Sodium: 140 mEq/L (ref 137–147)

## 2014-02-19 LAB — PROTIME-INR
INR: 1.9 — ABNORMAL HIGH (ref 0.00–1.49)
PROTHROMBIN TIME: 21.2 s — AB (ref 11.6–15.2)

## 2014-02-19 MED ORDER — FUROSEMIDE 80 MG PO TABS: 80.0000 mg | ORAL_TABLET | Freq: Every day | ORAL | Status: AC

## 2014-02-19 MED ORDER — FUROSEMIDE 80 MG PO TABS
80.0000 mg | ORAL_TABLET | Freq: Every day | ORAL | Status: DC
  Administered 2014-02-19 – 2014-02-20 (×2): 80 mg via ORAL
  Filled 2014-02-19 (×2): qty 1

## 2014-02-19 MED ORDER — WARFARIN SODIUM 6 MG PO TABS
6.0000 mg | ORAL_TABLET | Freq: Once | ORAL | Status: AC
  Administered 2014-02-19: 6 mg via ORAL
  Filled 2014-02-19: qty 1

## 2014-02-19 MED ORDER — POTASSIUM CHLORIDE CRYS ER 20 MEQ PO TBCR
40.0000 meq | EXTENDED_RELEASE_TABLET | Freq: Once | ORAL | Status: AC
  Administered 2014-02-19: 40 meq via ORAL
  Filled 2014-02-19: qty 2

## 2014-02-19 MED ORDER — POTASSIUM CHLORIDE CRYS ER 20 MEQ PO TBCR
20.0000 meq | EXTENDED_RELEASE_TABLET | Freq: Every day | ORAL | Status: DC
  Administered 2014-02-20: 20 meq via ORAL
  Filled 2014-02-19: qty 1

## 2014-02-19 NOTE — Care Management Note (Addendum)
  Page 2 of 2   02/19/2014     3:15:34 PM   CARE MANAGEMENT NOTE 02/19/2014  Patient:  [name] [name],[name] [name]   Account Number:  [id number]  Date Initiated:  02/19/2014  Documentation initiated by:  Martika Egler  Subjective/Objective Assessment:   admitted with fall, CHF     Action/Plan:   CM to follow for dispositon needs   Anticipated DC Date:  02/19/2014   Anticipated DC Plan:  HOME W HOME HEALTH SERVICES  In-house referral  Clinical Social Worker      DC Planning Services  CM consult      [facility]  [facility]   Choice offered to / List presented to:  NA           Status of service:  Completed, signed off Medicare Important Message given?   (If response is "NO", the following Medicare IM given date fields will be blank) Date Medicare IM given:   Date Additional Medicare IM given:    Discharge Disposition:  SKILLED NURSING FACILITY  Per UR Regulation:  Reviewed for med. necessity/level of care/duration of stay  If discussed at Long Length of Stay Meetings, dates discussed:   02/20/2014    Comments:  02/19/2014 CM Consult:  Patient from home. PT Recs:  SNF - Patient and wife agrees to SNF and interested in [facility] or [facility] in [place].  Patient has used [facility] in the past.  (SW Lovette ClicheDonna Crowder notifed) Home DME:  Home O2 Disposition:  SNF pending. Manuel Dall RN, BSN, [place]HL, [place]CCM 02/19/2014

## 2014-02-19 NOTE — Progress Notes (Signed)
ANTICOAGULATION CONSULT NOTE   Pharmacy Consult for Coumadin Indication: atrial fibrillation  No Known Allergies  Patient Measurements: Height: 5\' 7"  (170.2 cm) Weight: 166 lb 10.7 oz (75.6 kg) IBW/kg (Calculated) : 66.1  Vital Signs: Temp: 97.5 F (36.4 C) (03/09 0500) Temp src: Oral (03/09 0500) BP: 145/54 mmHg (03/09 0500) Pulse Rate: 79 (03/09 0500)  Labs:  Recent Labs  02/17/14 0530 02/18/14 0510 02/19/14 0526  LABPROT 24.7* 23.7* 21.2*  INR 2.32* 2.20* 1.90*  CREATININE 1.00  --  1.10    Estimated Creatinine Clearance: 49.2 ml/min (by C-G formula based on Cr of 1.1).   Assessment: 80 yo male admitted with SOB and LE swelling.  EKG revealed Afib and CXR with fluid overload/effusions.  Patient admitted for HF decompensation.  Pharmacy consulted to continue PTA Coumadin for Afib.  INR dropped to just below goal today at 1.9. No reported signs and symptoms of bleeding.   Goal of Therapy:  INR 2-3   Plan:  - continue warfarin PO 6 mg x1 dose tonight, then if discharged, would recommend to resume PTA regimen of 4mg  daily - daily INR - monitor for s/s of bleeding   Sheppard CoilFrank Wilson PharmD., BCPS Clinical Pharmacist Pager [phone] 02/19/2014 8:22 AM

## 2014-02-19 NOTE — Discharge Instructions (Signed)
Information on my medicine - Coumadin®   (Warfarin) ° °This medication education was reviewed with me or my healthcare representative as part of my discharge preparation.  The pharmacist that spoke with me during my hospital stay was:  Nahzir Pohle G, RPH ° °Why was Coumadin prescribed for you? °Coumadin was prescribed for you because you have a blood clot or a medical condition that can cause an increased risk of forming blood clots. Blood clots can cause serious health problems by blocking the flow of blood to the heart, lung, or brain. Coumadin can prevent harmful blood clots from forming. °As a reminder your indication for Coumadin is:   Stroke Prevention Because Of Atrial Fibrillation ° °What test will check on my response to Coumadin? °While on Coumadin (warfarin) you will need to have an INR test regularly to ensure that your dose is keeping you in the desired range. The INR (international normalized ratio) number is calculated from the result of the laboratory test called prothrombin time (PT). ° °If an INR APPOINTMENT HAS NOT ALREADY BEEN MADE FOR YOU please schedule an appointment to have this lab work done by your health care provider within 7 days. °Your INR goal is usually a number between:  2 to 3 or your provider may give you a more narrow range like 2-2.5.  Ask your health care provider during an office visit what your goal INR is. ° °What  do you need to  know  About  COUMADIN? °Take Coumadin (warfarin) exactly as prescribed by your healthcare provider about the same time each day.  DO NOT stop taking without talking to the doctor who prescribed the medication.  Stopping without other blood clot prevention medication to take the place of Coumadin may increase your risk of developing a new clot or stroke.  Get refills before you run out. ° °What do you do if you miss a dose? °If you miss a dose, take it as soon as you remember on the same day then continue your regularly scheduled regimen the  next day.  Do not take two doses of Coumadin at the same time. ° °Important Safety Information °A possible side effect of Coumadin (Warfarin) is an increased risk of bleeding. You should call your healthcare provider right away if you experience any of the following: °? Bleeding from an injury or your nose that does not stop. °? Unusual colored urine (red or dark brown) or unusual colored stools (red or black). °? Unusual bruising for unknown reasons. °? A serious fall or if you hit your head (even if there is no bleeding). ° °Some foods or medicines interact with Coumadin® (warfarin) and might alter your response to warfarin. To help avoid this: °? Eat a balanced diet, maintaining a consistent amount of Vitamin K. °? Notify your provider about major diet changes you plan to make. °? Avoid alcohol or limit your intake to 1 drink for women and 2 drinks for men per day. °(1 drink is 5 oz. wine, 12 oz. beer, or 1.5 oz. liquor.) ° °Make sure that ANY health care provider who prescribes medication for you knows that you are taking Coumadin (warfarin).  Also make sure the healthcare provider who is monitoring your Coumadin knows when you have started a new medication including herbals and non-prescription products. ° °Coumadin® (Warfarin)  Major Drug Interactions  °Increased Warfarin Effect Decreased Warfarin Effect  °Alcohol (large quantities) °Antibiotics (esp. Septra/Bactrim, Flagyl, Cipro) °Amiodarone (Cordarone) °Aspirin (ASA) °Cimetidine (Tagamet) °Megestrol (Megace) °NSAIDs (ibuprofen,   ASA) Cimetidine (Tagamet) Megestrol (Megace) NSAIDs (ibuprofen, naproxen, etc.) Piroxicam (Feldene) Propafenone (Rythmol SR) Propranolol (Inderal) Isoniazid (INH) Posaconazole (Noxafil) Barbiturates (Phenobarbital) Carbamazepine (Tegretol) Chlordiazepoxide (Librium) Cholestyramine (Questran) Griseofulvin Oral Contraceptives Rifampin Sucralfate (Carafate) Vitamin K   Coumadin (Warfarin) Major Herbal Interactions  Increased Warfarin Effect Decreased Warfarin Effect   Garlic Ginseng Ginkgo biloba Coenzyme Q10 Green tea St. Johns wort    Coumadin (Warfarin) FOOD Interactions  Eat a consistent number of servings per week of foods HIGH in Vitamin K (1 serving =  cup)  Collards (cooked, or boiled & drained) Kale (cooked, or boiled & drained) Mustard greens (cooked, or boiled & drained) Parsley *serving size only =  cup Spinach (cooked, or boiled & drained) Swiss chard (cooked, or boiled & drained) Turnip greens (cooked, or boiled & drained)  Eat a consistent number of servings per week of foods MEDIUM-HIGH in Vitamin K (1 serving = 1 cup)  Asparagus (cooked, or boiled & drained) Broccoli (cooked, boiled & drained, or raw & chopped) Brussel sprouts (cooked, or boiled & drained) *serving size only =  cup Lettuce, raw (green leaf, endive, romaine) Spinach, raw Turnip greens, raw & chopped   These websites have more information on Coumadin (warfarin):  http://www.king-russell.com/www.coumadin.com; https://www.hines.net/www.ahrq.gov/consumer/coumadin.htm;   Heart Failure Heart failure is a condition in which the heart has trouble pumping blood. This means your heart does not pump blood efficiently for your body to work well. In some cases of heart failure, fluid may back up into your lungs or you may have swelling (edema) in your lower legs. Heart failure is usually a long-term (chronic) condition. It is important for you to take good care of yourself and follow your caregiver's treatment plan. CAUSES  Some health conditions can cause heart failure. Those health conditions include:  High blood pressure (hypertension) causes the heart muscle to work harder than normal. When pressure in the blood vessels is high, the heart needs to pump (contract) with more force in order to circulate blood throughout the body. High blood pressure eventually causes the heart to become stiff and weak.  Coronary artery disease (CAD) is the buildup of cholesterol and fat (plaque) in the arteries of the heart. The  blockage in the arteries deprives the heart muscle of oxygen and blood. This can cause chest pain and may lead to a heart attack. High blood pressure can also contribute to CAD.  Heart attack (myocardial infarction) occurs when 1 or more arteries in the heart become blocked. The loss of oxygen damages the muscle tissue of the heart. When this happens, part of the heart muscle dies. The injured tissue does not contract as well and weakens the heart's ability to pump blood.  Abnormal heart valves can cause heart failure when the heart valves do not open and close properly. This makes the heart muscle pump harder to keep the blood flowing.  Heart muscle disease (cardiomyopathy or myocarditis) is damage to the heart muscle from a variety of causes. These can include drug or alcohol abuse, infections, or unknown reasons. These can increase the risk of heart failure.  Lung disease makes the heart work harder because the lungs do not work properly. This can cause a strain on the heart, leading it to fail.  Diabetes increases the risk of heart failure. High blood sugar contributes to high fat (lipid) levels in the blood. Diabetes can also cause slow damage to tiny blood vessels that carry important nutrients to the heart muscle. When the heart does not get enough  oxygen and food, it can cause the heart to become weak and stiff. This leads to a heart that does not contract efficiently.  Other conditions can contribute to heart failure. These include abnormal heart rhythms, thyroid problems, and low blood counts (anemia). Certain unhealthy behaviors can increase the risk of heart failure. Those unhealthy behaviors include:  Being overweight.  Smoking or chewing tobacco.  Eating foods high in fat and cholesterol.  Abusing illicit drugs or alcohol.  Lacking physical activity. SYMPTOMS  Heart failure symptoms may vary and can be hard to detect. Symptoms may include:  Shortness of breath with activity,  such as climbing stairs.  Persistent cough.  Swelling of the feet, ankles, legs, or abdomen.  Unexplained weight gain.  Difficulty breathing when lying flat (orthopnea).  Waking from sleep because of the need to sit up and get more air.  Rapid heartbeat.  Fatigue and loss of energy.  Feeling lightheaded, dizzy, or close to fainting.  Loss of appetite.  Nausea.  Increased urination during the night (nocturia). DIAGNOSIS  A diagnosis of heart failure is based on your history, symptoms, physical examination, and diagnostic tests. Diagnostic tests for heart failure may include:  Echocardiography.  Electrocardiography.  Chest X-ray.  Blood tests.  Exercise stress test.  Cardiac angiography.  Radionuclide scans. TREATMENT  Treatment is aimed at managing the symptoms of heart failure. Medicines, behavioral changes, or surgical intervention may be necessary to treat heart failure.  Medicines to help treat heart failure may include:  Angiotensin-converting enzyme (ACE) inhibitors. This type of medicine blocks the effects of a blood protein called angiotensin-converting enzyme. ACE inhibitors relax (dilate) the blood vessels and help lower blood pressure.  Angiotensin receptor blockers. This type of medicine blocks the actions of a blood protein called angiotensin. Angiotensin receptor blockers dilate the blood vessels and help lower blood pressure.  Water pills (diuretics). Diuretics cause the kidneys to remove salt and water from the blood. The extra fluid is removed through urination. This loss of extra fluid lowers the volume of blood the heart pumps.  Beta blockers. These prevent the heart from beating too fast and improve heart muscle strength.  Digitalis. This increases the force of the heartbeat.  Healthy behavior changes include:  Obtaining and maintaining a healthy weight.  Stopping smoking or chewing tobacco.  Eating heart healthy foods.  Limiting or  avoiding alcohol.  Stopping illicit drug use.  Physical activity as directed by your caregiver.  Surgical treatment for heart failure may include:  A procedure to open blocked arteries, repair damaged heart valves, or remove damaged heart muscle tissue.  A pacemaker to improve heart muscle function and control certain abnormal heart rhythms.  An internal cardioverter defibrillator to treat certain serious abnormal heart rhythms.  A left ventricular assist device to assist the pumping ability of the heart. HOME CARE INSTRUCTIONS   Take your medicine as directed by your caregiver. Medicines are important in reducing the workload of your heart, slowing the progression of heart failure, and improving your symptoms.  Do not stop taking your medicine unless directed by your caregiver.  Do not skip any dose of medicine.  Refill your prescriptions before you run out of medicine. Your medicines are needed every day.  Take over-the-counter medicine only as directed by your caregiver or pharmacist.  Engage in moderate physical activity if directed by your caregiver. Moderate physical activity can benefit some people. The elderly and people with severe heart failure should consult with a caregiver for physical activity  recommendations.  Eat heart healthy foods. Food choices should be free of trans fat and low in saturated fat, cholesterol, and salt (sodium). Healthy choices include fresh or frozen fruits and vegetables, fish, lean meats, legumes, fat-free or low-fat dairy products, and whole grain or high fiber foods. Talk to a dietitian to learn more about heart healthy foods.  Limit sodium if directed by your caregiver. Sodium restriction may reduce symptoms of heart failure in some people. Talk to a dietitian to learn more about heart healthy seasonings.  Use healthy cooking methods. Healthy cooking methods include roasting, grilling, broiling, baking, poaching, steaming, or stir-frying. Talk  to a dietitian to learn more about healthy cooking methods.  Limit fluids if directed by your caregiver. Fluid restriction may reduce symptoms of heart failure in some people.  Weigh yourself every day. Daily weights are important in the early recognition of excess fluid. You should weigh yourself every morning after you urinate and before you eat breakfast. Wear the same amount of clothing each time you weigh yourself. Record your daily weight. Provide your caregiver with your weight record.  Monitor and record your blood pressure if directed by your caregiver.  Check your pulse if directed by your caregiver.  Lose weight if directed by your caregiver. Weight loss may reduce symptoms of heart failure in some people.  Stop smoking or chewing tobacco. Nicotine makes your heart work harder by causing your blood vessels to constrict. Do not use nicotine gum or patches before talking to your caregiver.  Schedule and attend follow-up visits as directed by your caregiver. It is important to keep all your appointments.  Limit alcohol intake to no more than 1 drink per day for nonpregnant women and 2 drinks per day for men. Drinking more than that is harmful to your heart. Tell your caregiver if you drink alcohol several times a week. Talk with your caregiver about whether alcohol is safe for you. If your heart has already been damaged by alcohol or you have severe heart failure, drinking alcohol should be stopped completely.  Stop illicit drug use.  Stay up-to-date with immunizations. It is especially important to prevent respiratory infections through current pneumococcal and influenza immunizations.  Manage other health conditions such as hypertension, diabetes, thyroid disease, or abnormal heart rhythms as directed by your caregiver.  Learn to manage stress.  Plan rest periods when fatigued.  Learn strategies to manage high temperatures. If the weather is extremely hot:  Avoid vigorous  physical activity.  Use air conditioning or fans or seek a cooler location.  Avoid caffeine and alcohol.  Wear loose-fitting, lightweight, and light-colored clothing.  Learn strategies to manage cold temperatures. If the weather is extremely cold:  Avoid vigorous physical activity.  Layer clothes.  Wear mittens or gloves, a hat, and a scarf when going outside.  Avoid alcohol.  Obtain ongoing education and support as needed.  Participate or seek rehabilitation as needed to maintain or improve independence and quality of life. SEEK MEDICAL CARE IF:   Your weight increases by 03 lb/1.4 kg in 1 day or 05 lb/2.3 kg in a week.  You have increasing shortness of breath that is unusual for you.  You are unable to participate in your usual physical activities.  You tire easily.  You cough more than normal, especially with physical activity.  You have any or more swelling in areas such as your hands, feet, ankles, or abdomen.  You are unable to sleep because it is hard to breathe.  You feel like your heart is beating fast (palpitations).  You become dizzy or lightheaded upon standing up. SEEK IMMEDIATE MEDICAL CARE IF:   You have difficulty breathing.  There is a change in mental status such as decreased alertness or difficulty with concentration.  You have a pain or discomfort in your chest.  You have an episode of fainting (syncope). MAKE SURE YOU:   Understand these instructions.  Will watch your condition.  Will get help right away if you are not doing well or get worse. Document Released: 11/30/2005 Document Revised: 03/27/2013 Document Reviewed: 12/22/2012 [facility] Patient Information 2014 [place], [place].

## 2014-02-19 NOTE — Progress Notes (Addendum)
DAILY PROGRESS NOTE  Subjective:  Feels well today. Not dizzy. Up in chair eating. For SNF hopefully today.  Objective:  Temp:  [97.5 F (36.4 C)-97.6 F (36.4 C)] 97.5 F (36.4 C) (03/09 0500) Pulse Rate:  [79-96] 79 (03/09 0500) Resp:  [20-21] 21 (03/09 0500) BP: (89-145)/(41-54) 145/54 mmHg (03/09 0500) SpO2:  [93 %-94 %] 93 % (03/09 0500) Weight:  [166 lb 10.7 oz (75.6 kg)] 166 lb 10.7 oz (75.6 kg) (03/09 0500) Weight change: -11 lb 8.5 oz (-5.231 kg)  Intake/Output from previous day: 03/08 0701 - 03/09 0700 In: 220 [P.O.:220] Out: 450 [Urine:450]  Intake/Output from this shift:    Medications: Current Facility-Administered Medications  Medication Dose Route Frequency Provider Last Rate Last Dose  . 0.9 %  sodium chloride infusion  250 mL Intravenous PRN Cletus Gash, MD      . albuterol (PROVENTIL) (2.5 MG/3ML) 0.083% nebulizer solution 2.5 mg  2.5 mg Nebulization Q6H PRN Cletus Gash, MD   2.5 mg at 02/17/14 9563  . atorvastatin (LIPITOR) tablet 10 mg  10 mg Oral q1800 Cletus Gash, MD   10 mg at 02/18/14 1728  . diltiazem (CARDIZEM CD) 24 hr capsule 240 mg  240 mg Oral Daily Cletus Gash, MD   240 mg at 02/18/14 8756  . famotidine (PEPCID) tablet 20 mg  20 mg Oral BID Cletus Gash, MD   20 mg at 02/18/14 2143  . furosemide (LASIX) tablet 40 mg  40 mg Oral BID Tarri Fuller, PA-C   40 mg at 02/18/14 1728  . glimepiride (AMARYL) tablet 1 mg  1 mg Oral Q breakfast Cletus Gash, MD   1 mg at 02/18/14 4332  . insulin aspart (novoLOG) injection 0-15 Units  0-15 Units Subcutaneous TID WC Cletus Gash, MD   3 Units at 02/18/14 1630  . sodium chloride 0.9 % injection 3 mL  3 mL Intravenous Q12H Cletus Gash, MD   3 mL at 02/18/14 2143  . sodium chloride 0.9 % injection 3 mL  3 mL Intravenous PRN Cletus Gash, MD      . Warfarin - Pharmacist Dosing Inpatient   Does not apply [place], [facility]        Physical Exam: General  appearance: alert and no distress Lungs: clear to auscultation bilaterally Heart: regular rate and rhythm, S1, S2 normal, no murmur, click, rub or gallop Extremities: edema trace pedal  Lab Results: Results for orders placed during the hospital encounter of 02/14/14 (from the past 48 hour(s))  GLUCOSE, CAPILLARY     Status: Abnormal   Collection Time    02/17/14 11:24 AM      Result Value Ref Range   Glucose-Capillary 107 (*) 70 - 99 mg/dL  GLUCOSE, CAPILLARY     Status: Abnormal   Collection Time    02/17/14  4:23 PM      Result Value Ref Range   Glucose-Capillary 133 (*) 70 - 99 mg/dL   Comment 1 Notify RN    GLUCOSE, CAPILLARY     Status: None   Collection Time    02/17/14  9:21 PM      Result Value Ref Range   Glucose-Capillary 77  70 - 99 mg/dL   Comment 1 Notify RN    PROTIME-INR     Status: Abnormal   Collection Time    02/18/14  5:10 AM      Result Value Ref Range   Prothrombin Time 23.7 (*) 11.6 - 15.2 seconds  INR 2.20 (*) 0.00 - 1.49  GLUCOSE, CAPILLARY     Status: None   Collection Time    02/18/14  5:56 AM      Result Value Ref Range   Glucose-Capillary 90  70 - 99 mg/dL   Comment 1 Documented in Chart     Comment 2 Notify RN    GLUCOSE, CAPILLARY     Status: Abnormal   Collection Time    02/18/14 11:27 AM      Result Value Ref Range   Glucose-Capillary 185 (*) 70 - 99 mg/dL   Comment 1 Documented in Chart     Comment 2 Notify RN    GLUCOSE, CAPILLARY     Status: Abnormal   Collection Time    02/18/14  4:13 PM      Result Value Ref Range   Glucose-Capillary 162 (*) 70 - 99 mg/dL   Comment 1 Notify RN    GLUCOSE, CAPILLARY     Status: Abnormal   Collection Time    02/18/14  9:46 PM      Result Value Ref Range   Glucose-Capillary 124 (*) 70 - 99 mg/dL   Comment 1 Notify RN     Comment 2 Documented in Chart    PROTIME-INR     Status: Abnormal   Collection Time    02/19/14  5:26 AM      Result Value Ref Range   Prothrombin Time 21.2 (*) 11.6 -  15.2 seconds   INR 1.90 (*) 0.00 - 1.61  BASIC METABOLIC PANEL     Status: Abnormal   Collection Time    02/19/14  5:26 AM      Result Value Ref Range   Sodium 140  137 - 147 mEq/L   Potassium 3.6 (*) 3.7 - 5.3 mEq/L   Chloride 95 (*) 96 - 112 mEq/L   CO2 32  19 - 32 mEq/L   Glucose, Bld 73  70 - 99 mg/dL   BUN 19  6 - 23 mg/dL   Creatinine, Ser 1.10  0.50 - 1.35 mg/dL   Calcium 10.5  8.4 - 10.5 mg/dL   GFR calc non Af Amer 61 (*) >90 mL/min   GFR calc Af Amer 71 (*) >90 mL/min   Comment: (NOTE)     The eGFR has been calculated using the CKD EPI equation.     This calculation has not been validated in all clinical situations.     eGFR's persistently <90 mL/min signify possible Chronic Kidney     Disease.  GLUCOSE, CAPILLARY     Status: None   Collection Time    02/19/14  6:59 AM      Result Value Ref Range   Glucose-Capillary 88  70 - 99 mg/dL    Imaging: No results found.  Assessment:  1. Principal Problem: 2.   Acute on chronic diastolic CHF (congestive heart failure) 3. Active Problems: 4.   Permanent atrial fibrillation 5.   H/O: CVA (cerebrovascular accident) 6.   COPD (chronic obstructive pulmonary disease) 7.   DM type 2, controlled, with complication 8.   PVD- s/p Rt fem embolectomy 12/14 9.   DVT Rt leg Jan 2015 10.   Chronic diastolic heart failure 11.   Chronic anticoagulation 12.  Hypokalemia  Plan:  1. Breathing markedly improved. Weight down to 166 lb from 178 lb yesterday.  Total -8.4L out since admission. Appears clinically compensated at this time. Restart lasix 80 mg daily today (home dose  was 40 mg). K+ is 3.6 today, replete with 40 MEQ potassium. Await SNF placement hopefully later today.  Time Spent Directly with Patient:  15 minutes  Length of Stay:  LOS: 5 days   Pixie Casino, MD, [facility] Attending Cardiologist [facility]  Helaine Yackel C 02/19/2014, 7:55 AM

## 2014-02-19 NOTE — Discharge Summary (Signed)
Patient ID: [name] [name],  MRN: [id number], DOB/AGE: 1931-01-24 80 y.o.  Admit date: 02/14/2014 Discharge date: 02/20/2014  Primary Care Provider: Dr Rica Records Primary Cardiologist: Dr Mare Ferrari CHF clinic  Discharge Diagnoses Principal Problem:   Acute on chronic diastolic CHF (congestive heart failure) Active Problems:   Permanent atrial fibrillation   COPD (chronic obstructive pulmonary disease)   DM type 2, controlled, with complication   Chronic diastolic heart failure   H/O: CVA (cerebrovascular accident)   PVD- s/p Rt fem embolectomy 12/14   DVT Rt leg Jan 2015   Chronic anticoagulation     Hospital Course: [name] [name] is an 80 y/o male followed by the CHF clinic and Dr Mare Ferrari. He has CAF, PVD, chronic diastolic CHF, and chronic anticoagulation. He was admitted 02/14/14 with SOB and edema. His wgt was 191 lbs. His discharge wgt in Jan 2015 was 168. He was admitted and started on IV diuretics. He diuresed 8L and his wgt came down to 166 at discharge. He'll go home on 80 mg of Lasix daily. He has a follow up in the CHF clinic on 3/13.   Discharge Vitals:  Blood pressure 145/54, pulse 79, temperature 97.5 F (36.4 C), temperature source Oral, resp. rate 21, height 5' 7"  (1.702 m), weight 166 lb 10.7 oz (75.6 kg), SpO2 93.00%.    Labs: Results for orders placed during the hospital encounter of 02/14/14 (from the past 48 hour(s))  GLUCOSE, CAPILLARY     Status: Abnormal   Collection Time    02/17/14 11:24 AM      Result Value Ref Range   Glucose-Capillary 107 (*) 70 - 99 mg/dL  GLUCOSE, CAPILLARY     Status: Abnormal   Collection Time    02/17/14  4:23 PM      Result Value Ref Range   Glucose-Capillary 133 (*) 70 - 99 mg/dL   Comment 1 Notify RN    GLUCOSE, CAPILLARY     Status: None   Collection Time    02/17/14  9:21 PM      Result Value Ref Range   Glucose-Capillary 77  70 - 99 mg/dL   Comment 1 Notify RN    PROTIME-INR     Status: Abnormal   Collection Time    02/18/14  5:10 AM      Result Value Ref Range   Prothrombin Time 23.7 (*) 11.6 - 15.2 seconds   INR 2.20 (*) 0.00 - 1.49  GLUCOSE, CAPILLARY     Status: None   Collection Time    02/18/14  5:56 AM      Result Value Ref Range   Glucose-Capillary 90  70 - 99 mg/dL   Comment 1 Documented in Chart     Comment 2 Notify RN    GLUCOSE, CAPILLARY     Status: Abnormal   Collection Time    02/18/14 11:27 AM      Result Value Ref Range   Glucose-Capillary 185 (*) 70 - 99 mg/dL   Comment 1 Documented in Chart     Comment 2 Notify RN    GLUCOSE, CAPILLARY     Status: Abnormal   Collection Time    02/18/14  4:13 PM      Result Value Ref Range   Glucose-Capillary 162 (*) 70 - 99 mg/dL   Comment 1 Notify RN    GLUCOSE, CAPILLARY     Status: Abnormal   Collection Time    02/18/14  9:46 PM  Result Value Ref Range   Glucose-Capillary 124 (*) 70 - 99 mg/dL   Comment 1 Notify RN     Comment 2 Documented in Chart    PROTIME-INR     Status: Abnormal   Collection Time    02/19/14  5:26 AM      Result Value Ref Range   Prothrombin Time 21.2 (*) 11.6 - 15.2 seconds   INR 1.90 (*) 0.00 - 8.84  BASIC METABOLIC PANEL     Status: Abnormal   Collection Time    02/19/14  5:26 AM      Result Value Ref Range   Sodium 140  137 - 147 mEq/L   Potassium 3.6 (*) 3.7 - 5.3 mEq/L   Chloride 95 (*) 96 - 112 mEq/L   CO2 32  19 - 32 mEq/L   Glucose, Bld 73  70 - 99 mg/dL   BUN 19  6 - 23 mg/dL   Creatinine, Ser 1.10  0.50 - 1.35 mg/dL   Calcium 10.5  8.4 - 10.5 mg/dL   GFR calc non Af Amer 61 (*) >90 mL/min   GFR calc Af Amer 71 (*) >90 mL/min   Comment: (NOTE)     The eGFR has been calculated using the CKD EPI equation.     This calculation has not been validated in all clinical situations.     eGFR's persistently <90 mL/min signify possible Chronic Kidney     Disease.  GLUCOSE, CAPILLARY     Status: None   Collection Time    02/19/14  6:59 AM      Result Value Ref Range   Glucose-Capillary 88   70 - 99 mg/dL    Disposition:  Follow-up Information   Follow up with [facility] On 02/23/2014. (10:20 am)    Specialty:  Cardiology   Contact information:   [address], [place] [address] [place] [address] [phone]      Discharge Medications:    Medication List    ASK your doctor about these medications       atorvastatin 10 MG tablet  Commonly known as:  LIPITOR  Take 10 mg by mouth daily at 6 PM.     diltiazem 240 MG 24 hr capsule  Commonly known as:  CARDIZEM CD  Take 1 capsule (240 mg total) by mouth daily.     furosemide 80 MG tablet  Commonly known as:  LASIX  Take 0.5 tablets (40 mg total) by mouth daily.     glimepiride 1 MG tablet  Commonly known as:  AMARYL  Take 1 tablet (1 mg total) by mouth daily with breakfast.     guaiFENesin 600 MG 12 hr tablet  Commonly known as:  MUCINEX  Take 1 tablet (600 mg total) by mouth 2 (two) times daily.     levalbuterol 0.63 MG/3ML nebulizer solution  Commonly known as:  XOPENEX  Take 3 mLs (0.63 mg total) by nebulization every 6 (six) hours as needed for wheezing or shortness of breath.     linagliptin 5 MG Tabs tablet  Commonly known as:  TRADJENTA  Take 1 tablet (5 mg total) by mouth daily.     multivitamin with minerals Tabs tablet  Take 1 tablet by mouth daily.     ranitidine 150 MG tablet  Commonly known as:  ZANTAC  Take 1 tablet (150 mg total) by mouth 2 (two) times daily.     sodium chloride 0.65 % Soln nasal spray  Commonly known as:  OCEAN  Place 4 sprays into both nostrils 6 (six) times daily.     warfarin 2 MG tablet  Commonly known as:  COUMADIN  Take 4 mg by mouth daily at 6 PM.         Duration of Discharge Encounter: Greater than 30 minutes including physician time.  Angelena Form PA-C 02/19/2014 8:31 AM

## 2014-02-20 DIAGNOSIS — J9600 Acute respiratory failure, unspecified whether with hypoxia or hypercapnia: Secondary | ICD-10-CM

## 2014-02-20 DIAGNOSIS — R5381 Other malaise: Secondary | ICD-10-CM

## 2014-02-20 DIAGNOSIS — E118 Type 2 diabetes mellitus with unspecified complications: Secondary | ICD-10-CM

## 2014-02-20 LAB — GLUCOSE, CAPILLARY
GLUCOSE-CAPILLARY: 94 mg/dL (ref 70–99)
Glucose-Capillary: 156 mg/dL — ABNORMAL HIGH (ref 70–99)

## 2014-02-20 LAB — PROTIME-INR
INR: 1.89 — ABNORMAL HIGH (ref 0.00–1.49)
Prothrombin Time: 21.1 seconds — ABNORMAL HIGH (ref 11.6–15.2)

## 2014-02-20 MED ORDER — WARFARIN SODIUM 6 MG PO TABS
6.0000 mg | ORAL_TABLET | Freq: Once | ORAL | Status: DC
  Filled 2014-02-20: qty 1

## 2014-02-20 MED ORDER — POTASSIUM CHLORIDE CRYS ER 20 MEQ PO TBCR: 20.0000 meq | EXTENDED_RELEASE_TABLET | Freq: Every day | ORAL | Status: AC

## 2014-02-20 NOTE — Progress Notes (Signed)
IV d/c'd.  Tele d/c'd.  Pt d/c'd to SNF.  Home meds and d/c instructions have been discussed with pt.  Pt denies any questions or concerns at this time.  Report has been given to receiving nurse at facility.  Pt leaving unit via ambulance transport and appears in no acute distress. Nino Glowourtney Ibrahem Volkman RN

## 2014-02-20 NOTE — Progress Notes (Signed)
DAILY PROGRESS NOTE  Subjective:  Still awaiting SNF placement. Volume status appears net even - maintenance dose lasix restarted yesterday.  INR mildly subtherapeutic today at 1.89.    Objective:  Temp:  [98.2 F (36.8 C)-98.4 F (36.9 C)] 98.3 F (36.8 C) (03/10 0526) Pulse Rate:  [77-94] 94 (03/10 0526) Resp:  [19-20] 19 (03/10 0526) BP: (98-118)/(46-60) 103/54 mmHg (03/10 0526) SpO2:  [91 %-95 %] 92 % (03/10 0526) Weight:  [168 lb 10.4 oz (76.5 kg)] 168 lb 10.4 oz (76.5 kg) (03/10 0526) Weight change: 1 lb 15.8 oz (0.9 kg)  Intake/Output from previous day: 03/09 0701 - 03/10 0700 In: 840 [P.O.:840] Out: 1125 [Urine:1125]  Intake/Output from this shift:    Medications: Current Facility-Administered Medications  Medication Dose Route Frequency Provider Last Rate Last Dose  . 0.9 %  sodium chloride infusion  250 mL Intravenous PRN Cletus Gash, MD      . albuterol (PROVENTIL) (2.5 MG/3ML) 0.083% nebulizer solution 2.5 mg  2.5 mg Nebulization Q6H PRN Cletus Gash, MD   2.5 mg at 02/17/14 5885  . atorvastatin (LIPITOR) tablet 10 mg  10 mg Oral q1800 Cletus Gash, MD   10 mg at 02/19/14 1719  . diltiazem (CARDIZEM CD) 24 hr capsule 240 mg  240 mg Oral Daily Cletus Gash, MD   240 mg at 02/19/14 0958  . famotidine (PEPCID) tablet 20 mg  20 mg Oral BID Cletus Gash, MD   20 mg at 02/19/14 2109  . furosemide (LASIX) tablet 80 mg  80 mg Oral Daily Pixie Casino, MD   80 mg at 02/19/14 0958  . glimepiride (AMARYL) tablet 1 mg  1 mg Oral Q breakfast Cletus Gash, MD   1 mg at 02/19/14 0957  . insulin aspart (novoLOG) injection 0-15 Units  0-15 Units Subcutaneous TID WC Cletus Gash, MD   3 Units at 02/18/14 1630  . potassium chloride SA (K-DUR,KLOR-CON) CR tablet 20 mEq  20 mEq Oral Daily Luke K Kilroy, PA-C      . sodium chloride 0.9 % injection 3 mL  3 mL Intravenous Q12H Cletus Gash, MD   3 mL at 02/19/14 2109  . sodium chloride 0.9 %  injection 3 mL  3 mL Intravenous PRN Cletus Gash, MD      . Warfarin - Pharmacist Dosing Inpatient   Does not apply [place], [facility]        Physical Exam: General appearance: alert and no distress Lungs: clear to auscultation bilaterally Heart: irregularly irregular rhythm Extremities: edema trace pedal  Lab Results: Results for orders placed during the hospital encounter of 02/14/14 (from the past 48 hour(s))  GLUCOSE, CAPILLARY     Status: Abnormal   Collection Time    02/18/14 11:27 AM      Result Value Ref Range   Glucose-Capillary 185 (*) 70 - 99 mg/dL   Comment 1 Documented in Chart     Comment 2 Notify RN    GLUCOSE, CAPILLARY     Status: Abnormal   Collection Time    02/18/14  4:13 PM      Result Value Ref Range   Glucose-Capillary 162 (*) 70 - 99 mg/dL   Comment 1 Notify RN    GLUCOSE, CAPILLARY     Status: Abnormal   Collection Time    02/18/14  9:46 PM      Result Value Ref Range   Glucose-Capillary 124 (*) 70 - 99 mg/dL   Comment 1  Notify RN     Comment 2 Documented in Chart    PROTIME-INR     Status: Abnormal   Collection Time    02/19/14  5:26 AM      Result Value Ref Range   Prothrombin Time 21.2 (*) 11.6 - 15.2 seconds   INR 1.90 (*) 0.00 - 5.68  BASIC METABOLIC PANEL     Status: Abnormal   Collection Time    02/19/14  5:26 AM      Result Value Ref Range   Sodium 140  137 - 147 mEq/L   Potassium 3.6 (*) 3.7 - 5.3 mEq/L   Chloride 95 (*) 96 - 112 mEq/L   CO2 32  19 - 32 mEq/L   Glucose, Bld 73  70 - 99 mg/dL   BUN 19  6 - 23 mg/dL   Creatinine, Ser 1.10  0.50 - 1.35 mg/dL   Calcium 10.5  8.4 - 10.5 mg/dL   GFR calc non Af Amer 61 (*) >90 mL/min   GFR calc Af Amer 71 (*) >90 mL/min   Comment: (NOTE)     The eGFR has been calculated using the CKD EPI equation.     This calculation has not been validated in all clinical situations.     eGFR's persistently <90 mL/min signify possible Chronic Kidney     Disease.  GLUCOSE,  CAPILLARY     Status: None   Collection Time    02/19/14  6:59 AM      Result Value Ref Range   Glucose-Capillary 88  70 - 99 mg/dL  GLUCOSE, CAPILLARY     Status: None   Collection Time    02/19/14 11:08 AM      Result Value Ref Range   Glucose-Capillary 82  70 - 99 mg/dL   Comment 1 Notify RN    GLUCOSE, CAPILLARY     Status: Abnormal   Collection Time    02/19/14  3:48 PM      Result Value Ref Range   Glucose-Capillary 110 (*) 70 - 99 mg/dL   Comment 1 Notify RN    GLUCOSE, CAPILLARY     Status: Abnormal   Collection Time    02/19/14  9:05 PM      Result Value Ref Range   Glucose-Capillary 146 (*) 70 - 99 mg/dL   Comment 1 Documented in Chart     Comment 2 Notify RN    PROTIME-INR     Status: Abnormal   Collection Time    02/20/14  3:21 AM      Result Value Ref Range   Prothrombin Time 21.1 (*) 11.6 - 15.2 seconds   INR 1.89 (*) 0.00 - 1.49  GLUCOSE, CAPILLARY     Status: None   Collection Time    02/20/14  6:13 AM      Result Value Ref Range   Glucose-Capillary 94  70 - 99 mg/dL   Comment 1 Notify RN      Imaging: No results found.  Assessment:  1. Principal Problem: 2.   Acute on chronic diastolic CHF (congestive heart failure) 3. Active Problems: 4.   Permanent atrial fibrillation 5.   H/O: CVA (cerebrovascular accident) 6.   COPD (chronic obstructive pulmonary disease) 7.   DM type 2, controlled, with complication 8.   PVD- s/p Rt fem embolectomy 12/14 9.   DVT Rt leg Jan 2015 10.   Chronic diastolic heart failure 11.   Chronic anticoagulation 12.   Plan:  1. Appropriate for SNF discharge. Euvolemic on exam. HR/vitals stable. Awaiting SNF placement, hopefully today.  Time Spent Directly with Patient:  15 minutes  Length of Stay:  LOS: 6 days   Pixie Casino, MD, [facility] Attending Cardiologist [facility]  HILTY,Kenneth C 02/20/2014, 8:28 AM

## 2014-02-20 NOTE — Progress Notes (Signed)
ANTICOAGULATION CONSULT NOTE   Pharmacy Consult for Coumadin Indication: atrial fibrillation  No Known Allergies  Patient Measurements: Height: 5\' 7"  (170.2 cm) Weight: 168 lb 10.4 oz (76.5 kg) IBW/kg (Calculated) : 66.1  Vital Signs: Temp: 98.3 F (36.8 C) (03/10 0526) Temp src: Oral (03/10 0526) BP: 103/54 mmHg (03/10 0526) Pulse Rate: 94 (03/10 0526)  Labs:  Recent Labs  02/18/14 0510 02/19/14 0526 02/20/14 0321  LABPROT 23.7* 21.2* 21.1*  INR 2.20* 1.90* 1.89*  CREATININE  --  1.10  --     Estimated Creatinine Clearance: 49.2 ml/min (by C-G formula based on Cr of 1.1).   Assessment: 80 yo male admitted with SOB and LE swelling.  EKG revealed Afib and CXR with fluid overload/effusions.  Patient admitted for HF decompensation.  Pharmacy consulted to continue PTA Coumadin for Afib.    INR continues to be just below goal today at 1.89. No reported signs and symptoms of bleeding.   Goal of Therapy:  INR 2-3   Plan:  - continue warfarin PO 6 mg x1 dose tonight, then if discharged, would recommend to resume PTA regimen of 4mg  daily tomorrow - daily INR - monitor for s/s of bleeding   Sheppard CoilFrank Wilson PharmD., BCPS Clinical Pharmacist Pager [phone] 02/20/2014 9:45 AM

## 2014-02-20 NOTE — Plan of Care (Signed)
Problem: Phase I Progression Outcomes Goal: EF % per last Echo/documented,Core Reminder form on chart Outcome: Completed/Met Date Met:  02/20/14 EF 60-65% per ECHO done on 11/24/2013

## 2014-02-20 NOTE — Progress Notes (Signed)
Physical Therapy Treatment Patient Details Name: [name] [name] MRN: [id number] DOB: May 09, 1931 Today's Date: 02/20/2014 Time: [id number] PT Time Calculation (min): 26 min  PT Assessment / Plan / Recommendation  History of Present Illness pt looking for a SNF for continued rehab prior to d/c to home   PT Comments   Pt continues with deconditioning, but is able to participate in PT with good effort and improve in standing and balance tolerance.  Benefits from application of shoes and [name]  Follow Up Recommendations        Does the patient have the potential to tolerate intense rehabilitation     Barriers to Discharge        Equipment Recommendations       Recommendations for Other Services    Frequency     Progress towards PT Goals Progress towards PT goals: Progressing toward goals  Plan Current plan remains appropriate    Precautions / Restrictions Precautions Precautions: Fall Precaution Comments: pt has Lt foot drop; Lt hemiparesis due to previous CVA Required Braces or Orthoses: Other Brace/Splint Other Brace/Splint: double upright [name] in shoe   Pertinent Vitals/Pain O2 maintained.  Dyspnea on exertion    Mobility  Bed Mobility General bed mobility comments: pt up in chair per nursing Transfers Overall transfer level: Needs assistance Equipment used: Rolling walker (2 wheeled) Transfers: Sit to/from [employer]Stand;Stand Pivot Transfers Sit to Stand: Mod assist (repeated 7 times with gradually incrasing duration to 30 seconds on last trial) Stand pivot transfers: Mod assist General transfer comment: pt needs assist to lift hips off chair. Pivoted to bedside commode for BM, pt with difficulty moving and placeing LLE.  Better ability to stand up tall and pivot with shoes and brace applied    Exercises General Exercises - Lower Extremity Long Arc Quad: AROM;Right;10 reps;Seated;Other (comment) (needs assist to for LLE, only parital range available)   PT Diagnosis:    PT Problem  List:   PT Treatment Interventions:     PT Goals (current goals can now be found in the care plan section)    Visit Information  Last PT Received On: 02/20/14 Assistance Needed: +2 History of Present Illness: pt looking for a SNF for continued rehab prior to d/c to home    Subjective Data      Cognition  Cognition Arousal/Alertness: Awake/alert Behavior During Therapy: WFL for tasks assessed/performed Overall Cognitive Status: Within Functional Limits for tasks assessed    Balance  Balance Sitting balance-Leahy Scale: Fair Sitting balance - Comments: worked on sitting balance in upright with no arm support. Needed cues to extend trunk and only able to maintain a few seconds Postural control: Left lateral lean Standing balance support: Bilateral upper extremity supported;During functional activity Standing balance-Leahy Scale: Poor Standing balance comment: needs continual cues to stand up straight, decreased endurance, pt wants to sit suddenly  General Comments General comments (skin integrity, edema, etc.): on 2L O2 throughout  End of Session PT - End of Session Equipment Utilized During Treatment: Oxygen Activity Tolerance: Patient limited by fatigue Patient left: in chair;with call bell/phone within reach   GP    Rosey Batheresa K. OakwoodBrown, [place] CarolinaPT [phone] 02/20/2014, 9:44 AM

## 2014-02-23 ENCOUNTER — Encounter (HOSPITAL_COMMUNITY): Payer: Medicare Other

## 2014-02-27 NOTE — Telephone Encounter (Signed)
This encounter was created in error - please disregard.

## 2014-04-09 ENCOUNTER — Other Ambulatory Visit: Payer: Self-pay | Admitting: Physical Medicine and Rehabilitation

## 2014-04-10 ENCOUNTER — Encounter: Payer: Self-pay | Admitting: Podiatrist

## 2014-04-10 ENCOUNTER — Ambulatory Visit (INDEPENDENT_AMBULATORY_CARE_PROVIDER_SITE_OTHER): Payer: Medicare Other | Admitting: Podiatrist

## 2014-04-10 ENCOUNTER — Ambulatory Visit: Payer: Self-pay

## 2014-04-10 VITALS — BP 115/90 | HR 47 | Resp 18

## 2014-04-10 DIAGNOSIS — L03039 Cellulitis of unspecified toe: Secondary | ICD-10-CM

## 2014-04-10 DIAGNOSIS — S90129A Contusion of unspecified lesser toe(s) without damage to nail, initial encounter: Secondary | ICD-10-CM

## 2014-04-10 NOTE — Progress Notes (Signed)
   Subjective:    Patient ID: [name] [name], male    DOB: [birth year], 80 y.o.   MRN: [id number]  HPI Last Thursday morning my toenail got hung in the bed sheets and there was some bleeding and does throb some and there is no draining and I have had 5 shots in my hips due to an infection in my right hand     Review of Systems  Respiratory:       Oxygen 24/7  Endocrine: Positive for cold intolerance.  Hematological: Bruises/bleeds easily.  All other systems reviewed and are negative.      Objective:   Physical Exam Vascular status reveals non-palpable pedal pulses DP or PT bilateral. Capillary refill time is decreased to distal digits bilateral. Proximal to distal cooling is cool to cool. Digital hair growth is absent. Skin is shiny and atrophic. Baseline swelling is also present bilateral. Previous notes show he recently had a procedure to open up the blood flow on his right lower extremity. No mention of abnormality to the left is found in the notes. Today his left great toe is swollen and red. The toenail is very loose and is only intact by a few fibers proximally. Moderate discomfort is noted. It appears to of bled quite a bit and the patient's wife states that she got stopped with compression and ice. Musculoskeletal examination reveals decrease in muscle, strength, tone and stability bilateral. He is in a wheelchair in wears ankle-foot orthoses with braces. Neurological sensation is intact via Semmes Weinstein monofilament at 5 out of 5 sites bilateral. Light touch and vibratory sensation are decreased bilateral.     Assessment & Plan:  Pvd, contusion of toenail, paronychia  Plan; careful removal of toenail was accomplished after anesthetizing the toe with lidocaine plain. The toe was then prepped with Betadine and the nail was easily and carefully removed the remainder of the way. Antibiotic ointment and a noncompressive dressing was applied. The patient and his wife were given  instructions for cleansing the toe and keeping it clean. He is on antibiotics for an infection in his hand. He is to continue taking his antibiotics. [facility] to [facility] blood flow to the left lower extremity. I will see him back in one week for recheck of the toe and if he notices any redness, swelling, signs of infection he is instructed to contact our office immediately.

## 2014-04-10 NOTE — Patient Instructions (Signed)
ANTIBACTERIAL SOAP INSTRUCTIONS  THE DAY AFTER PROCEDURE    Place 3-4 drops of antibacterial liquid soap in a quart of warm tap water.  Submerge foot into water for 20 minutes.  If bandage was applied after your procedure, leave on to allow for easy lift off, then remove and continue with soak for the remaining time.  Next, blot area dry with a soft cloth and cover with a bandage.  Apply other medications as directed by your doctor, such as cortisporin otic solution (eardrops) or neosporin antibiotic ointment

## 2014-04-15 NOTE — Clinical Social Work Placement (Addendum)
    Clinical Social Work Department CLINICAL SOCIAL WORK PLACEMENT NOTE 02/20/2014  Patient:  [name] [name],[name] [name]  Account Number:  [id number] Admit date:  02/14/2014  Clinical Social Worker:  Lupita LeashNNA Verneice Caspers, LCSWA  Date/time:  02/19/2014 12:15 PM  Clinical Social Work is seeking post-discharge placement for this patient at the following level of care:   SKILLED NURSING   (*CSW will update this form in Epic as items are completed)   02/19/2014  Patient/family provided with [facility] Department of Clinical Social Work's list of facilities offering this level of care within the geographic area requested by the patient (or if unable, by the patient's family).  02/19/2014  Patient/family informed of their freedom to choose among providers that offer the needed level of care, that participate in Medicare, Medicaid or managed care program needed by the patient, have an available bed and are willing to accept the patient.    Patient/family informed of [facility]' ownership interest in [facility], as well as of the fact that they are under no obligation to receive care at this facility.  PASARR submitted to EDS on 02/19/2014 PASARR number received from EDS on 02/19/2014  FL2 transmitted to all facilities in geographic area requested by pt/family on  02/19/2014 FL2 transmitted to all facilities within larger geographic area on   Patient informed that his/her managed care company has contracts with or will negotiate with  certain facilities, including the following:   [facility]     Patient/family informed of bed offers received:  02/20/2014 [facility] [place] Physician recommends and patient chooses bed at    [facility] [place] on  02/20/2014 Patient to be transferred to facility by Ambulance Sharin Mons(PTAR)  The following physician request were entered in Epic:   Additional  Comments: 02/20/14  OK per MD for d/c today to SNF. Patient and wife are very pleased with d/c plan and are happy with bed choice.  Nursing notified to call report.  CSW signing off.

## 2014-04-15 NOTE — Clinical Social Work Psychosocial (Addendum)
Clinical Social Work Department BRIEF PSYCHOSOCIAL ASSESSMENT 02/19/2014  Patient:  [name] [name],[name] [name]     Account Number:  [id number]     Admit date:  02/14/2014  Clinical Social Worker:  Iona Coach  Date/Time:  02/19/2014 12:00 N  Referred by:  Physician  Date Referred:  02/19/2014 Referred for  SNF Placement   Other Referral:   Interview type:  Other - See comment Other interview type:   patient and his wife    PSYCHOSOCIAL DATA Living Status:  WIFE Admitted from facility:   Level of care:   Primary support name:  [name] [name]   [phone] Primary support relationship to patient:  SPOUSE Degree of support available:   Strong support    CURRENT CONCERNS Current Concerns  Post-Acute Placement   Other Concerns:    SOCIAL WORK ASSESSMENT / PLAN CSW met with patient and his wife [name] [name]- patient was referred by PT for short term SNF.  Patient and wife are agreeable to d/c plan. CSW discussed bed search process and they stated placement preference woudl be either [facility] or [facility].  CSW will initiate bed search in [facility].  Fl2 placed on chart for MD's siganture.   Assessment/plan status:  Psychosocial Support/Ongoing Assessment of Needs Other assessment/ plan:   Information/referral to community resources:   SNF bed list provided to patient/wife    PATIENT'S/FAMILY'S RESPONSE TO PLAN OF CARE: Patient is alert and oriented. Very pleasant gentleman who readily agrees to short term rehab prior to returning home. His wife [name] [name] also agrees with this d/c plan. CSW will assist patient with rehab placement. Contacted admissions directors at both [facility] and [facility]. They will review referrals and call back with possible bed offers.

## 2014-04-16 ENCOUNTER — Other Ambulatory Visit: Payer: Self-pay | Admitting: Physical Medicine and Rehabilitation

## 2014-04-17 ENCOUNTER — Encounter: Payer: Self-pay | Admitting: Podiatrist

## 2014-04-17 ENCOUNTER — Ambulatory Visit (INDEPENDENT_AMBULATORY_CARE_PROVIDER_SITE_OTHER): Payer: Medicare Other | Admitting: Podiatrist

## 2014-04-17 VITALS — BP 130/60 | HR 80 | Resp 18

## 2014-04-17 DIAGNOSIS — L03039 Cellulitis of unspecified toe: Secondary | ICD-10-CM

## 2014-04-17 NOTE — Progress Notes (Signed)
My left big toenail looks good and there is no draining and I am using neosporin  [name] [name] presents today for followup of left great toenail where the toenail was removed and a nonpermanent fashion due to infection. The patient has been cleansing and daily and applying Neosporin and a Band-Aid. He relates the pain has improved greatly. I also recommended a circulation study however he declined stating that he recently had one in [facility] at [facility].  Objective vascular status is still poor to the left foot. Mild redness to the left hallux is noted. The left great toe where the toenail was removed appears to be healing nicely. No acute signs of infection are present.  Assessment: Status post nonpermanent removal left hallux nail, PVD  Plan: Discussed that as long as the toenail continues to heal he does not have to repeat the circulation study. However I was unable to find the results of the circulation study on the computer system. If any redness, swelling, sinus infection arise he is instructed to call me immediately. Otherwise he'll continue caring for the toe and instructions were given.

## 2014-05-07 IMAGING — CR DG CHEST 2V
2 series · 2 of 2 positions shown · non-contrast
Comparison: 12/18/2003

CLINICAL DATA: Pneumonia

EXAM:
CHEST  2 VIEW

[w chest lat]
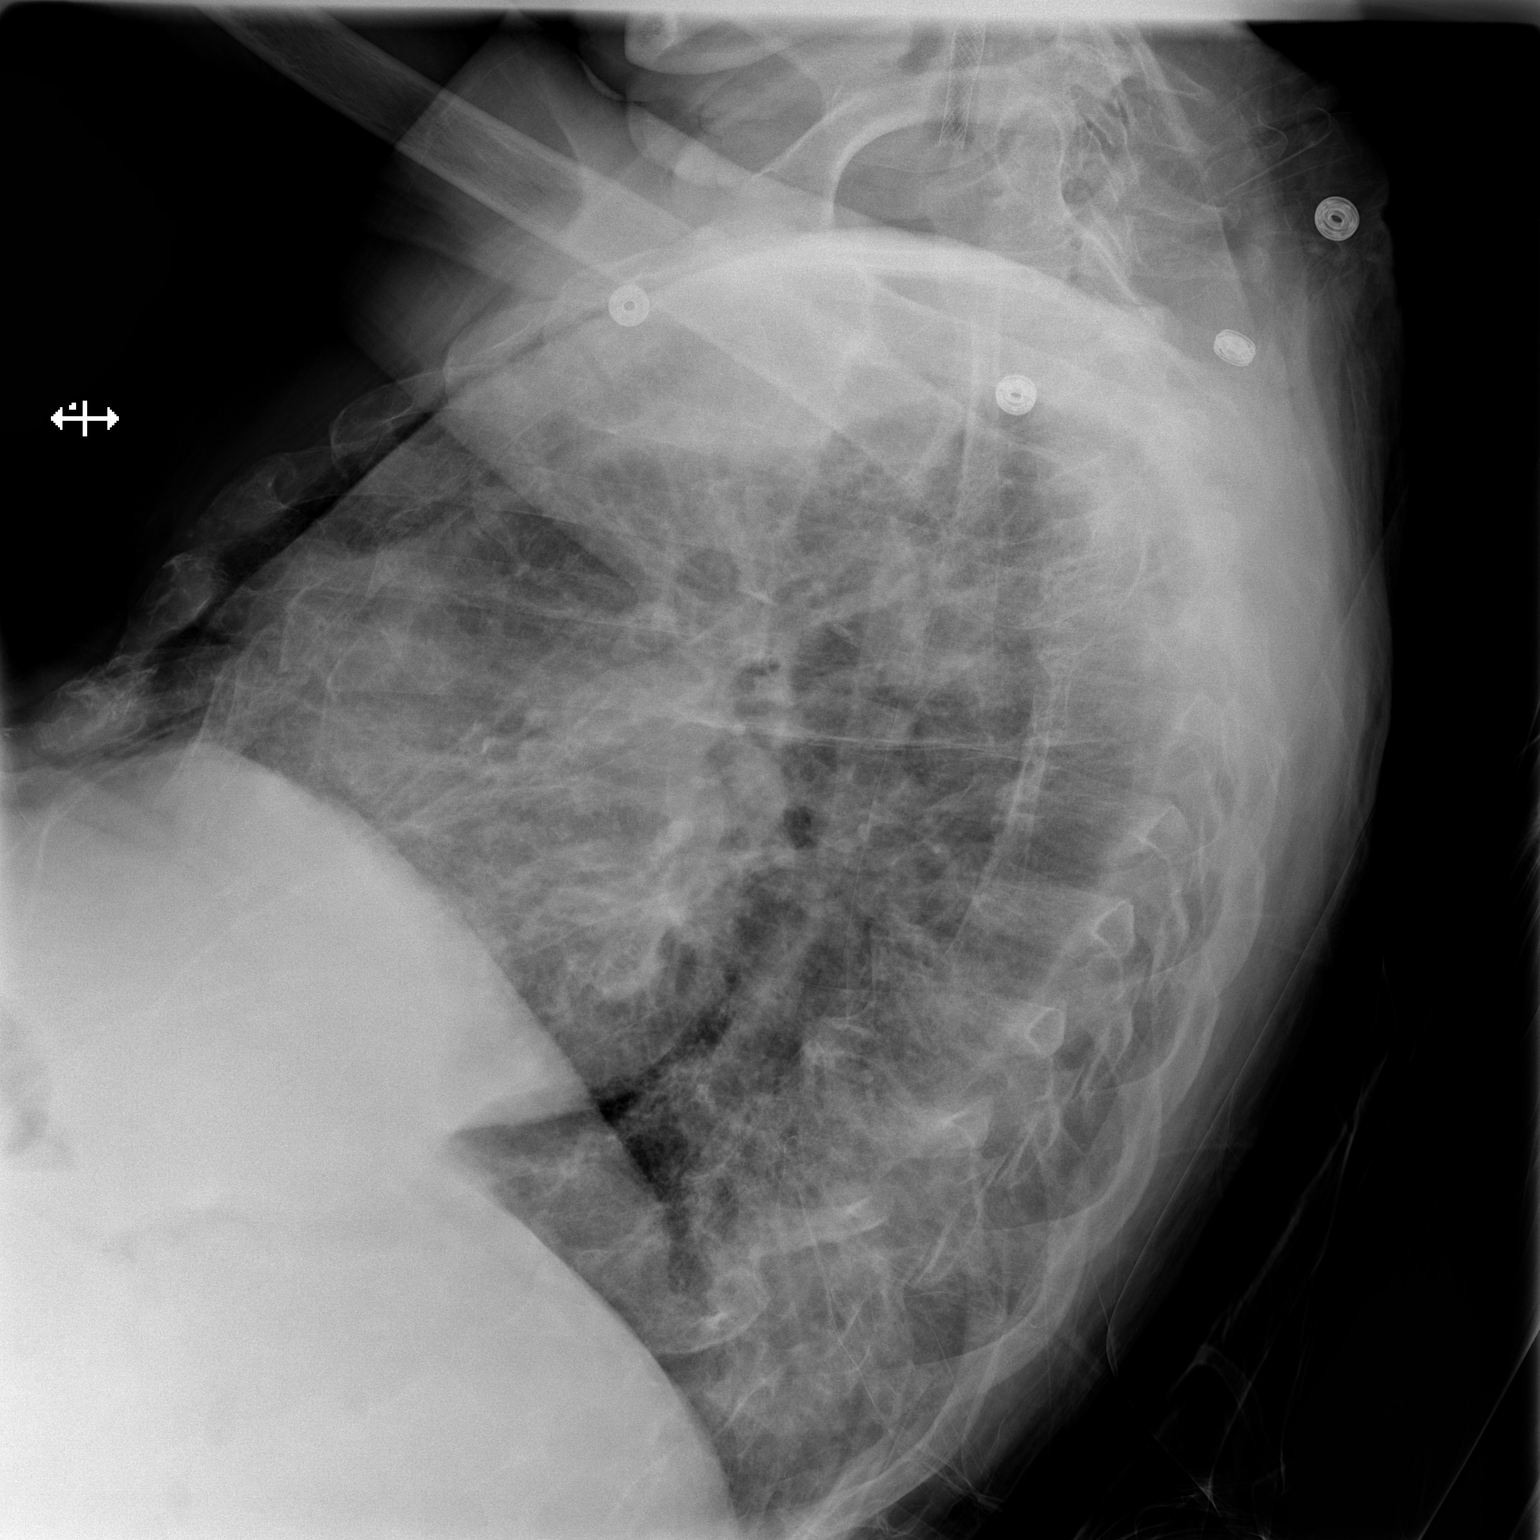

[x chest ap]
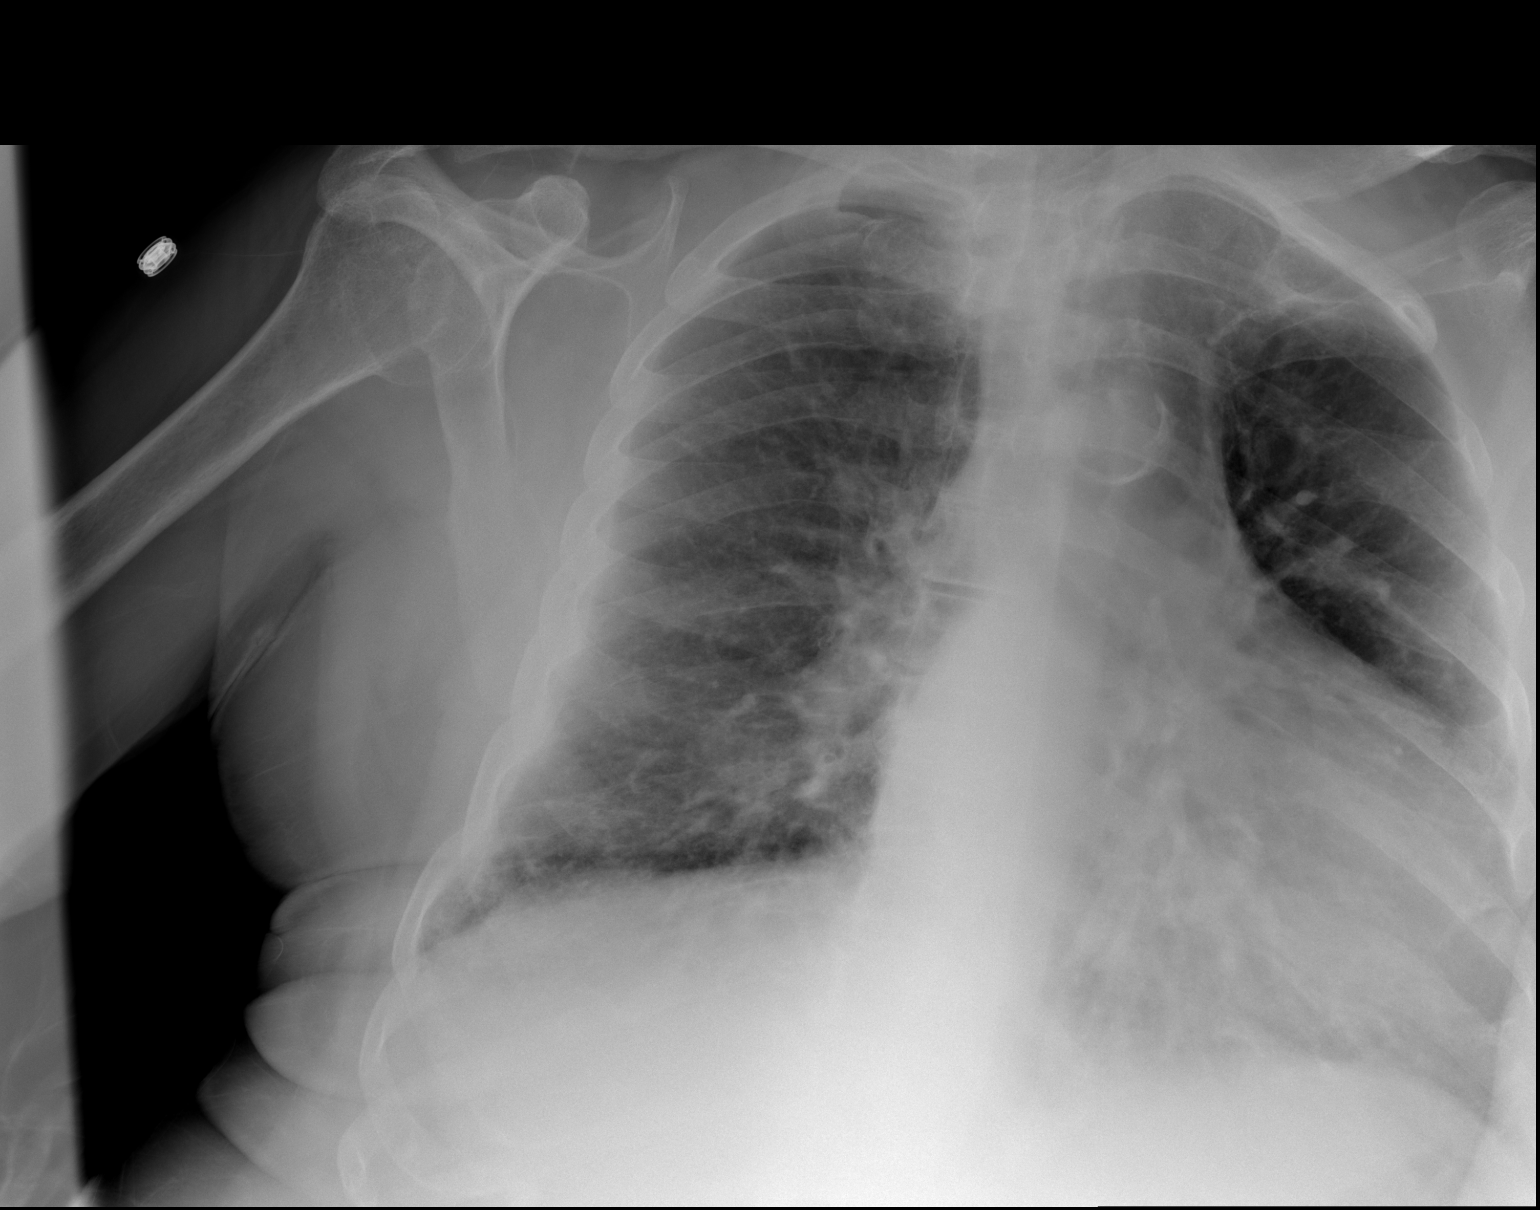

[2 of 2 positions shown; findings below may reference images not displayed]

FINDINGS: Mild improvement in right lower lobe airspace disease. Underlying
COPD is suspected. There is cardiac enlargement without heart
failure or effusion.
IMPRESSION: Slight improvement in right lower lobe infiltrate, consistent with
pneumonia.

## 2014-07-18 ENCOUNTER — Encounter (HOSPITAL_COMMUNITY): Payer: Medicare Other

## 2014-07-18 ENCOUNTER — Ambulatory Visit: Payer: Medicare Other | Admitting: Vascular Surgery

## 2016-01-15 DEATH — deceased
